# Patient Record
Sex: Female | Born: 1950 | Race: White | Hispanic: No | Marital: Married | Smoking: Never smoker
Health system: Southern US, Community
[De-identification: ages and names within clinical notes are randomized; demographics above are authoritative.]

## PROBLEM LIST (undated history)

## (undated) DIAGNOSIS — Z800 Family history of malignant neoplasm of digestive organs: Secondary | ICD-10-CM

## (undated) DIAGNOSIS — K5900 Constipation, unspecified: Secondary | ICD-10-CM

## (undated) DIAGNOSIS — J45909 Unspecified asthma, uncomplicated: Secondary | ICD-10-CM

## (undated) DIAGNOSIS — Z808 Family history of malignant neoplasm of other organs or systems: Secondary | ICD-10-CM

## (undated) DIAGNOSIS — E349 Endocrine disorder, unspecified: Secondary | ICD-10-CM

## (undated) DIAGNOSIS — Z1211 Encounter for screening for malignant neoplasm of colon: Secondary | ICD-10-CM

## (undated) DIAGNOSIS — E785 Hyperlipidemia, unspecified: Secondary | ICD-10-CM

## (undated) DIAGNOSIS — K219 Gastro-esophageal reflux disease without esophagitis: Secondary | ICD-10-CM

## (undated) DIAGNOSIS — R7989 Other specified abnormal findings of blood chemistry: Secondary | ICD-10-CM

## (undated) DIAGNOSIS — E669 Obesity, unspecified: Secondary | ICD-10-CM

## (undated) DIAGNOSIS — Z8719 Personal history of other diseases of the digestive system: Secondary | ICD-10-CM

## (undated) DIAGNOSIS — E559 Vitamin D deficiency, unspecified: Secondary | ICD-10-CM

## (undated) DIAGNOSIS — Z9221 Personal history of antineoplastic chemotherapy: Secondary | ICD-10-CM

## (undated) DIAGNOSIS — R112 Nausea with vomiting, unspecified: Secondary | ICD-10-CM

## (undated) DIAGNOSIS — J449 Chronic obstructive pulmonary disease, unspecified: Secondary | ICD-10-CM

## (undated) DIAGNOSIS — C801 Malignant (primary) neoplasm, unspecified: Secondary | ICD-10-CM

## (undated) DIAGNOSIS — M1990 Unspecified osteoarthritis, unspecified site: Secondary | ICD-10-CM

## (undated) DIAGNOSIS — F5104 Psychophysiologic insomnia: Secondary | ICD-10-CM

## (undated) DIAGNOSIS — C419 Malignant neoplasm of bone and articular cartilage, unspecified: Secondary | ICD-10-CM

## (undated) DIAGNOSIS — M549 Dorsalgia, unspecified: Secondary | ICD-10-CM

## (undated) DIAGNOSIS — Z8711 Personal history of peptic ulcer disease: Secondary | ICD-10-CM

## (undated) DIAGNOSIS — R945 Abnormal results of liver function studies: Secondary | ICD-10-CM

## (undated) DIAGNOSIS — Z801 Family history of malignant neoplasm of trachea, bronchus and lung: Secondary | ICD-10-CM

## (undated) DIAGNOSIS — R1032 Left lower quadrant pain: Secondary | ICD-10-CM

## (undated) DIAGNOSIS — K811 Chronic cholecystitis: Secondary | ICD-10-CM

## (undated) DIAGNOSIS — Z9889 Other specified postprocedural states: Secondary | ICD-10-CM

## (undated) DIAGNOSIS — Z8701 Personal history of pneumonia (recurrent): Secondary | ICD-10-CM

## (undated) DIAGNOSIS — I10 Essential (primary) hypertension: Secondary | ICD-10-CM

## (undated) DIAGNOSIS — E119 Type 2 diabetes mellitus without complications: Secondary | ICD-10-CM

## (undated) DIAGNOSIS — M5030 Other cervical disc degeneration, unspecified cervical region: Secondary | ICD-10-CM

## (undated) DIAGNOSIS — D649 Anemia, unspecified: Secondary | ICD-10-CM

## (undated) DIAGNOSIS — K5792 Diverticulitis of intestine, part unspecified, without perforation or abscess without bleeding: Secondary | ICD-10-CM

## (undated) DIAGNOSIS — J189 Pneumonia, unspecified organism: Secondary | ICD-10-CM

## (undated) HISTORY — PX: OTHER SURGICAL HISTORY: SHX169

## (undated) HISTORY — DX: Type 2 diabetes mellitus without complications: E11.9

## (undated) HISTORY — DX: Left lower quadrant pain: R10.32

## (undated) HISTORY — DX: Obesity, unspecified: E66.9

## (undated) HISTORY — DX: Nausea with vomiting, unspecified: R11.2

## (undated) HISTORY — DX: Family history of malignant neoplasm of digestive organs: Z80.0

## (undated) HISTORY — PX: HAND SURGERY: SHX662

## (undated) HISTORY — DX: Endocrine disorder, unspecified: E34.9

## (undated) HISTORY — PX: NASAL SINUS SURGERY: SHX719

## (undated) HISTORY — PX: BREAST EXCISIONAL BIOPSY: SUR124

## (undated) HISTORY — DX: Dorsalgia, unspecified: M54.9

## (undated) HISTORY — DX: Family history of malignant neoplasm of other organs or systems: Z80.8

## (undated) HISTORY — DX: Chronic cholecystitis: K81.1

## (undated) HISTORY — DX: Hyperlipidemia, unspecified: E78.5

## (undated) HISTORY — DX: Encounter for screening for malignant neoplasm of colon: Z12.11

## (undated) HISTORY — PX: MASTECTOMY: SHX3

## (undated) HISTORY — DX: Gastro-esophageal reflux disease without esophagitis: K21.9

## (undated) HISTORY — PX: BILATERAL CARPAL TUNNEL RELEASE: SHX6508

## (undated) HISTORY — DX: Essential (primary) hypertension: I10

## (undated) HISTORY — PX: TONSILLECTOMY: SUR1361

## (undated) HISTORY — DX: Family history of malignant neoplasm of trachea, bronchus and lung: Z80.1

## (undated) HISTORY — PX: JOINT REPLACEMENT: SHX530

## (undated) HISTORY — DX: Constipation, unspecified: K59.00

## (undated) HISTORY — PX: COLON SURGERY: SHX602

## (undated) HISTORY — DX: Malignant neoplasm of bone and articular cartilage, unspecified: C41.9

---

## 1976-01-19 HISTORY — PX: TUBAL LIGATION: SHX77

## 1985-01-18 HISTORY — PX: VAGINAL HYSTERECTOMY: SUR661

## 1996-01-19 HISTORY — PX: SALPINGOOPHORECTOMY: SHX82

## 2000-01-19 DIAGNOSIS — I10 Essential (primary) hypertension: Secondary | ICD-10-CM

## 2000-01-19 HISTORY — DX: Essential (primary) hypertension: I10

## 2001-01-18 HISTORY — PX: BUNIONECTOMY: SHX129

## 2002-01-18 HISTORY — PX: COLOSTOMY REVERSAL: SHX5782

## 2002-01-18 HISTORY — PX: COLOSTOMY: SHX63

## 2002-01-18 HISTORY — PX: COLON RESECTION: SHX5231

## 2003-01-19 DIAGNOSIS — E349 Endocrine disorder, unspecified: Secondary | ICD-10-CM

## 2003-01-19 DIAGNOSIS — K5900 Constipation, unspecified: Secondary | ICD-10-CM

## 2003-01-19 DIAGNOSIS — M549 Dorsalgia, unspecified: Secondary | ICD-10-CM

## 2003-01-19 HISTORY — DX: Dorsalgia, unspecified: M54.9

## 2003-01-19 HISTORY — DX: Constipation, unspecified: K59.00

## 2003-01-19 HISTORY — PX: COLONOSCOPY: SHX174

## 2003-01-19 HISTORY — PX: HERNIA REPAIR: SHX51

## 2003-01-19 HISTORY — DX: Endocrine disorder, unspecified: E34.9

## 2003-06-13 ENCOUNTER — Other Ambulatory Visit: Payer: Self-pay

## 2003-12-04 ENCOUNTER — Ambulatory Visit: Payer: Self-pay | Admitting: General Surgery

## 2004-01-16 ENCOUNTER — Ambulatory Visit: Payer: Self-pay | Admitting: General Surgery

## 2005-02-06 ENCOUNTER — Emergency Department: Payer: Self-pay | Admitting: Emergency Medicine

## 2005-02-09 ENCOUNTER — Inpatient Hospital Stay: Payer: Self-pay | Admitting: Endocrinology

## 2005-03-11 ENCOUNTER — Ambulatory Visit: Payer: Self-pay | Admitting: General Surgery

## 2005-03-12 ENCOUNTER — Ambulatory Visit: Payer: Self-pay | Admitting: General Surgery

## 2005-03-31 ENCOUNTER — Ambulatory Visit: Payer: Self-pay | Admitting: Endocrinology

## 2005-04-13 ENCOUNTER — Ambulatory Visit: Payer: Self-pay | Admitting: General Surgery

## 2006-06-01 ENCOUNTER — Ambulatory Visit: Payer: Self-pay | Admitting: Endocrinology

## 2007-06-20 ENCOUNTER — Ambulatory Visit: Payer: Self-pay | Admitting: Endocrinology

## 2008-06-25 ENCOUNTER — Ambulatory Visit: Payer: Self-pay | Admitting: Endocrinology

## 2009-07-09 ENCOUNTER — Ambulatory Visit: Payer: Self-pay | Admitting: Endocrinology

## 2010-01-18 DIAGNOSIS — E785 Hyperlipidemia, unspecified: Secondary | ICD-10-CM

## 2010-01-18 DIAGNOSIS — K811 Chronic cholecystitis: Secondary | ICD-10-CM

## 2010-01-18 DIAGNOSIS — K219 Gastro-esophageal reflux disease without esophagitis: Secondary | ICD-10-CM

## 2010-01-18 DIAGNOSIS — R112 Nausea with vomiting, unspecified: Secondary | ICD-10-CM

## 2010-01-18 DIAGNOSIS — Z1211 Encounter for screening for malignant neoplasm of colon: Secondary | ICD-10-CM

## 2010-01-18 DIAGNOSIS — E669 Obesity, unspecified: Secondary | ICD-10-CM

## 2010-01-18 DIAGNOSIS — R1032 Left lower quadrant pain: Secondary | ICD-10-CM

## 2010-01-18 HISTORY — DX: Left lower quadrant pain: R10.32

## 2010-01-18 HISTORY — DX: Chronic cholecystitis: K81.1

## 2010-01-18 HISTORY — DX: Encounter for screening for malignant neoplasm of colon: Z12.11

## 2010-01-18 HISTORY — DX: Gastro-esophageal reflux disease without esophagitis: K21.9

## 2010-01-18 HISTORY — PX: FOOT SURGERY: SHX648

## 2010-01-18 HISTORY — DX: Hyperlipidemia, unspecified: E78.5

## 2010-01-18 HISTORY — DX: Obesity, unspecified: E66.9

## 2010-01-18 HISTORY — DX: Nausea with vomiting, unspecified: R11.2

## 2010-04-16 ENCOUNTER — Ambulatory Visit: Payer: Self-pay | Admitting: General Surgery

## 2010-04-23 ENCOUNTER — Ambulatory Visit: Payer: Self-pay | Admitting: General Surgery

## 2010-05-08 ENCOUNTER — Ambulatory Visit: Payer: Self-pay | Admitting: General Surgery

## 2010-06-04 ENCOUNTER — Ambulatory Visit: Payer: Self-pay | Admitting: General Surgery

## 2010-06-04 HISTORY — PX: CHOLECYSTECTOMY: SHX55

## 2010-07-23 ENCOUNTER — Ambulatory Visit: Payer: Self-pay | Admitting: Endocrinology

## 2011-08-04 ENCOUNTER — Ambulatory Visit: Payer: Self-pay | Admitting: Endocrinology

## 2012-04-21 ENCOUNTER — Encounter: Payer: Self-pay | Admitting: *Deleted

## 2012-04-26 ENCOUNTER — Encounter: Payer: Self-pay | Admitting: General Surgery

## 2012-04-26 ENCOUNTER — Ambulatory Visit (INDEPENDENT_AMBULATORY_CARE_PROVIDER_SITE_OTHER): Payer: 59 | Admitting: General Surgery

## 2012-04-26 VITALS — BP 118/84 | HR 60 | Resp 14 | Ht 66.0 in | Wt 248.0 lb

## 2012-04-26 DIAGNOSIS — Z1211 Encounter for screening for malignant neoplasm of colon: Secondary | ICD-10-CM

## 2012-04-26 MED ORDER — POLYETHYLENE GLYCOL 3350 17 GM/SCOOP PO POWD: ORAL | Status: DC

## 2012-04-26 NOTE — Progress Notes (Signed)
Patient ID: [name], female   DOB: Dec 08, 1950, 62 y.o.   MRN: [id number]  Chief Complaint  Patient presents with  . Other    due for colonoscopy    HPI Megan Harris is a 62 y.o. female who presents for a colonoscopy. The last colonoscopy was performed 10 years ago. She states no problems at this time.  HPI  Past Medical History  Diagnosis Date  . Constipation 2005  . Diabetes mellitus without complication 2007    type 2  . Hypertension 2002  . Chronic cholecystitis 2012  . Endocrine problem 2005  . GERD (gastroesophageal reflux disease) 2012  . Thyroid disease 2005  . Nausea with vomiting 2012  . Breast screening, unspecified 2012  . Special screening for malignant neoplasms, colon 2012  . Obesity, unspecified 2012  . Abdominal pain, left lower quadrant 2012  . Hyperlipidemia 2012  . Back pain 2005    Past Surgical History  Procedure Laterality Date  . Cholecystectomy  06/04/2010  . Foot surgery Bilateral 2012    plantar faciatis  . Hernia repair  2005    at colostomy site after reversal done  . Colostomy  2004  . Colostomy reversal  2004  . Colon resection  2004  . Tonsillectomy    . Salpingoophorectomy  1998  . Hand surgery    . Nasal sinus surgery      2005  . Abdominal hysterectomy  1977  . Bunionectomy Bilateral 2003  . Colonoscopy  2005    [facility], Dr. Lemar Livings    History reviewed. No pertinent family history.  Social History History  Substance Use Topics  . Smoking status: Never Smoker   . Smokeless tobacco: Not on file  . Alcohol Use: Yes     Comment: socially    Allergies  Allergen Reactions  . Codeine Other (See Comments)    Breathing problems  . Penicillins Hives    Current Outpatient Prescriptions  Medication Sig Dispense Refill  . lansoprazole (PREVACID) 30 MG capsule Take 30 mg by mouth 2 (two) times daily.       Marland Kitchen lisinopril-hydrochlorothiazide (PRINZIDE,ZESTORETIC) 20-12.5 MG per tablet Take 1 tablet by mouth daily.      .  metFORMIN (GLUCOPHAGE) 1000 MG tablet Take 1,000 mg by mouth daily.      . polyethylene glycol powder (GLYCOLAX/MIRALAX) powder 255 grams one bottle for colonoscopy prep  255 g  0  . Vitamin D, Ergocalciferol, (DRISDOL) 50000 UNITS CAPS Take 1 capsule by mouth once a week.      . zolpidem (AMBIEN) 10 MG tablet Take 10 mg by mouth at bedtime as needed for sleep.       No current facility-administered medications for this visit.    Review of Systems Review of Systems  Constitutional: Negative.   Respiratory: Negative.   Cardiovascular: Negative.   Gastrointestinal: Negative.     Blood pressure 118/84, pulse 60, resp. rate 14, height 5\' 6"  (1.676 m), weight 248 lb (112.492 kg).  Physical Exam Physical Exam  Constitutional: She appears well-developed and well-nourished.  Neck: Trachea normal. No mass and no thyromegaly present.  Cardiovascular: Normal rate, regular rhythm, normal heart sounds and normal pulses.   No murmur heard. Pulmonary/Chest: Effort normal and breath sounds normal.  Abdominal: Soft. Normal appearance and bowel sounds are normal. There is tenderness. A hernia is present.  Left mid abdomen tenderness    Data Reviewed The January 2004 colonoscopy showed a few smallmouth diverticula in the sigmoid colon as well  as a full bladder of congested mucosa.  Assessment    Screening colonoscopy is indicated to    Plan    The procedure was reviewed with the patient. This will be scheduled at a convenient date.       Earline Mayotte 04/27/2012, 4:04 PM

## 2012-04-26 NOTE — Patient Instructions (Addendum)
Patient to have screening colonoscopy. This patient has been asked to hold metformin day of colonoscopy prep and procedure. Also, she will discontinue fish oil one week prior to procedure. Miralax prescription has been sent to patient's pharmacy. She will be contacted once June 2014 schedule is available.

## 2012-04-27 ENCOUNTER — Encounter: Payer: Self-pay | Admitting: General Surgery

## 2012-04-27 DIAGNOSIS — Z1211 Encounter for screening for malignant neoplasm of colon: Secondary | ICD-10-CM | POA: Insufficient documentation

## 2012-05-03 ENCOUNTER — Telehealth: Payer: Self-pay | Admitting: *Deleted

## 2012-05-03 NOTE — Telephone Encounter (Signed)
Patient was contacted today to arrange for a colonoscopy. This has been arranged at [facility] for 06-28-12. Patient was reminded to hold metformin day of colonoscopy prep and procedure. Also, patient instructed to discontinue fish oil one week prior. She will be contacted prior to procedure to verify no medication changes.

## 2012-06-07 ENCOUNTER — Other Ambulatory Visit: Payer: Self-pay | Admitting: General Surgery

## 2012-06-07 DIAGNOSIS — Z1211 Encounter for screening for malignant neoplasm of colon: Secondary | ICD-10-CM

## 2012-06-16 ENCOUNTER — Telehealth: Payer: Self-pay | Admitting: *Deleted

## 2012-06-16 NOTE — Telephone Encounter (Signed)
Patient called back to report she is on the same medications since last office visit. She will also pre-register and was planning on doing that today. This patient does report that she is having nausea and pain in her upper mid stomach below breast and above navel. She reports this is similar to the pain she was having when she had her gallbladder. She states food does not trigger the pain. She has no fever and no vomiting. The pain is lingering and area is tender. I mentioned to her Dr. Lemar Livings may want to do an upper endoscopy but would clarify with him. If an upper endoscopy is needed at time of colonoscopy we would have to reschedule her to a different date as time does not allow with [facility] schedule  Patient reminded to hold metformin day of colonoscopy prep and procedure. Also, she will discontinue fish oil one week prior.

## 2012-06-16 NOTE — Telephone Encounter (Signed)
Patient has been left a message to call the office.   We need to verify that she has had no medication changes since last office visit. Also, remind patient to pre-register no later than Friday, 06-23-12, if she has not done so already. Colonoscopy is scheduled for 06-28-12 at [facility].   Patient will need to hold metformin day of colonoscopy prep and procedure. Also, she will need to discontinue fish oil one week prior.

## 2012-06-19 ENCOUNTER — Telehealth: Payer: Self-pay | Admitting: General Surgery

## 2012-06-19 DIAGNOSIS — R110 Nausea: Secondary | ICD-10-CM | POA: Insufficient documentation

## 2012-06-19 NOTE — Telephone Encounter (Signed)
The patient had been contacted regards to her upcoming 06/28/2012 colonoscopy. She reports that she has had intermittent episodes of epigastric pain and nausea unrelated to diet or activity. No nocturnal episodes. No vomiting. She described the symptoms as those similar to what she experienced prior to her cholecystectomy in May 2012.  Her symptoms are not so severe as to cause her to discontinue activities, but have been frustrating with nausea.   We'll plan for an upper endoscopy at the time of her scheduled colonoscopy on June 11.

## 2012-06-28 ENCOUNTER — Ambulatory Visit: Payer: Self-pay | Admitting: General Surgery

## 2012-06-28 DIAGNOSIS — Z1211 Encounter for screening for malignant neoplasm of colon: Secondary | ICD-10-CM

## 2012-06-28 DIAGNOSIS — K2100 Gastro-esophageal reflux disease with esophagitis, without bleeding: Secondary | ICD-10-CM

## 2012-06-28 HISTORY — PX: COLONOSCOPY: SHX174

## 2012-06-28 HISTORY — PX: UPPER GI ENDOSCOPY: SHX6162

## 2012-06-29 ENCOUNTER — Encounter: Payer: Self-pay | Admitting: General Surgery

## 2012-09-19 ENCOUNTER — Ambulatory Visit: Payer: Self-pay | Admitting: Endocrinology

## 2013-01-15 ENCOUNTER — Other Ambulatory Visit: Payer: Self-pay | Admitting: Podiatry

## 2013-10-25 ENCOUNTER — Ambulatory Visit: Payer: Self-pay | Admitting: Internal Medicine

## 2014-08-19 ENCOUNTER — Other Ambulatory Visit: Payer: Self-pay | Admitting: Internal Medicine

## 2014-08-19 DIAGNOSIS — M546 Pain in thoracic spine: Secondary | ICD-10-CM

## 2014-08-22 ENCOUNTER — Ambulatory Visit
Admission: RE | Admit: 2014-08-22 | Discharge: 2014-08-22 | Disposition: A | Discharge status: Home / Self Care | Payer: Medicare PPO | Source: Ambulatory Visit | Attending: Internal Medicine | Admitting: Internal Medicine

## 2014-08-22 DIAGNOSIS — N281 Cyst of kidney, acquired: Secondary | ICD-10-CM | POA: Insufficient documentation

## 2014-08-22 DIAGNOSIS — M546 Pain in thoracic spine: Secondary | ICD-10-CM | POA: Diagnosis present

## 2014-10-23 ENCOUNTER — Other Ambulatory Visit: Payer: Self-pay | Admitting: Physician Assistant

## 2014-10-23 DIAGNOSIS — M2391 Unspecified internal derangement of right knee: Secondary | ICD-10-CM

## 2014-10-29 ENCOUNTER — Ambulatory Visit
Admission: RE | Admit: 2014-10-29 | Discharge: 2014-10-29 | Disposition: A | Discharge status: Home / Self Care | Payer: Medicare PPO | Source: Ambulatory Visit | Attending: Physician Assistant | Admitting: Physician Assistant

## 2014-10-29 DIAGNOSIS — S83241A Other tear of medial meniscus, current injury, right knee, initial encounter: Secondary | ICD-10-CM | POA: Diagnosis not present

## 2014-10-29 DIAGNOSIS — M2391 Unspecified internal derangement of right knee: Secondary | ICD-10-CM | POA: Diagnosis present

## 2014-10-29 DIAGNOSIS — X58XXXA Exposure to other specified factors, initial encounter: Secondary | ICD-10-CM | POA: Insufficient documentation

## 2015-01-15 ENCOUNTER — Other Ambulatory Visit: Payer: Self-pay | Admitting: Internal Medicine

## 2015-01-15 DIAGNOSIS — Z1231 Encounter for screening mammogram for malignant neoplasm of breast: Secondary | ICD-10-CM

## 2015-01-21 ENCOUNTER — Encounter
Admission: RE | Admit: 2015-01-21 | Discharge: 2015-01-21 | Disposition: A | Discharge status: Home / Self Care | Payer: PPO | Source: Ambulatory Visit | Attending: Orthopedic Surgery | Admitting: Orthopedic Surgery

## 2015-01-21 DIAGNOSIS — Z01810 Encounter for preprocedural cardiovascular examination: Secondary | ICD-10-CM | POA: Insufficient documentation

## 2015-01-21 DIAGNOSIS — I10 Essential (primary) hypertension: Secondary | ICD-10-CM | POA: Diagnosis not present

## 2015-01-21 HISTORY — DX: Unspecified asthma, uncomplicated: J45.909

## 2015-01-21 NOTE — Patient Instructions (Signed)
  Your procedure is scheduled [id number] 02/03/2015 Report to Day Surgery. 2ND FLOOR MEDICAL MALL ENTRANCE To find out your arrival time please call [phone] between 1PM - 3PM on Friday 01/31/2015.  Remember: Instructions that are not followed completely may result in serious medical risk, up to and including death, or upon the discretion of your surgeon and anesthesiologist your surgery may need to be rescheduled.    __X__ 1. Do not eat food or drink liquids after midnight. No gum chewing or hard candies.     __X__ 2. No Alcohol for 24 hours before or after surgery.   ____ 3. Bring all medications with you on the day of surgery if instructed.    __X__ 4. Notify your doctor if there is any change in your medical condition     (cold, fever, infections).     Do not wear jewelry, make-up, hairpins, clips or nail polish.  Do not wear lotions, powders, or perfumes.   Do not shave 48 hours prior to surgery. Men may shave face and neck.  Do not bring valuables to the hospital.    [facility] is not responsible for any belongings or valuables.               Contacts, dentures or bridgework may not be worn into surgery.  Leave your suitcase in the car. After surgery it may be brought to your room.  For patients admitted to the hospital, discharge time is determined by your                treatment team.   Patients discharged the day of surgery will not be allowed to drive home.   Please read over the following fact sheets that you were given:   Surgical Site Infection Prevention   __X__ Take these medicines the morning of surgery with A SIP OF WATER:    1. lansoprazole (PREVACID) 30 MG capsule    atorvastatin (LIPITOR) 10 MG tablet   2.   3.   4.  5.  6.  ____ Fleet Enema (as directed)   __X__ Use CHG Soap as directed  ____ Use inhalers on the day of surgery  ____ Stop metformin 2 days prior to surgery    ____ Take 1/2 of usual insulin dose the night before surgery and none  on the morning of surgery.   __X__ Stop Coumadin/Plavix/aspirin on STOP 10 DAYS PRIOR TO SURGERY  ____ Stop Anti-inflammatories on    __X__ Stop supplements until after surgery.    ____ Bring C-Pap to the hospital.

## 2015-01-22 NOTE — Pre-Procedure Instructions (Signed)
AS INSTRUCTED BY DR Rosey Bath, REQUEST FOR CLEARANCE AND EKG CALLED AND FAXED TO DR HOOTEN . SPOKE WITH HOPE

## 2015-01-23 NOTE — Pre-Procedure Instructions (Signed)
[place]

## 2015-01-31 ENCOUNTER — Ambulatory Visit
Admission: RE | Admit: 2015-01-31 | Discharge: 2015-01-31 | Disposition: A | Discharge status: Home / Self Care | Payer: PPO | Source: Ambulatory Visit | Attending: Internal Medicine | Admitting: Internal Medicine

## 2015-01-31 DIAGNOSIS — Z1231 Encounter for screening mammogram for malignant neoplasm of breast: Secondary | ICD-10-CM

## 2015-02-03 ENCOUNTER — Ambulatory Visit: Payer: PPO | Admitting: Certified Registered Nurse Anesthetist

## 2015-02-03 ENCOUNTER — Ambulatory Visit
Admission: RE | Admit: 2015-02-03 | Discharge: 2015-02-03 | Disposition: A | Discharge status: Home / Self Care | Payer: PPO | Source: Ambulatory Visit | Attending: Orthopedic Surgery | Admitting: Orthopedic Surgery

## 2015-02-03 ENCOUNTER — Encounter
Admission: RE | Disposition: A | Discharge status: Home / Self Care | Payer: Self-pay | Source: Ambulatory Visit | Attending: Orthopedic Surgery

## 2015-02-03 DIAGNOSIS — E119 Type 2 diabetes mellitus without complications: Secondary | ICD-10-CM | POA: Insufficient documentation

## 2015-02-03 DIAGNOSIS — I10 Essential (primary) hypertension: Secondary | ICD-10-CM | POA: Insufficient documentation

## 2015-02-03 DIAGNOSIS — J449 Chronic obstructive pulmonary disease, unspecified: Secondary | ICD-10-CM | POA: Insufficient documentation

## 2015-02-03 DIAGNOSIS — Z79899 Other long term (current) drug therapy: Secondary | ICD-10-CM | POA: Diagnosis not present

## 2015-02-03 DIAGNOSIS — Z880 Allergy status to penicillin: Secondary | ICD-10-CM | POA: Diagnosis not present

## 2015-02-03 DIAGNOSIS — E559 Vitamin D deficiency, unspecified: Secondary | ICD-10-CM | POA: Diagnosis not present

## 2015-02-03 DIAGNOSIS — M25561 Pain in right knee: Secondary | ICD-10-CM | POA: Insufficient documentation

## 2015-02-03 DIAGNOSIS — M94261 Chondromalacia, right knee: Secondary | ICD-10-CM | POA: Insufficient documentation

## 2015-02-03 DIAGNOSIS — Z9049 Acquired absence of other specified parts of digestive tract: Secondary | ICD-10-CM | POA: Insufficient documentation

## 2015-02-03 DIAGNOSIS — J45909 Unspecified asthma, uncomplicated: Secondary | ICD-10-CM | POA: Diagnosis not present

## 2015-02-03 DIAGNOSIS — Z90710 Acquired absence of both cervix and uterus: Secondary | ICD-10-CM | POA: Insufficient documentation

## 2015-02-03 DIAGNOSIS — M2391 Unspecified internal derangement of right knee: Secondary | ICD-10-CM | POA: Diagnosis not present

## 2015-02-03 DIAGNOSIS — Z885 Allergy status to narcotic agent status: Secondary | ICD-10-CM | POA: Diagnosis not present

## 2015-02-03 DIAGNOSIS — R7989 Other specified abnormal findings of blood chemistry: Secondary | ICD-10-CM | POA: Insufficient documentation

## 2015-02-03 DIAGNOSIS — M23221 Derangement of posterior horn of medial meniscus due to old tear or injury, right knee: Secondary | ICD-10-CM | POA: Insufficient documentation

## 2015-02-03 DIAGNOSIS — M5030 Other cervical disc degeneration, unspecified cervical region: Secondary | ICD-10-CM | POA: Insufficient documentation

## 2015-02-03 DIAGNOSIS — M2241 Chondromalacia patellae, right knee: Secondary | ICD-10-CM | POA: Diagnosis not present

## 2015-02-03 DIAGNOSIS — F5104 Psychophysiologic insomnia: Secondary | ICD-10-CM | POA: Insufficient documentation

## 2015-02-03 DIAGNOSIS — Z91048 Other nonmedicinal substance allergy status: Secondary | ICD-10-CM | POA: Diagnosis not present

## 2015-02-03 DIAGNOSIS — Z8711 Personal history of peptic ulcer disease: Secondary | ICD-10-CM | POA: Diagnosis not present

## 2015-02-03 DIAGNOSIS — E785 Hyperlipidemia, unspecified: Secondary | ICD-10-CM | POA: Diagnosis not present

## 2015-02-03 HISTORY — DX: Unspecified osteoarthritis, unspecified site: M19.90

## 2015-02-03 HISTORY — PX: KNEE ARTHROSCOPY: SHX127

## 2015-02-03 SURGERY — ARTHROSCOPY, KNEE
Anesthesia: General | Site: Knee | Laterality: Right | Wound class: Clean

## 2015-02-03 MED ORDER — DEXAMETHASONE SODIUM PHOSPHATE 10 MG/ML IJ SOLN
INTRAMUSCULAR | Status: DC | PRN
  Administered 2015-02-03: 10 mg via INTRAVENOUS

## 2015-02-03 MED ORDER — ACETAMINOPHEN 10 MG/ML IV SOLN
INTRAVENOUS | Status: AC
  Filled 2015-02-03: qty 100

## 2015-02-03 MED ORDER — HYDROCODONE-ACETAMINOPHEN 5-325 MG PO TABS: 1.0000 | ORAL_TABLET | ORAL | Status: DC | PRN

## 2015-02-03 MED ORDER — PHENYLEPHRINE HCL 10 MG/ML IJ SOLN
INTRAMUSCULAR | Status: DC | PRN
  Administered 2015-02-03: 100 ug via INTRAVENOUS
  Administered 2015-02-03: 200 ug via INTRAVENOUS

## 2015-02-03 MED ORDER — ONDANSETRON HCL 4 MG/2ML IJ SOLN
INTRAMUSCULAR | Status: DC | PRN
  Administered 2015-02-03: 4 mg via INTRAVENOUS

## 2015-02-03 MED ORDER — EPHEDRINE SULFATE 50 MG/ML IJ SOLN
INTRAMUSCULAR | Status: DC | PRN
  Administered 2015-02-03: 5 mg via INTRAVENOUS
  Administered 2015-02-03: 10 mg via INTRAVENOUS

## 2015-02-03 MED ORDER — FENTANYL CITRATE (PF) 100 MCG/2ML IJ SOLN
25.0000 ug | INTRAMUSCULAR | Status: DC | PRN
  Administered 2015-02-03: 25 ug via INTRAVENOUS
  Administered 2015-02-03: 50 ug via INTRAVENOUS
  Administered 2015-02-03: 25 ug via INTRAVENOUS

## 2015-02-03 MED ORDER — SODIUM CHLORIDE 0.9 % IV SOLN
INTRAVENOUS | Status: DC
  Administered 2015-02-03: 15:00:00 via INTRAVENOUS

## 2015-02-03 MED ORDER — LIDOCAINE HCL (CARDIAC) 20 MG/ML IV SOLN
INTRAVENOUS | Status: DC | PRN
  Administered 2015-02-03: 100 mg via INTRAVENOUS

## 2015-02-03 MED ORDER — ACETAMINOPHEN 10 MG/ML IV SOLN
INTRAVENOUS | Status: DC | PRN
  Administered 2015-02-03: 1000 mg via INTRAVENOUS

## 2015-02-03 MED ORDER — BUPIVACAINE-EPINEPHRINE 0.25% -1:200000 IJ SOLN
INTRAMUSCULAR | Status: DC | PRN
  Administered 2015-02-03: 5 mL
  Administered 2015-02-03: 30 mL

## 2015-02-03 MED ORDER — FENTANYL CITRATE (PF) 100 MCG/2ML IJ SOLN
INTRAMUSCULAR | Status: DC | PRN
  Administered 2015-02-03 (×5): 25 ug via INTRAVENOUS

## 2015-02-03 MED ORDER — MORPHINE SULFATE (PF) 4 MG/ML IV SOLN
INTRAVENOUS | Status: AC
  Filled 2015-02-03: qty 1

## 2015-02-03 MED ORDER — MIDAZOLAM HCL 2 MG/2ML IJ SOLN
INTRAMUSCULAR | Status: DC | PRN
Start: 2015-02-03 — End: 2015-02-03
  Administered 2015-02-03: 2 mg via INTRAVENOUS

## 2015-02-03 MED ORDER — FENTANYL CITRATE (PF) 100 MCG/2ML IJ SOLN
INTRAMUSCULAR | Status: AC
  Filled 2015-02-03: qty 2

## 2015-02-03 MED ORDER — PROMETHAZINE HCL 25 MG/ML IJ SOLN
INTRAMUSCULAR | Status: AC
  Administered 2015-02-03: 25 mg
  Filled 2015-02-03: qty 1

## 2015-02-03 MED ORDER — PROPOFOL 10 MG/ML IV BOLUS
INTRAVENOUS | Status: DC | PRN
  Administered 2015-02-03: 200 mg via INTRAVENOUS

## 2015-02-03 SURGICAL SUPPLY — 23 items
BLADE SHAVER 4.5 DBL SERAT CV (CUTTER) ×2 IMPLANT
BNDG ESMARK 6X12 TAN STRL LF (GAUZE/BANDAGES/DRESSINGS) ×2 IMPLANT
DRSG DERMACEA 8X12 NADH (GAUZE/BANDAGES/DRESSINGS) ×2 IMPLANT
DURAPREP 26ML APPLICATOR (WOUND CARE) ×4 IMPLANT
GAUZE SPONGE 4X4 12PLY STRL (GAUZE/BANDAGES/DRESSINGS) ×4 IMPLANT
GLOVE BIOGEL M STRL SZ7.5 (GLOVE) ×2 IMPLANT
GLOVE INDICATOR 8.0 STRL GRN (GLOVE) ×2 IMPLANT
GOWN STRL REUS W/ TWL LRG LVL3 (GOWN DISPOSABLE) ×1 IMPLANT
GOWN STRL REUS W/ TWL LRG LVL4 (GOWN DISPOSABLE) ×1 IMPLANT
GOWN STRL REUS W/TWL LRG LVL3 (GOWN DISPOSABLE) ×1
GOWN STRL REUS W/TWL LRG LVL4 (GOWN DISPOSABLE) ×1
IV LACTATED RINGER IRRG 3000ML (IV SOLUTION) ×6
IV LR IRRIG 3000ML ARTHROMATIC (IV SOLUTION) ×6 IMPLANT
MANIFOLD NEPTUNE II (INSTRUMENTS) ×2 IMPLANT
PACK ARTHROSCOPY KNEE (MISCELLANEOUS) ×2 IMPLANT
SET TUBE SUCT SHAVER OUTFL 24K (TUBING) ×2 IMPLANT
SET TUBE TIP INTRA-ARTICULAR (MISCELLANEOUS) ×2 IMPLANT
STRAP SAFETY BODY (MISCELLANEOUS) ×2 IMPLANT
SUT ETHILON 3-0 FS-10 30 BLK (SUTURE) ×2
SUTURE EHLN 3-0 FS-10 30 BLK (SUTURE) ×1 IMPLANT
TUBING ARTHRO INFLOW-ONLY STRL (TUBING) ×2 IMPLANT
WAND HAND CNTRL MULTIVAC 50 (MISCELLANEOUS) ×2 IMPLANT
WRAP KNEE W/COLD PACKS 25.5X14 (SOFTGOODS) ×2 IMPLANT

## 2015-02-03 NOTE — OR Nursing (Signed)
Patient in post op c/o nausea; per Dr. Marcello Moores ok for IV phenergan; will continue to monitor.

## 2015-02-03 NOTE — Discharge Instructions (Signed)
°  Instructions after Knee Arthroscopy  ° ° Siani Utke P. Dianey Suchy, Jr., M.D.    ° Dept. of Orthopaedics & Sports Medicine ° [facility] ° [address] ° [place], [place]  [address] ° ° Phone: [phone]   Fax: [phone] ° ° °DIET: °• Drink plenty of non-alcoholic fluids & begin a light diet. °• Resume your normal diet the day after surgery. ° °ACTIVITY:  °• You may use crutches or a walker with weight-bearing as tolerated, unless instructed otherwise. °• You may wean yourself off of the walker or crutches as tolerated.  °• Begin doing gentle exercises. Exercising will reduce the pain and swelling, increase motion, and prevent muscle weakness.   °• Avoid strenuous activities or athletics for a minimum of 4-6 weeks after arthroscopic surgery. °• Do not drive or operate any equipment until instructed. ° °WOUND CARE:  °• Place one to two pillows under the knee the first day or two when sitting or lying.  °• Continue to use the ice packs periodically to reduce pain and swelling. °• The small incisions in your knee are closed with nylon stitches. The stitches will be removed in the office. °• The bulky dressing may be removed on the second day after surgery. DO NOT TOUCH THE STITCHES. Put a Band-Aid over each stitch. Do NOT use any ointments or creams on the incisions.  °• You may bathe or shower after the stitches are removed at the first office visit following surgery. ° °MEDICATIONS: °• You may resume your regular medications. °• Please take the pain medication as prescribed. °• Do not take pain medication on an empty stomach. °• Do not drive or drink alcoholic beverages when taking pain medications. ° °CALL THE OFFICE FOR: °• Temperature above 101 degrees °• Excessive bleeding or drainage on the dressing. °• Excessive swelling, coldness, or paleness of the toes. °• Persistent nausea and vomiting. ° °FOLLOW-UP:  °• You should have an appointment to return to the office in 7-10 days after surgery.  °  °

## 2015-02-03 NOTE — Op Note (Signed)
Patient states she is feeling much better after IV phenergan; IV removed and patient taken out via wheelchair.

## 2015-02-03 NOTE — Transfer of Care (Signed)
Immediate Anesthesia Transfer of Care Note  Patient: Megan Harris  Procedure(s) Performed: Procedure(s): ARTHROSCOPY right knee, partial medial menisectomy, (Right)  Patient Location: PACU  Anesthesia Type:General  Level of Consciousness: awake, alert , oriented and patient cooperative  Airway & Oxygen Therapy: Patient Spontanous Breathing and Patient connected to face mask oxygen  Post-op Assessment: Report given to RN, Post -op Vital signs reviewed and stable and Patient moving all extremities X 4  Post vital signs: Reviewed and stable  Last Vitals:  Filed Vitals:   02/03/15 1418  BP: 156/93  Pulse: 68  Temp: 36.7 C  Resp: 18    Complications: No apparent anesthesia complications

## 2015-02-03 NOTE — Anesthesia Preprocedure Evaluation (Signed)
Anesthesia Evaluation  Patient identified by MRN, date of birth, ID band Patient awake    Reviewed: Allergy & Precautions, H&P , NPO status , Patient's Chart, lab work & pertinent test results  History of Anesthesia Complications Negative for: history of anesthetic complications  Airway Mallampati: II  TM Distance: >3 FB Neck ROM: full    Dental no notable dental hx. (+) Teeth Intact   Pulmonary neg shortness of breath, asthma ,    Pulmonary exam normal breath sounds clear to auscultation       Cardiovascular Exercise Tolerance: Good hypertension, (-) angina(-) Past MI and (-) DOE Normal cardiovascular exam Rhythm:regular Rate:Normal     Neuro/Psych negative neurological ROS  negative psych ROS   GI/Hepatic Neg liver ROS, GERD  Controlled and Medicated,  Endo/Other  Morbid obesity  Renal/GU negative Renal ROS  negative genitourinary   Musculoskeletal  (+) Arthritis ,   Abdominal   Peds  Hematology negative hematology ROS (+)   Anesthesia Other Findings Past Medical History:   Constipation                                    2005         Hypertension                                    2002         Chronic cholecystitis                           2012         Endocrine problem                               2005         GERD (gastroesophageal reflux disease)          2012         Nausea with vomiting                            2012         Breast screening, unspecified                   2012         Special screening for malignant neoplasms, col* 2012         Obesity, unspecified                            2012         Abdominal pain, left lower quadrant             2012         Hyperlipidemia                                  2012         Back pain                                       2005  Asthma                                                       Arthritis                                                    Past Surgical History:   CHOLECYSTECTOMY                                  06/04/2010    FOOT SURGERY                                    Bilateral 2012           Comment:plantar faciatis   HERNIA REPAIR                                    2005           Comment:at colostomy site after reversal done   COLOSTOMY                                        2004         COLOSTOMY REVERSAL                               2004         COLON RESECTION                                  2004         TONSILLECTOMY                                                 SALPINGOOPHORECTOMY                              1998         HAND SURGERY                                                  NASAL SINUS SURGERY                                             Comment:2005   ABDOMINAL HYSTERECTOMY  1977         BUNIONECTOMY                                    Bilateral 2003         COLONOSCOPY                                      2005           Comment:[facility], Dr. Bary Castilla   BREAST BIOPSY                                   Left 15+ yrs a*     Comment:neg  BMI    Body Mass Index   41.10 kg/m 2      Reproductive/Obstetrics negative OB ROS                             Anesthesia Physical Anesthesia Plan  ASA: III  Anesthesia Plan: General LMA   Post-op Pain Management:    Induction:   Airway Management Planned:   Additional Equipment:   Intra-op Plan:   Post-operative Plan:   Informed Consent: I have reviewed the patients History and Physical, chart, labs and discussed the procedure including the risks, benefits and alternatives for the proposed anesthesia with the patient or authorized representative who has indicated his/her understanding and acceptance.   Dental Advisory Given  Plan Discussed with: Anesthesiologist, CRNA and Surgeon  Anesthesia Plan Comments:         Anesthesia Quick Evaluation

## 2015-02-03 NOTE — H&P (Signed)
The patient has been re-examined, and the chart reviewed, and there have been no interval changes to the documented history and physical.    The risks, benefits, and alternatives have been discussed at length. The patient expressed understanding of the risks benefits and agreed with plans for surgical intervention.  Ankit Degregorio P. Branton Einstein, Jr. M.D.    

## 2015-02-03 NOTE — Anesthesia Procedure Notes (Signed)
Procedure Name: LMA Insertion Date/Time: 02/03/2015 3:21 PM Performed by: Silvana Newness Pre-anesthesia Checklist: Patient identified Patient Re-evaluated:Patient Re-evaluated prior to inductionOxygen Delivery Method: Circle system utilized Preoxygenation: Pre-oxygenation with 100% oxygen Intubation Type: IV induction Ventilation: Mask ventilation without difficulty LMA: LMA inserted LMA Size: 3.5 Number of attempts: 1 Placement Confirmation: positive ETCO2 and breath sounds checked- equal and bilateral Tube secured with: Tape Dental Injury: Teeth and Oropharynx as per pre-operative assessment

## 2015-02-03 NOTE — Anesthesia Postprocedure Evaluation (Signed)
Anesthesia Post Note  Patient: Megan Harris  Procedure(s) Performed: Procedure(s) (LRB): ARTHROSCOPY right knee, partial medial menisectomy, condyle malleolus, patella and femoral (Right)  Patient location during evaluation: PACU Anesthesia Type: General Level of consciousness: awake Pain management: pain level controlled Vital Signs Assessment: post-procedure vital signs reviewed and stable Respiratory status: spontaneous breathing Cardiovascular status: blood pressure returned to baseline Anesthetic complications: no    Last Vitals:  Filed Vitals:   02/03/15 1811 02/03/15 1836  BP: 135/86 127/86  Pulse: 89 79  Temp: 36.7 C   Resp: 14 14    Last Pain:  Filed Vitals:   02/03/15 1837  PainSc: [place]

## 2015-02-03 NOTE — Brief Op Note (Signed)
02/03/2015  5:08 PM  PATIENT:  Megan Harris  65 y.o. 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   Delay start of Pharmacological VTE agent (>24hrs) due to surgical blood loss or risk of bleeding: not applicable

## 2015-02-03 NOTE — Op Note (Signed)
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   ASSISTANT: none  ANESTHESIA: general  ESTIMATED BLOOD LOSS: Minimal  FLUIDS REPLACED: 700 mL of crystalloid  TOURNIQUET TIME: Not used   DRAINS: none  IMPLANTS UTILIZED: None  INDICATIONS FOR SURGERY: Megan Harris is a 65 y.o. year old female who has been seen for complaints of right knee pain. MRI demonstrated findings consistent with meniscal pathology. After discussion of the risks and benefits of surgical intervention, the patient expressed understanding of the risks benefits and agree with plans for right knee arthroscopy.   PROCEDURE IN DETAIL: The patient was brought into the operating room and, after adequate general anesthesia was achieved, a tourniquet was applied to the right thigh and the leg was placed in the leg holder. All bony prominences were well padded. The patient's right knee was cleaned and prepped with alcohol and Duraprep and draped in the usual sterile fashion. A "timeout" was performed as per usual protocol. The anticipated portal sites were injected with 0.25% Marcaine with epinephrine. An anterolateral incision was made and a cannula was inserted. A moderate effusion was evacuated and the knee was distended with fluid using the pump. The scope was advanced down the medial gutter into the medial compartment. Under visualization with the scope, an anteromedial portal was created and a hooked probe was inserted. The medial meniscus was visualized and probed. There was a degenerative complex  tear of the posterior horn of the medial meniscus. The tear was debrided using meniscal punches and a 4.5 mm incisor shaver. Final contouring was performed using the 50 ArthroCare wand. The remaining rim of meniscus was visualized and probed and felt to be stable. The articular cartilage was visualized. There was grade 3 chondromalacia with fibrillation of the articular cartilage involving both the medial femoral condyle and less so the medial tibial plateau. These areas were debrided and contoured using the ArthroCare wand.  The scope was then advanced into the intercondylar notch. The anterior cruciate ligament was visualized and probed and felt to be intact. The scope was removed from the lateral portal and reinserted via the anteromedial portal to better visualize the lateral compartment. The lateral meniscus was visualized and probed. The lateral meniscus was stable without evidence of tear. The articular cartilage of the lateral compartment was visualized and felt to be in good condition. Finally, the scope was advanced so as to visualize the patellofemoral articulation. Good patellar tracking was appreciated. There were grade 3 changes of chondromalacia involving the patellofemoral articulation, especially along the intercondylar sulcus.  The knee was irrigated with copius amounts of fluid and suctioned dry. The anterolateral portal was re-approximated with #3-0 nylon. A combination of 0.25% Marcaine with epinephrine and 4 mg of Morphine were injected via the scope. The scope was removed and the anteromedial portal was re-approximated with #3-0 nylon. A sterile dressing was applied followed by application of an ice wrap.  The patient tolerated the procedure well and was transported to the PACU in stable condition.  James P. Holley Bouche., M.D.

## 2015-02-04 ENCOUNTER — Encounter: Payer: Self-pay | Admitting: Orthopedic Surgery

## 2015-04-27 DIAGNOSIS — H109 Unspecified conjunctivitis: Secondary | ICD-10-CM | POA: Diagnosis not present

## 2015-06-03 DIAGNOSIS — I10 Essential (primary) hypertension: Secondary | ICD-10-CM | POA: Diagnosis not present

## 2015-06-03 DIAGNOSIS — N390 Urinary tract infection, site not specified: Secondary | ICD-10-CM | POA: Diagnosis not present

## 2015-06-03 DIAGNOSIS — E119 Type 2 diabetes mellitus without complications: Secondary | ICD-10-CM | POA: Diagnosis not present

## 2015-06-03 DIAGNOSIS — Z1329 Encounter for screening for other suspected endocrine disorder: Secondary | ICD-10-CM | POA: Diagnosis not present

## 2015-06-03 DIAGNOSIS — E7800 Pure hypercholesterolemia, unspecified: Secondary | ICD-10-CM | POA: Diagnosis not present

## 2015-06-03 DIAGNOSIS — E559 Vitamin D deficiency, unspecified: Secondary | ICD-10-CM | POA: Diagnosis not present

## 2015-06-03 DIAGNOSIS — Z79899 Other long term (current) drug therapy: Secondary | ICD-10-CM | POA: Diagnosis not present

## 2015-06-10 DIAGNOSIS — Z0000 Encounter for general adult medical examination without abnormal findings: Secondary | ICD-10-CM | POA: Diagnosis not present

## 2015-06-10 DIAGNOSIS — Z6841 Body Mass Index (BMI) 40.0 and over, adult: Secondary | ICD-10-CM

## 2015-06-10 DIAGNOSIS — M25531 Pain in right wrist: Secondary | ICD-10-CM | POA: Diagnosis not present

## 2015-06-24 DIAGNOSIS — M8580 Other specified disorders of bone density and structure, unspecified site: Secondary | ICD-10-CM | POA: Diagnosis not present

## 2015-08-12 DIAGNOSIS — M25561 Pain in right knee: Secondary | ICD-10-CM | POA: Diagnosis not present

## 2015-08-12 DIAGNOSIS — M1731 Unilateral post-traumatic osteoarthritis, right knee: Secondary | ICD-10-CM | POA: Diagnosis not present

## 2015-10-09 DIAGNOSIS — M1731 Unilateral post-traumatic osteoarthritis, right knee: Secondary | ICD-10-CM | POA: Diagnosis not present

## 2015-10-09 DIAGNOSIS — M25561 Pain in right knee: Secondary | ICD-10-CM | POA: Diagnosis not present

## 2015-10-16 DIAGNOSIS — M1731 Unilateral post-traumatic osteoarthritis, right knee: Secondary | ICD-10-CM | POA: Diagnosis not present

## 2015-10-23 DIAGNOSIS — M1731 Unilateral post-traumatic osteoarthritis, right knee: Secondary | ICD-10-CM | POA: Diagnosis not present

## 2015-12-01 DIAGNOSIS — E559 Vitamin D deficiency, unspecified: Secondary | ICD-10-CM | POA: Diagnosis not present

## 2015-12-01 DIAGNOSIS — E7800 Pure hypercholesterolemia, unspecified: Secondary | ICD-10-CM | POA: Diagnosis not present

## 2015-12-01 DIAGNOSIS — I10 Essential (primary) hypertension: Secondary | ICD-10-CM | POA: Diagnosis not present

## 2015-12-01 DIAGNOSIS — Z79899 Other long term (current) drug therapy: Secondary | ICD-10-CM | POA: Diagnosis not present

## 2015-12-01 DIAGNOSIS — E119 Type 2 diabetes mellitus without complications: Secondary | ICD-10-CM | POA: Diagnosis not present

## 2015-12-02 DIAGNOSIS — R8290 Unspecified abnormal findings in urine: Secondary | ICD-10-CM | POA: Diagnosis not present

## 2015-12-08 DIAGNOSIS — J4520 Mild intermittent asthma, uncomplicated: Secondary | ICD-10-CM | POA: Diagnosis not present

## 2015-12-08 DIAGNOSIS — L659 Nonscarring hair loss, unspecified: Secondary | ICD-10-CM | POA: Diagnosis not present

## 2015-12-08 DIAGNOSIS — N390 Urinary tract infection, site not specified: Secondary | ICD-10-CM | POA: Diagnosis not present

## 2015-12-08 DIAGNOSIS — E7800 Pure hypercholesterolemia, unspecified: Secondary | ICD-10-CM | POA: Diagnosis not present

## 2015-12-08 DIAGNOSIS — I10 Essential (primary) hypertension: Secondary | ICD-10-CM | POA: Diagnosis not present

## 2015-12-08 DIAGNOSIS — Z1231 Encounter for screening mammogram for malignant neoplasm of breast: Secondary | ICD-10-CM | POA: Diagnosis not present

## 2015-12-08 DIAGNOSIS — K219 Gastro-esophageal reflux disease without esophagitis: Secondary | ICD-10-CM | POA: Diagnosis not present

## 2015-12-08 DIAGNOSIS — E559 Vitamin D deficiency, unspecified: Secondary | ICD-10-CM | POA: Diagnosis not present

## 2015-12-08 DIAGNOSIS — R61 Generalized hyperhidrosis: Secondary | ICD-10-CM | POA: Diagnosis not present

## 2015-12-08 DIAGNOSIS — E119 Type 2 diabetes mellitus without complications: Secondary | ICD-10-CM | POA: Diagnosis not present

## 2015-12-08 DIAGNOSIS — Z1329 Encounter for screening for other suspected endocrine disorder: Secondary | ICD-10-CM | POA: Diagnosis not present

## 2016-02-03 ENCOUNTER — Encounter: Payer: Self-pay | Admitting: Obstetrics and Gynecology

## 2016-02-03 DIAGNOSIS — D225 Melanocytic nevi of trunk: Secondary | ICD-10-CM | POA: Diagnosis not present

## 2016-02-03 DIAGNOSIS — L918 Other hypertrophic disorders of the skin: Secondary | ICD-10-CM | POA: Diagnosis not present

## 2016-02-03 DIAGNOSIS — D2261 Melanocytic nevi of right upper limb, including shoulder: Secondary | ICD-10-CM | POA: Diagnosis not present

## 2016-02-03 DIAGNOSIS — L648 Other androgenic alopecia: Secondary | ICD-10-CM | POA: Diagnosis not present

## 2016-02-13 ENCOUNTER — Other Ambulatory Visit: Payer: Self-pay | Admitting: Internal Medicine

## 2016-02-13 DIAGNOSIS — Z1231 Encounter for screening mammogram for malignant neoplasm of breast: Secondary | ICD-10-CM

## 2016-02-17 ENCOUNTER — Ambulatory Visit
Admission: RE | Admit: 2016-02-17 | Discharge: 2016-02-17 | Disposition: A | Discharge status: Home / Self Care | Payer: PPO | Source: Ambulatory Visit | Attending: Internal Medicine | Admitting: Internal Medicine

## 2016-02-17 DIAGNOSIS — Z1231 Encounter for screening mammogram for malignant neoplasm of breast: Secondary | ICD-10-CM | POA: Diagnosis not present

## 2016-02-20 ENCOUNTER — Other Ambulatory Visit: Payer: Self-pay | Admitting: Internal Medicine

## 2016-02-20 DIAGNOSIS — R928 Other abnormal and inconclusive findings on diagnostic imaging of breast: Secondary | ICD-10-CM

## 2016-03-02 ENCOUNTER — Ambulatory Visit (INDEPENDENT_AMBULATORY_CARE_PROVIDER_SITE_OTHER): Payer: PPO | Admitting: Obstetrics and Gynecology

## 2016-03-02 ENCOUNTER — Encounter: Payer: Self-pay | Admitting: Obstetrics and Gynecology

## 2016-03-02 VITALS — BP 145/83 | HR 73 | Ht 65.0 in | Wt 244.0 lb

## 2016-03-02 DIAGNOSIS — R3915 Urgency of urination: Secondary | ICD-10-CM

## 2016-03-02 DIAGNOSIS — E8940 Asymptomatic postprocedural ovarian failure: Secondary | ICD-10-CM | POA: Diagnosis not present

## 2016-03-02 DIAGNOSIS — N3946 Mixed incontinence: Secondary | ICD-10-CM

## 2016-03-02 DIAGNOSIS — Z90722 Acquired absence of ovaries, bilateral: Secondary | ICD-10-CM

## 2016-03-02 DIAGNOSIS — N952 Postmenopausal atrophic vaginitis: Secondary | ICD-10-CM | POA: Diagnosis not present

## 2016-03-02 DIAGNOSIS — N941 Unspecified dyspareunia: Secondary | ICD-10-CM

## 2016-03-02 DIAGNOSIS — Z1211 Encounter for screening for malignant neoplasm of colon: Secondary | ICD-10-CM

## 2016-03-02 DIAGNOSIS — N9089 Other specified noninflammatory disorders of vulva and perineum: Secondary | ICD-10-CM

## 2016-03-02 DIAGNOSIS — B373 Candidiasis of vulva and vagina: Secondary | ICD-10-CM

## 2016-03-02 DIAGNOSIS — Z01419 Encounter for gynecological examination (general) (routine) without abnormal findings: Secondary | ICD-10-CM | POA: Diagnosis not present

## 2016-03-02 DIAGNOSIS — Z90710 Acquired absence of both cervix and uterus: Secondary | ICD-10-CM

## 2016-03-02 DIAGNOSIS — B3731 Acute candidiasis of vulva and vagina: Secondary | ICD-10-CM

## 2016-03-02 DIAGNOSIS — R350 Frequency of micturition: Secondary | ICD-10-CM | POA: Diagnosis not present

## 2016-03-02 LAB — POCT URINALYSIS DIPSTICK
BILIRUBIN UA: NEGATIVE
GLUCOSE UA: NEGATIVE
KETONES UA: NEGATIVE
Nitrite, UA: NEGATIVE
Protein, UA: NEGATIVE
RBC UA: NEGATIVE
SPEC GRAV UA: 1.01
Urobilinogen, UA: NEGATIVE
pH, UA: 5

## 2016-03-02 MED ORDER — FLUCONAZOLE 150 MG PO TABS: 150.0000 mg | ORAL_TABLET | Freq: Every day | ORAL | 0 refills | Status: DC

## 2016-03-02 NOTE — Patient Instructions (Addendum)
1. No Pap smear needed 2. Follow-up mammograms are ordered 3. Stool guaiac cards are given for colon cancer screening 4. Screening labs are to be obtained through primary care 5. Continue with healthy eating and exercise with control weight loss 6. Recommend calcium with vitamin D supplementation to be continue 7. Diflucan 150 mg orally 1 8. Physical therapy referral for mixed incontinence 9. Return in 1 year for annual exam   Health Maintenance for Postmenopausal Women Introduction Menopause is a normal process in which your reproductive ability comes to an end. This process happens gradually over a span of months to years, usually between the ages of 15 and 80. Menopause is complete when you have missed 12 consecutive menstrual periods. It is important to talk with your health care provider about some of the most common conditions that affect postmenopausal women, such as heart disease, cancer, and bone loss (osteoporosis). Adopting a healthy lifestyle and getting preventive care can help to promote your health and wellness. Those actions can also lower your chances of developing some of these common conditions. What should I know about menopause? During menopause, you may experience a number of symptoms, such as:  Moderate-to-severe hot flashes.  Night sweats.  Decrease in sex drive.  Mood swings.  Headaches.  Tiredness.  Irritability.  Memory problems.  Insomnia. Choosing to treat or not to treat menopausal changes is an individual decision that you make with your health care provider. What should I know about hormone replacement therapy and supplements? Hormone therapy products are effective for treating symptoms that are associated with menopause, such as hot flashes and night sweats. Hormone replacement carries certain risks, especially as you become older. If you are thinking about using estrogen or estrogen with progestin treatments, discuss the benefits and risks with  your health care provider. What should I know about heart disease and stroke? Heart disease, heart attack, and stroke become more likely as you age. This may be due, in part, to the hormonal changes that your body experiences during menopause. These can affect how your body processes dietary fats, triglycerides, and cholesterol. Heart attack and stroke are both medical emergencies. There are many things that you can do to help prevent heart disease and stroke:  Have your blood pressure checked at least every 1-2 years. High blood pressure causes heart disease and increases the risk of stroke.  If you are 32-30 years old, ask your health care provider if you should take aspirin to prevent a heart attack or a stroke.  Do not use any tobacco products, including cigarettes, chewing tobacco, or electronic cigarettes. If you need help quitting, ask your health care provider.  It is important to eat a healthy diet and maintain a healthy weight.  Be sure to include plenty of vegetables, fruits, low-fat dairy products, and lean protein.  Avoid eating foods that are high in solid fats, added sugars, or salt (sodium).  Get regular exercise. This is one of the most important things that you can do for your health.  Try to exercise for at least 150 minutes each week. The type of exercise that you do should increase your heart rate and make you sweat. This is known as moderate-intensity exercise.  Try to do strengthening exercises at least twice each week. Do these in addition to the moderate-intensity exercise.  Know your numbers.Ask your health care provider to check your cholesterol and your blood glucose. Continue to have your blood tested as directed by your health care provider. What  should I know about cancer screening? There are several types of cancer. Take the following steps to reduce your risk and to catch any cancer development as early as possible. Breast Cancer  Practice breast  self-awareness.  This means understanding how your breasts normally appear and feel.  It also means doing regular breast self-exams. Let your health care provider know about any changes, no matter how small.  If you are 16 or older, have a clinician do a breast exam (clinical breast exam or CBE) every year. Depending on your age, family history, and medical history, it may be recommended that you also have a yearly breast X-ray (mammogram).  If you have a family history of breast cancer, talk with your health care provider about genetic screening.  If you are at high risk for breast cancer, talk with your health care provider about having an MRI and a mammogram every year.  Breast cancer (BRCA) gene test is recommended for women who have family members with BRCA-related cancers. Results of the assessment will determine the need for genetic counseling and BRCA1 and for BRCA2 testing. BRCA-related cancers include these types:  Breast. This occurs in males or females.  Ovarian.  Tubal. This may also be called fallopian tube cancer.  Cancer of the abdominal or pelvic lining (peritoneal cancer).  Prostate.  Pancreatic. Cervical, Uterine, and Ovarian Cancer  Your health care provider may recommend that you be screened regularly for cancer of the pelvic organs. These include your ovaries, uterus, and vagina. This screening involves a pelvic exam, which includes checking for microscopic changes to the surface of your cervix (Pap test).  For women ages 21-65, health care providers may recommend a pelvic exam and a Pap test every three years. For women ages 42-65, they may recommend the Pap test and pelvic exam, combined with testing for human papilloma virus (HPV), every five years. Some types of HPV increase your risk of cervical cancer. Testing for HPV may also be done on women of any age who have unclear Pap test results.  Other health care providers may not recommend any screening for  nonpregnant women who are considered low risk for pelvic cancer and have no symptoms. Ask your health care provider if a screening pelvic exam is right for you.  If you have had past treatment for cervical cancer or a condition that could lead to cancer, you need Pap tests and screening for cancer for at least 20 years after your treatment. If Pap tests have been discontinued for you, your risk factors (such as having a new sexual partner) need to be reassessed to determine if you should start having screenings again. Some women have medical problems that increase the chance of getting cervical cancer. In these cases, your health care provider may recommend that you have screening and Pap tests more often.  If you have a family history of uterine cancer or ovarian cancer, talk with your health care provider about genetic screening.  If you have vaginal bleeding after reaching menopause, tell your health care provider.  There are currently no reliable tests available to screen for ovarian cancer. Lung Cancer  Lung cancer screening is recommended for adults 70-64 years old who are at high risk for lung cancer because of a history of smoking. A yearly low-dose CT scan of the lungs is recommended if you:  Currently smoke.  Have a history of at least 30 pack-years of smoking and you currently smoke or have quit within the past 15  years. A pack-year is smoking an average of one pack of cigarettes per day for one year. Yearly screening should:  Continue until it has been 15 years since you quit.  Stop if you develop a health problem that would prevent you from having lung cancer treatment. Colorectal Cancer  This type of cancer can be detected and can often be prevented.  Routine colorectal cancer screening usually begins at age 66 and continues through age 67.  If you have risk factors for colon cancer, your health care provider may recommend that you be screened at an earlier age.  If you have  a family history of colorectal cancer, talk with your health care provider about genetic screening.  Your health care provider may also recommend using home test kits to check for hidden blood in your stool.  A small camera at the end of a tube can be used to examine your colon directly (sigmoidoscopy or colonoscopy). This is done to check for the earliest forms of colorectal cancer.  Direct examination of the colon should be repeated every 5-10 years until age 41. However, if early forms of precancerous polyps or small growths are found or if you have a family history or genetic risk for colorectal cancer, you may need to be screened more often. Skin Cancer  Check your skin from head to toe regularly.  Monitor any moles. Be sure to tell your health care provider:  About any new moles or changes in moles, especially if there is a change in a mole's shape or color.  If you have a mole that is larger than the size of a pencil eraser.  If any of your family members has a history of skin cancer, especially at a young age, talk with your health care provider about genetic screening.  Always use sunscreen. Apply sunscreen liberally and repeatedly throughout the day.  Whenever you are outside, protect yourself by wearing long sleeves, pants, a wide-brimmed hat, and sunglasses. What should I know about osteoporosis? Osteoporosis is a condition in which bone destruction happens more quickly than new bone creation. After menopause, you may be at an increased risk for osteoporosis. To help prevent osteoporosis or the bone fractures that can happen because of osteoporosis, the following is recommended:  If you are 69-4 years old, get at least 1,000 mg of calcium and at least 600 mg of vitamin D per day.  If you are older than age 48 but younger than age 32, get at least 1,200 mg of calcium and at least 600 mg of vitamin D per day.  If you are older than age 31, get at least 1,200 mg of calcium and  at least 800 mg of vitamin D per day. Smoking and excessive alcohol intake increase the risk of osteoporosis. Eat foods that are rich in calcium and vitamin D, and do weight-bearing exercises several times each week as directed by your health care provider. What should I know about how menopause affects my mental health? Depression may occur at any age, but it is more common as you become older. Common symptoms of depression include:  Low or sad mood.  Changes in sleep patterns.  Changes in appetite or eating patterns.  Feeling an overall lack of motivation or enjoyment of activities that you previously enjoyed.  Frequent crying spells. Talk with your health care provider if you think that you are experiencing depression. What should I know about immunizations? It is important that you get and maintain your immunizations.  These include:  Tetanus, diphtheria, and pertussis (Tdap) booster vaccine.  Influenza every year before the flu season begins.  Pneumonia vaccine.  Shingles vaccine. Your health care provider may also recommend other immunizations. This information is not intended to replace advice given to you by your health care provider. Make sure you discuss any questions you have with your health care provider. Document Released: 02/26/2005 Document Revised: 07/25/2015 Document Reviewed: 10/08/2014  2017 [employer]

## 2016-03-02 NOTE — Progress Notes (Signed)
ANNUAL PREVENTATIVE CARE GYN  ENCOUNTER NOTE  Subjective:       Megan Harris is a 66 y.o. [phone] female here for a routine annual gynecologic exam.  Current complaints: 1.  Nodule on left labia; several months duration; waxes and wanes; no drainage  2. painful IC; uses lubricants with satisfaction; no prior use of estrogen therapy  3. Urinary frequency and urgency, chronic  Status post TVH 1977 Status post BSO multiple years later. No long-term use of ERT therapy; no significant vasomotor symptoms at this time; does report vaginal dryness with dyspareunia that does respond to over-the-counter lubricants. No history of abnormal Pap smears Monogamous, married long-term  Gynecologic History No LMP recorded. Patient has had a hysterectomy. Contraception: status post hysterectomy TVH Last Pap: unknown- no abn. Results were: normal Last mammogram: 02/17/2016- repeat dx mammo on left. Results pending  Obstetric History OB History  Gravida Para Term Preterm AB Living  4 2 2   2 2   SAB TAB Ectopic Multiple Live Births  2       2    # Outcome Date GA Lbr Len/2nd Weight Sex Delivery Anes PTL Lv  4 Term 1977   9 lb 1.6 oz (4.128 kg) M Vag-Spont   LIV  3 SAB 1974          2 Term 1971   8 lb 2.2 oz (3.692 kg) F Vag-Spont   LIV  1 SAB 1969              Past Medical History:  Diagnosis Date  . Abdominal pain, left lower quadrant 2012  . Arthritis   . Asthma   . Back pain 2005  . Breast screening, unspecified 2012  . Chronic cholecystitis 2012  . Constipation 2005  . Endocrine problem 2005  . GERD (gastroesophageal reflux disease) 2012  . Hyperlipidemia 2012  . Hypertension 2002  . Nausea with vomiting 2012  . Obesity, unspecified 2012  . Special screening for malignant neoplasms, colon 2012    Past Surgical History:  Procedure Laterality Date  . ABDOMINAL HYSTERECTOMY  1977  . BREAST BIOPSY Left 15+ yrs ago   neg  . BUNIONECTOMY Bilateral 2003  . CHOLECYSTECTOMY   06/04/2010  . COLON RESECTION  2004  . COLONOSCOPY  2005   [place], Dr. Bary Castilla  . COLOSTOMY  2004  . COLOSTOMY REVERSAL  2004  . FOOT SURGERY Bilateral 2012   plantar faciatis  . HAND SURGERY    . HERNIA REPAIR  2005   at colostomy site after reversal done  . KNEE ARTHROSCOPY Right 02/03/2015   Procedure: ARTHROSCOPY right knee, partial medial menisectomy, condyle malleolus, patella and femoral;  Surgeon: Dereck Leep, MD;  Location: [facility] ORS;  Service: Orthopedics;  Laterality: Right;  . NASAL SINUS SURGERY     2005  . SALPINGOOPHORECTOMY  1998  . TONSILLECTOMY      Current Outpatient Prescriptions on File Prior to Visit  Medication Sig Dispense Refill  . atorvastatin (LIPITOR) 10 MG tablet Take 10 mg by mouth daily.    . B Complex-C (SUPER B COMPLEX/VITAMIN C PO) Take 1 tablet by mouth daily.    Marland Kitchen BIOTIN PO Take 1 tablet by mouth 3 (three) times daily.    . Fish Oil OIL Take 1,200 mg by mouth 2 (two) times daily.    . lansoprazole (PREVACID) 30 MG capsule Take 30 mg by mouth 2 (two) times daily.     Marland Kitchen lisinopril-hydrochlorothiazide (PRINZIDE,ZESTORETIC) 20-12.5 MG per  tablet Take 1 tablet by mouth daily.    Marland Kitchen LYSINE PO Take 1,000 mg by mouth daily as needed.    . montelukast (SINGULAIR) 10 MG tablet Take 10 mg by mouth at bedtime.    . Multiple Vitamins-Minerals (MULTIVITAMIN WITH MINERALS) tablet Take 1 tablet by mouth daily.    Marland Kitchen zolpidem (AMBIEN) 10 MG tablet Take 10 mg by mouth at bedtime as needed for sleep.     No current facility-administered medications on file prior to visit.     Allergies  Allergen Reactions  . Codeine Other (See Comments)    Breathing problems  . Penicillins Hives  . Tape Dermatitis    Skin irritation    Social History   Social History  . Marital status: Married    Spouse name: N/A  . Number of children: N/A  . Years of education: N/A   Occupational History  . Not on file.   Social History Main Topics  . Smoking status: Never Smoker   . Smokeless tobacco: Not on file  . Alcohol use Yes     Comment: socially  . Drug use: No  . Sexual activity: Not on file   Other Topics Concern  . Not on file   Social History Narrative  . No narrative on file    Family History  Problem Relation Age of Onset  . Breast cancer Neg Hx     The following portions of the patient's history were reviewed and updated as appropriate: allergies, current medications, past family history, past medical history, past social history, past surgical history and problem list.  Review of Systems ROS Review of Systems - General ROS: negative for - chills, fatigue, fever, hot flashes, night sweats, weight gain or weight loss Psychological ROS: negative for - anxiety, decreased libido, depression, mood swings, physical abuse or sexual abuse Ophthalmic ROS: negative for - blurry vision, eye pain or loss of vision ENT ROS: negative for - headaches, hearing change, visual changes or vocal changes Allergy and Immunology ROS: negative for - hives, itchy/watery eyes or seasonal allergies Hematological and Lymphatic ROS: negative for - bleeding problems, bruising, swollen lymph nodes or weight loss Endocrine ROS: negative for - galactorrhea, hair pattern changes, hot flashes, malaise/lethargy, mood swings, palpitations, polydipsia/polyuria, skin changes, temperature intolerance or unexpected weight changes Breast ROS: negative for - new or changing breast lumps or nipple discharge Respiratory ROS: negative for - cough or shortness of breath Cardiovascular ROS: negative for - chest pain, irregular heartbeat, palpitations or shortness of breath Gastrointestinal ROS: no abdominal pain, change in bowel habits, or black or bloody stools Genito-Urinary ROS: no dysuria, trouble voiding, or hematuria. POSITIVE urinary frequency, urgency, stress incontinence and urge incontinence, does not wear pads Musculoskeletal ROS: negative for - joint pain or joint  stiffness Neurological ROS: negative for - bowel and bladder control changes Dermatological ROS: negative for rash and skin lesion changes   Objective:   BP (!) 145/83   Pulse 73   Ht 5\' 5"  (1.651 m)   Wt 244 lb (110.7 kg)   BMI 40.60 kg/m  CONSTITUTIONAL: Well-developed, well-nourished female in no acute distress.  PSYCHIATRIC: Normal mood and affect. Normal behavior. Normal judgment and thought content. Radium: Alert and oriented to person, place, and time. Normal muscle tone coordination. No cranial nerve deficit noted. HENT:  Normocephalic, atraumatic, External right and left ear normal. Oropharynx is clear and moist EYES: Conjunctivae and EOM are normal.  No scleral icterus.  NECK: Normal range of motion,  supple, no masses.  Normal thyroid.  SKIN: Skin is warm and dry. No rash noted. Not diaphoretic. No erythema. No pallor. CARDIOVASCULAR: Normal heart rate noted, regular rhythm, no murmur. RESPIRATORY: Clear to auscultation bilaterally. Effort and breath sounds normal, no problems with respiration noted. BREASTS: Symmetric in size. No masses, skin changes, nipple drainage, or lymphadenopathy. ABDOMEN: Soft, normal bowel sounds, no distention noted.  No tenderness, rebound or guarding. Multiple abdominal scars; fullness in the left mid abdomen, superficial, at site of prior colon surgery and hernias BLADDER: Normal PELVIC:  External Genitalia: Normal; hyperplastic labia minora; left labia majora contains inclusion cyst with head, non-draining  BUS: Normal  Vagina: Normal estrogen effect; good vault support surgically absent; white thick discharge  Cervix: Normal  Uterus: Surgically absent  Adnexa: Normal; nonpalpable and nontender  RV: External Exam NormaI, No Rectal Masses and Normal Sphincter tone  MUSCULOSKELETAL: Normal range of motion. No tenderness.  No cyanosis, clubbing, or edema.  2+ distal pulses. LYMPHATIC: No Axillary, Supraclavicular, or Inguinal  Adenopathy.  PROCEDURE: Wet prep Normal saline-few white blood cells; no clue cells KOH-branching hyphae  Assessment:   Annual gynecologic examination 66 y.o. Contraception: status post hysterectomy TVH; status post BSO bmi-40 Mixed incontinence; does not desire medical therapy; is open to physical therapy Vaginal dryness responsive to lubricants Monilia vaginitis Plan:  Pap: Not needed Mammogram: thru pcp Stool Guaiac Testing:  ordered Labs: thru pcp Routine preventative health maintenance measures emphasized: Exercise/Diet/Weight control, Tobacco Warnings and Alcohol/Substance use risks Continue OTC lubricants for vaginal dryness Physical therapy referral for management of Incontinence Wet prep-completed Diflucan 150 mg orally 1 Return to [place], CMA  Brayton Mars, MD  Note: This dictation was prepared with Dragon dictation along with smaller phrase technology. Any transcriptional errors that result from this process are unintentional.

## 2016-03-04 ENCOUNTER — Ambulatory Visit
Admission: RE | Admit: 2016-03-04 | Discharge: 2016-03-04 | Disposition: A | Discharge status: Home / Self Care | Payer: PPO | Source: Ambulatory Visit | Attending: Internal Medicine | Admitting: Internal Medicine

## 2016-03-04 DIAGNOSIS — R928 Other abnormal and inconclusive findings on diagnostic imaging of breast: Secondary | ICD-10-CM

## 2016-03-04 LAB — URINE CULTURE

## 2016-03-06 LAB — FECAL OCCULT BLOOD, IMMUNOCHEMICAL: FECAL OCCULT BLD: NEGATIVE

## 2016-05-19 DIAGNOSIS — M1731 Unilateral post-traumatic osteoarthritis, right knee: Secondary | ICD-10-CM | POA: Diagnosis not present

## 2016-05-24 ENCOUNTER — Ambulatory Visit: Payer: PPO | Admitting: Physical Therapy

## 2016-05-26 DIAGNOSIS — M1731 Unilateral post-traumatic osteoarthritis, right knee: Secondary | ICD-10-CM | POA: Diagnosis not present

## 2016-06-02 DIAGNOSIS — M1731 Unilateral post-traumatic osteoarthritis, right knee: Secondary | ICD-10-CM | POA: Diagnosis not present

## 2016-06-07 ENCOUNTER — Encounter: Payer: Self-pay | Admitting: Physical Therapy

## 2016-06-07 ENCOUNTER — Encounter: Payer: PPO | Admitting: Physical Therapy

## 2016-06-07 ENCOUNTER — Ambulatory Visit: Payer: PPO | Attending: Obstetrics and Gynecology | Admitting: Physical Therapy

## 2016-06-07 DIAGNOSIS — R2689 Other abnormalities of gait and mobility: Secondary | ICD-10-CM

## 2016-06-07 DIAGNOSIS — G8929 Other chronic pain: Secondary | ICD-10-CM

## 2016-06-07 DIAGNOSIS — M545 Low back pain: Secondary | ICD-10-CM | POA: Insufficient documentation

## 2016-06-07 DIAGNOSIS — M6281 Muscle weakness (generalized): Secondary | ICD-10-CM | POA: Diagnosis not present

## 2016-06-07 DIAGNOSIS — M533 Sacrococcygeal disorders, not elsewhere classified: Secondary | ICD-10-CM | POA: Insufficient documentation

## 2016-06-07 NOTE — Patient Instructions (Addendum)
  Frog stretch: laying on belly , knees bent, inhale do nothing, exhale let ankles fall apart ~4 10 reps x 3     Standing: Mini squats, knees behind toes   10 x 3 reps

## 2016-06-08 NOTE — Therapy (Addendum)
[place] MAIN [facility] SERVICES [address] [place], [place], [address] Phone: [phone]   Fax:  [phone]  Physical Therapy Evaluation  Patient Details  Name: Megan Harris MRN: [id number] Date of Birth: Oct 24, 1950 Referring Provider: DeFranscensco  Encounter Date: 06/07/2016    Past Medical History:  Diagnosis Date  . Abdominal pain, left lower quadrant 2012  . Arthritis   . Asthma   . Back pain 2005  . Breast screening, unspecified 2012  . Chronic cholecystitis 2012  . Constipation 2005  . Endocrine problem 2005  . GERD (gastroesophageal reflux disease) 2012  . Hyperlipidemia 2012  . Hypertension 2002  . Nausea with vomiting 2012  . Obesity, unspecified 2012  . Special screening for malignant neoplasms, colon 2012    Past Surgical History:  Procedure Laterality Date  . BREAST BIOPSY Left 15+ yrs ago   neg  . BUNIONECTOMY Bilateral 2003  . CHOLECYSTECTOMY  06/04/2010  . COLON RESECTION  2004   due diverticulitis   . COLONOSCOPY  2005   [place], Dr. Bary Castilla  . COLOSTOMY  2004  . COLOSTOMY REVERSAL  2004  . FOOT SURGERY Bilateral 2012   plantar faciatis  . HAND SURGERY    . HERNIA REPAIR  2005   at colostomy site after reversal done  . KNEE ARTHROSCOPY Right 02/03/2015   Procedure: ARTHROSCOPY right knee, partial medial menisectomy, condyle malleolus, patella and femoral;  Surgeon: Dereck Leep, MD;  Location: [facility] ORS;  Service: Orthopedics;  Laterality: Right;  . NASAL SINUS SURGERY     2005  . SALPINGOOPHORECTOMY  1998  . TONSILLECTOMY    . TUBAL LIGATION  1978  . VAGINAL HYSTERECTOMY  1987    There were no vitals filed for this visit.       Subjective Assessment - 06/14/16 2319    Subjective 1) Pt feels vaginal pressure as if she has to go potty. This pressure has been present for the past 3 months. Pt urinates twice and feels she has not completely emptied. Pt urinates once every hours during the day and  twice at night. Denies wearing of urinary pads. Pt has changed her PJs due to not making it to the toilet before leaking.      2) tailbone pain that occured after falling onto a tree root 1.5 yrs ago. Pt has difficulty sitting and uses a doughnut pillow. Pain 6-7/10 after sitting for 30 min. Ice and moist heat and ibuprofen relieves the pain. Pt has not received any treatment. 3) Pt reports CLBP for the 5 years that does not radiate down the leg, located across the low back. Pt has not had surgery. Hx of arthritis. CLBP has worsened since her fall onto her tailbone. Pt uses TENS unit to relief LBP   4) Pt has B knee pain R is worst than L due to arthritis and she has undergone meniscus repair on R and received gel shots on on the R knee.      Pertinent History Hx of tubal ligation, hysterectomy, colon resection 2/2 diverticulitis, Hx of a fall over her tailbone landed onto a tree root 2016. Occupation: cleans houses    Patient Stated Goals pressure relief and eliminate tailbone discomfort             [facility] PT Assessment - 06/14/16 2305      Assessment   Medical Diagnosis mixed urinary incontinence   Referring Provider DeFranscensco     Precautions   Precautions  None     Restrictions   Weight Bearing Restrictions No     Balance Screen   Has the patient fallen in the past 6 months No     Single Leg Squat   Comments L SLS with [place]      Posture/Postural Control   Posture Comments lumbopelvic perturbations with leg movements     AROM   Overall AROM Comments R rotation ~45 deg, L 60deg      Strength   Overall Strength Comments hip ext in prone R 4-/5, L 5/5      Palpation   SI assessment  standing: R PSIS lower than L    Palpation comment coccyx deviation to R, mm tensions at R coccgyeus.  Abdominal scar restrictions                      [facility] Adult PT Treatment/Exercise - 06/14/16 2305      Therapeutic Activites    Therapeutic Activities --  see pt  instructions     Neuro Re-ed    Neuro Re-ed Details  see pt instructions     Manual Therapy   Manual therapy comments long axis distraction, rotational mob, STM along R coccygeus mm                      PT Long Term Goals - 06/07/16 1639      PT LONG TERM GOAL #1   Title Pt will report she feels she has completely emptied after voiding and no longer needs to return to the toilet a second time in order to continue with work duties   Time 12   Period Weeks   Status New     PT LONG TERM GOAL #2   Title Pt will demo decreased R coccygeus mm tensions/tenderness and no deviations of the coccyx in order to sit for more than 30 min without pain   Baseline 12   Period Weeks   Status New     PT LONG TERM GOAL #3   Title Pt will demo no lumbopelvic perturbations with deep core level 2-3 for 5 reps in order to improve postural stability to minimize CLBP and not have to rely on the TENS unit across 2 weeks   Time 12   Period Weeks   Status New     PT LONG TERM GOAL #4   Title Pt will demo decreased B knee pain by 50% with sit to stand and stair clmibing/ descending with single UE on rail 1 flight in order to ambulate safely through the community   Time 12   Period Weeks   Status New     PT LONG TERM GOAL #5   Title Pt will decrease her PFDI score from  % to   < % in order to report less pressure sensation to perform her chores and hobbies   Time 12   Period Weeks   Status New     Additional Long Term Goals   Additional Long Term Goals Yes     PT LONG TERM GOAL #6   Title Pt will demo decreased score on ODI from % to  < % inorder to improve her LBP and perform more ADLs at home   Time 12   Period Weeks   Status New               Plan - 06/14/16 2301    Clinical Impression Statement Pt is  a 66 yo female pt who reports vaginal pressure sensation, tailbone pain, CLBP, B knee pain. These deficits impact her QOL and ADLs.  Pt's clinical presentations include  pelvic obliquities, coccgyx deviation, coccygeus mm tensions/ tensions, deep core weakness/ coordination, scar restrictions over abdomen, and spinal limitation. Plan to perform an intravaginal assessment at next session. Pt demo'd a more symmetrically aligned pelvic girdle/coccgyx and less coccygeus mm tensions following today's Tx. Anticipate restoring pelvic/coccgyx alignment will help pt's Sx related to tailbone pain. Additional assessment and Tx will address her other Sx.    Rehab Potential Good   PT Frequency Biweekly  due to pt's work schedule   PT Duration 12 weeks   PT Treatment/Interventions Aquatic Therapy;ADLs/Self Care Home Management;Neuromuscular re-education;Balance training;Manual techniques;Functional mobility training;Moist Heat;Patient/family education;Therapeutic exercise;Therapeutic activities;Taping;Manual lymph drainage;Energy conservation;Passive range of motion   Consulted and Agree with Plan of Care Patient      Patient will benefit from skilled therapeutic intervention in order to improve the following deficits and impairments:  Abnormal gait, Decreased balance, Decreased endurance, Decreased mobility, Decreased range of motion, Decreased knowledge of precautions, Decreased coordination, Decreased activity tolerance, Decreased safety awareness, Decreased strength, Improper body mechanics, Decreased scar mobility, Increased muscle spasms, Difficulty walking  Visit Diagnosis: Sacrococcygeal disorders, not elsewhere classified  Muscle weakness (generalized)  Other abnormalities of gait and mobility  Chronic midline low back pain without sciatica     Problem List Patient Active Problem List   Diagnosis Date Noted  . Status post bilateral salpingo-oophorectomy (BSO) 03/02/2016  . Obesity, Class III, BMI 40-49.9 (morbid obesity) ([place]) 03/02/2016  . Urinary urgency 03/02/2016  . Urinary frequency 03/02/2016  . Vulvar lesion 03/02/2016  . Status post vaginal  hysterectomy 03/02/2016  . Surgical menopause 03/02/2016  . Dyspareunia, female 03/02/2016  . Vaginal atrophy 03/02/2016  . Mixed stress and urge urinary incontinence 03/02/2016  . Nausea alone 06/19/2012  . Encounter for screening colonoscopy for non-high-risk patient 04/27/2012    Jerl Mina ,PT, DPT, E-RYT  06/14/2016, 11:19 PM  [place] MAIN [facility] SERVICES [address] [place], [place], [address] Phone: [phone]   Fax:  [phone]  Name: Megan Harris MRN: [id number] Date of Birth: 1950-03-23

## 2016-06-09 DIAGNOSIS — R7309 Other abnormal glucose: Secondary | ICD-10-CM | POA: Diagnosis not present

## 2016-06-09 DIAGNOSIS — E7800 Pure hypercholesterolemia, unspecified: Secondary | ICD-10-CM | POA: Diagnosis not present

## 2016-06-09 DIAGNOSIS — Z79899 Other long term (current) drug therapy: Secondary | ICD-10-CM | POA: Diagnosis not present

## 2016-06-10 DIAGNOSIS — Z0000 Encounter for general adult medical examination without abnormal findings: Secondary | ICD-10-CM | POA: Diagnosis not present

## 2016-06-10 DIAGNOSIS — I10 Essential (primary) hypertension: Secondary | ICD-10-CM | POA: Diagnosis not present

## 2016-06-10 DIAGNOSIS — J4520 Mild intermittent asthma, uncomplicated: Secondary | ICD-10-CM | POA: Diagnosis not present

## 2016-06-10 DIAGNOSIS — E119 Type 2 diabetes mellitus without complications: Secondary | ICD-10-CM | POA: Diagnosis not present

## 2016-06-10 DIAGNOSIS — Z6841 Body Mass Index (BMI) 40.0 and over, adult: Secondary | ICD-10-CM | POA: Diagnosis not present

## 2016-06-10 DIAGNOSIS — E7800 Pure hypercholesterolemia, unspecified: Secondary | ICD-10-CM | POA: Diagnosis not present

## 2016-06-14 NOTE — Addendum Note (Signed)
Addended by: Jerl Mina on: 06/14/2016 11:23 PM   Modules accepted: Orders

## 2016-06-21 ENCOUNTER — Ambulatory Visit: Payer: PPO | Admitting: Physical Therapy

## 2016-07-05 ENCOUNTER — Encounter: Payer: PPO | Admitting: Physical Therapy

## 2016-07-19 ENCOUNTER — Ambulatory Visit: Payer: PPO | Admitting: Physical Therapy

## 2016-08-02 ENCOUNTER — Encounter: Payer: PPO | Admitting: Physical Therapy

## 2016-08-03 DIAGNOSIS — E7800 Pure hypercholesterolemia, unspecified: Secondary | ICD-10-CM | POA: Diagnosis not present

## 2016-08-03 DIAGNOSIS — Z6841 Body Mass Index (BMI) 40.0 and over, adult: Secondary | ICD-10-CM | POA: Diagnosis not present

## 2016-08-03 DIAGNOSIS — I10 Essential (primary) hypertension: Secondary | ICD-10-CM | POA: Diagnosis not present

## 2016-08-03 DIAGNOSIS — E119 Type 2 diabetes mellitus without complications: Secondary | ICD-10-CM | POA: Diagnosis not present

## 2016-08-16 ENCOUNTER — Encounter: Payer: PPO | Admitting: Physical Therapy

## 2016-09-21 DIAGNOSIS — Q6621 Congenital metatarsus primus varus: Secondary | ICD-10-CM | POA: Diagnosis not present

## 2016-09-21 DIAGNOSIS — M79671 Pain in right foot: Secondary | ICD-10-CM | POA: Diagnosis not present

## 2016-09-21 DIAGNOSIS — M7752 Other enthesopathy of left foot: Secondary | ICD-10-CM | POA: Diagnosis not present

## 2016-09-21 DIAGNOSIS — M79672 Pain in left foot: Secondary | ICD-10-CM | POA: Diagnosis not present

## 2016-09-21 DIAGNOSIS — M7751 Other enthesopathy of right foot: Secondary | ICD-10-CM | POA: Diagnosis not present

## 2016-09-21 DIAGNOSIS — I10 Essential (primary) hypertension: Secondary | ICD-10-CM | POA: Diagnosis not present

## 2016-11-17 DIAGNOSIS — E119 Type 2 diabetes mellitus without complications: Secondary | ICD-10-CM | POA: Diagnosis not present

## 2016-11-17 DIAGNOSIS — E7800 Pure hypercholesterolemia, unspecified: Secondary | ICD-10-CM | POA: Diagnosis not present

## 2016-11-17 DIAGNOSIS — K219 Gastro-esophageal reflux disease without esophagitis: Secondary | ICD-10-CM | POA: Diagnosis not present

## 2016-11-17 DIAGNOSIS — F5104 Psychophysiologic insomnia: Secondary | ICD-10-CM | POA: Diagnosis not present

## 2016-11-17 DIAGNOSIS — I10 Essential (primary) hypertension: Secondary | ICD-10-CM | POA: Diagnosis not present

## 2016-11-17 DIAGNOSIS — Z79899 Other long term (current) drug therapy: Secondary | ICD-10-CM | POA: Diagnosis not present

## 2016-11-17 DIAGNOSIS — Z23 Encounter for immunization: Secondary | ICD-10-CM | POA: Diagnosis not present

## 2016-11-17 DIAGNOSIS — E559 Vitamin D deficiency, unspecified: Secondary | ICD-10-CM | POA: Diagnosis not present

## 2016-11-17 DIAGNOSIS — J4520 Mild intermittent asthma, uncomplicated: Secondary | ICD-10-CM | POA: Diagnosis not present

## 2016-12-05 ENCOUNTER — Other Ambulatory Visit: Payer: Self-pay

## 2016-12-05 ENCOUNTER — Emergency Department: Payer: PPO

## 2016-12-05 ENCOUNTER — Emergency Department
Admission: EM | Admit: 2016-12-05 | Discharge: 2016-12-05 | Disposition: A | Discharge status: Home / Self Care | Payer: PPO | Attending: Emergency Medicine | Admitting: Emergency Medicine

## 2016-12-05 DIAGNOSIS — I10 Essential (primary) hypertension: Secondary | ICD-10-CM | POA: Insufficient documentation

## 2016-12-05 DIAGNOSIS — R079 Chest pain, unspecified: Secondary | ICD-10-CM

## 2016-12-05 DIAGNOSIS — R0789 Other chest pain: Secondary | ICD-10-CM | POA: Diagnosis not present

## 2016-12-05 DIAGNOSIS — J45909 Unspecified asthma, uncomplicated: Secondary | ICD-10-CM | POA: Insufficient documentation

## 2016-12-05 DIAGNOSIS — Z79899 Other long term (current) drug therapy: Secondary | ICD-10-CM | POA: Diagnosis not present

## 2016-12-05 LAB — BASIC METABOLIC PANEL
Anion gap: 10 (ref 5–15)
BUN: 16 mg/dL (ref 6–20)
CALCIUM: 9.1 mg/dL (ref 8.9–10.3)
CO2: 28 mmol/L (ref 22–32)
CREATININE: 0.83 mg/dL (ref 0.44–1.00)
Chloride: 100 mmol/L — ABNORMAL LOW (ref 101–111)
Glucose, Bld: 100 mg/dL — ABNORMAL HIGH (ref 65–99)
Potassium: 3.5 mmol/L (ref 3.5–5.1)
Sodium: 138 mmol/L (ref 135–145)

## 2016-12-05 LAB — CBC
HCT: 42.2 % (ref 35.0–47.0)
Hemoglobin: 14.2 g/dL (ref 12.0–16.0)
MCH: 29.5 pg (ref 26.0–34.0)
MCHC: 33.7 g/dL (ref 32.0–36.0)
MCV: 87.7 fL (ref 80.0–100.0)
PLATELETS: 208 10*3/uL (ref 150–440)
RBC: 4.82 MIL/uL (ref 3.80–5.20)
RDW: 13.2 % (ref 11.5–14.5)
WBC: 6.1 10*3/uL (ref 3.6–11.0)

## 2016-12-05 LAB — TROPONIN I: Troponin I: <0.03 ng/mL (ref <0.03)

## 2016-12-05 MED ORDER — NITROGLYCERIN 0.4 MG SL SUBL
0.4000 mg | SUBLINGUAL_TABLET | SUBLINGUAL | Status: DC | PRN
  Administered 2016-12-05 (×3): 0.4 mg via SUBLINGUAL
  Filled 2016-12-05: qty 1

## 2016-12-05 MED ORDER — NITROGLYCERIN 2 % TD OINT
0.5000 [in_us] | TOPICAL_OINTMENT | TRANSDERMAL | Status: AC
  Administered 2016-12-05: 0.5 [in_us] via TOPICAL
  Filled 2016-12-05: qty 1

## 2016-12-05 MED ORDER — ASPIRIN 81 MG PO CHEW
324.0000 mg | CHEWABLE_TABLET | Freq: Once | ORAL | Status: AC
  Administered 2016-12-05: 324 mg via ORAL
  Filled 2016-12-05: qty 4

## 2016-12-05 NOTE — ED Provider Notes (Addendum)
[facility] Emergency Department Provider Note   ____________________________________________   First MD Initiated Contact with Patient 12/05/16 1340     (approximate)  I have reviewed the triage vital signs and the nursing notes.   HISTORY  Chief Complaint Chest Pain    HPI Megan Harris is a 66 y.o. female here for evaluation of chest discomfort.  Patient reports she has been experiencing a pressure-like feeling in her left upper chest that gives a slight tingling in her left upper arm for the last 2 days.  No nausea or vomiting.  No shortness of breath.  Nothing seems to make it better or worse.  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  She has been taking baby aspirin daily.  Currently reports a mild to moderate sensation of pressure in the chest.  No abdominal pain.  No fevers or chills.  No history of coronary disease but does have a health elevated history of blood pressure as well as cholesterol  She is not a smoker and reports she had a stress test a few years ago, cannot remember exactly when but remembers it was told to her as normal   Past Medical History:  Diagnosis Date  . Abdominal pain, left lower quadrant 2012  . Arthritis   . Asthma   . Back pain 2005  . Breast screening, unspecified 2012  . Chronic cholecystitis 2012  . Constipation 2005  . Endocrine problem 2005  . GERD (gastroesophageal reflux disease) 2012  . Hyperlipidemia 2012  . Hypertension 2002  . Nausea with vomiting 2012  . Obesity, unspecified 2012  . Special screening for malignant neoplasms, colon 2012    Patient Active Problem List   Diagnosis Date Noted  . Status post bilateral salpingo-oophorectomy (BSO) 03/02/2016  . Obesity, Class III, BMI 40-49.9 (morbid obesity) ([place]) 03/02/2016  . Urinary urgency 03/02/2016  . Urinary frequency 03/02/2016  .  Vulvar lesion 03/02/2016  . Status post vaginal hysterectomy 03/02/2016  . Surgical menopause 03/02/2016  . Dyspareunia, female 03/02/2016  . Vaginal atrophy 03/02/2016  . Mixed stress and urge urinary incontinence 03/02/2016  . Nausea alone 06/19/2012  . Encounter for screening colonoscopy for non-high-risk patient 04/27/2012    Past Surgical History:  Procedure Laterality Date  . ARTHROSCOPY right knee, partial medial menisectomy, condyle malleolus, patella and femoral Right 02/03/2015   Performed by Dereck Leep, MD at [facility] ORS  . BREAST BIOPSY Left 15+ yrs ago   neg  . BUNIONECTOMY Bilateral 2003  . CHOLECYSTECTOMY  06/04/2010  . COLON RESECTION  2004   due diverticulitis   . COLONOSCOPY  2005   [place], Dr. Bary Castilla  . COLOSTOMY  2004  . COLOSTOMY REVERSAL  2004  . FOOT SURGERY Bilateral 2012   plantar faciatis  . HAND SURGERY    . HERNIA REPAIR  2005   at colostomy site after reversal done  . NASAL SINUS SURGERY     2005  . SALPINGOOPHORECTOMY  1998  . TONSILLECTOMY    . TUBAL LIGATION  1978  . VAGINAL HYSTERECTOMY  1987    Prior to Admission medications   Medication Sig Start Date End Date Taking? Authorizing Provider  atorvastatin (LIPITOR) 10 MG tablet Take 10 mg by mouth daily.    [provider]  B Complex-C (SUPER B COMPLEX/VITAMIN C PO) Take 1 tablet  by mouth daily.    [provider]  BIOTIN PO Take 1 tablet by mouth 3 (three) times daily.    [provider]  Fish Oil OIL Take 1,200 mg by mouth 2 (two) times daily.    [provider]  fluconazole (DIFLUCAN) 150 MG tablet Take 1 tablet (150 mg total) by mouth daily. 03/02/16   Defrancesco, Alanda Slim, MD  lansoprazole (PREVACID) 30 MG capsule Take 30 mg by mouth 2 (two) times daily.     [provider]  lisinopril-hydrochlorothiazide (PRINZIDE,ZESTORETIC) 20-12.5 MG per tablet Take 1 tablet by mouth daily.    [provider]  LYSINE PO Take 1,000 mg by mouth  daily as needed.    [provider]  montelukast (SINGULAIR) 10 MG tablet Take 10 mg by mouth at bedtime.    [provider]  Multiple Vitamins-Minerals (MULTIVITAMIN WITH MINERALS) tablet Take 1 tablet by mouth daily.    [provider]  zolpidem (AMBIEN) 10 MG tablet Take 10 mg by mouth at bedtime as needed for sleep.    [provider]    Allergies Codeine; Penicillins; and Tape  Family History  Problem Relation Age of Onset  . Heart disease Father   . Diabetes Paternal Grandmother   . Diabetes Paternal Grandfather   . Breast cancer Neg Hx   . Ovarian cancer Neg Hx   . Colon cancer Neg Hx     Social History Social History   Tobacco Use  . Smoking status: Never Smoker  . Smokeless tobacco: Never Used  Substance Use Topics  . Alcohol use: Yes    Comment: socially  . Drug use: No    Review of Systems Constitutional: No fever/chills Eyes: No visual changes. ENT: No sore throat. Cardiovascular: See HPI Respiratory: Denies shortness of breath. Gastrointestinal: No abdominal pain.  No nausea, no vomiting.  No diarrhea.  No constipation. Genitourinary: Negative for dysuria. Musculoskeletal: Negative for back pain. Skin: Negative for rash. Neurological: Negative for headaches, focal weakness or numbness.    ____________________________________________   PHYSICAL EXAM:  VITAL SIGNS: ED Triage Vitals  Enc Vitals Group     BP 12/05/16 1325 (!) 187/106     Pulse Rate 12/05/16 1325 71     Resp 12/05/16 1325 17     Temp 12/05/16 1325 98.2 F (36.8 C)     Temp Source 12/05/16 1325 Oral     SpO2 12/05/16 1325 97 %     Weight 12/05/16 1326 242 lb (109.8 kg)     Height 12/05/16 1326 5\' 5"  (1.651 m)     Head Circumference --      Peak Flow --      Pain Score 12/05/16 1325 5     Pain Loc --      Pain Edu? --      Excl. in [place]? --     Constitutional: Alert and oriented. Well appearing and in no acute distress.  Does not appear to  be in pain. Eyes: Conjunctivae are normal. Head: Atraumatic. Nose: No congestion/rhinnorhea. Mouth/Throat: Mucous membranes are moist. Neck: No stridor.   Cardiovascular: Normal rate, regular rhythm. Grossly normal heart sounds.  Good peripheral circulation.  Does report some chest discomfort when pressing on the anterior chest wall which she reports feels similar. Respiratory: Normal respiratory effort.  No retractions. Lungs CTAB. Gastrointestinal: Soft and nontender. No distention. Musculoskeletal: No lower extremity tenderness nor edema. Neurologic:  Normal speech and language. No gross focal neurologic deficits are appreciated.  Skin:  Skin is warm, dry and intact. No rash noted. Psychiatric: Mood and affect are normal. Speech and behavior are normal. Overall very much normal exam.  ____________________________________________   LABS (all labs ordered are listed, but only abnormal results are displayed)  Labs Reviewed  BASIC METABOLIC PANEL - Abnormal; Notable for the following components:      Result Value   Chloride 100 (*)    Glucose, Bld 100 (*)    All other components within normal limits  CBC  TROPONIN I  TROPONIN I   ____________________________________________  EKG  Reviewed and interpreted by me at 1320 Ventricular rate 79 QRS 80 QTC 430 Normal sinus rhythm, no evidence of ischemia or ectopy ____________________________________________  RADIOLOGY  Dg Chest 2 View  Result Date: 12/05/2016 CLINICAL DATA:  Chest pain EXAM: CHEST  2 VIEW COMPARISON:  02/09/2005 FINDINGS: The heart size and mediastinal contours are within normal limits. Both lungs are clear. The visualized skeletal structures are unremarkable. IMPRESSION: No active cardiopulmonary disease. Electronically Signed   By: Misty Stanley M.D.   On: 12/05/2016 14:05    Chest x-ray reviewed, no acute ____________________________________________   PROCEDURES  Procedure(s) performed:  None  Procedures  Critical Care performed: No  ____________________________________________   INITIAL IMPRESSION / ASSESSMENT AND PLAN / ED COURSE  Pertinent labs & imaging results that were available during my care of the patient were reviewed by me and considered in my medical decision making (see chart for details).  Differential diagnosis includes, but is not limited to, ACS, aortic dissection, pulmonary embolism, cardiac tamponade, pneumothorax, pneumonia, pericarditis, myocarditis, GI-related causes including esophagitis/gastritis, and musculoskeletal chest wall pain.    Patient has no associated GI symptoms or abdominal pain.  No ripping tearing or moving pain to suggest dissection.  No dyspnea or tachycardia or hypoxia to suggest pulmonary embolism.  I suspect at this point, chest pain may be musculoskeletal in nature, it is somewhat reproducible to palpation of the left chest wall.  Clinical Course as of Dec 06 1534  Sun Dec 05, 2016  1448 Discomfort now improved to mild. Denies pain, but reports ongoing sense of 'pressure' in mid chest.  [MQ]  1530 Case and care discussed with Dr. Doy Hutching.  He is reviewed her EKG and first troponin at this point, he recommends that if a second troponin is checked and it is normal that he would like for her to be discharged and follow-up in his office would be able to be arranged for this week.  Discussed with the patient, and she reports her symptoms are improved.  She is resting comfortably, blood pressure is improved and she denies any pain at this time.  At this point, I also spoke with Dr. Towanda Malkin and reviewed her case he advised that it would be reasonable to check a second troponin if this is normal that he would be able to follow-up with her as well.  Offered the patient observation to "rule out a heart attack" and for further evaluation of her chest pain, and after discussing risks and benefits and her desires both she and her family  indicate that they would prefer to be discharged and can follow-up tomorrow with Dr. Doy Hutching.  I think this is a reasonable plan I reviewed careful return precautions.  Dr. Joni Fears will follow up on second troponin, likely discharged home thereafter.  [MQ]    Clinical Course User Index [MQ] Delman Kitten, MD   EKG and first troponin are reassuring.  No evidence to  support acute coronary syndrome at this time, and given her symptoms of been ongoing steadily for 24 hours without any waxing or waning, I doubt this represents an acute coronary process.  I would definitely expect her troponin to be elevated at this time she been experiencing 1-2 days of continuous ischemic chest pain.  Suspect likely musculoskeletal  ----------------------------------------- 3:35 PM on 12/05/2016 -----------------------------------------  Ongoing care assigned to Dr. Joni Fears, follow-up on second troponin.  Dr. Joni Fears will page Dr. Doy Hutching to notify him once the troponin results as well, Dr. Doy Hutching would like the opportunity to be able to speak with the patient in the ER prior to her discharge if he is still available at that time.  ----------------------------------------- 3:37 PM on 12/05/2016 -----------------------------------------   OBSERVATION CARE: This patient is being placed under observation care for the following reasons: Chest pain with repeat testing to rule out ischemia  Patient currently reporting 0 pain at the time of start of observation at 3:37 PM.  Reevaluation for end of observation will be completed by Dr. Joni Fears.   ____________________________________________   FINAL CLINICAL IMPRESSION(S) / ED DIAGNOSES  Final diagnoses:  Chest pain in adult      NEW MEDICATIONS STARTED DURING THIS VISIT:  This SmartLink is deprecated. Use AVSMEDLIST instead to display the medication list for a patient.   Note:  This document was prepared using Dragon voice recognition software and may  include unintentional dictation errors.     Delman Kitten, MD 12/05/16 1536    Delman Kitten, MD 12/05/16 1538

## 2016-12-05 NOTE — ED Notes (Signed)
Given something to drink

## 2016-12-05 NOTE — ED Notes (Signed)
Patient transported to X-ray 

## 2016-12-05 NOTE — ED Notes (Signed)
Eating dinner. NAD no needs

## 2016-12-05 NOTE — ED Triage Notes (Signed)
Pt c/o chest pressure that radiates into the back and left arm since Thursday states in the past 2 days the pain has increased. States "I just dont feel well".Marland Kitchen

## 2016-12-05 NOTE — ED Provider Notes (Signed)
Clinical Course as of Dec 06 1910  Megan Harris Dec 05, 2016  1448 Discomfort now improved to mild. Denies pain, but reports ongoing sense of 'pressure' in mid chest.  [MQ]  1530 Case and care discussed with Dr. Doy Hutching.  He is reviewed her EKG and first troponin at this point, he recommends that if a second troponin is checked and it is normal that he would like for her to be discharged and follow-up in his office would be able to be arranged for this week.  Discussed with the patient, and she reports her symptoms are improved.  She is resting comfortably, blood pressure is improved and she denies any pain at this time.  At this point, I also spoke with Dr. Towanda Malkin and reviewed her case he advised that it would be reasonable to check a second troponin if this is normal that he would be able to follow-up with her as well.  Offered the patient observation to "rule out a heart attack" and for further evaluation of her chest pain, and after discussing risks and benefits and her desires both she and her family indicate that they would prefer to be discharged and can follow-up tomorrow with Dr. Doy Hutching.  I think this is a reasonable plan I reviewed careful return precautions.  Dr. Joni Fears will follow up on second troponin, likely discharged home thereafter.  [MQ]  1829 Second trop neg. Sx remain resolved. VSS. Plan for DC home. Dr. Doy Hutching can see pt in 2-3 days in clinic.   [PS]    Clinical Course User Index [MQ] Delman Kitten, MD [PS] Carrie Mew, MD    Final diagnoses:  Chest pain in adult      Carrie Mew, MD 12/05/16 [phone]

## 2016-12-05 NOTE — Discharge Instructions (Signed)

## 2016-12-07 DIAGNOSIS — Z6841 Body Mass Index (BMI) 40.0 and over, adult: Secondary | ICD-10-CM | POA: Diagnosis not present

## 2016-12-07 DIAGNOSIS — I208 Other forms of angina pectoris: Secondary | ICD-10-CM | POA: Diagnosis not present

## 2016-12-07 DIAGNOSIS — I10 Essential (primary) hypertension: Secondary | ICD-10-CM | POA: Diagnosis not present

## 2016-12-07 DIAGNOSIS — E7800 Pure hypercholesterolemia, unspecified: Secondary | ICD-10-CM | POA: Diagnosis not present

## 2016-12-21 DIAGNOSIS — I208 Other forms of angina pectoris: Secondary | ICD-10-CM | POA: Diagnosis not present

## 2017-01-03 DIAGNOSIS — E7800 Pure hypercholesterolemia, unspecified: Secondary | ICD-10-CM | POA: Diagnosis not present

## 2017-01-03 DIAGNOSIS — I10 Essential (primary) hypertension: Secondary | ICD-10-CM | POA: Diagnosis not present

## 2017-01-03 DIAGNOSIS — R0789 Other chest pain: Secondary | ICD-10-CM | POA: Diagnosis not present

## 2017-01-03 DIAGNOSIS — Z6841 Body Mass Index (BMI) 40.0 and over, adult: Secondary | ICD-10-CM | POA: Diagnosis not present

## 2017-01-17 DIAGNOSIS — J019 Acute sinusitis, unspecified: Secondary | ICD-10-CM | POA: Diagnosis not present

## 2017-01-17 DIAGNOSIS — R05 Cough: Secondary | ICD-10-CM | POA: Diagnosis not present

## 2017-02-16 ENCOUNTER — Other Ambulatory Visit: Payer: Self-pay | Admitting: Internal Medicine

## 2017-02-16 DIAGNOSIS — Z1231 Encounter for screening mammogram for malignant neoplasm of breast: Secondary | ICD-10-CM

## 2017-03-14 ENCOUNTER — Ambulatory Visit
Admission: RE | Admit: 2017-03-14 | Discharge: 2017-03-14 | Disposition: A | Discharge status: Home / Self Care | Payer: Medicare HMO | Source: Ambulatory Visit | Attending: Internal Medicine | Admitting: Internal Medicine

## 2017-03-14 DIAGNOSIS — Z1231 Encounter for screening mammogram for malignant neoplasm of breast: Secondary | ICD-10-CM

## 2017-03-17 DIAGNOSIS — M7062 Trochanteric bursitis, left hip: Secondary | ICD-10-CM | POA: Insufficient documentation

## 2017-03-17 DIAGNOSIS — M5432 Sciatica, left side: Secondary | ICD-10-CM | POA: Insufficient documentation

## 2017-04-10 DIAGNOSIS — M1711 Unilateral primary osteoarthritis, right knee: Secondary | ICD-10-CM | POA: Insufficient documentation

## 2017-04-13 ENCOUNTER — Other Ambulatory Visit: Payer: Self-pay

## 2017-04-13 ENCOUNTER — Encounter
Admission: RE | Admit: 2017-04-13 | Discharge: 2017-04-13 | Disposition: A | Discharge status: Home / Self Care | Payer: Medicare HMO | Source: Ambulatory Visit | Attending: Orthopedic Surgery | Admitting: Orthopedic Surgery

## 2017-04-13 DIAGNOSIS — Z01812 Encounter for preprocedural laboratory examination: Secondary | ICD-10-CM | POA: Insufficient documentation

## 2017-04-13 HISTORY — DX: Pneumonia, unspecified organism: J18.9

## 2017-04-13 HISTORY — DX: Other specified postprocedural states: Z98.890

## 2017-04-13 HISTORY — DX: Nausea with vomiting, unspecified: R11.2

## 2017-04-13 LAB — COMPREHENSIVE METABOLIC PANEL
ALK PHOS: 64 U/L (ref 38–126)
ALT: 48 U/L (ref 14–54)
AST: 41 U/L (ref 15–41)
Albumin: 4 g/dL (ref 3.5–5.0)
Anion gap: 12 (ref 5–15)
BUN: 16 mg/dL (ref 6–20)
CALCIUM: 9 mg/dL (ref 8.9–10.3)
CO2: 25 mmol/L (ref 22–32)
CREATININE: 0.8 mg/dL (ref 0.44–1.00)
Chloride: 101 mmol/L (ref 101–111)
GFR calc Af Amer: >60 mL/min (ref >60)
GFR calc non Af Amer: >60 mL/min (ref >60)
Glucose, Bld: 123 mg/dL — ABNORMAL HIGH (ref 65–99)
Potassium: 3.3 mmol/L — ABNORMAL LOW (ref 3.5–5.1)
Sodium: 138 mmol/L (ref 135–145)
Total Bilirubin: 0.8 mg/dL (ref 0.3–1.2)
Total Protein: 7.1 g/dL (ref 6.5–8.1)

## 2017-04-13 LAB — SURGICAL PCR SCREEN
MRSA, PCR: NEGATIVE
Staphylococcus aureus: NEGATIVE

## 2017-04-13 LAB — URINALYSIS, ROUTINE W REFLEX MICROSCOPIC
Bilirubin Urine: NEGATIVE
Glucose, UA: NEGATIVE mg/dL
Hgb urine dipstick: NEGATIVE
Ketones, ur: NEGATIVE mg/dL
LEUKOCYTES UA: NEGATIVE
NITRITE: NEGATIVE
PROTEIN: NEGATIVE mg/dL
Specific Gravity, Urine: 1.015 (ref 1.005–1.030)
pH: 5 (ref 5.0–8.0)

## 2017-04-13 LAB — CBC
HCT: 42.5 % (ref 35.0–47.0)
HEMOGLOBIN: 14.4 g/dL (ref 12.0–16.0)
MCH: 29.8 pg (ref 26.0–34.0)
MCHC: 33.8 g/dL (ref 32.0–36.0)
MCV: 88.1 fL (ref 80.0–100.0)
Platelets: 199 10*3/uL (ref 150–440)
RBC: 4.83 MIL/uL (ref 3.80–5.20)
RDW: 14.2 % (ref 11.5–14.5)
WBC: 5.4 10*3/uL (ref 3.6–11.0)

## 2017-04-13 LAB — HEMOGLOBIN A1C
HEMOGLOBIN A1C: 6.1 % — AB (ref 4.8–5.6)
MEAN PLASMA GLUCOSE: 128.37 mg/dL

## 2017-04-13 LAB — TYPE AND SCREEN
ABO/RH(D): A NEG
Antibody Screen: NEGATIVE

## 2017-04-13 LAB — C-REACTIVE PROTEIN: CRP: 1.1 mg/dL — AB (ref <1.0)

## 2017-04-13 LAB — APTT: aPTT: 28 seconds (ref 24–36)

## 2017-04-13 LAB — PROTIME-INR
INR: 1
Prothrombin Time: 13.1 seconds (ref 11.4–15.2)

## 2017-04-13 LAB — SEDIMENTATION RATE: SED RATE: 12 mm/h (ref 0–30)

## 2017-04-13 NOTE — Patient Instructions (Signed)
Your procedure is scheduled on: Wednesday, April 20, 2017 Report to Day Surgery on the 2nd floor of the [facility]. To find out your arrival time, please call [phone] between 1PM - 3PM on: Tuesday, April 19, 2017  REMEMBER: Instructions that are not followed completely may result in serious medical risk, up to and including death; or upon the discretion of your surgeon and anesthesiologist your surgery may need to be rescheduled.  Do not eat food after midnight the night before your procedure.  No gum chewing, lozengers or hard candies.  You may however, drink CLEAR liquids up to 2 hours before you are scheduled to arrive for your surgery. Do not drink anything within 2 hours of the start of your surgery.  Clear liquids include: - water  - apple juice without pulp - clear gatorade - black coffee or tea (Do NOT add anything to the coffee or tea) Do NOT drink anything that is not on this list.  No Alcohol for 24 hours before or after surgery.  No Smoking including e-cigarettes for 24 hours prior to surgery.  No chewable tobacco products for at least 6 hours prior to surgery.  No nicotine patches on the day of surgery.  On the morning of surgery brush your teeth with toothpaste and water, you may rinse your mouth with mouthwash if you wish. Do not swallow any toothpaste or mouthwash.  Notify your doctor if there is any change in your medical condition (cold, fever, infection).  Do not wear jewelry, make-up, hairpins, clips or nail polish.  Do not wear lotions, powders, or perfumes. You may wear deodorant.  Do not shave 48 hours prior to surgery.   Contacts and dentures may not be worn into surgery.  Do not bring valuables to the hospital, including drivers license, insurance or credit cards.   is not responsible for any belongings or valuables.   TAKE THESE MEDICATIONS THE MORNING OF SURGERY:  1.  AMLODIPINE 2.  ISOSORBIDE 3.  PREVACID   Use CHG Soap as  directed on instruction sheet.  NOW!  Stop Anti-inflammatories (NSAIDS) such as Advil, Aleve, Ibuprofen, Motrin, Naproxen, Naprosyn and Aspirin based products such as Excedrin, Goodys Powder, BC Powder. (May take Tylenol or Acetaminophen if needed.)  NOW!  Stop ANY OVER THE COUNTER supplements until after surgery. (May continue Vitamin D.)  Wear comfortable clothing (specific to your surgery type) to the hospital.  Plan for stool softeners for home use.  If you are being admitted to the hospital overnight, leave your suitcase in the car. After surgery it may be brought to your room.  Please call [phone] if you have any questions about these instructions.

## 2017-04-13 NOTE — Pre-Procedure Instructions (Signed)
Copy and pasted echo results:  Echo complete12/07/2016 [place] Component Name Value Ref Range  LV Ejection Fraction (%) 55   Aortic Valve Stenosis Grade none   Aortic Valve Regurgitation Grade none   Aortic Valve Max Velocity (m/s) 1.4 m/sec  Mitral Valve Stenosis Grade none   Mitral Valve Regurgitation Grade mild   Tricuspid Valve Regurgitation Grade mild   Tricuspid Valve Regurgitation Max Velocity (m/s) 2.6 m/sec  Right Ventricle Systolic Pressure (mmHg) 31.5 mmHg  LV End Diastolic Diameter (cm) 5.1 cm  LV End Systolic Diameter (cm) 3.4 cm  LV Septum Wall Thickness (cm) 1.3 cm  LV Posterior Wall Thickness (cm) 1.2 cm  Left Atrium Diameter (cm) 4 cm  Result Narrative   CARDIOLOGY DEPARTMENT JAYANNA, KROEGER [id number] [facility] Acct #: [id number] [address] [place], [place] [address] Date: 12/21/2016 08:07 AM  Adult Female Age: 85 yrs  ECHOCARDIOGRAM REPORT Outpatient  [facility] STUDY:CHEST WALL TAPE: MD1:KOWALSKI, BRUCE JAY  ECHO:Yes DOPPLER:YesFILE: BP:140/90 mmHg COLOR:YesCONTRAST:NoMACHINE:PhilipsHR: RV BIOPSY:No 3D:No SOUND QLTY:Moderate Height: 65 in  MEDIUM:None Weight:242 lb  BSA: 2.1 m2  ___________________________________________________________________________________________  HISTORY:DOE REASON:Assess, LV function INDICATION:Stable angina pectoris (CMS-HCC) [I20.8  (ICD-10-CM)] ___________________________________________________________________________________________  ECHOCARDIOGRAPHIC MEASUREMENTS 2D DIMENSIONS AORTA ValuesNormal RangeMAIN PAValuesNormal Range Annulus:1.8 cm[2.1 - 2.5]PA Main:nm* [1.5 - 2.1] Aorta Sin:3.2 cm[2.7 - 3.3] RIGHT VENTRICLE ST Junction:nm* [2.3 - 2.9]RV Base:nm* [ <4.2] Asc.Aorta:nm* [2.3 - 3.1] RV Mid:nm* [ <3.5]  LEFT VENTRICLERV Length:2.9 cm[ <8.6] LVIDd:5.1 cm[3.9 - 5.3] INFERIOR VENA CAVA LVIDs:3.4 cmMax. IVC:nm* [ <=2.1]  FS:33.2 %[>25] Min. IVC:nm* SWT:1.3 cm[0.5 - 0.9] ------------------ PWT:1.2 cm[0.5 - 0.9] nm* - not measured  LEFT ATRIUM LA Diam:4.0 cm[2.7 - 3.8] LA A4C Area:nm* [ <20] LA Volume:nm* [22 - 62] ___________________________________________________________________________________________  ECHOCARDIOGRAPHIC DESCRIPTIONS  AORTIC ROOT  Size:Normal  Dissection:INDETERM FOR DISSECTION  AORTIC VALVE  Leaflets:Tricuspid Morphology:Normal  Mobility:Fully mobile  LEFT VENTRICLE  Size:NormalAnterior:Normal Contraction:Normal Lateral:Normal  Closest EF:>55% (Estimated)Septal:Normal LV Masses:No Masses Apical:Normal VOJ:JKKX LVHInferior:Normal  Posterior:Normal   Dias.FxClass:N/A  MITRAL VALVE  Leaflets:NormalMobility:Fully mobile  Morphology:Normal  LEFT ATRIUM  Size:Normal LA Masses:No masses IA Septum:Normal IAS  MAIN PA  Size:Normal  PULMONIC VALVE  Morphology:NormalMobility:Fully mobile  RIGHT VENTRICLE RV Masses:No Masses Size:Normal Free Wall:Normal Contraction:Normal  TRICUSPID VALVE  Leaflets:NormalMobility:Fully mobile  Morphology:Normal  RIGHT ATRIUM  Size:NormalRA Other:None RA Mass:No masses  PERICARDIUM Fluid:No effusion  INFERIOR VENACAVA  Size:Normal Normal respiratory collapse  ___________________________________________________________________________________________  DOPPLER ECHO and OTHER SPECIAL PROCEDURES  Aortic:No ARNo AS 138.8 cm/sec peak vel7.7 mmHg peak grad   Mitral:MILD MRNo MS  3.8 cm^2 by DOPPLER MV Inflow E Vel = 66.0 cm/secMV Annulus E'Vel = 5.6 cm/sec E/E'Ratio = 11.8  Tricuspid:MILD TRNo TS 256.5 cm/sec peak TR vel 31.3 mmHg peak RV pressure  Pulmonary:No PRNo PS 85.5 cm/sec peak vel 2.9 mmHg peak grad    ___________________________________________________________________________________________ INTERPRETATION NORMAL LEFT VENTRICULAR SYSTOLIC FUNCTION WITH MILD LVH NORMAL RIGHT VENTRICULAR SYSTOLIC FUNCTION MILD VALVULAR REGURGITATION (See above) NO  VALVULAR STENOSIS MILD MR, TR EF 55-60%   ___________________________________________________________________________________________  Electronically signed by: MD Serafina Royals on 12/24/2016 10:11 AM Performed By: Francee Piccolo Ordering Physician: Serafina Royals ___________________________________________________________________________________________  Other Result Information  Interface, Text Results In - 12/24/2016 10:12 AM EST                    CARDIOLOGY DEPARTMENT                   [place], [place] [address]  [facility]                      [id number]                   [place] #: [id number]         [address] [place], [place] [address]       Date: 12/21/2016 08:07 AM                                                            Adult Female Age: 67 yrs                    ECHOCARDIOGRAM REPORT                   Outpatient                                                            [facility]           STUDY:CHEST WALL                       TAPE:     MD1:  Serafina Royals JAY            ECHO:Yes   DOPPLER:Yes                FILE:     BP:140/90 mmHg           COLOR:Yes  CONTRAST:No      MACHINE:Philips      HR:       RV BIOPSY:No         3D:No   SOUND QLTY:Moderate     Height: 65 in          MEDIUM:None                                       Weight:242 lb                                                            BSA: 2.1 m2  ___________________________________________________________________________________________        HISTORY:DOE         REASON:Assess, LV function     INDICATION:Stable angina pectoris (CMS-HCC) [I20.8 (ICD-10-CM)] ___________________________________________________________________________________________  ECHOCARDIOGRAPHIC MEASUREMENTS 2D DIMENSIONS AORTA             Values      Normal Range      MAIN PA          Values      Normal Range           Annulus:  1.8 cm    [2.1 -  2.5]                 PA Main:  nm*       [1.5 - 2.1]         Aorta Sin:  3.2 cm    [2.7 - 3.3]       RIGHT VENTRICLE       ST Junction:  nm*       [2.3 - 2.9]                RV Base:  nm*       [ <4.2]         Asc.Aorta:  nm*       [2.3 - 3.1]                 RV Mid:  nm*       [ <3.5]  LEFT VENTRICLE                                        RV Length:  2.9 cm    [ <8.6]             LVIDd:  5.1 cm    [3.9 - 5.3]       INFERIOR VENA CAVA             LVIDs:  3.4 cm                              Max. IVC:  nm*       [ <=2.1]                FS:  33.2 %    [>25]                     Min. IVC:  nm*               SWT:  1.3 cm    [0.5 - 0.9]                   ------------------               PWT:  1.2 cm    [0.5 - 0.9]                   nm* - not measured  LEFT ATRIUM           LA Diam:  4.0 cm    [2.7 - 3.8]       LA A4C Area:  nm*       [ <20]         LA Volume:  nm*       [22 - 52] ___________________________________________________________________________________________  ECHOCARDIOGRAPHIC DESCRIPTIONS  AORTIC ROOT          Size:Normal    Dissection:INDETERM FOR DISSECTION  AORTIC VALVE      Leaflets:Tricuspid                           Morphology:Normal      Mobility:Fully mobile  LEFT VENTRICLE          Size:Normal  Anterior:Normal   Contraction:Normal                                 Lateral:Normal    Closest EF:>55% (Estimated)                        Septal:Normal     LV Masses:No Masses                               Apical:Normal           SHF:WYOV LVH                              Inferior:Normal                                                    Posterior:Normal  Dias.FxClass:N/A  MITRAL VALVE      Leaflets:Normal                                Mobility:Fully mobile    Morphology:Normal  LEFT ATRIUM          Size:Normal                               LA Masses:No masses     IA Septum:Normal IAS  MAIN PA          Size:Normal  PULMONIC VALVE     Morphology:Normal                                Mobility:Fully mobile  RIGHT VENTRICLE     RV Masses:No Masses                                 Size:Normal     Free Wall:Normal                             Contraction:Normal  TRICUSPID VALVE      Leaflets:Normal                                Mobility:Fully mobile    Morphology:Normal  RIGHT ATRIUM          Size:Normal                                RA Other:None       RA Mass:No masses  PERICARDIUM         Fluid:No effusion  INFERIOR VENACAVA          Size:Normal Normal respiratory collapse  ___________________________________________________________________________________________  DOPPLER ECHO and OTHER SPECIAL PROCEDURES          Aortic:No AR  No AS                 138.8 cm/sec peak vel                  7.7 mmHg peak grad           Mitral:MILD MR                                No MS                                                        3.8 cm^2 by DOPPLER                 MV Inflow E Vel = 66.0 cm/sec          MV Annulus E'Vel = 5.6 cm/sec                 E/E'Ratio = 11.8        Tricuspid:MILD TR                                No TS                 256.5 cm/sec peak TR vel               31.3 mmHg peak RV pressure        Pulmonary:No PR                                  No PS                 85.5 cm/sec peak vel                   2.9 mmHg peak grad    ___________________________________________________________________________________________ INTERPRETATION NORMAL LEFT VENTRICULAR SYSTOLIC FUNCTION   WITH MILD LVH NORMAL RIGHT VENTRICULAR SYSTOLIC FUNCTION MILD VALVULAR REGURGITATION (See above) NO VALVULAR STENOSIS MILD MR, TR EF 55-60%   ___________________________________________________________________________________________  Electronically signed by: MD Serafina Royals on 12/24/2016 10:11 AM             Performed By: Francee Piccolo       Ordering Physician: Serafina Royals

## 2017-04-14 LAB — URINE CULTURE: SPECIAL REQUESTS: NORMAL

## 2017-04-18 LAB — IGE: IGE (IMMUNOGLOBULIN E), SERUM: 10 [IU]/mL (ref 6–495)

## 2017-04-19 MED ORDER — TRANEXAMIC ACID 1000 MG/10ML IV SOLN
1000.0000 mg | INTRAVENOUS | Status: DC
  Filled 2017-04-19: qty 10

## 2017-04-19 MED ORDER — CLINDAMYCIN PHOSPHATE 900 MG/50ML IV SOLN: 900.0000 mg | INTRAVENOUS | Status: DC

## 2017-04-20 ENCOUNTER — Encounter
Admission: RE | Disposition: A | Discharge status: Home Health | Payer: Self-pay | Source: Ambulatory Visit | Attending: Orthopedic Surgery

## 2017-04-20 ENCOUNTER — Encounter: Payer: Self-pay | Admitting: Orthopedic Surgery

## 2017-04-20 ENCOUNTER — Inpatient Hospital Stay: Payer: Medicare HMO

## 2017-04-20 ENCOUNTER — Inpatient Hospital Stay
Admission: RE | Admit: 2017-04-20 | Discharge: 2017-04-22 | DRG: 470 | Disposition: A | Discharge status: Home Health | Payer: Medicare HMO | Source: Ambulatory Visit | Attending: Orthopedic Surgery | Admitting: Orthopedic Surgery

## 2017-04-20 ENCOUNTER — Inpatient Hospital Stay: Payer: Medicare HMO | Admitting: Anesthesiology

## 2017-04-20 ENCOUNTER — Other Ambulatory Visit: Payer: Self-pay

## 2017-04-20 DIAGNOSIS — Z880 Allergy status to penicillin: Secondary | ICD-10-CM

## 2017-04-20 DIAGNOSIS — J449 Chronic obstructive pulmonary disease, unspecified: Secondary | ICD-10-CM | POA: Diagnosis present

## 2017-04-20 DIAGNOSIS — Z7982 Long term (current) use of aspirin: Secondary | ICD-10-CM

## 2017-04-20 DIAGNOSIS — Z885 Allergy status to narcotic agent status: Secondary | ICD-10-CM

## 2017-04-20 DIAGNOSIS — K219 Gastro-esophageal reflux disease without esophagitis: Secondary | ICD-10-CM | POA: Diagnosis present

## 2017-04-20 DIAGNOSIS — Z8719 Personal history of other diseases of the digestive system: Secondary | ICD-10-CM | POA: Insufficient documentation

## 2017-04-20 DIAGNOSIS — J45909 Unspecified asthma, uncomplicated: Secondary | ICD-10-CM | POA: Insufficient documentation

## 2017-04-20 DIAGNOSIS — M5030 Other cervical disc degeneration, unspecified cervical region: Secondary | ICD-10-CM | POA: Insufficient documentation

## 2017-04-20 DIAGNOSIS — M1711 Unilateral primary osteoarthritis, right knee: Principal | ICD-10-CM | POA: Diagnosis present

## 2017-04-20 DIAGNOSIS — Z79899 Other long term (current) drug therapy: Secondary | ICD-10-CM | POA: Diagnosis not present

## 2017-04-20 DIAGNOSIS — I10 Essential (primary) hypertension: Secondary | ICD-10-CM | POA: Insufficient documentation

## 2017-04-20 DIAGNOSIS — F5104 Psychophysiologic insomnia: Secondary | ICD-10-CM | POA: Insufficient documentation

## 2017-04-20 DIAGNOSIS — Z6841 Body Mass Index (BMI) 40.0 and over, adult: Secondary | ICD-10-CM

## 2017-04-20 DIAGNOSIS — Z96659 Presence of unspecified artificial knee joint: Secondary | ICD-10-CM

## 2017-04-20 DIAGNOSIS — E559 Vitamin D deficiency, unspecified: Secondary | ICD-10-CM | POA: Insufficient documentation

## 2017-04-20 DIAGNOSIS — Z91048 Other nonmedicinal substance allergy status: Secondary | ICD-10-CM

## 2017-04-20 DIAGNOSIS — Z8711 Personal history of peptic ulcer disease: Secondary | ICD-10-CM | POA: Insufficient documentation

## 2017-04-20 DIAGNOSIS — E785 Hyperlipidemia, unspecified: Secondary | ICD-10-CM | POA: Diagnosis present

## 2017-04-20 HISTORY — PX: KNEE ARTHROPLASTY: SHX992

## 2017-04-20 LAB — ABO/RH: ABO/RH(D): A NEG

## 2017-04-20 SURGERY — ARTHROPLASTY, KNEE, TOTAL, USING IMAGELESS COMPUTER-ASSISTED NAVIGATION
Anesthesia: Spinal | Site: Knee | Laterality: Right | Wound class: Clean

## 2017-04-20 MED ORDER — LACTATED RINGERS IV SOLN
INTRAVENOUS | Status: DC
  Administered 2017-04-20 (×2): via INTRAVENOUS

## 2017-04-20 MED ORDER — NEOMYCIN-POLYMYXIN B GU 40-200000 IR SOLN
Status: DC | PRN
  Administered 2017-04-20: 14 mL

## 2017-04-20 MED ORDER — DIPHENHYDRAMINE HCL 12.5 MG/5ML PO ELIX: 12.5000 mg | ORAL_SOLUTION | ORAL | Status: DC | PRN

## 2017-04-20 MED ORDER — SCOPOLAMINE 1 MG/3DAYS TD PT72
1.0000 | MEDICATED_PATCH | Freq: Once | TRANSDERMAL | Status: DC
  Administered 2017-04-20: 1.5 mg via TRANSDERMAL

## 2017-04-20 MED ORDER — GABAPENTIN 300 MG PO CAPS
300.0000 mg | ORAL_CAPSULE | Freq: Once | ORAL | Status: AC
  Administered 2017-04-20: 300 mg via ORAL

## 2017-04-20 MED ORDER — DEXAMETHASONE SODIUM PHOSPHATE 10 MG/ML IJ SOLN
8.0000 mg | Freq: Once | INTRAMUSCULAR | Status: AC
  Administered 2017-04-20: 8 mg via INTRAVENOUS

## 2017-04-20 MED ORDER — TRANEXAMIC ACID 1000 MG/10ML IV SOLN
1000.0000 mg | Freq: Once | INTRAVENOUS | Status: AC
  Administered 2017-04-20: 1000 mg via INTRAVENOUS
  Filled 2017-04-20: qty 10

## 2017-04-20 MED ORDER — CEFAZOLIN SODIUM-DEXTROSE 2-4 GM/100ML-% IV SOLN
2.0000 g | Freq: Four times a day (QID) | INTRAVENOUS | Status: AC
  Administered 2017-04-20 – 2017-04-21 (×4): 2 g via INTRAVENOUS
  Filled 2017-04-20 (×4): qty 100

## 2017-04-20 MED ORDER — ONDANSETRON HCL 4 MG/2ML IJ SOLN: 4.0000 mg | Freq: Four times a day (QID) | INTRAMUSCULAR | Status: DC | PRN

## 2017-04-20 MED ORDER — GABAPENTIN 300 MG PO CAPS
300.0000 mg | ORAL_CAPSULE | Freq: Every day | ORAL | Status: DC
  Administered 2017-04-20 – 2017-04-21 (×2): 300 mg via ORAL
  Filled 2017-04-20 (×2): qty 1

## 2017-04-20 MED ORDER — LOSARTAN POTASSIUM 50 MG PO TABS
100.0000 mg | ORAL_TABLET | Freq: Every day | ORAL | Status: DC
  Administered 2017-04-21 – 2017-04-22 (×2): 100 mg via ORAL
  Filled 2017-04-20 (×2): qty 2

## 2017-04-20 MED ORDER — HYDROMORPHONE HCL 1 MG/ML IJ SOLN: 0.5000 mg | INTRAMUSCULAR | Status: DC | PRN

## 2017-04-20 MED ORDER — ACETAMINOPHEN 10 MG/ML IV SOLN
1000.0000 mg | Freq: Four times a day (QID) | INTRAVENOUS | Status: AC
  Administered 2017-04-20 – 2017-04-21 (×4): 1000 mg via INTRAVENOUS
  Filled 2017-04-20 (×5): qty 100

## 2017-04-20 MED ORDER — BISACODYL 10 MG RE SUPP
10.0000 mg | Freq: Every day | RECTAL | Status: DC | PRN
  Administered 2017-04-22: 10 mg via RECTAL
  Filled 2017-04-20: qty 1

## 2017-04-20 MED ORDER — ZOLPIDEM TARTRATE 5 MG PO TABS
5.0000 mg | ORAL_TABLET | Freq: Every evening | ORAL | Status: DC | PRN
  Administered 2017-04-20 – 2017-04-21 (×2): 5 mg via ORAL
  Filled 2017-04-20 (×2): qty 1

## 2017-04-20 MED ORDER — ACETAMINOPHEN 10 MG/ML IV SOLN
INTRAVENOUS | Status: DC | PRN
  Administered 2017-04-20: 1000 mg via INTRAVENOUS

## 2017-04-20 MED ORDER — CELECOXIB 200 MG PO CAPS
400.0000 mg | ORAL_CAPSULE | Freq: Once | ORAL | Status: AC
  Administered 2017-04-20: 400 mg via ORAL

## 2017-04-20 MED ORDER — PROPOFOL 500 MG/50ML IV EMUL
INTRAVENOUS | Status: DC | PRN
  Administered 2017-04-20: 50 ug/kg/min via INTRAVENOUS

## 2017-04-20 MED ORDER — SODIUM CHLORIDE 0.9 % IV SOLN
INTRAVENOUS | Status: DC
  Administered 2017-04-20: 21:00:00 via INTRAVENOUS

## 2017-04-20 MED ORDER — METOCLOPRAMIDE HCL 10 MG PO TABS: 5.0000 mg | ORAL_TABLET | Freq: Three times a day (TID) | ORAL | Status: DC | PRN

## 2017-04-20 MED ORDER — ALUM & MAG HYDROXIDE-SIMETH 200-200-20 MG/5ML PO SUSP: 30.0000 mL | ORAL | Status: DC | PRN

## 2017-04-20 MED ORDER — TRANEXAMIC ACID 1000 MG/10ML IV SOLN
INTRAVENOUS | Status: DC | PRN
  Administered 2017-04-20: 1000 mg via INTRAVENOUS

## 2017-04-20 MED ORDER — HYDROCHLOROTHIAZIDE 25 MG PO TABS
25.0000 mg | ORAL_TABLET | Freq: Every day | ORAL | Status: DC
  Administered 2017-04-21 – 2017-04-22 (×2): 25 mg via ORAL
  Filled 2017-04-20 (×2): qty 1

## 2017-04-20 MED ORDER — CELECOXIB 200 MG PO CAPS
200.0000 mg | ORAL_CAPSULE | Freq: Two times a day (BID) | ORAL | Status: DC
  Administered 2017-04-20 – 2017-04-22 (×4): 200 mg via ORAL
  Filled 2017-04-20 (×4): qty 1

## 2017-04-20 MED ORDER — VITAMIN D 1000 UNITS PO TABS
5000.0000 [IU] | ORAL_TABLET | Freq: Every day | ORAL | Status: DC
  Administered 2017-04-21 – 2017-04-22 (×2): 5000 [IU] via ORAL
  Filled 2017-04-20 (×2): qty 5

## 2017-04-20 MED ORDER — MENTHOL 3 MG MT LOZG
1.0000 | LOZENGE | OROMUCOSAL | Status: DC | PRN
  Filled 2017-04-20: qty 9

## 2017-04-20 MED ORDER — SODIUM CHLORIDE 0.9 % IV SOLN
INTRAVENOUS | Status: DC | PRN
  Administered 2017-04-20: 25 ug/min via INTRAVENOUS

## 2017-04-20 MED ORDER — ONDANSETRON HCL 4 MG/2ML IJ SOLN: 4.0000 mg | Freq: Once | INTRAMUSCULAR | Status: DC | PRN

## 2017-04-20 MED ORDER — OXYCODONE HCL 5 MG PO TABS
5.0000 mg | ORAL_TABLET | ORAL | Status: DC | PRN
  Administered 2017-04-20 – 2017-04-22 (×2): 5 mg via ORAL
  Filled 2017-04-20 (×2): qty 1

## 2017-04-20 MED ORDER — OXYCODONE HCL 5 MG PO TABS
10.0000 mg | ORAL_TABLET | ORAL | Status: DC | PRN
  Administered 2017-04-21 – 2017-04-22 (×6): 10 mg via ORAL
  Filled 2017-04-20 (×6): qty 2

## 2017-04-20 MED ORDER — DEXAMETHASONE SODIUM PHOSPHATE 10 MG/ML IJ SOLN
INTRAMUSCULAR | Status: AC
  Administered 2017-04-20: 8 mg via INTRAVENOUS
  Filled 2017-04-20: qty 1

## 2017-04-20 MED ORDER — MONTELUKAST SODIUM 10 MG PO TABS
10.0000 mg | ORAL_TABLET | Freq: Every day | ORAL | Status: DC
  Administered 2017-04-20 – 2017-04-21 (×2): 10 mg via ORAL
  Filled 2017-04-20 (×2): qty 1

## 2017-04-20 MED ORDER — CELECOXIB 200 MG PO CAPS
ORAL_CAPSULE | ORAL | Status: AC
  Administered 2017-04-20: 400 mg via ORAL
  Filled 2017-04-20: qty 2

## 2017-04-20 MED ORDER — MAGNESIUM HYDROXIDE 400 MG/5ML PO SUSP
30.0000 mL | Freq: Every day | ORAL | Status: DC | PRN
  Administered 2017-04-21: 30 mL via ORAL
  Filled 2017-04-20: qty 30

## 2017-04-20 MED ORDER — PROPOFOL 10 MG/ML IV BOLUS
INTRAVENOUS | Status: DC | PRN
  Administered 2017-04-20: 40 mg via INTRAVENOUS
  Administered 2017-04-20: 30 mg via INTRAVENOUS
  Administered 2017-04-20: 20 mg via INTRAVENOUS

## 2017-04-20 MED ORDER — GABAPENTIN 300 MG PO CAPS
ORAL_CAPSULE | ORAL | Status: AC
  Administered 2017-04-20: 300 mg via ORAL
  Filled 2017-04-20: qty 1

## 2017-04-20 MED ORDER — SODIUM CHLORIDE 0.9 % IV SOLN
INTRAVENOUS | Status: DC | PRN
  Administered 2017-04-20: 60 mL

## 2017-04-20 MED ORDER — FLEET ENEMA 7-19 GM/118ML RE ENEM: 1.0000 | ENEMA | Freq: Once | RECTAL | Status: DC | PRN

## 2017-04-20 MED ORDER — FENTANYL CITRATE (PF) 100 MCG/2ML IJ SOLN
INTRAMUSCULAR | Status: DC | PRN
  Administered 2017-04-20 (×2): 50 ug via INTRAVENOUS

## 2017-04-20 MED ORDER — BUPIVACAINE HCL (PF) 0.25 % IJ SOLN
INTRAMUSCULAR | Status: DC | PRN
  Administered 2017-04-20: 60 mL

## 2017-04-20 MED ORDER — FENTANYL CITRATE (PF) 100 MCG/2ML IJ SOLN
25.0000 ug | INTRAMUSCULAR | Status: DC | PRN
  Administered 2017-04-20 (×4): 25 ug via INTRAVENOUS

## 2017-04-20 MED ORDER — LOSARTAN POTASSIUM-HCTZ 100-25 MG PO TABS: 1.0000 | ORAL_TABLET | Freq: Every day | ORAL | Status: DC

## 2017-04-20 MED ORDER — ACETAMINOPHEN 325 MG PO TABS: 325.0000 mg | ORAL_TABLET | Freq: Four times a day (QID) | ORAL | Status: DC | PRN

## 2017-04-20 MED ORDER — FERROUS SULFATE 325 (65 FE) MG PO TABS
325.0000 mg | ORAL_TABLET | Freq: Two times a day (BID) | ORAL | Status: DC
  Administered 2017-04-21 – 2017-04-22 (×3): 325 mg via ORAL
  Filled 2017-04-20 (×3): qty 1

## 2017-04-20 MED ORDER — METOCLOPRAMIDE HCL 5 MG/ML IJ SOLN: 5.0000 mg | Freq: Three times a day (TID) | INTRAMUSCULAR | Status: DC | PRN

## 2017-04-20 MED ORDER — AMLODIPINE BESYLATE 5 MG PO TABS
5.0000 mg | ORAL_TABLET | Freq: Every day | ORAL | Status: DC
  Administered 2017-04-21 – 2017-04-22 (×2): 5 mg via ORAL
  Filled 2017-04-20 (×2): qty 1

## 2017-04-20 MED ORDER — LORATADINE 10 MG PO TABS
10.0000 mg | ORAL_TABLET | Freq: Every day | ORAL | Status: DC
  Administered 2017-04-21 – 2017-04-22 (×2): 10 mg via ORAL
  Filled 2017-04-20 (×2): qty 1

## 2017-04-20 MED ORDER — CLINDAMYCIN PHOSPHATE 900 MG/50ML IV SOLN
INTRAVENOUS | Status: DC | PRN
  Administered 2017-04-20: 900 mg via INTRAVENOUS

## 2017-04-20 MED ORDER — CHLORHEXIDINE GLUCONATE 4 % EX LIQD: 60.0000 mL | Freq: Once | CUTANEOUS | Status: DC

## 2017-04-20 MED ORDER — ONDANSETRON HCL 4 MG PO TABS
4.0000 mg | ORAL_TABLET | Freq: Four times a day (QID) | ORAL | Status: DC | PRN
  Administered 2017-04-22: 4 mg via ORAL
  Filled 2017-04-20: qty 1

## 2017-04-20 MED ORDER — SCOPOLAMINE 1 MG/3DAYS TD PT72
MEDICATED_PATCH | TRANSDERMAL | Status: AC
  Administered 2017-04-20: 1.5 mg via TRANSDERMAL
  Filled 2017-04-20: qty 1

## 2017-04-20 MED ORDER — LIDOCAINE HCL (CARDIAC) 20 MG/ML IV SOLN
INTRAVENOUS | Status: DC | PRN
  Administered 2017-04-20: 50 mg via INTRAVENOUS

## 2017-04-20 MED ORDER — TRANEXAMIC ACID 1000 MG/10ML IV SOLN: INTRAVENOUS | Status: DC | PRN

## 2017-04-20 MED ORDER — CLINDAMYCIN PHOSPHATE 900 MG/50ML IV SOLN
INTRAVENOUS | Status: AC
  Filled 2017-04-20: qty 50

## 2017-04-20 MED ORDER — MIDAZOLAM HCL 5 MG/5ML IJ SOLN
INTRAMUSCULAR | Status: DC | PRN
  Administered 2017-04-20: 2 mg via INTRAVENOUS

## 2017-04-20 MED ORDER — ISOSORBIDE MONONITRATE ER 30 MG PO TB24
30.0000 mg | ORAL_TABLET | Freq: Every day | ORAL | Status: DC
  Administered 2017-04-21 – 2017-04-22 (×2): 30 mg via ORAL
  Filled 2017-04-20 (×2): qty 1

## 2017-04-20 MED ORDER — PHENOL 1.4 % MT LIQD
1.0000 | OROMUCOSAL | Status: DC | PRN
  Filled 2017-04-20: qty 177

## 2017-04-20 MED ORDER — TRAMADOL HCL 50 MG PO TABS
50.0000 mg | ORAL_TABLET | ORAL | Status: DC | PRN
  Administered 2017-04-20 – 2017-04-22 (×3): 100 mg via ORAL
  Filled 2017-04-20 (×3): qty 2

## 2017-04-20 MED ORDER — SENNOSIDES-DOCUSATE SODIUM 8.6-50 MG PO TABS
1.0000 | ORAL_TABLET | Freq: Two times a day (BID) | ORAL | Status: DC
  Administered 2017-04-20 – 2017-04-22 (×4): 1 via ORAL
  Filled 2017-04-20 (×4): qty 1

## 2017-04-20 MED ORDER — ATORVASTATIN CALCIUM 10 MG PO TABS
10.0000 mg | ORAL_TABLET | Freq: Every evening | ORAL | Status: DC
  Administered 2017-04-21: 10 mg via ORAL
  Filled 2017-04-20: qty 1

## 2017-04-20 MED ORDER — PANTOPRAZOLE SODIUM 40 MG PO TBEC
40.0000 mg | DELAYED_RELEASE_TABLET | Freq: Two times a day (BID) | ORAL | Status: DC
  Administered 2017-04-20 – 2017-04-22 (×4): 40 mg via ORAL
  Filled 2017-04-20 (×4): qty 1

## 2017-04-20 MED ORDER — CALCIUM CITRATE-VITAMIN D 500-500 MG-UNIT PO CHEW
CHEWABLE_TABLET | Freq: Every day | ORAL | Status: DC
  Administered 2017-04-21 – 2017-04-22 (×2): 1 via ORAL
  Filled 2017-04-20 (×2): qty 1

## 2017-04-20 MED ORDER — ENOXAPARIN SODIUM 30 MG/0.3ML ~~LOC~~ SOLN
30.0000 mg | Freq: Two times a day (BID) | SUBCUTANEOUS | Status: DC
  Administered 2017-04-21 – 2017-04-22 (×3): 30 mg via SUBCUTANEOUS
  Filled 2017-04-20 (×3): qty 0.3

## 2017-04-20 MED ORDER — FENTANYL CITRATE (PF) 100 MCG/2ML IJ SOLN
INTRAMUSCULAR | Status: AC
  Administered 2017-04-20: 25 ug via INTRAVENOUS
  Filled 2017-04-20: qty 2

## 2017-04-20 MED ORDER — METOCLOPRAMIDE HCL 10 MG PO TABS
10.0000 mg | ORAL_TABLET | Freq: Three times a day (TID) | ORAL | Status: DC
  Administered 2017-04-20 – 2017-04-22 (×7): 10 mg via ORAL
  Filled 2017-04-20 (×7): qty 1

## 2017-04-20 SURGICAL SUPPLY — 70 items
BATTERY INSTRU NAVIGATION (MISCELLANEOUS) ×8 IMPLANT
BLADE SAW 1 (BLADE) ×2 IMPLANT
BLADE SAW 1/2 (BLADE) ×2 IMPLANT
BLADE SAW 70X12.5 (BLADE) IMPLANT
BONE CEMENT GENTAMICIN (Cement) ×4 IMPLANT
CANISTER SUCT 1200ML W/VALVE (MISCELLANEOUS) ×2 IMPLANT
CANISTER SUCT 3000ML PPV (MISCELLANEOUS) ×4 IMPLANT
CAPT KNEE TOTAL 3 ATTUNE ×2 IMPLANT
CEMENT BONE GENTAMICIN 40 (Cement) ×2 IMPLANT
COOLER POLAR GLACIER W/PUMP (MISCELLANEOUS) ×2 IMPLANT
CUFF TOURN 24 STER (MISCELLANEOUS) IMPLANT
CUFF TOURN 30 STER DUAL PORT (MISCELLANEOUS) ×2 IMPLANT
DRAPE SHEET LG 3/4 BI-LAMINATE (DRAPES) ×2 IMPLANT
DRSG DERMACEA 8X12 NADH (GAUZE/BANDAGES/DRESSINGS) ×2 IMPLANT
DRSG OPSITE POSTOP 4X14 (GAUZE/BANDAGES/DRESSINGS) ×2 IMPLANT
DRSG TEGADERM 4X4.75 (GAUZE/BANDAGES/DRESSINGS) ×2 IMPLANT
DURAPREP 26ML APPLICATOR (WOUND CARE) ×4 IMPLANT
ELECT CAUTERY BLADE 6.4 (BLADE) ×2 IMPLANT
ELECT REM PT RETURN 9FT ADLT (ELECTROSURGICAL) ×2
ELECTRODE REM PT RTRN 9FT ADLT (ELECTROSURGICAL) ×1 IMPLANT
EVACUATOR 1/8 PVC DRAIN (DRAIN) ×2 IMPLANT
EX-PIN ORTHOLOCK NAV 4X150 (PIN) ×4 IMPLANT
GLOVE BIOGEL M STRL SZ7.5 (GLOVE) ×4 IMPLANT
GLOVE BIOGEL PI IND STRL 7.0 (GLOVE) ×5 IMPLANT
GLOVE BIOGEL PI IND STRL 9 (GLOVE) ×1 IMPLANT
GLOVE BIOGEL PI INDICATOR 7.0 (GLOVE) ×5
GLOVE BIOGEL PI INDICATOR 9 (GLOVE) ×1
GLOVE INDICATOR 8.0 STRL GRN (GLOVE) ×2 IMPLANT
GLOVE SURG SYN 9.0  PF PI (GLOVE) ×1
GLOVE SURG SYN 9.0 PF PI (GLOVE) ×1 IMPLANT
GOWN STRL REUS W/ TWL LRG LVL3 (GOWN DISPOSABLE) ×2 IMPLANT
GOWN STRL REUS W/TWL 2XL LVL3 (GOWN DISPOSABLE) ×4 IMPLANT
GOWN STRL REUS W/TWL LRG LVL3 (GOWN DISPOSABLE) ×2
HOLDER FOLEY CATH W/STRAP (MISCELLANEOUS) ×2 IMPLANT
HOOD PEEL AWAY FLYTE STAYCOOL (MISCELLANEOUS) ×4 IMPLANT
KIT TURNOVER KIT A (KITS) ×2 IMPLANT
KNIFE SCULPS 14X20 (INSTRUMENTS) ×2 IMPLANT
LABEL OR SOLS (LABEL) ×2 IMPLANT
NDL SAFETY ECLIPSE 18X1.5 (NEEDLE) ×1 IMPLANT
NEEDLE HYPO 18GX1.5 SHARP (NEEDLE) ×1
NEEDLE SPNL 20GX3.5 QUINCKE YW (NEEDLE) ×4 IMPLANT
NS IRRIG 500ML POUR BTL (IV SOLUTION) ×2 IMPLANT
PACK TOTAL KNEE (MISCELLANEOUS) ×2 IMPLANT
PAD WRAPON POLAR KNEE (MISCELLANEOUS) IMPLANT
PAD WRAPON POLOR MULTI XL (MISCELLANEOUS) ×1 IMPLANT
PIN DRILL QUICK PACK ×2 IMPLANT
PIN FIXATION 1/8DIA X 3INL (PIN) ×2 IMPLANT
PULSAVAC PLUS IRRIG FAN TIP (DISPOSABLE) ×2
SOL .9 NS 3000ML IRR  AL (IV SOLUTION) ×1
SOL .9 NS 3000ML IRR UROMATIC (IV SOLUTION) ×1 IMPLANT
SOL PREP PVP 2OZ (MISCELLANEOUS) ×2
SOLUTION PREP PVP 2OZ (MISCELLANEOUS) ×1 IMPLANT
SPONGE DRAIN TRACH 4X4 STRL 2S (GAUZE/BANDAGES/DRESSINGS) ×2 IMPLANT
STAPLER SKIN PROX 35W (STAPLE) ×2 IMPLANT
STOCKINETTE IMPERV 14X48 (MISCELLANEOUS) ×2 IMPLANT
STRAP TIBIA SHORT (MISCELLANEOUS) ×2 IMPLANT
SUCTION FRAZIER HANDLE 10FR (MISCELLANEOUS) ×1
SUCTION TUBE FRAZIER 10FR DISP (MISCELLANEOUS) ×1 IMPLANT
SUT VIC AB 0 CT1 36 (SUTURE) ×2 IMPLANT
SUT VIC AB 1 CT1 36 (SUTURE) ×4 IMPLANT
SUT VIC AB 2-0 CT2 27 (SUTURE) ×2 IMPLANT
SYR 20CC LL (SYRINGE) ×2 IMPLANT
SYR 30ML LL (SYRINGE) ×4 IMPLANT
TIP FAN IRRIG PULSAVAC PLUS (DISPOSABLE) ×1 IMPLANT
TOWEL OR 17X26 4PK STRL BLUE (TOWEL DISPOSABLE) ×2 IMPLANT
TOWER CARTRIDGE SMART MIX (DISPOSABLE) ×2 IMPLANT
TRAY FOLEY W/METER SILVER 16FR (SET/KITS/TRAYS/PACK) ×2 IMPLANT
WRAP-ON POLOR PAD MULTI XL (MISCELLANEOUS) ×1
WRAPON POLAR PAD KNEE (MISCELLANEOUS)
WRAPON POLOR PAD MULTI XL (MISCELLANEOUS) ×1

## 2017-04-20 NOTE — Anesthesia Procedure Notes (Signed)
Date/Time: 04/20/2017 3:20 PM Performed by: Johnna Acosta, CRNA Pre-anesthesia Checklist: Patient identified, Emergency Drugs available, Suction available, Patient being monitored and Timeout performed Patient Re-evaluated:Patient Re-evaluated prior to induction Oxygen Delivery Method: Simple face mask Preoxygenation: Pre-oxygenation with 100% oxygen

## 2017-04-20 NOTE — Anesthesia Procedure Notes (Signed)
Performed by: Rheagan Nayak, CRNA       

## 2017-04-20 NOTE — Discharge Instructions (Signed)
°  Instructions after Total Knee Replacement ° ° Chritopher Coster P. Skylor Schnapp, Jr., M.D.    ° Dept. of Orthopaedics & Sports Medicine ° [facility] ° [address] ° [place], [place]  [address] ° Phone: [phone]   Fax: [phone] ° °  °DIET: °• Drink plenty of non-alcoholic fluids. °• Resume your normal diet. Include foods high in fiber. ° °ACTIVITY:  °• You may use crutches or a walker with weight-bearing as tolerated, unless instructed otherwise. °• You may be weaned off of the walker or crutches by your Physical Therapist.  °• Do NOT place pillows under the knee. Anything placed under the knee could limit your ability to straighten the knee.   °• Continue doing gentle exercises. Exercising will reduce the pain and swelling, increase motion, and prevent muscle weakness.   °• Please continue to use the TED compression stockings for 6 weeks. You may remove the stockings at night, but should reapply them in the morning. °• Do not drive or operate any equipment until instructed. ° °WOUND CARE:  °• Continue to use the PolarCare or ice packs periodically to reduce pain and swelling. °• You may bathe or shower after the staples are removed at the first office visit following surgery. ° °MEDICATIONS: °• You may resume your regular medications. °• Please take the pain medication as prescribed on the medication. °• Do not take pain medication on an empty stomach. °• You have been given a prescription for a blood thinner (Lovenox or Coumadin). Please take the medication as instructed. (NOTE: After completing a 2 week course of Lovenox, take one Enteric-coated aspirin once a day. This along with elevation will help reduce the possibility of phlebitis in your operated leg.) °• Do not drive or drink alcoholic beverages when taking pain medications. ° °CALL THE OFFICE FOR: °• Temperature above 101 degrees °• Excessive bleeding or drainage on the dressing. °• Excessive swelling, coldness, or paleness of the toes. °• Persistent  nausea and vomiting. ° °FOLLOW-UP:  °• You should have an appointment to return to the office in 10-14 days after surgery. °• Arrangements have been made for continuation of Physical Therapy (either home therapy or outpatient therapy). °  °

## 2017-04-20 NOTE — OR Nursing (Signed)
Patient has potassium drawn at [facility] yesterday result 4.3. ISTAT cancelled per Dr Ronelle Nigh.

## 2017-04-20 NOTE — Op Note (Signed)
OPERATIVE NOTE  DATE OF SURGERY:  04/20/2017  PATIENT NAME:  Megan Harris   DOB: 12/12/1950  MRN: [id number]  PRE-OPERATIVE DIAGNOSIS: Degenerative arthrosis of the right knee, primary  POST-OPERATIVE DIAGNOSIS:  Same  PROCEDURE:  Right total knee arthroplasty using computer-assisted navigation  SURGEON:  Marciano Sequin. M.D.  ASSISTANT:  Vance Peper, PA (present and scrubbed throughout the case, critical for assistance with exposure, retraction, instrumentation, and closure)  ANESTHESIA: spinal  ESTIMATED BLOOD LOSS: 50 mL  FLUIDS REPLACED: 1200 mL of crystalloid  TOURNIQUET TIME: 80 minutes  DRAINS: 2 medium Hemovac drains  SOFT TISSUE RELEASES: Anterior cruciate ligament, posterior cruciate ligament, deep medial collateral ligament, patellofemoral ligament  IMPLANTS UTILIZED: DePuy Attune size 7 posterior stabilized femoral component (cemented), size 6 rotating platform tibial component (cemented), 38 mm medialized dome patella (cemented), and a 5 mm stabilized rotating platform polyethylene insert.  INDICATIONS FOR SURGERY: Megan Harris is a 67 y.o. year old female with a long history of progressive knee pain. X-rays demonstrated severe degenerative changes in tricompartmental fashion. The patient had not seen any significant improvement despite conservative nonsurgical intervention. After discussion of the risks and benefits of surgical intervention, the patient expressed understanding of the risks benefits and agree with plans for total knee arthroplasty.   The risks, benefits, and alternatives were discussed at length including but not limited to the risks of infection, bleeding, nerve injury, stiffness, blood clots, the need for revision surgery, cardiopulmonary complications, among others, and they were willing to proceed.  PROCEDURE IN DETAIL: The patient was brought into the operating room and, after adequate spinal anesthesia was achieved, a tourniquet was placed on  the patient's upper thigh. The patient's knee and leg were cleaned and prepped with alcohol and DuraPrep and draped in the usual sterile fashion. A "timeout" was performed as per usual protocol. The lower extremity was exsanguinated using an Esmarch, and the tourniquet was inflated to 300 mmHg. An anterior longitudinal incision was made followed by a standard mid vastus approach. The deep fibers of the medial collateral ligament were elevated in a subperiosteal fashion off of the medial flare of the tibia so as to maintain a continuous soft tissue sleeve. The patella was subluxed laterally and the patellofemoral ligament was incised. Inspection of the knee demonstrated severe degenerative changes with full-thickness loss of articular cartilage. Osteophytes were debrided using a rongeur. Anterior and posterior cruciate ligaments were excised. Two 4.0 mm Schanz pins were inserted in the femur and into the tibia for attachment of the array of trackers used for computer-assisted navigation. Hip center was identified using a circumduction technique. Distal landmarks were mapped using the computer. The distal femur and proximal tibia were mapped using the computer. The distal femoral cutting guide was positioned using computer-assisted navigation so as to achieve a 5 distal valgus cut. The femur was sized and it was felt that a size 7 femoral component was appropriate. A size 7 femoral cutting guide was positioned and the anterior cut was performed and verified using the computer. This was followed by completion of the posterior and chamfer cuts. Femoral cutting guide for the central box was then positioned in the center box cut was performed.  Attention was then directed to the proximal tibia. Medial and lateral menisci were excised. The extramedullary tibial cutting guide was positioned using computer-assisted navigation so as to achieve a 0 varus-valgus alignment and 3 posterior slope. The cut was performed and  verified using the computer. The proximal tibia  was sized and it was felt that a size 6 tibial tray was appropriate. Tibial and femoral trials were inserted followed by insertion of a 5 mm polyethylene insert. This allowed for excellent mediolateral soft tissue balancing both in flexion and in full extension. Finally, the patella was cut and prepared so as to accommodate a 38 mm medialized dome patella. A patella trial was placed and the knee was placed through a range of motion with excellent patellar tracking appreciated. The femoral trial was removed after debridement of posterior osteophytes. The central post-hole for the tibial component was reamed followed by insertion of a keel punch. Tibial trials were then removed. Cut surfaces of bone were irrigated with copious amounts of normal saline with antibiotic solution using pulsatile lavage and then suctioned dry. Polymethylmethacrylate cement with gentamicin was prepared in the usual fashion using a vacuum mixer. Cement was applied to the cut surface of the proximal tibia as well as along the undersurface of a size 6 rotating platform tibial component. Tibial component was positioned and impacted into place. Excess cement was removed using [employer] fast streamer. Cement was then applied to the cut surfaces of the femur as well as along the posterior flanges of the size 7 femoral component. The femoral component was positioned and impacted into place. Excess cement was removed using [employer] fast streamer. A 5 mm polyethylene trial was inserted and the knee was brought into full extension with steady axial compression applied. Finally, cement was applied to the backside of a 38 mm medialized dome patella and the patellar component was positioned and patellar clamp applied. Excess cement was removed using [employer] fast streamer. After adequate curing of the cement, the tourniquet was deflated after a total tourniquet time of 80 minutes. Hemostasis was achieved using electrocautery.  The knee was irrigated with copious amounts of normal saline with antibiotic solution using pulsatile lavage and then suctioned dry. 20 mL of 1.3% Exparel and 60 mL of 0.25% Marcaine in 40 mL of normal saline was injected along the posterior capsule, medial and lateral gutters, and along the arthrotomy site. A 5 mm stabilized rotating platform polyethylene insert was inserted and the knee was placed through a range of motion with excellent mediolateral soft tissue balancing appreciated and excellent patellar tracking noted. 2 medium drains were placed in the wound bed and brought out through separate stab incisions. The medial parapatellar portion of the incision was reapproximated using interrupted sutures of #1 Vicryl. Subcutaneous tissue was approximated in layers using first #0 Vicryl followed #2-0 Vicryl. The skin was approximated with skin staples. A sterile dressing was applied.  The patient tolerated the procedure well and was transported to the recovery room in stable condition.    James P. Holley Bouche., M.D.

## 2017-04-20 NOTE — H&P (Signed)
The patient has been re-examined, and the chart reviewed, and there have been no interval changes to the documented history and physical.    The risks, benefits, and alternatives have been discussed at length. The patient expressed understanding of the risks benefits and agreed with plans for surgical intervention.  James P. Hooten, Jr. M.D.    

## 2017-04-20 NOTE — Anesthesia Postprocedure Evaluation (Signed)
Anesthesia Post Note  Patient: Megan Harris  Procedure(s) Performed: COMPUTER ASSISTED TOTAL KNEE ARTHROPLASTY (Right Knee)  Patient location during evaluation: PACU Anesthesia Type: Spinal Level of consciousness: awake and alert Pain management: pain level controlled Vital Signs Assessment: post-procedure vital signs reviewed and stable Respiratory status: spontaneous breathing, nonlabored ventilation, respiratory function stable and patient connected to nasal cannula oxygen Cardiovascular status: blood pressure returned to baseline and stable Postop Assessment: no apparent nausea or vomiting Anesthetic complications: no     Last Vitals:  Vitals:   04/20/17 1821 04/20/17 1822  BP: 123/82 123/82  Pulse: 68 67  Resp: 18 20  Temp: 36.9 C   SpO2: 98% 97%    Last Pain:  Vitals:   04/20/17 1821  TempSrc: Tympanic  PainSc: 0-No pain                 Molli Barrows

## 2017-04-20 NOTE — Anesthesia Post-op Follow-up Note (Signed)
Anesthesia QCDR form completed.        

## 2017-04-20 NOTE — Anesthesia Procedure Notes (Signed)
Spinal  Patient location during procedure: OR Start time: 04/20/2017 3:10 PM End time: 04/20/2017 3:16 PM Staffing Anesthesiologist: Gunnar Fusi, MD Resident/CRNA: Johnna Acosta, CRNA Performed: resident/CRNA  Preanesthetic Checklist Completed: patient identified, site marked, surgical consent, pre-op evaluation, timeout performed, IV checked, risks and benefits discussed and monitors and equipment checked Spinal Block Patient position: sitting Prep: ChloraPrep Patient monitoring: heart rate, continuous pulse ox, blood pressure and cardiac monitor Approach: midline Location: L3-4 Injection technique: single-shot Needle Needle type: Whitacre and Introducer  Needle gauge: 24 G Needle length: 9 cm Assessment Sensory level: T10 Additional Notes Negative paresthesia. Negative blood return. Positive free-flowing CSF. Expiration date of kit checked and confirmed. Patient tolerated procedure well, without complications.

## 2017-04-20 NOTE — Anesthesia Preprocedure Evaluation (Signed)
Anesthesia Evaluation  Patient identified by MRN, date of birth, ID band Patient awake    Reviewed: Allergy & Precautions, NPO status   History of Anesthesia Complications (+) PONV and history of anesthetic complications  Airway Mallampati: II       Dental   Pulmonary asthma , neg sleep apnea, neg COPD,           Cardiovascular hypertension, Pt. on medications (-) Past MI and (-) CHF (-) dysrhythmias (-) Valvular Problems/Murmurs     Neuro/Psych neg Seizures    GI/Hepatic Neg liver ROS, GERD  Medicated and Controlled,  Endo/Other  neg diabetes  Renal/GU negative Renal ROS     Musculoskeletal   Abdominal   Peds  Hematology   Anesthesia Other Findings   Reproductive/Obstetrics                             Anesthesia Physical Anesthesia Plan  ASA: II  Anesthesia Plan: Spinal   Post-op Pain Management:    Induction: Intravenous  PONV Risk Score and Plan:   Airway Management Planned: Nasal Cannula  Additional Equipment:   Intra-op Plan:   Post-operative Plan:   Informed Consent: I have reviewed the patients History and Physical, chart, labs and discussed the procedure including the risks, benefits and alternatives for the proposed anesthesia with the patient or authorized representative who has indicated his/her understanding and acceptance.     Plan Discussed with:   Anesthesia Plan Comments:         Anesthesia Quick Evaluation

## 2017-04-20 NOTE — Transfer of Care (Signed)
Immediate Anesthesia Transfer of Care Note  Patient: Megan Harris  Procedure(s) Performed: COMPUTER ASSISTED TOTAL KNEE ARTHROPLASTY (Right Knee)  Patient Location: PACU  Anesthesia Type:Spinal  Level of Consciousness: awake, alert  and oriented  Airway & Oxygen Therapy: Patient Spontanous Breathing and Patient connected to face mask oxygen  Post-op Assessment: Report given to RN and Post -op Vital signs reviewed and stable  Post vital signs: Reviewed and stable  Last Vitals:  Vitals Value Taken Time  BP 123/82 04/20/2017  6:21 PM  Temp 36.9 C 04/20/2017  6:21 PM  Pulse 67 04/20/2017  6:21 PM  Resp 20 04/20/2017  6:21 PM  SpO2 97 % 04/20/2017  6:21 PM  Vitals shown include unvalidated device data.  Last Pain:  Vitals:   04/20/17 1821  TempSrc: Tympanic         Complications: No apparent anesthesia complications

## 2017-04-21 ENCOUNTER — Encounter: Payer: Self-pay | Admitting: Orthopedic Surgery

## 2017-04-21 MED ORDER — ENOXAPARIN SODIUM 40 MG/0.4ML ~~LOC~~ SOLN: 40.0000 mg | SUBCUTANEOUS | 0 refills | Status: DC

## 2017-04-21 MED ORDER — TRAMADOL HCL 50 MG PO TABS: 50.0000 mg | ORAL_TABLET | ORAL | 0 refills | Status: DC | PRN

## 2017-04-21 MED ORDER — OXYCODONE HCL 5 MG PO TABS: 5.0000 mg | ORAL_TABLET | ORAL | 0 refills | Status: DC | PRN

## 2017-04-21 NOTE — Discharge Summary (Signed)
Physician Discharge Summary  Patient ID: Megan Harris MRN: [id number] DOB/AGE: [birth year] 69 y.o.  Admit date: 04/20/2017 Discharge date: 04/22/2017  Admission Diagnoses:  PRIMARY OSTEOARTHRITIS OF RIGHT KNEE   Discharge Diagnoses: Patient Active Problem List   Diagnosis Date Noted  . Asthma without status asthmaticus 04/20/2017  . Benign essential HTN 04/20/2017  . Chronic insomnia 04/20/2017  . Degenerative disc disease, cervical 04/20/2017  . History of GI diverticular bleed 04/20/2017  . History of peptic ulcer disease 04/20/2017  . Hyperlipidemia, unspecified 04/20/2017  . Vitamin D deficiency, unspecified 04/20/2017  . S/P total knee arthroplasty 04/20/2017  . Primary osteoarthritis of right knee 04/10/2017  . Sciatica of left side 03/17/2017  . Trochanteric bursitis of left hip 03/17/2017  . Status post bilateral salpingo-oophorectomy (BSO) 03/02/2016  . Obesity, Class III, BMI 40-49.9 (morbid obesity) ([place]) 03/02/2016  . Urinary urgency 03/02/2016  . Urinary frequency 03/02/2016  . Vulvar lesion 03/02/2016  . Status post vaginal hysterectomy 03/02/2016  . Surgical menopause 03/02/2016  . Dyspareunia, female 03/02/2016  . Vaginal atrophy 03/02/2016  . Mixed stress and urge urinary incontinence 03/02/2016  . Morbid obesity with BMI of 40.0-44.9, adult ([place]) 06/10/2015  . Nausea alone 06/19/2012  . Encounter for screening colonoscopy for non-high-risk patient 04/27/2012    Past Medical History:  Diagnosis Date  . Abdominal pain, left lower quadrant 2012  . Arthritis   . Asthma   . Back pain 2005  . Breast screening, unspecified 2012  . Chronic cholecystitis 2012  . Constipation 2005  . Endocrine problem 2005  . GERD (gastroesophageal reflux disease) 2012  . Hyperlipidemia 2012  . Hypertension 2002  . Nausea with vomiting 2012  . Obesity, unspecified 2012  . Pneumonia   . PONV (postoperative nausea and vomiting)   . Special screening for malignant  neoplasms, colon 2012     Transfusion: No transfusions during this admission   Consultants (if any):   Discharged Condition: Improved  Hospital Course: Megan Harris is an 67 y.o. female who was admitted 04/20/2017 with a diagnosis of degenerative arthrosis right knee and went to the operating room on 04/20/2017 and underwent the above named procedures.    Surgeries:Procedure(s): COMPUTER ASSISTED TOTAL KNEE ARTHROPLASTY on 04/20/2017  PRE-OPERATIVE DIAGNOSIS: Degenerative arthrosis of the right knee, primary  POST-OPERATIVE DIAGNOSIS:  Same  PROCEDURE:  Right total knee arthroplasty using computer-assisted navigation  SURGEON:  Megan Harris. M.D.  ASSISTANT:  Megan Peper, PA (present and scrubbed throughout the case, critical for assistance with exposure, retraction, instrumentation, and closure)  ANESTHESIA: spinal  ESTIMATED BLOOD LOSS: 50 mL  FLUIDS REPLACED: 1200 mL of crystalloid  TOURNIQUET TIME: 80 minutes  DRAINS: 2 medium Hemovac drains  SOFT TISSUE RELEASES: Anterior cruciate ligament, posterior cruciate ligament, deep medial collateral ligament, patellofemoral ligament  IMPLANTS UTILIZED: DePuy Attune size 7 posterior stabilized femoral component (cemented), size 6 rotating platform tibial component (cemented), 38 mm medialized dome patella (cemented), and a 5 mm stabilized rotating platform polyethylene insert.  INDICATIONS FOR SURGERY: Megan Harris is a 66 y.o. year old female with a long history of progressive knee pain. X-rays demonstrated severe degenerative changes in tricompartmental fashion. The patient had not seen any significant improvement despite conservative nonsurgical intervention. After discussion of the risks and benefits of surgical intervention, the patient expressed understanding of the risks benefits and agree with plans for total knee arthroplasty.   The risks, benefits, and alternatives were discussed at length including but  not limited to  the risks of infection, bleeding, nerve injury, stiffness, blood clots, the need for revision surgery, cardiopulmonary complications, among others, and they were willing to proceed.    Patient tolerated the surgery well. No complications .Patient was taken to PACU where she was stabilized and then transferred to the orthopedic floor.  Patient started on Lovenox 30 mg q 12 hrs. Foot pumps applied bilaterally at 80 mm hgb. Heels elevated off bed with rolled towels. No evidence of DVT. Calves non tender. Negative Homan. Physical therapy started on day #1 for gait training and transfer with OT starting on  day #1 for ADL and assisted devices. Patient has done well with therapy. Ambulated greater than 200 feet upon being discharged.  Was able to ascend and descend 4 steps safely and independently  Patient's IV And Foley were discontinued on day #1 with Hemovac being discontinued on day #2. Dressing was changed on day 2 prior to patient being discharged   She was given perioperative antibiotics:  Anti-infectives (From admission, onward)   Start     Dose/Rate Route Frequency Ordered Stop   04/20/17 2000  ceFAZolin (ANCEF) IVPB 2g/100 mL premix     2 g 200 mL/hr over 30 Minutes Intravenous Every 6 hours 04/20/17 1932 04/21/17 1959   04/20/17 1352  clindamycin (CLEOCIN) 900 MG/50ML IVPB    Note to Pharmacy:  Megan Harris   : cabinet override      04/20/17 1352 04/20/17 1528   04/20/17 0600  clindamycin (CLEOCIN) IVPB 900 mg  Status:  Discontinued     900 mg 100 mL/hr over 30 Minutes Intravenous On call to O.R. 04/19/17 2147 04/20/17 1400    .  She was fitted with AV 1 compression foot pump devices, instructed on heel pumps, early ambulation, and fitted with TED stockings bilaterally for DVT prophylaxis.  She benefited maximally from the hospital stay and there were no complications.    Recent vital signs:  Vitals:   04/20/17 2237 04/21/17 0027  BP: 124/71 109/67  Pulse:  73 64  Resp: 16 17  Temp: 98 F (36.7 C) 98.1 F (36.7 C)  SpO2: 94% 92%    Recent laboratory studies:  Lab Results  Component Value Date   HGB 14.4 04/13/2017   HGB 14.2 12/05/2016   Lab Results  Component Value Date   WBC 5.4 04/13/2017   PLT 199 04/13/2017   Lab Results  Component Value Date   INR 1.00 04/13/2017   Lab Results  Component Value Date   NA 138 04/13/2017   K 3.3 (L) 04/13/2017   CL 101 04/13/2017   CO2 25 04/13/2017   BUN 16 04/13/2017   CREATININE 0.80 04/13/2017   GLUCOSE 123 (H) 04/13/2017    Discharge Medications:   Allergies as of 04/21/2017      Reactions   Codeine Other (See Comments)   Breathing problems   Penicillins Hives   Tape Dermatitis   Skin irritation      Medication List    TAKE these medications   amLODipine 5 MG tablet Commonly known as:  NORVASC Take 5 mg by mouth daily.   atorvastatin 10 MG tablet Commonly known as:  LIPITOR Take 10 mg by mouth daily.   CALCIUM CITRATE + D PO Take 1 tablet by mouth daily.   cetirizine 10 MG tablet Commonly known as:  ZYRTEC Take 10 mg by mouth daily.   dextromethorphan-guaiFENesin 30-600 MG 12hr tablet Commonly known as:  MUCINEX DM Take 1 tablet by mouth  daily as needed for cough.   enoxaparin 40 MG/0.4ML injection Commonly known as:  LOVENOX Inject 0.4 mLs (40 mg total) into the skin daily for 14 days. Start taking on:  04/23/2017   isosorbide mononitrate 30 MG 24 hr tablet Commonly known as:  IMDUR Take 30 mg by mouth daily.   lansoprazole 30 MG capsule Commonly known as:  PREVACID Take 30 mg by mouth 2 (two) times daily.   losartan-hydrochlorothiazide 100-25 MG tablet Commonly known as:  HYZAAR Take 1 tablet daily by mouth.   montelukast 10 MG tablet Commonly known as:  SINGULAIR Take 10 mg by mouth at bedtime.   oxyCODONE 5 MG immediate release tablet Commonly known as:  Oxy IR/ROXICODONE Take 1 tablet (5 mg total) by mouth every 4 (four) hours as needed  for moderate pain (pain score 4-6).   traMADol 50 MG tablet Commonly known as:  ULTRAM Take 1-2 tablets (50-100 mg total) by mouth every 4 (four) hours as needed for moderate pain.   VITAMIN D3 MAXIMUM STRENGTH 5000 units capsule Generic drug:  Cholecalciferol Take 5,000 Units by mouth daily.   zolpidem 10 MG tablet Commonly known as:  AMBIEN Take 10 mg by mouth at bedtime as needed for sleep.            Durable Medical Equipment  (From admission, onward)        Start     Ordered   04/20/17 1933  DME Walker rolling  Once    Question:  Patient needs a walker to treat with the following condition  Answer:  Total knee replacement status   04/20/17 1932   04/20/17 1933  DME Bedside commode  Once    Question:  Patient needs a bedside commode to treat with the following condition  Answer:  Total knee replacement status   04/20/17 1932      Diagnostic Studies: Dg Knee Right Port  Result Date: 04/20/2017 CLINICAL DATA:  Postop total knee replacement. EXAM: PORTABLE RIGHT KNEE - 1-2 VIEW COMPARISON:  Right knee MRI 10/29/2014 FINDINGS: Sequelae of total knee arthroplasty are identified. The prosthetic components appear normally located. No fracture or dislocation is identified. Surgical drains and skin staples are in place. Postoperative gas is noted in the surrounding soft tissues. IMPRESSION: Right total knee arthroplasty without evidence of acute complication. Electronically Signed   By: Logan Bores M.D.   On: 04/20/2017 18:43    Disposition:   Discharge Instructions    Diet - low sodium heart healthy   Complete by:  As directed    Increase activity slowly   Complete by:  As directed       Follow-up Information    Watt Climes, PA On 05/03/2017.   Specialty:  Physician Assistant Why:  at 2:15pm Contact information: [place] [address] [phone]        Dereck Leep, MD On 06/02/2017.   Specialty:  Orthopedic  Surgery Why:  at 9:45am Contact information: [place] [address] [phone]            Signed: Watt Climes. 04/21/2017, 8:01 AM

## 2017-04-21 NOTE — Progress Notes (Signed)
Clinical Social Worker (CSW) received SNF consult. PT is recommending home health. RN case manager aware of above. Please reconsult if future social work needs arise. CSW signing off.   Awais Cobarrubias, LCSW [phone] 

## 2017-04-21 NOTE — Progress Notes (Signed)
Pt alert and oriented. Ambulated in room. No complaint of dizziness or nausea while up. Dressing to right knee dry and intact. Pt tolerated bone foam with polar care and hemovac in place. Encouraged to use incentive spirometer. No acute distress noted. Will continue to monitor pt

## 2017-04-21 NOTE — NC FL2 (Signed)
[place] LEVEL OF CARE SCREENING TOOL     IDENTIFICATION  Patient Name: Megan Harris Birthdate: Apr 13, 1950 Sex: female Admission Date (Current Location): 04/20/2017  [place] and [place] Number:  Engineering geologist and Address:  [facility], [address], [place], [place] [address]      Provider Number: [id number]  Attending Physician Name and Address:  Dereck Leep, MD  Relative Name and Phone Number:       Current Level of Care: Hospital Recommended Level of Care: [place] Prior Approval Number:    Date Approved/Denied:   PASRR Number: [id number])  Discharge Plan: SNF    Current Diagnoses: Patient Active Problem List   Diagnosis Date Noted  . Asthma without status asthmaticus 04/20/2017  . Benign essential HTN 04/20/2017  . Chronic insomnia 04/20/2017  . Degenerative disc disease, cervical 04/20/2017  . History of GI diverticular bleed 04/20/2017  . History of peptic ulcer disease 04/20/2017  . Hyperlipidemia, unspecified 04/20/2017  . Vitamin D deficiency, unspecified 04/20/2017  . S/P total knee arthroplasty 04/20/2017  . Primary osteoarthritis of right knee 04/10/2017  . Sciatica of left side 03/17/2017  . Trochanteric bursitis of left hip 03/17/2017  . Status post bilateral salpingo-oophorectomy (BSO) 03/02/2016  . Obesity, Class III, BMI 40-49.9 (morbid obesity) ([place]) 03/02/2016  . Urinary urgency 03/02/2016  . Urinary frequency 03/02/2016  . Vulvar lesion 03/02/2016  . Status post vaginal hysterectomy 03/02/2016  . Surgical menopause 03/02/2016  . Dyspareunia, female 03/02/2016  . Vaginal atrophy 03/02/2016  . Mixed stress and urge urinary incontinence 03/02/2016  . Morbid obesity with BMI of 40.0-44.9, adult ([place]) 06/10/2015  . Nausea alone 06/19/2012  . Encounter for screening colonoscopy for non-high-risk patient 04/27/2012    Orientation RESPIRATION BLADDER Height & Weight      Self, Time, Situation, Place  Normal Continent Weight: 242 lb (109.8 kg) Height:  5\' 5"  (165.1 cm)  BEHAVIORAL SYMPTOMS/MOOD NEUROLOGICAL BOWEL NUTRITION STATUS      Continent Diet(Regular)  AMBULATORY STATUS COMMUNICATION OF NEEDS Skin   Extensive Assist Verbally Surgical wounds(Incision Right Knee)                       Personal Care Assistance Level of Assistance  Bathing, Feeding, Dressing Bathing Assistance: Limited assistance Feeding assistance: Independent Dressing Assistance: Limited assistance     Functional Limitations Info  Sight, Hearing, Speech Sight Info: Adequate Hearing Info: Adequate Speech Info: Adequate    SPECIAL CARE FACTORS FREQUENCY  PT (By licensed PT), OT (By licensed OT)     PT Frequency: (5) OT Frequency: (5)            Contractures      Additional Factors Info  Code Status, Allergies Code Status Info: (Full Code) Allergies Info: (CODEINE, PENICILLINS, TAPE )           Current Medications (04/21/2017):  This is the current hospital active medication list Current Facility-Administered Medications  Medication Dose Route Frequency Provider Last Rate Last Dose  . 0.9 %  sodium chloride infusion   Intravenous Continuous Dereck Leep, MD 100 mL/hr at 04/20/17 2046    . acetaminophen (OFIRMEV) IV 1,000 mg  1,000 mg Intravenous Q6H Hooten, Laurice Record, MD 400 mL/hr at 04/21/17 0540 1,000 mg at 04/21/17 0540  . acetaminophen (TYLENOL) tablet 325-650 mg  325-650 mg Oral Q6H PRN Hooten, Laurice Record, MD      . alum & mag hydroxide-simeth (MAALOX/MYLANTA) 200-200-20  MG/5ML suspension 30 mL  30 mL Oral Q4H PRN Hooten, Laurice Record, MD      . amLODipine (NORVASC) tablet 5 mg  5 mg Oral Daily Hooten, Laurice Record, MD      . atorvastatin (LIPITOR) tablet 10 mg  10 mg Oral QPM Hooten, Laurice Record, MD      . bisacodyl (DULCOLAX) suppository 10 mg  10 mg Rectal Daily PRN Hooten, Laurice Record, MD      . calcium citrate-vitamin D 500-500 MG-UNIT per chewable tablet   Oral  Daily Hooten, Laurice Record, MD      . ceFAZolin (ANCEF) IVPB 2g/100 mL premix  2 g Intravenous Q6H Hooten, Laurice Record, MD   Stopped at 04/21/17 [phone]  . celecoxib (CELEBREX) capsule 200 mg  200 mg Oral BID Dereck Leep, MD   200 mg at 04/20/17 2204  . cholecalciferol (VITAMIN D) tablet 5,000 Units  5,000 Units Oral Daily Hooten, Laurice Record, MD      . diphenhydrAMINE (BENADRYL) 12.5 MG/5ML elixir 12.5-25 mg  12.5-25 mg Oral Q4H PRN Hooten, Laurice Record, MD      . enoxaparin (LOVENOX) injection 30 mg  30 mg Subcutaneous Q12H Hooten, Laurice Record, MD   30 mg at 04/21/17 0753  . ferrous sulfate tablet 325 mg  325 mg Oral BID WC Hooten, Laurice Record, MD   325 mg at 04/21/17 0749  . gabapentin (NEURONTIN) capsule 300 mg  300 mg Oral QHS Hooten, Laurice Record, MD   300 mg at 04/20/17 2204  . losartan (COZAAR) tablet 100 mg  100 mg Oral Daily Hallaji, Sheema M, RPH       And  . hydrochlorothiazide (HYDRODIURIL) tablet 25 mg  25 mg Oral Daily Hallaji, Sheema M, RPH      . HYDROmorphone (DILAUDID) injection 0.5-1 mg  0.5-1 mg Intravenous Q4H PRN Hooten, Laurice Record, MD      . isosorbide mononitrate (IMDUR) 24 hr tablet 30 mg  30 mg Oral Daily Hooten, Laurice Record, MD      . loratadine (CLARITIN) tablet 10 mg  10 mg Oral Daily Hooten, Laurice Record, MD      . magnesium hydroxide (MILK OF MAGNESIA) suspension 30 mL  30 mL Oral Daily PRN Hooten, Laurice Record, MD      . menthol-cetylpyridinium (CEPACOL) lozenge 3 mg  1 lozenge Oral PRN Hooten, Laurice Record, MD       Or  . phenol (CHLORASEPTIC) mouth spray 1 spray  1 spray Mouth/Throat PRN Hooten, Laurice Record, MD      . metoCLOPramide (REGLAN) tablet 5-10 mg  5-10 mg Oral Q8H PRN Hooten, Laurice Record, MD       Or  . metoCLOPramide (REGLAN) injection 5-10 mg  5-10 mg Intravenous Q8H PRN Hooten, Laurice Record, MD      . metoCLOPramide (REGLAN) tablet 10 mg  10 mg Oral TID AC & HS Hooten, Laurice Record, MD   10 mg at 04/21/17 0749  . montelukast (SINGULAIR) tablet 10 mg  10 mg Oral QHS Hooten, Laurice Record, MD   10 mg at 04/20/17 2204  .  ondansetron (ZOFRAN) tablet 4 mg  4 mg Oral Q6H PRN Hooten, Laurice Record, MD       Or  . ondansetron (ZOFRAN) injection 4 mg  4 mg Intravenous Q6H PRN Hooten, Laurice Record, MD      . oxyCODONE (Oxy IR/ROXICODONE) immediate release tablet 10 mg  10 mg Oral Q4H PRN Hooten, Laurice Record, MD   10  mg at 04/21/17 0749  . oxyCODONE (Oxy IR/ROXICODONE) immediate release tablet 5 mg  5 mg Oral Q4H PRN Hooten, Laurice Record, MD   5 mg at 04/20/17 1951  . pantoprazole (PROTONIX) EC tablet 40 mg  40 mg Oral BID Dereck Leep, MD   40 mg at 04/20/17 2204  . scopolamine (TRANSDERM-SCOP) 1 MG/3DAYS 1.5 mg  1 patch Transdermal Once Gunnar Fusi, MD   1.5 mg at 04/20/17 1429  . senna-docusate (Senokot-S) tablet 1 tablet  1 tablet Oral BID Dereck Leep, MD   1 tablet at 04/20/17 2204  . sodium phosphate (FLEET) 7-19 GM/118ML enema 1 enema  1 enema Rectal Once PRN Hooten, Laurice Record, MD      . traMADol Veatrice Bourbon) tablet 50-100 mg  50-100 mg Oral Q4H PRN Hooten, Laurice Record, MD   100 mg at 04/20/17 2204  . zolpidem (AMBIEN) tablet 5 mg  5 mg Oral QHS PRN Dereck Leep, MD   5 mg at 04/20/17 2330     Discharge Medications: Please see discharge summary for a list of discharge medications.  Relevant Imaging Results:  Relevant Lab Results:   Additional Information (SSN: [id number])  Suchan Mince, Student-Social Work

## 2017-04-21 NOTE — Progress Notes (Signed)
   04/21/17 0425  Clinical Encounter Type  Visited With Other (Comment)  Visit Type Initial  Referral From Nurse  Consult/Referral To Chaplain  Spiritual Encounters  Spiritual Needs Other (Comment)   [place] received an OR for AD education with PT. Upon investigation, PT did not request an AD. OR was closed out.

## 2017-04-21 NOTE — Progress Notes (Signed)
Physical Therapy Treatment Patient Details Name: Megan Harris MRN: [id number] DOB: [birth year] Today's Date: 04/21/2017    History of Present Illness Pt is a 67 year old female admitted s/p TKA.  PMH includes pneumonia, Htn, HLD, GERD, back pain adn abdominal pain.    PT Comments    Pt was able to complete step training and ascend/descend 8 steps with a step to gait pattern and using bilateral handrails.  PT wheeled pt to stairway in PT gym in recliner with assistance of pt's friend to manage IV pole.  Pt reported no pain increase following.  She was able to complete seated there ex prior to ambulation for improved ROM with mobility.  Pt ambulated in room to bathroom and performed STS from toilet with CGA.  PT discussed frequency of performance of HEP and pt expressed interest.  Pt will continue to benefit from skilled Pt with focus on there ex for strength, knee flexion ROM, functional mobility, balance and safe use of AD.   Follow Up Recommendations  Home health PT     Equipment Recommendations  Rolling walker with 5" wheels    Recommendations for Other Services       Precautions / Restrictions Precautions Precautions: Knee Restrictions Weight Bearing Restrictions: Yes RLE Weight Bearing: Weight bearing as tolerated    Mobility  Bed Mobility Overal bed mobility: (Did not perform)                Transfers Overall transfer level: Needs assistance Equipment used: Rolling walker (2 wheeled) Transfers: Sit to/from Stand Sit to Stand: Min guard         General transfer comment: able to stand from chair with close CGA.  No VC's for hand placement.  Ambulation/Gait Ambulation/Gait assistance: Supervision Ambulation Distance (Feet): 50 Feet Assistive device: Rolling walker (2 wheeled)     Gait velocity interpretation: Below normal speed for age/gender General Gait Details: Walked 50 ft in room with RW, good foot clearance, knee flexion during swing phase and  slightly increased gait speed compared to this morning.   Stairs Stairs: Yes   Stair Management: Two rails;Step to pattern;Forwards Number of Stairs: 8 General stair comments: Pt able to perform step to gait pattern close CGA with VC's for leading LE and safe placement of foot on step.  Wheelchair Mobility    Modified Rankin (Stroke Patients Only)       Balance Overall balance assessment: Modified Independent                                          Cognition Arousal/Alertness: Awake/alert Behavior During Therapy: WFL for tasks assessed/performed Overall Cognitive Status: Within Functional Limits for tasks assessed                                        Exercises Total Joint Exercises Ankle Circles/Pumps: AROM;10 reps;Both;Seated Long Arc Quad: AROM;Right;10 reps;Seated Knee Flexion: AAROM;5 reps;Right;Seated Goniometric ROM: Knee ext/flex ROM: L: 0-115, R: 0-85 deg Other Exercises Other Exercises: Seated march x10 bilaterally with PT VC's for body mechanics and upright posture.    General Comments        Pertinent Vitals/Pain Pain Assessment: 0-10 Pain Score: 7  Pain Descriptors / Indicators: Aching Pain Intervention(s): Limited activity within patient's tolerance;Monitored during session    Home  Living                      Prior Function            PT Goals (current goals can now be found in the care plan section) Acute Rehab PT Goals Patient Stated Goal: To return home and work with therapy so she can return to her generally active lifestyle. PT Goal Formulation: With patient Time For Goal Achievement: 05/05/17 Potential to Achieve Goals: Good Additional Goals Additional Goal #1: Pt will perform 0-100 degrees of knee flexion prior to DC. Progress towards PT goals: Progressing toward goals    Frequency    BID      PT Plan Current plan remains appropriate    Co-evaluation              AM-PAC  PT "6 Clicks" Daily Activity  Outcome Measure  Difficulty turning over in bed (including adjusting bedclothes, sheets and blankets)?: None Difficulty moving from lying on back to sitting on the side of the bed? : A Little Difficulty sitting down on and standing up from a chair with arms (e.g., wheelchair, bedside commode, etc,.)?: A Little Help needed moving to and from a bed to chair (including a wheelchair)?: A Little Help needed walking in hospital room?: A Little Help needed climbing 3-5 steps with a railing? : A Little 6 Click Score: 19    End of Session Equipment Utilized During Treatment: Gait belt Activity Tolerance: Patient tolerated treatment well Patient left: in chair;with chair alarm set;with call bell/phone within reach;with family/visitor present   PT Visit Diagnosis: Unsteadiness on feet (R26.81);Pain;Muscle weakness (generalized) (M62.81) Pain - Right/Left: Right Pain - part of body: Knee     Time: 1450-1515 PT Time Calculation (min) (ACUTE ONLY): 25 min  Charges:  $Gait Training: 8-22 mins $Therapeutic Exercise: 8-22 mins                    G Codes:  Functional Assessment Tool Used: AM-PAC 6 Clicks Basic Mobility    Roxanne Gates, PT, DPT    Roxanne Gates 04/21/2017, 3:26 PM

## 2017-04-21 NOTE — Care Management Note (Signed)
Case Management Note  Patient Details  Name: Megan Harris MRN: [id number] Date of Birth: 07/27/1950  Subjective/Objective:  POD # 1 right total knee arthroplasty. Met with patient, her sister and a friend at bedside to discuss discharge planning. She lives with her spouse. Will need a walker, ordered from [facility] with [facility]. Offered choice of home health agencies. [facility] is unable to accept patients insurance. Referral to [facility] with [facility] for home health PT.  Requested visit within 24 hours. Pharmacy- [facility]- patient has already had spouse picking up medication.                    Action/Plan: [facility] for HHPT and walker.   Expected Discharge Date:                  Expected Discharge Plan:  Home w Home Health Services  In-House Referral:     Discharge planning Services  CM Consult  Post Acute Care Choice:  Durable Medical Equipment, Home Health Choice offered to:  Patient  DME Arranged:  Walker rolling DME Agency:  [facility]  HH Arranged:  PT HH Agency:  [facility]  Status of Service:  In process, will continue to follow  If discussed at Long Length of Stay Meetings, dates discussed:    Additional Comments:  Lisa M Jacobs, RN 04/21/2017, 2:41 PM  

## 2017-04-21 NOTE — Progress Notes (Signed)
   Subjective: 1 Day Post-Op Procedure(s) (LRB): COMPUTER ASSISTED TOTAL KNEE ARTHROPLASTY (Right) Patient reports pain as 6 on 0-10 scale.   Patient is well, and has had no acute complaints or problems We will start therapy today.  Plan is to go Home after hospital stay. no nausea and no vomiting Patient denies any chest pains or shortness of breath. Objective: Vital signs in last 24 hours: Temp:  [97.7 F (36.5 C)-98.4 F (36.9 C)] 98.1 F (36.7 C) (04/04 0027) Pulse Rate:  [64-78] 64 (04/04 0027) Resp:  [12-24] 17 (04/04 0027) BP: (109-171)/(67-99) 109/67 (04/04 0027) SpO2:  [92 %-98 %] 92 % (04/04 0027) Weight:  [109.8 kg (242 lb)] 109.8 kg (242 lb) (04/03 1405) Heels are non tender and elevated off the bed using rolled towels as well as bone foam under operative heel  Intake/Output from previous day: 04/03 0701 - 04/04 0700 In: 2003.3 [P.O.:480; I.V.:1323.3; IV Piggyback:200] Out: 1535 [Urine:1225; Drains:260; Blood:50] Intake/Output this shift: No intake/output data recorded.  No results for input(s): HGB in the last 72 hours. No results for input(s): WBC, RBC, HCT, PLT in the last 72 hours. No results for input(s): NA, K, CL, CO2, BUN, CREATININE, GLUCOSE, CALCIUM in the last 72 hours. No results for input(s): LABPT, INR in the last 72 hours.  EXAM General - Patient is Alert, Appropriate and Oriented Extremity - Neurologically intact Neurovascular intact Sensation intact distally Intact pulses distally Dorsiflexion/Plantar flexion intact Compartment soft Dressing - dressing C/D/I Motor Function - intact, moving foot and toes well on exam.    Past Medical History:  Diagnosis Date  . Abdominal pain, left lower quadrant 2012  . Arthritis   . Asthma   . Back pain 2005  . Breast screening, unspecified 2012  . Chronic cholecystitis 2012  . Constipation 2005  . Endocrine problem 2005  . GERD (gastroesophageal reflux disease) 2012  . Hyperlipidemia 2012  .  Hypertension 2002  . Nausea with vomiting 2012  . Obesity, unspecified 2012  . Pneumonia   . PONV (postoperative nausea and vomiting)   . Special screening for malignant neoplasms, colon 2012    Assessment/Plan: 1 Day Post-Op Procedure(s) (LRB): COMPUTER ASSISTED TOTAL KNEE ARTHROPLASTY (Right) Active Problems:   S/P total knee arthroplasty  Estimated body mass index is 40.27 kg/m as calculated from the following:   Height as of this encounter: 5\' 5"  (1.651 m).   Weight as of this encounter: 109.8 kg (242 lb). Advance diet Up with therapy D/C IV fluids Plan for discharge tomorrow Discharge home with home health  Labs: None DVT Prophylaxis - Lovenox, Foot Pumps and TED hose Weight-Bearing as tolerated to right leg D/C O2 and Pulse OX and try on Room Air Begin working on bowel movement  Meshach Perry R. [place] 04/21/2017, 7:38 AM

## 2017-04-21 NOTE — Evaluation (Signed)
Physical Therapy Evaluation Patient Details Name: Megan Harris MRN: [id number] DOB: 19-May-1950 Today's Date: 04/21/2017   History of Present Illness  Pt is a 67 year old female admitted s/p TKA.  PMH includes pneumonia, Htn, HLD, GERD, back pain adn abdominal pain.  Clinical Impression  Pt is a 67 year old female who lives in a one story home with her husband.  Pt is in the bathroom in her room upon PT arrival.  Pt is able to perform STS from toilet and bed with close CGA and infrequent reminders for hand placement on RW.  Pt ambulated 60 ft in room with RW and PT provided gait training for proper mechanics to maintain WB status.  PT introduced therapeutic exercises to pt in sitting position and advised pt concerning frequency.  Pt reported pain level of 7/10 but described pain as more of an ache.  Pt was able to perform 0-85 degrees of R knee extension/flexion following AAROM in the seated position.  Pt will continue to benefit from skilled PT with focus on knee flexion ROM, there ex for strength, safe functional mobility with RW, stair training and tolerance to activity.    Follow Up Recommendations Home health PT    Equipment Recommendations  Rolling walker with 5" wheels    Recommendations for Other Services       Precautions / Restrictions Precautions Precautions: Knee Precaution Comments: R knee      Mobility  Bed Mobility Overal bed mobility: Modified Independent                Transfers Overall transfer level: Needs assistance Equipment used: Rolling walker (2 wheeled) Transfers: Sit to/from Stand Sit to Stand: Min guard         General transfer comment: Able to stand without physical assist.  Requires occasional reminder of hand placement when using RW.  Ambulation/Gait   Ambulation Distance (Feet): 60 Feet Assistive device: Rolling walker (2 wheeled)     Gait velocity interpretation: Below normal speed for age/gender General Gait Details: Pt able to  perform step through gait pattern with low-mod foot clearance in swing phase.  Pt has a lot of questions concerning appropriate gait pattern and PT educated pt, advising her to follow her natural gait pattern if possible and to actively perform increased knee flexion if needed.  Stairs            Wheelchair Mobility    Modified Rankin (Stroke Patients Only)       Balance Overall balance assessment: Modified Independent                                           Pertinent Vitals/Pain Pain Assessment: 0-10 Pain Score: 7  Pain Location: R knee Pain Descriptors / Indicators: Discomfort Pain Intervention(s): Limited activity within patient's tolerance;Monitored during session    Home Living Family/patient expects to be discharged to:: Private residence Living Arrangements: Spouse/significant other Available Help at Discharge: Family;Available 24 hours/day Type of Home: House Home Access: Stairs to enter Entrance Stairs-Rails: Can reach both Entrance Stairs-Number of Steps: 7 in back with a 25 ft walk to get to the deck. Home Layout: One level Home Equipment: Grab bars - tub/shower;Shower seat;Cane - single point Additional Comments: Can also hold onto sink when standing from toilet    Prior Function Level of Independence: Independent  Comments: Independent with all ADL's prior     Hand Dominance   Dominant Hand: Right    Extremity/Trunk Assessment   Upper Extremity Assessment Upper Extremity Assessment: Overall WFL for tasks assessed    Lower Extremity Assessment Lower Extremity Assessment: Overall WFL for tasks assessed;RLE deficits/detail RLE: Unable to fully assess due to pain;Unable to fully assess due to immobilization RLE Sensation: WNL RLE Coordination: WNL    Cervical / Trunk Assessment Cervical / Trunk Assessment: Normal  Communication   Communication: No difficulties  Cognition Arousal/Alertness: Awake/alert Behavior  During Therapy: WFL for tasks assessed/performed Overall Cognitive Status: Within Functional Limits for tasks assessed                                        General Comments      Exercises Total Joint Exercises Ankle Circles/Pumps: AROM;10 reps;Both;Seated Knee Flexion: AAROM;5 reps;Seated Goniometric ROM: Knee ext/flex ROM: L: 0-115, R: 0-85 deg Other Exercises Other Exercises: Seated march x10 bilaterally with PT VC's for body mechanics and upright posture.   Assessment/Plan    PT Assessment Patient needs continued PT services  PT Problem List Decreased strength;Decreased mobility;Decreased range of motion;Decreased coordination;Decreased activity tolerance;Decreased cognition;Decreased balance;Decreased knowledge of use of DME;Decreased knowledge of precautions;Pain       PT Treatment Interventions DME instruction;Therapeutic activities;Gait training;Therapeutic exercise;Stair training;Balance training;Functional mobility training;Neuromuscular re-education;Patient/family education    PT Goals (Current goals can be found in the Care Plan section)  Acute Rehab PT Goals Patient Stated Goal: To return home and work with therapy so she can return to her generally active lifestyle. PT Goal Formulation: With patient Time For Goal Achievement: 05/05/17 Potential to Achieve Goals: Good    Frequency BID   Barriers to discharge        Co-evaluation               AM-PAC PT "6 Clicks" Daily Activity  Outcome Measure Difficulty turning over in bed (including adjusting bedclothes, sheets and blankets)?: None Difficulty moving from lying on back to sitting on the side of the bed? : A Little Difficulty sitting down on and standing up from a chair with arms (e.g., wheelchair, bedside commode, etc,.)?: A Little Help needed moving to and from a bed to chair (including a wheelchair)?: A Little Help needed walking in hospital room?: A Little Help needed climbing  3-5 steps with a railing? : A Little 6 Click Score: 19    End of Session Equipment Utilized During Treatment: Gait belt Activity Tolerance: Patient tolerated treatment well Patient left: in chair;with call bell/phone within reach;with chair alarm set   PT Visit Diagnosis: Unsteadiness on feet (R26.81);Muscle weakness (generalized) (M62.81);Pain Pain - Right/Left: Right Pain - part of body: Knee    Time: 0925-0950 PT Time Calculation (min) (ACUTE ONLY): 25 min   Charges:   PT Evaluation $PT Eval Low Complexity: 1 Low PT Treatments $Gait Training: 8-22 mins   PT G Codes:   PT G-Codes **NOT FOR INPATIENT CLASS** Functional Assessment Tool Used: AM-PAC 6 Clicks Basic Mobility    Roxanne Gates, PT, DPT   Roxanne Gates 04/21/2017, 11:58 AM

## 2017-04-22 MED ORDER — LACTULOSE 10 GM/15ML PO SOLN
10.0000 g | Freq: Two times a day (BID) | ORAL | Status: DC | PRN
  Administered 2017-04-22: 10 g via ORAL
  Filled 2017-04-22: qty 30

## 2017-04-22 NOTE — Care Management Note (Signed)
Case Management Note  Patient Details  Name: ROSABELLE JUPIN MRN: [id number] Date of Birth: [birth year]  Subjective/Objective:  Discharging today                  Action/Plan: [facility] notified of discharge.  Expected Discharge Date:  04/22/17               Expected Discharge Plan:  [place]  In-House Referral:     Discharge planning Services  CM Consult  Post Acute Care Choice:  Durable Medical Equipment, Home Health Choice offered to:  Patient  DME Arranged:  Walker rolling DME Agency:  [place] Arranged:  PT [facility] Agency:  [facility]  Status of Service:  Completed, signed off  If discussed at [place] of Stay Meetings, dates discussed:    Additional Comments:  Jolly Mango, RN 04/22/2017, 8:54 AM

## 2017-04-22 NOTE — Care Management Important Message (Signed)
Important Message  Patient Details  Name: Megan Harris MRN: [id number] Date of Birth: 03/14/1950   Medicare Important Message Given:  Yes    Juliann Pulse A Burl Tauzin 04/22/2017, 9:57 AM

## 2017-04-22 NOTE — Progress Notes (Signed)
Discharge instructions and medication details reviewed with patient. All questions answered. Printed prescriptions and AVS given to patient. Dressing changed and 2 extra dressings given. IV removed. Patient escorted out via wheelchair and helped into the car.

## 2017-04-22 NOTE — Progress Notes (Signed)
Physical Therapy Treatment Patient Details Name: Megan Harris MRN: [id number] DOB: 1950/10/04 Today's Date: 04/22/2017    History of Present Illness Pt is a 67 year old female admitted s/p TKA.  PMH includes pneumonia, Htn, HLD, GERD, back pain adn abdominal pain.    PT Comments    Pt able to progress to ambulation with RW with supervision and PT tech following behind with rolling chair.  Pt did not require a rest break and demonstrated better gait symmetry as ambulation progressed.  PT confirmed that pt is comfortable to with HEP exercises and pt demonstrated ability to perform there ex in question.  She is now able to perform STS from chair with supervision and demonstrates safe body mechanics when using RW.    Pt was able to achieve 0-110 degrees of R knee ext/flex in sitting and reported pain of 7/10 following treatment.  RN in room providing pain medication following treatment.  Pt will continue to benefit from skilled PT with focus on there ex for strength, knee flexion ROM, functional mobility and pain management.   Follow Up Recommendations  Home health PT     Equipment Recommendations       Recommendations for Other Services       Precautions / Restrictions Precautions Precautions: Knee Restrictions Weight Bearing Restrictions: Yes RLE Weight Bearing: Weight bearing as tolerated    Mobility  Bed Mobility Overal bed mobility: (Did not perform)                Transfers Overall transfer level: Needs assistance Equipment used: Rolling walker (2 wheeled) Transfers: Sit to/from Stand Sit to Stand: Min guard;Supervision         General transfer comment: able to stand from chair with supervision.  No VC's for hand placement.  Ambulation/Gait Ambulation/Gait assistance: Supervision Ambulation Distance (Feet): 350 Feet Assistive device: Rolling walker (2 wheeled)     Gait velocity interpretation: at or above normal speed for age/gender General Gait Details:  Pt demonstrates swing through gait pattern with good foot clearance and proper use of RW following min VC's.  Pt requested to do a second lap around the nurses station and demonstrated greater gait symmetry with increased distance.   Stairs         General stair comments: Pt able to perform step to gait pattern close CGA with VC's for leading LE and safe placement of foot on step.  Wheelchair Mobility    Modified Rankin (Stroke Patients Only)       Balance Overall balance assessment: Modified Independent                                          Cognition                                              Exercises Total Joint Exercises Long Arc Quad: Right;10 reps;Seated;AROM;Strengthening Knee Flexion: AAROM;Right;5 reps;Seated Goniometric ROM: R knee ext/flex: 0-110 deg    General Comments        Pertinent Vitals/Pain      Home Living                      Prior Function            PT  Goals (current goals can now be found in the care plan section) Acute Rehab PT Goals Patient Stated Goal: To return home and work with therapy so she can return to her generally active lifestyle. PT Goal Formulation: With patient Time For Goal Achievement: 05/05/17 Potential to Achieve Goals: Good Progress towards PT goals: Progressing toward goals    Frequency    BID      PT Plan Current plan remains appropriate    Co-evaluation              AM-PAC PT "6 Clicks" Daily Activity  Outcome Measure  Difficulty turning over in bed (including adjusting bedclothes, sheets and blankets)?: A Little Difficulty moving from lying on back to sitting on the side of the bed? : A Little Difficulty sitting down on and standing up from a chair with arms (e.g., wheelchair, bedside commode, etc,.)?: A Little Help needed moving to and from a bed to chair (including a wheelchair)?: A Little Help needed walking in hospital room?: A  Little Help needed climbing 3-5 steps with a railing? : A Little 6 Click Score: 18    End of Session Equipment Utilized During Treatment: Gait belt Activity Tolerance: Patient tolerated treatment well Patient left: in chair;with call bell/phone within reach;with chair alarm set Nurse Communication: Mobility status PT Visit Diagnosis: Muscle weakness (generalized) (M62.81);Pain Pain - Right/Left: Right Pain - part of body: Knee     Time: 0830-0900 PT Time Calculation (min) (ACUTE ONLY): 30 min  Charges:  $Therapeutic Exercise: 8-22 mins $Therapeutic Activity: 8-22 mins                    G Codes:  Functional Assessment Tool Used: AM-PAC 6 Clicks Basic Mobility    Roxanne Gates, PT, DPT   Roxanne Gates 04/22/2017, 9:13 AM

## 2017-04-22 NOTE — Progress Notes (Signed)
   Subjective: 2 Days Post-Op Procedure(s) (LRB): COMPUTER ASSISTED TOTAL KNEE ARTHROPLASTY (Right) Patient reports pain as 6 on 0-10 scale.   Patient is well, and has had no acute complaints or problems Did well with therapy.  Was able to ambulate 50 and 60 feet.  Was able to ascend and descend 8 steps.  Good range of motion of knee Plan is to go Home after hospital stay. no nausea and no vomiting Patient denies any chest pains or shortness of breath. Objective: Vital signs in last 24 hours: Temp:  [98 F (36.7 C)-98.3 F (36.8 C)] 98.3 F (36.8 C) (04/05 0109) Pulse Rate:  [58-65] 58 (04/05 0109) Resp:  [18-19] 19 (04/05 0109) BP: (102-130)/(53-77) 113/53 (04/05 0109) SpO2:  [93 %-97 %] 93 % (04/05 0109) well approximated incision Heels are non tender and elevated off the bed using rolled towels Intake/Output from previous day: 04/04 0701 - 04/05 0700 In: 1260 [P.O.:960; IV Piggyback:300] Out: 250 [Drains:250] Intake/Output this shift: No intake/output data recorded.  No results for input(s): HGB in the last 72 hours. No results for input(s): WBC, RBC, HCT, PLT in the last 72 hours. No results for input(s): NA, K, CL, CO2, BUN, CREATININE, GLUCOSE, CALCIUM in the last 72 hours. No results for input(s): LABPT, INR in the last 72 hours.  EXAM General - Patient is Alert, Appropriate and Oriented Extremity - Neurologically intact Neurovascular intact Sensation intact distally Intact pulses distally No cellulitis present Compartment soft Dressing - dressing C/D/I Motor Function - intact, moving foot and toes well on exam.    Past Medical History:  Diagnosis Date  . Abdominal pain, left lower quadrant 2012  . Arthritis   . Asthma   . Back pain 2005  . Breast screening, unspecified 2012  . Chronic cholecystitis 2012  . Constipation 2005  . Endocrine problem 2005  . GERD (gastroesophageal reflux disease) 2012  . Hyperlipidemia 2012  . Hypertension 2002  . Nausea  with vomiting 2012  . Obesity, unspecified 2012  . Pneumonia   . PONV (postoperative nausea and vomiting)   . Special screening for malignant neoplasms, colon 2012    Assessment/Plan: 2 Days Post-Op Procedure(s) (LRB): COMPUTER ASSISTED TOTAL KNEE ARTHROPLASTY (Right) Active Problems:   S/P total knee arthroplasty  Estimated body mass index is 40.27 kg/m as calculated from the following:   Height as of this encounter: 5\' 5"  (1.651 m).   Weight as of this encounter: 109.8 kg (242 lb). Up with therapy Discharge home with home health  Labs: None DVT Prophylaxis - Lovenox, Foot Pumps and TED hose Weight-Bearing as tolerated to right leg Hemovac was discontinued today.  The end of the drain appeared to be intact. Patient may be discharged home once she does laugh around the nurses desk and has a bowel movement. Please change dressing to operative leg, wash the operative leg and apply TED stockings to both legs. Please give the patient 2 extra honeycomb dressings to take home  Jeffery Bachmeier R. [place] 04/22/2017, 7:11 AM

## 2018-01-18 DIAGNOSIS — Z923 Personal history of irradiation: Secondary | ICD-10-CM

## 2018-01-18 HISTORY — DX: Personal history of irradiation: Z92.3

## 2018-01-30 DIAGNOSIS — D2262 Melanocytic nevi of left upper limb, including shoulder: Secondary | ICD-10-CM | POA: Diagnosis not present

## 2018-01-30 DIAGNOSIS — M71341 Other bursal cyst, right hand: Secondary | ICD-10-CM | POA: Diagnosis not present

## 2018-01-30 DIAGNOSIS — D2261 Melanocytic nevi of right upper limb, including shoulder: Secondary | ICD-10-CM | POA: Diagnosis not present

## 2018-01-30 DIAGNOSIS — D2272 Melanocytic nevi of left lower limb, including hip: Secondary | ICD-10-CM | POA: Diagnosis not present

## 2018-01-30 DIAGNOSIS — L821 Other seborrheic keratosis: Secondary | ICD-10-CM | POA: Diagnosis not present

## 2018-01-30 DIAGNOSIS — D2271 Melanocytic nevi of right lower limb, including hip: Secondary | ICD-10-CM | POA: Diagnosis not present

## 2018-01-30 DIAGNOSIS — D225 Melanocytic nevi of trunk: Secondary | ICD-10-CM | POA: Diagnosis not present

## 2018-02-23 DIAGNOSIS — I10 Essential (primary) hypertension: Secondary | ICD-10-CM | POA: Diagnosis not present

## 2018-02-23 DIAGNOSIS — Z79899 Other long term (current) drug therapy: Secondary | ICD-10-CM | POA: Diagnosis not present

## 2018-02-23 DIAGNOSIS — R7309 Other abnormal glucose: Secondary | ICD-10-CM | POA: Diagnosis not present

## 2018-02-23 DIAGNOSIS — E7800 Pure hypercholesterolemia, unspecified: Secondary | ICD-10-CM | POA: Diagnosis not present

## 2018-02-23 DIAGNOSIS — R8290 Unspecified abnormal findings in urine: Secondary | ICD-10-CM | POA: Diagnosis not present

## 2018-03-02 DIAGNOSIS — R7309 Other abnormal glucose: Secondary | ICD-10-CM | POA: Diagnosis not present

## 2018-03-02 DIAGNOSIS — E7800 Pure hypercholesterolemia, unspecified: Secondary | ICD-10-CM | POA: Diagnosis not present

## 2018-03-02 DIAGNOSIS — Z1239 Encounter for other screening for malignant neoplasm of breast: Secondary | ICD-10-CM | POA: Diagnosis not present

## 2018-03-02 DIAGNOSIS — I10 Essential (primary) hypertension: Secondary | ICD-10-CM | POA: Diagnosis not present

## 2018-03-02 DIAGNOSIS — Z79899 Other long term (current) drug therapy: Secondary | ICD-10-CM | POA: Diagnosis not present

## 2018-03-06 ENCOUNTER — Other Ambulatory Visit: Payer: Self-pay | Admitting: Internal Medicine

## 2018-03-06 DIAGNOSIS — Z1231 Encounter for screening mammogram for malignant neoplasm of breast: Secondary | ICD-10-CM

## 2018-03-27 ENCOUNTER — Ambulatory Visit
Admission: RE | Admit: 2018-03-27 | Discharge: 2018-03-27 | Disposition: A | Discharge status: Home / Self Care | Payer: PPO | Source: Ambulatory Visit | Attending: Internal Medicine | Admitting: Internal Medicine

## 2018-03-27 DIAGNOSIS — Z1231 Encounter for screening mammogram for malignant neoplasm of breast: Secondary | ICD-10-CM | POA: Diagnosis not present

## 2018-03-29 ENCOUNTER — Other Ambulatory Visit: Payer: Self-pay | Admitting: Internal Medicine

## 2018-03-29 DIAGNOSIS — R928 Other abnormal and inconclusive findings on diagnostic imaging of breast: Secondary | ICD-10-CM

## 2018-03-29 DIAGNOSIS — N6320 Unspecified lump in the left breast, unspecified quadrant: Secondary | ICD-10-CM

## 2018-04-07 ENCOUNTER — Other Ambulatory Visit: Payer: Self-pay

## 2018-04-07 ENCOUNTER — Ambulatory Visit
Admission: RE | Admit: 2018-04-07 | Discharge: 2018-04-07 | Disposition: A | Discharge status: Home / Self Care | Payer: PPO | Source: Ambulatory Visit | Attending: Internal Medicine | Admitting: Internal Medicine

## 2018-04-07 DIAGNOSIS — R928 Other abnormal and inconclusive findings on diagnostic imaging of breast: Secondary | ICD-10-CM

## 2018-04-07 DIAGNOSIS — N6320 Unspecified lump in the left breast, unspecified quadrant: Secondary | ICD-10-CM | POA: Insufficient documentation

## 2018-04-10 ENCOUNTER — Other Ambulatory Visit: Payer: Self-pay | Admitting: Internal Medicine

## 2018-04-10 DIAGNOSIS — R928 Other abnormal and inconclusive findings on diagnostic imaging of breast: Secondary | ICD-10-CM

## 2018-04-10 DIAGNOSIS — N6320 Unspecified lump in the left breast, unspecified quadrant: Secondary | ICD-10-CM

## 2018-04-13 ENCOUNTER — Ambulatory Visit
Admission: RE | Admit: 2018-04-13 | Discharge: 2018-04-13 | Disposition: A | Discharge status: Home / Self Care | Payer: PPO | Source: Ambulatory Visit | Attending: Internal Medicine | Admitting: Internal Medicine

## 2018-04-13 ENCOUNTER — Other Ambulatory Visit: Payer: Self-pay

## 2018-04-13 DIAGNOSIS — N6320 Unspecified lump in the left breast, unspecified quadrant: Secondary | ICD-10-CM

## 2018-04-13 DIAGNOSIS — N6322 Unspecified lump in the left breast, upper inner quadrant: Secondary | ICD-10-CM | POA: Diagnosis not present

## 2018-04-13 DIAGNOSIS — R928 Other abnormal and inconclusive findings on diagnostic imaging of breast: Secondary | ICD-10-CM

## 2018-04-13 DIAGNOSIS — C50212 Malignant neoplasm of upper-inner quadrant of left female breast: Secondary | ICD-10-CM | POA: Diagnosis not present

## 2018-04-13 HISTORY — PX: BREAST BIOPSY: SHX20

## 2018-04-17 ENCOUNTER — Encounter: Payer: Self-pay | Admitting: *Deleted

## 2018-04-17 DIAGNOSIS — C50912 Malignant neoplasm of unspecified site of left female breast: Secondary | ICD-10-CM

## 2018-04-17 NOTE — Progress Notes (Signed)
  Oncology Nurse Navigator Documentation  Navigator Location: [facility] (04/17/18 1500)   )Navigator Encounter Type: Introductory phone call (04/17/18 1500)   Abnormal Finding Date: 04/07/18 (04/17/18 1500) Confirmed Diagnosis Date: 04/14/18 (04/17/18 1500)                 Treatment Phase: Pre-Tx/Tx Discussion (04/17/18 1500) Barriers/Navigation Needs: Education;Coordination of Care (04/17/18 1500) Education: Newly Diagnosed Cancer Education (04/17/18 1500) Interventions: Coordination of Care (04/17/18 1500)   Coordination of Care: Appts (04/17/18 1500)                  Time Spent with Patient: 60 (04/17/18 1500)   Called and introduced navitigation services to the patient.  I have scheduled her to see Dr. Bary Castilla per her request on 04/20/18 @ 11:00.  She is to pick up her educational package for newly diagnosed breast cancer Patients at her appointment.  She is to call with any questions or needs.  I will call her back with a med onc appointment.

## 2018-04-18 ENCOUNTER — Encounter: Payer: Self-pay | Admitting: *Deleted

## 2018-04-18 ENCOUNTER — Other Ambulatory Visit: Payer: Self-pay | Admitting: Pathology

## 2018-04-18 LAB — SURGICAL PATHOLOGY

## 2018-04-18 NOTE — Progress Notes (Signed)
Patient returned my call.  She is aware of her med / onc appointment on Thursday at 9:00.  She was informed that Webb Silversmith will meet her that day and stay with her during her appointment with Dr. Tasia Catchings.

## 2018-04-18 NOTE — Progress Notes (Signed)
  Oncology Nurse Navigator Documentation  Navigator Location: [facility] (04/18/18 1000)   )Navigator Encounter Type: Telephone (04/18/18 1000) Telephone: Lahoma Crocker Call (04/18/18 1000)                                                  Time Spent with Patient: 15 (04/18/18 1000)   Left patient a message with her med onc appointment on 04/20/18 @ 9:00 with Dr. Tasia Catchings.  Requested that she return my call to confirm appointment.

## 2018-04-19 ENCOUNTER — Other Ambulatory Visit: Payer: Self-pay

## 2018-04-19 DIAGNOSIS — C801 Malignant (primary) neoplasm, unspecified: Secondary | ICD-10-CM

## 2018-04-19 DIAGNOSIS — C50919 Malignant neoplasm of unspecified site of unspecified female breast: Secondary | ICD-10-CM

## 2018-04-19 HISTORY — DX: Malignant (primary) neoplasm, unspecified: C80.1

## 2018-04-19 HISTORY — DX: Malignant neoplasm of unspecified site of unspecified female breast: C50.919

## 2018-04-20 ENCOUNTER — Inpatient Hospital Stay: Payer: PPO

## 2018-04-20 ENCOUNTER — Encounter: Payer: Self-pay | Admitting: General Surgery

## 2018-04-20 ENCOUNTER — Inpatient Hospital Stay: Payer: PPO | Attending: Oncology | Admitting: Oncology

## 2018-04-20 ENCOUNTER — Ambulatory Visit (INDEPENDENT_AMBULATORY_CARE_PROVIDER_SITE_OTHER): Payer: PPO | Admitting: General Surgery

## 2018-04-20 ENCOUNTER — Ambulatory Visit (INDEPENDENT_AMBULATORY_CARE_PROVIDER_SITE_OTHER): Payer: PPO

## 2018-04-20 ENCOUNTER — Other Ambulatory Visit: Payer: Self-pay

## 2018-04-20 ENCOUNTER — Other Ambulatory Visit: Payer: Self-pay | Admitting: Oncology

## 2018-04-20 ENCOUNTER — Encounter: Payer: Self-pay | Admitting: Oncology

## 2018-04-20 VITALS — BP 158/74 | HR 78 | Temp 97.9°F | Resp 14 | Ht 65.0 in | Wt 246.0 lb

## 2018-04-20 VITALS — BP 147/88 | HR 68 | Temp 98.5°F | Ht 65.0 in | Wt 246.6 lb

## 2018-04-20 DIAGNOSIS — C50812 Malignant neoplasm of overlapping sites of left female breast: Secondary | ICD-10-CM

## 2018-04-20 DIAGNOSIS — Z171 Estrogen receptor negative status [ER-]: Secondary | ICD-10-CM

## 2018-04-20 DIAGNOSIS — Z800 Family history of malignant neoplasm of digestive organs: Secondary | ICD-10-CM | POA: Diagnosis not present

## 2018-04-20 DIAGNOSIS — C50919 Malignant neoplasm of unspecified site of unspecified female breast: Secondary | ICD-10-CM

## 2018-04-20 DIAGNOSIS — Z808 Family history of malignant neoplasm of other organs or systems: Secondary | ICD-10-CM | POA: Diagnosis not present

## 2018-04-20 DIAGNOSIS — C50212 Malignant neoplasm of upper-inner quadrant of left female breast: Secondary | ICD-10-CM

## 2018-04-20 DIAGNOSIS — R109 Unspecified abdominal pain: Secondary | ICD-10-CM | POA: Diagnosis not present

## 2018-04-20 DIAGNOSIS — N6322 Unspecified lump in the left breast, upper inner quadrant: Secondary | ICD-10-CM

## 2018-04-20 DIAGNOSIS — Z803 Family history of malignant neoplasm of breast: Secondary | ICD-10-CM | POA: Diagnosis not present

## 2018-04-20 DIAGNOSIS — C50912 Malignant neoplasm of unspecified site of left female breast: Secondary | ICD-10-CM

## 2018-04-20 DIAGNOSIS — Z7189 Other specified counseling: Secondary | ICD-10-CM

## 2018-04-20 DIAGNOSIS — Z809 Family history of malignant neoplasm, unspecified: Secondary | ICD-10-CM

## 2018-04-20 MED ORDER — LIDOCAINE-PRILOCAINE 2.5-2.5 % EX KIT: PACK | CUTANEOUS | 0 refills | Status: DC

## 2018-04-20 NOTE — Patient Instructions (Addendum)
Patient's surgery to be scheduled for 04/26/18 at [facility] with Dr. Bary Castilla  The patient is aware she will need to go to Pre-Admit Testing on 04/24/18 at 9:00 am. She will go through the [place] entrance of [facility].   She is aware to check in at the [place] entrance the morning of surgery where she will be screened for the coronavirus and then sent to Radiology. She will need to be there at 7:45 am that day.   The patient is aware to place a small amount of EMLA cream on her left areola and cover with plastic wrap one hour prior to leaving for surgery.  Patient aware that she may have no visitors and driver will need to wait in the car due to COVID-19 restrictions.   The patient verbalizes understanding of the above.   The patient is aware to call the office should she have further questions.

## 2018-04-20 NOTE — Progress Notes (Signed)
Patient ID: Megan Harris, female   DOB: Jun 04, 1950, 68 y.o.   MRN: [id number]  Chief Complaint  Patient presents with  . New Patient (Initial Visit)    Left Breast Cancer    HPI Megan Harris is a 68 y.o. female here to discuss her recent diagnosis of left breast cancer.  The most recent mammogram was done on 03/27/18. She had repeat mammograms done on 04/07/18 and a biopsy done on 04/13/18. She did not notice anything different with her breasts.    Patient does perform regular self breast checks and gets regular mammograms done.   She did have a fall 2 years ago and landed hard on her left breast.   The patient met with Dr. Tasia Catchings from medical oncology earlier this morning.   HPI  Past Medical History:  Diagnosis Date  . Abdominal pain, left lower quadrant 2012  . Arthritis   . Asthma   . Back pain 2005  . Breast screening, unspecified 2012  . Chronic cholecystitis 2012  . Constipation 2005  . Endocrine problem 2005  . GERD (gastroesophageal reflux disease) 2012  . Hyperlipidemia 2012  . Hypertension 2002  . Nausea with vomiting 2012  . Obesity, unspecified 2012  . Pneumonia   . PONV (postoperative nausea and vomiting)   . Special screening for malignant neoplasms, colon 2012    Past Surgical History:  Procedure Laterality Date  . BILATERAL CARPAL TUNNEL RELEASE Bilateral   . BREAST BIOPSY Left 04/13/2018   Pending Path  . BREAST EXCISIONAL BIOPSY Left early 90s   neg  . BUNIONECTOMY Bilateral 2003  . CHOLECYSTECTOMY  06/04/2010  . COLON RESECTION  2004   due diverticulitis   . COLONOSCOPY  2005   [place], Dr. Bary Castilla  . COLONOSCOPY  06/28/2012  . COLOSTOMY  2004  . COLOSTOMY REVERSAL  2004  . FOOT SURGERY Bilateral 2012   plantar faciatis  . HAND SURGERY Bilateral    carpal tunnel  . HERNIA REPAIR  2005   at colostomy site after reversal done  . KNEE ARTHROPLASTY Right 04/20/2017   Procedure: COMPUTER ASSISTED TOTAL KNEE ARTHROPLASTY;  Surgeon: Dereck Leep, MD;   Location: [facility] ORS;  Service: Orthopedics;  Laterality: Right;  . KNEE ARTHROSCOPY Right 02/03/2015   Procedure: ARTHROSCOPY right knee, partial medial menisectomy, condyle malleolus, patella and femoral;  Surgeon: Dereck Leep, MD;  Location: [facility] ORS;  Service: Orthopedics;  Laterality: Right;  . NASAL SINUS SURGERY     2005  . SALPINGOOPHORECTOMY  1998  . TONSILLECTOMY    . TUBAL LIGATION  1978  . UPPER GI ENDOSCOPY  06/28/2012  . VAGINAL HYSTERECTOMY  1987    Family History  Problem Relation Age of Onset  . Heart disease Father   . Diabetes Paternal Grandmother   . Breast cancer Paternal Grandmother 59  . Diabetes Paternal Grandfather   . Brain cancer Mother 107  . Bone cancer Sister 43  . Colon cancer Maternal Uncle   . Ovarian cancer Neg Hx     Social History Social History   Tobacco Use  . Smoking status: Never Smoker  . Smokeless tobacco: Never Used  Substance Use Topics  . Alcohol use: Yes    Comment: socially  . Drug use: No    Allergies  Allergen Reactions  . Codeine Other (See Comments)    Breathing problems  . Penicillins Hives    Did it involve swelling of the face/tongue/throat, SOB, or low BP? No Did  it involve sudden or severe rash/hives, skin peeling, or any reaction on the inside of your mouth or nose? No Did you need to seek medical attention at a hospital or doctor's office? Yes When did it last happen?50s If all above answers are "NO", may proceed with cephalosporin use.     Current Outpatient Medications  Medication Sig Dispense Refill  . albuterol (PROVENTIL HFA;VENTOLIN HFA) 108 (90 Base) MCG/ACT inhaler Inhale 2 puffs into the lungs every 6 (six) hours as needed for wheezing or shortness of breath.     Marland Kitchen amLODipine (NORVASC) 5 MG tablet Take 5 mg by mouth daily.    Marland Kitchen atorvastatin (LIPITOR) 10 MG tablet Take 10 mg by mouth daily.    . cetirizine (ZYRTEC) 10 MG tablet Take 10 mg by mouth daily.    . Cholecalciferol (VITAMIN D3  MAXIMUM STRENGTH) 5000 units capsule Take 5,000 Units by mouth daily.    Marland Kitchen dextromethorphan-guaiFENesin (MUCINEX DM) 30-600 MG 12hr tablet Take 1 tablet by mouth daily as needed for cough.    . gabapentin (NEURONTIN) 300 MG capsule Take 300 mg by mouth 3 (three) times daily.    . hydrochlorothiazide (HYDRODIURIL) 25 MG tablet Take 25 mg by mouth daily.    . lansoprazole (PREVACID) 30 MG capsule Take 30 mg by mouth 2 (two) times daily.     Marland Kitchen levothyroxine (SYNTHROID, LEVOTHROID) 50 MCG tablet Take 50 mcg by mouth daily before breakfast.     . montelukast (SINGULAIR) 10 MG tablet Take 10 mg by mouth at bedtime.    Marland Kitchen zolpidem (AMBIEN) 10 MG tablet Take 10 mg by mouth at bedtime.     . lidocaine-prilocaine (EMLA) cream Apply to left areola and cover with plastic wrap one hour prior to leaving for surgery 1 each 0  . losartan (COZAAR) 100 MG tablet Take 100 mg by mouth daily.     No current facility-administered medications for this visit.     Review of Systems Review of Systems  Constitutional: Negative.   Respiratory: Negative.   Cardiovascular: Negative.     Blood pressure (!) 158/74, pulse 78, temperature 97.9 F (36.6 C), resp. rate 14, height 5' 5" (1.651 m), weight 246 lb (111.6 kg), SpO2 95 %.  Physical Exam Physical Exam Exam conducted with a chaperone present.  Constitutional:      Appearance: She is well-developed.  Eyes:     General: No scleral icterus.    Conjunctiva/sclera: Conjunctivae normal.  Neck:     Musculoskeletal: Neck supple.  Cardiovascular:     Rate and Rhythm: Normal rate and regular rhythm.     Heart sounds: Normal heart sounds.  Pulmonary:     Effort: Pulmonary effort is normal.     Breath sounds: Normal breath sounds.  Chest:     Breasts:        Right: No inverted nipple, mass, nipple discharge, skin change or tenderness.        Left: No inverted nipple, mass, nipple discharge, skin change or tenderness.       Comments: Left breast post biopsy  bruising noted  Lymphadenopathy:     Cervical: No cervical adenopathy.     Upper Body:     Right upper body: No axillary adenopathy.     Left upper body: No axillary adenopathy.  Skin:    General: Skin is warm and dry.  Neurological:     Mental Status: She is alert and oriented to person, place, and time.     Data  Reviewed The direct mammographic images and ultrasound images from her mammograms dating back to 2018 to the present were independently reviewed at the time of her visit.  The new density in the 11 o'clock position was not evident on her 2019 films.  Ultrasound describes a hypoechoic mass 7 cm from the nipple which was subsequently biopsied.  The multiple enlarged lymph nodes evident on the last several mammographic studies were not felt to be atypical by ultrasound.  DIAGNOSIS:  A. BREAST, LEFT 11:00; ULTRASOUND-GUIDED BIOPSY:  - INVASIVE MAMMARY CARCINOMA, NO SPECIAL TYPE.   BREAST BIOMARKER TESTS  Estrogen Receptor (ER) Status: NEGATIVE (LESS THAN 1%)            Internal controls cells present and stain as  expected   Progesterone Receptor (PgR) Status: NEGATIVE (LESS THAN 1%)            Internal controls cells present and stainas  expected   HER2 (by immunohistochemistry): NEGATIVE (Score 0)   Ultrasound exam was undertaken to determine that wire localization would not be required prior to surgical intervention.  The breast is essentially fatty replaced on mammography and the only lesion identified during scanning of the upper inner quadrant was at the 11 o'clock position, 10 cm from the nipple where a hypoechoic mass measuring 0.86 x 1.02 x 1.21 cm with evidence of a metallic clip was identified.  This corresponds with the [place] study pre-biopsy.  The lesion comes within 1 cm of the overlying skin.  BI-RADS-6.  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  Plan In text discussion with medical oncology she would have been a  candidate for neoadjuvant treatment but due to the presence of the [place] 19 pandemic she is a candidate for primary surgical intervention.  The majority of the visit was spent reviewing the options for breast cancer treatment. Breast conservation with lumpectomy and radiation therapy  was presented as equivalent to mastectomy for long-term control. The pros and cons of each treatment regimen were reviewed. The indications for additional therapy such as chemotherapy were touched on briefly, realizing that the majority of information required to determine if chemotherapy would be of benefit is not available at this time. The availability of second surgical opinion was reviewed.  The patient has a fairly small tumor (1.2 cm) and a sizable breast volume 42 DD, and I think she would do well with breast conservation.  An informational brochure was provided as well as to reputable websites for her home research.  The patient was encouraged to call should she have any questions.    At this time she is leaning towards breast conservation.  The patient had met with medical oncology earlier in the day and was felt to be a candidate for adjuvant chemotherapy due to her triple negative disease and a request was made for PowerPort placement at the time of surgical intervention.  The risks associated with central venous access including arterial, pulmonary and venous injury were reviewed. The possible need for additional treatment if pulmonary injury occurs (chest tube placement) was discussed.  The use of EMLA cream prior to presentation for surgery to minimize discomfort during the sentinel node injection was reviewed.  More than 50% of the visit was spent in face-to-face consultation regarding options for management.  HPI, Physical Exam, Assessment and Plan have been scribed under the direction and in the presence of Robert Bellow, MD  Concepcion Living, LPN  I have completed the exam and  reviewed the  above documentation for accuracy and completeness.  I agree with the above.  Haematologist has been used and any errors in dictation or transcription are unintentional.  Hervey Ard, M.D., F.A.C.S.  [place] Soul Hackman 04/20/2018, 5:09 PM

## 2018-04-20 NOTE — Progress Notes (Signed)
Hematology/Oncology Consult note [facility] Telephone:([phone] Fax:([phone]) [phone]   Patient Care Team: Idelle Crouch, MD as PCP - General (Internal Medicine) Bary Castilla Forest Gleason, MD as Consulting Physician (General Surgery)  REFERRING PROVIDER: Idelle Crouch, MD CHIEF COMPLAINTS/REASON FOR VISIT:  Evaluation of breast cancer  HISTORY OF PRESENTING ILLNESS:  SHAYLEN NEPHEW is a  68 y.o.  female with PMH listed below who was referred to me for evaluation of breast cancer  Patient had screening mammogram on 03/27/2018 which showed left breast mass. She had diagnostic and ultrasound on 04/07/2018 which showed 0.9cm x 0.9cm x 1.4cm irregular border mixed echotexture mass at the left breast 11 o'clock 7 cm from nipple correlating to the mammographic finding. Ultrasound of the left axilla is negative She underwent biopsy of left breast mass.   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 Estrogen receptor negative, progesterone receptor negative, HER-2 negative.  Nipple discharge: Denies Family history: Paternal grandmother had breast cancer, mother deceased from brain cancer, sister deceased from bone cancer, maternal uncle had colon cancer. OCP use: Short-term use remotely Estrogen and progesterone therapy: denies History of radiation to chest: denies.  Previous breast surgery: Biopsy of left breast in early 90s.  Negative per patient.  History of complete hysterectomy due to endometriosis. Today patient reports feeling anxious about the new diagnosis.  Otherwise doing well.  No complaints.  Her daughter Marzetta Board is connected with FaceTime through entire encounter per patient's preference.  Review of Systems  Constitutional: Negative for appetite change, chills, fatigue and fever.  HENT:   Negative for hearing loss and voice change.   Eyes: Negative for eye  problems.  Respiratory: Negative for chest tightness and cough.   Cardiovascular: Negative for chest pain.  Gastrointestinal: Negative for abdominal distention, abdominal pain and blood in stool.  Endocrine: Negative for hot flashes.  Genitourinary: Negative for difficulty urinating and frequency.   Musculoskeletal: Negative for arthralgias.  Skin: Negative for itching and rash.  Neurological: Negative for extremity weakness.  Hematological: Negative for adenopathy.  Psychiatric/Behavioral: Negative for confusion. The patient is nervous/anxious.     MEDICAL HISTORY:  Past Medical History:  Diagnosis Date  . Abdominal pain, left lower quadrant 2012  . Arthritis   . Asthma   . Back pain 2005  . Breast screening, unspecified 2012  . Chronic cholecystitis 2012  . Constipation 2005  . Endocrine problem 2005  . GERD (gastroesophageal reflux disease) 2012  . Hyperlipidemia 2012  . Hypertension 2002  . Nausea with vomiting 2012  . Obesity, unspecified 2012  . Pneumonia   . PONV (postoperative nausea and vomiting)   . Special screening for malignant neoplasms, colon 2012    SURGICAL HISTORY: Past Surgical History:  Procedure Laterality Date  . BILATERAL CARPAL TUNNEL RELEASE Bilateral   . BREAST BIOPSY Left 04/13/2018   Pending Path  . BREAST EXCISIONAL BIOPSY Left early 90s   neg  . BUNIONECTOMY Bilateral 2003  . CHOLECYSTECTOMY  06/04/2010  . COLON RESECTION  2004   due diverticulitis   . COLONOSCOPY  2005   [place], Dr. Bary Castilla  . COLONOSCOPY  06/28/2012  . COLOSTOMY  2004  . COLOSTOMY REVERSAL  2004  . FOOT SURGERY Bilateral 2012   plantar faciatis  . HAND SURGERY Bilateral    carpal tunnel  . HERNIA REPAIR  2005   at colostomy site after reversal done  . KNEE ARTHROPLASTY Right 04/20/2017  Procedure: COMPUTER ASSISTED TOTAL KNEE ARTHROPLASTY;  Surgeon: Dereck Leep, MD;  Location: [facility] ORS;  Service: Orthopedics;  Laterality: Right;  . KNEE ARTHROSCOPY Right  02/03/2015   Procedure: ARTHROSCOPY right knee, partial medial menisectomy, condyle malleolus, patella and femoral;  Surgeon: Dereck Leep, MD;  Location: [facility] ORS;  Service: Orthopedics;  Laterality: Right;  . NASAL SINUS SURGERY     2005  . SALPINGOOPHORECTOMY  1998  . TONSILLECTOMY    . TUBAL LIGATION  1978  . UPPER GI ENDOSCOPY  06/28/2012  . VAGINAL HYSTERECTOMY  1987    SOCIAL HISTORY: Social History   Socioeconomic History  . Marital status: Married    Spouse name: Not on file  . Number of children: 2  . Years of education: Not on file  . Highest education level: Not on file  Occupational History  . Not on file  Social Needs  . Financial resource strain: Not on file  . Food insecurity:    Worry: Not on file    Inability: Not on file  . Transportation needs:    Medical: Not on file    Non-medical: Not on file  Tobacco Use  . Smoking status: Never Smoker  . Smokeless tobacco: Never Used  Substance and Sexual Activity  . Alcohol use: Yes    Comment: socially  . Drug use: No  . Sexual activity: Yes    Birth control/protection: Surgical  Lifestyle  . Physical activity:    Days per week: Not on file    Minutes per session: Not on file  . Stress: Not on file  Relationships  . Social connections:    Talks on phone: Not on file    Gets together: Not on file    Attends religious service: Not on file    Active member of club or organization: Not on file    Attends meetings of clubs or organizations: Not on file    Relationship status: Not on file  . Intimate partner violence:    Fear of current or ex partner: Not on file    Emotionally abused: Not on file    Physically abused: Not on file    Forced sexual activity: Not on file  Other Topics Concern  . Not on file  Social History Narrative  . Not on file   Lives at home with husband.  FAMILY HISTORY: Family History  Problem Relation Age of Onset  . Heart disease Father   . Diabetes Paternal Grandmother    . Breast cancer Paternal Grandmother 27  . Diabetes Paternal Grandfather   . Brain cancer Mother 14  . Bone cancer Sister 89  . Colon cancer Maternal Uncle   . Ovarian cancer Neg Hx     ALLERGIES:  is allergic to codeine; penicillins; and tape.  MEDICATIONS:  Current Outpatient Medications  Medication Sig Dispense Refill  . albuterol (PROVENTIL HFA;VENTOLIN HFA) 108 (90 Base) MCG/ACT inhaler INHALE 2 PUFFS BY MOUTH EVERY 6 HOURS AS NEEDED FOR WHEEZE    . amLODipine (NORVASC) 5 MG tablet Take 5 mg by mouth daily.    Marland Kitchen atorvastatin (LIPITOR) 10 MG tablet Take 10 mg by mouth daily.    . Calcium Citrate-Vitamin D (CALCIUM CITRATE + D PO) Take 1 tablet by mouth daily.    . cetirizine (ZYRTEC) 10 MG tablet Take 10 mg by mouth daily.    . Cholecalciferol (VITAMIN D3 MAXIMUM STRENGTH) 5000 units capsule Take 5,000 Units by mouth daily.    Marland Kitchen  dextromethorphan-guaiFENesin (MUCINEX DM) 30-600 MG 12hr tablet Take 1 tablet by mouth daily as needed for cough.    . gabapentin (NEURONTIN) 300 MG capsule Take 300 mg by mouth 3 (three) times daily.    . hydrochlorothiazide (HYDRODIURIL) 25 MG tablet Take 25 mg by mouth daily.    . lansoprazole (PREVACID) 30 MG capsule Take 30 mg by mouth 2 (two) times daily.     Marland Kitchen levothyroxine (SYNTHROID, LEVOTHROID) 50 MCG tablet Take 50 mcg by mouth daily.    . montelukast (SINGULAIR) 10 MG tablet Take 10 mg by mouth at bedtime.    Marland Kitchen zolpidem (AMBIEN) 10 MG tablet Take 10 mg by mouth at bedtime as needed for sleep.    Marland Kitchen lidocaine-prilocaine (EMLA) cream Apply to left areola and cover with plastic wrap one hour prior to leaving for surgery 1 each 0  . losartan-hydrochlorothiazide (HYZAAR) 100-25 MG tablet Take 1 tablet daily by mouth.     No current facility-administered medications for this visit.      PHYSICAL EXAMINATION: ECOG PERFORMANCE STATUS: 0 - Asymptomatic Vitals:   04/20/18 0916  BP: (!) 147/88  Pulse: 68  Temp: 98.5 F (36.9 C)   Filed Weights    04/20/18 0916  Weight: 246 lb 9 oz (111.8 kg)    Physical Exam Constitutional:      General: She is not in acute distress.    Appearance: She is obese.  HENT:     Head: Normocephalic and atraumatic.  Eyes:     General: No scleral icterus.    Pupils: Pupils are equal, round, and reactive to light.  Neck:     Musculoskeletal: Normal range of motion and neck supple.  Cardiovascular:     Rate and Rhythm: Normal rate and regular rhythm.     Heart sounds: Normal heart sounds.  Pulmonary:     Effort: Pulmonary effort is normal. No respiratory distress.     Breath sounds: No wheezing.  Abdominal:     General: Bowel sounds are normal. There is no distension.     Palpations: Abdomen is soft. There is no mass.     Tenderness: There is no abdominal tenderness.  Musculoskeletal: Normal range of motion.        General: No deformity.  Skin:    General: Skin is warm and dry.     Findings: No erythema or rash.  Neurological:     Mental Status: She is alert and oriented to person, place, and time.     Cranial Nerves: No cranial nerve deficit.     Coordination: Coordination normal.  Psychiatric:        Behavior: Behavior normal.        Thought Content: Thought content normal.   Left breast palpable breast mass 11:00.  Skin bruising at the site of biopsy. -[Patient reports not able to fill this mass before surgery.  This could have been small hematoma secondary biopsy.] No palpable left axillary lymphadenopathy. No palpable breast mass or extra lymph node on the right side.  LABORATORY DATA:  I have reviewed the data as listed Lab Results  Component Value Date   WBC 5.4 04/13/2017   HGB 14.4 04/13/2017   HCT 42.5 04/13/2017   MCV 88.1 04/13/2017   PLT 199 04/13/2017   No results for input(s): NA, K, CL, CO2, GLUCOSE, BUN, CREATININE, CALCIUM, GFRNONAA, GFRAA, PROT, ALBUMIN, AST, ALT, ALKPHOS, BILITOT, BILIDIR, IBILI in the last 8760 hours. Iron/TIBC/Ferritin/ %Sat No results found  for: IRON, TIBC, FERRITIN,  IRONPCTSAT   RADIOGRAPHIC STUDIES: I have personally reviewed the radiological images as listed and agreed with the findings in the report. 04/07/2018 Unilateral left breast diagnostic mammogram.  Spot compression CC and MLO views of the left breast are submitted. The previously questioned mass in the upper slight medial left breast persists on additional views. Targeted ultrasound is performed, showing a 0.9 x 0.9 x 1.4 cm irregular border mixed echotexture mass at the left breast 11 o'clock 7 cm from nipple correlating to the mammographic finding. Ultrasound of the left axilla is negative. IMPRESSION: Suspicious findings.  Pathology 04/13/2018 CASE: [id number]  PATIENT: Clovis Cao  Surgical Pathology Report  DIAGNOSIS:  A. BREAST, LEFT 11:00; ULTRASOUND-GUIDED BIOPSY:  - INVASIVE MAMMARY CARCINOMA, NO SPECIAL TYPE.   Size of invasive carcinoma: 7 mm in this sample, in 3 of 3 cores  Histologic grade of invasive carcinoma: Grade 3            Glandular/tubular differentiation score: 3            Nuclear pleomorphism score: 2            Mitotic rate score: 3            Total score: 9  Ductal carcinoma in situ: Not identified  Lymphovascular invasion: Not identified   ER/PR/HER2: Immunohistochemistry will be performed on block A1, with  reflex to [place] for HER2 2+. The results will be reported in an addendum.   ADDENDUM:  Additional IHC stains were performed given histologic features and  breast biomarker results (triple negative). The carcinoma demonstrates  the following pattern of immunoreactivity:  GATA-3: Positive  SOX-10: Positive  CK 5/6: Positive (subset)  p63: Negative  CD117: Positive  This pattern of immunohistochemical staining supports a high-grade  triple negative mammary carcinoma and excludes an adenoid cystic  carcinoma.   BREAST BIOMARKER TESTS  Estrogen Receptor (ER) Status:  NEGATIVE (LESS THAN 1%)  Internal controls cells present and stain as expected   Progesterone Receptor (PgR) Status: NEGATIVE (LESS THAN 1%)  Internal controls cells present and stainas expected   HER2 (by immunohistochemistry): NEGATIVE (Score 0)   ASSESSMENT & PLAN:  1. Malignant neoplasm of overlapping sites of left breast in female, estrogen receptor negative ([place])   2. 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 Family history of cancer   4. Goals of care, counseling/discussion    Images were independently reviewed by me and discussed with patient. Also discussed with patient about the breast mass biopsy results. Triple negative breast cancer diagnosis was discussed with patient and his daughter [FaceTime] Clinically T1 N0 Mx Goal of care is with curative intent.  As this is a triple negative breast cancer, I would have offered neoadjuvant chemotherapy if we were not in the COVID-19 virus outbreak.  Currently due to the outbreak, I recommend proceeding with neoadjuvant chemotherapy which will cause immunocompromise state and potentially increase her risk of infection. I recommend proceed with lumpectomy with sentinel lymph node biopsy followed by adjuvant chemotherapy. We have also discussed about placement of Mediport to facilitate adjuvant chemotherapy.  She is in agreement. Check cbc, cmp, CA 27.29, CA15-3  Given the fact of triple negative breast cancer, family history of brain cancer, colon cancer, breast cancer and bone cancer, I highly recommend patient to establish care with genetic counselor and proceed with genetic testing.  Will refer.  Patient has met breast cancer RN navigator Lelon Frohlich who will coordinate her care.  Discussed with Dr.Byrnett about  patient's case. Patient has appointment to see him today.   Orders Placed This Encounter  Procedures  . Cancer antigen 27.29    Standing Status:   Future    Number of Occurrences:   1    Standing Expiration Date:    04/20/2019  . Cancer antigen 15-3    Standing Status:   Future    Number of Occurrences:   1    Standing Expiration Date:   04/20/2019  . Comprehensive metabolic panel    Standing Status:   Future    Number of Occurrences:   1    Standing Expiration Date:   04/20/2019  . CBC with Differential/Platelet    Standing Status:   Future    Number of Occurrences:   1    Standing Expiration Date:   04/20/2019    All questions were answered. The patient knows to call the clinic with any problems questions or concerns.  Return of visit: 1-2 weeks after surgery to discuss pathology results and further management plan.  Dr.Sparks, thank you for this kind referral and the opportunity to participate in the care of this patient. A copy of today's note is routed to referring provider  We spent sufficient time to discuss many aspect of care, questions were answered to patient's satisfaction. Total face to face encounter time for this patient visit was 80 min. >50% of the time was  spent in counseling and coordination of care.    Earlie Server, MD, PhD Hematology Oncology [facility] at [facility] Pager- [phone] 04/20/2018

## 2018-04-20 NOTE — Progress Notes (Signed)
Patient here today as a new patient with left breast cancer

## 2018-04-20 NOTE — Progress Notes (Signed)
  Oncology Nurse Navigator Documentation  Navigator Location: [facility]Med [facility]c (04/20/18 1700) Referral date to RadOnc/MedOnc: 04/20/18 (04/20/18 1700) )Navigator Encounter Type: Initial MedOnc (04/20/18 1700)       Surgery Date: 04/26/18 (04/20/18 1700)             Patient Visit Type: Initial (04/20/18 1700) Treatment Phase: Pre-Tx/Tx Discussion (04/20/18 1700) Barriers/Navigation Needs: Family concerns;Education;Coordination of Care (04/20/18 1700) Education: Understanding Cancer/ Treatment Options;Coping with Diagnosis/ Prognosis;Pain/ Symptom Management;Newly Diagnosed Cancer Education (04/20/18 1700) Interventions: Referrals;Coordination of Care;Education (04/20/18 1700) Referrals: Genetics (04/20/18 1700) Coordination of Care: Appts (04/20/18 1700) Education Method: Teach-back;Verbal;Written (04/20/18 1700)                Time Spent with Patient: 90 (04/20/18 1700)   Supported patient, and daughter Marzetta Board at initial Med/Onc visit, and through phone call after MD visits today.  Given Breast Cancer Treatment Handbook/folder with hospital services.  States she is scheduled for surgery 04/26/2018, and verbalizes understanding of treatment plan as it was presented today.

## 2018-04-24 ENCOUNTER — Other Ambulatory Visit: Payer: Self-pay

## 2018-04-24 ENCOUNTER — Encounter
Admission: RE | Admit: 2018-04-24 | Discharge: 2018-04-24 | Disposition: A | Discharge status: Home / Self Care | Payer: PPO | Source: Ambulatory Visit | Attending: General Surgery | Admitting: General Surgery

## 2018-04-24 DIAGNOSIS — S299XXA Unspecified injury of thorax, initial encounter: Secondary | ICD-10-CM | POA: Diagnosis not present

## 2018-04-24 DIAGNOSIS — Z13820 Encounter for screening for osteoporosis: Secondary | ICD-10-CM | POA: Diagnosis not present

## 2018-04-24 DIAGNOSIS — Z961 Presence of intraocular lens: Secondary | ICD-10-CM | POA: Diagnosis not present

## 2018-04-24 DIAGNOSIS — I82431 Acute embolism and thrombosis of right popliteal vein: Secondary | ICD-10-CM | POA: Diagnosis not present

## 2018-04-24 DIAGNOSIS — E7800 Pure hypercholesterolemia, unspecified: Secondary | ICD-10-CM | POA: Diagnosis not present

## 2018-04-24 DIAGNOSIS — Z23 Encounter for immunization: Secondary | ICD-10-CM | POA: Diagnosis not present

## 2018-04-24 DIAGNOSIS — R300 Dysuria: Secondary | ICD-10-CM | POA: Diagnosis not present

## 2018-04-24 DIAGNOSIS — Z951 Presence of aortocoronary bypass graft: Secondary | ICD-10-CM | POA: Diagnosis not present

## 2018-04-24 DIAGNOSIS — Z5181 Encounter for therapeutic drug level monitoring: Secondary | ICD-10-CM | POA: Diagnosis not present

## 2018-04-24 DIAGNOSIS — M2560 Stiffness of unspecified joint, not elsewhere classified: Secondary | ICD-10-CM | POA: Diagnosis not present

## 2018-04-24 DIAGNOSIS — G4733 Obstructive sleep apnea (adult) (pediatric): Secondary | ICD-10-CM | POA: Diagnosis not present

## 2018-04-24 DIAGNOSIS — Z95828 Presence of other vascular implants and grafts: Secondary | ICD-10-CM | POA: Diagnosis not present

## 2018-04-24 DIAGNOSIS — Z01818 Encounter for other preprocedural examination: Secondary | ICD-10-CM | POA: Insufficient documentation

## 2018-04-24 DIAGNOSIS — Z20828 Contact with and (suspected) exposure to other viral communicable diseases: Secondary | ICD-10-CM | POA: Diagnosis not present

## 2018-04-24 DIAGNOSIS — H02831 Dermatochalasis of right upper eyelid: Secondary | ICD-10-CM | POA: Diagnosis not present

## 2018-04-24 DIAGNOSIS — E785 Hyperlipidemia, unspecified: Secondary | ICD-10-CM | POA: Diagnosis not present

## 2018-04-24 DIAGNOSIS — H11153 Pinguecula, bilateral: Secondary | ICD-10-CM | POA: Diagnosis not present

## 2018-04-24 DIAGNOSIS — R200 Anesthesia of skin: Secondary | ICD-10-CM | POA: Diagnosis not present

## 2018-04-24 DIAGNOSIS — I208 Other forms of angina pectoris: Secondary | ICD-10-CM | POA: Diagnosis not present

## 2018-04-24 DIAGNOSIS — H25013 Cortical age-related cataract, bilateral: Secondary | ICD-10-CM | POA: Diagnosis not present

## 2018-04-24 DIAGNOSIS — I10 Essential (primary) hypertension: Secondary | ICD-10-CM | POA: Insufficient documentation

## 2018-04-24 DIAGNOSIS — Z7984 Long term (current) use of oral hypoglycemic drugs: Secondary | ICD-10-CM | POA: Diagnosis not present

## 2018-04-24 DIAGNOSIS — H2513 Age-related nuclear cataract, bilateral: Secondary | ICD-10-CM | POA: Diagnosis not present

## 2018-04-24 DIAGNOSIS — H02834 Dermatochalasis of left upper eyelid: Secondary | ICD-10-CM | POA: Diagnosis not present

## 2018-04-24 DIAGNOSIS — H524 Presbyopia: Secondary | ICD-10-CM | POA: Diagnosis not present

## 2018-04-24 DIAGNOSIS — H5213 Myopia, bilateral: Secondary | ICD-10-CM | POA: Diagnosis not present

## 2018-04-24 DIAGNOSIS — Z79899 Other long term (current) drug therapy: Secondary | ICD-10-CM | POA: Diagnosis not present

## 2018-04-24 DIAGNOSIS — K1379 Other lesions of oral mucosa: Secondary | ICD-10-CM | POA: Diagnosis not present

## 2018-04-24 DIAGNOSIS — H52202 Unspecified astigmatism, left eye: Secondary | ICD-10-CM | POA: Diagnosis not present

## 2018-04-24 DIAGNOSIS — R0789 Other chest pain: Secondary | ICD-10-CM | POA: Diagnosis not present

## 2018-04-24 DIAGNOSIS — Z125 Encounter for screening for malignant neoplasm of prostate: Secondary | ICD-10-CM | POA: Diagnosis not present

## 2018-04-24 DIAGNOSIS — I701 Atherosclerosis of renal artery: Secondary | ICD-10-CM | POA: Diagnosis not present

## 2018-04-24 DIAGNOSIS — Z79890 Hormone replacement therapy (postmenopausal): Secondary | ICD-10-CM | POA: Diagnosis not present

## 2018-04-24 DIAGNOSIS — C672 Malignant neoplasm of lateral wall of bladder: Secondary | ICD-10-CM | POA: Diagnosis not present

## 2018-04-24 DIAGNOSIS — M629 Disorder of muscle, unspecified: Secondary | ICD-10-CM | POA: Diagnosis not present

## 2018-04-24 DIAGNOSIS — M79602 Pain in left arm: Secondary | ICD-10-CM | POA: Diagnosis not present

## 2018-04-24 DIAGNOSIS — Z01812 Encounter for preprocedural laboratory examination: Secondary | ICD-10-CM | POA: Diagnosis not present

## 2018-04-24 DIAGNOSIS — M810 Age-related osteoporosis without current pathological fracture: Secondary | ICD-10-CM | POA: Diagnosis not present

## 2018-04-24 DIAGNOSIS — E118 Type 2 diabetes mellitus with unspecified complications: Secondary | ICD-10-CM | POA: Insufficient documentation

## 2018-04-24 DIAGNOSIS — I6523 Occlusion and stenosis of bilateral carotid arteries: Secondary | ICD-10-CM | POA: Diagnosis not present

## 2018-04-24 DIAGNOSIS — S42214A Unspecified nondisplaced fracture of surgical neck of right humerus, initial encounter for closed fracture: Secondary | ICD-10-CM | POA: Diagnosis not present

## 2018-04-24 DIAGNOSIS — E119 Type 2 diabetes mellitus without complications: Secondary | ICD-10-CM | POA: Diagnosis not present

## 2018-04-24 DIAGNOSIS — E782 Mixed hyperlipidemia: Secondary | ICD-10-CM | POA: Diagnosis not present

## 2018-04-24 DIAGNOSIS — I25810 Atherosclerosis of coronary artery bypass graft(s) without angina pectoris: Secondary | ICD-10-CM | POA: Diagnosis not present

## 2018-04-24 DIAGNOSIS — J90 Pleural effusion, not elsewhere classified: Secondary | ICD-10-CM | POA: Diagnosis not present

## 2018-04-24 DIAGNOSIS — D696 Thrombocytopenia, unspecified: Secondary | ICD-10-CM | POA: Diagnosis not present

## 2018-04-24 DIAGNOSIS — Z6841 Body Mass Index (BMI) 40.0 and over, adult: Secondary | ICD-10-CM | POA: Diagnosis not present

## 2018-04-24 DIAGNOSIS — J45909 Unspecified asthma, uncomplicated: Secondary | ICD-10-CM | POA: Diagnosis not present

## 2018-04-24 DIAGNOSIS — E1159 Type 2 diabetes mellitus with other circulatory complications: Secondary | ICD-10-CM | POA: Diagnosis not present

## 2018-04-24 DIAGNOSIS — M545 Low back pain: Secondary | ICD-10-CM | POA: Diagnosis not present

## 2018-04-24 DIAGNOSIS — E669 Obesity, unspecified: Secondary | ICD-10-CM | POA: Diagnosis not present

## 2018-04-24 DIAGNOSIS — I509 Heart failure, unspecified: Secondary | ICD-10-CM | POA: Diagnosis not present

## 2018-04-24 DIAGNOSIS — C50212 Malignant neoplasm of upper-inner quadrant of left female breast: Secondary | ICD-10-CM | POA: Diagnosis not present

## 2018-04-24 HISTORY — DX: Anemia, unspecified: D64.9

## 2018-04-24 HISTORY — DX: Malignant (primary) neoplasm, unspecified: C80.1

## 2018-04-24 NOTE — Patient Instructions (Signed)
INSTRUCTIONS FOR SURGERY     Your surgery is scheduled for:   Wednesday, April 8th   ARRIVE AT THE MEDICAL MALL AT 7:45 AM. THEY WILL SEND YOU TO THE REGISTRATION     AREA, THEN ONTO NUCLEAR MEDICINE.  THEN YOU WILL BE TAKEN TO SAME DAY SURGERY        REMEMBER: Instructions that are not followed completely may result in serious medical risk,  up to and including death, or upon the discretion of your surgeon and anesthesiologist,            your surgery may need to be rescheduled.  __X__ 1. Do not eat food after midnight the night before your procedure.                    No gum, candy, lozenger, tic tacs, tums or hard candies.                  ABSOLUTELY NOTHING SOLID IN YOUR MOUTH AFTER MIDNIGHT                    You may drink unlimited clear liquids up to 2 hours before you are scheduled to arrive for surgery.                   Do not drink anything within those 2 hours unless you need to take medicine, then take the                   smallest amount you need.  Clear liquids include:  water, apple juice without pulp,                   any flavor Gatorade, Black coffee, black tea.  Sugar may be added but no dairy/ honey /lemon.                        Broth and jello is not considered a clear liquid.  __x__  2. On the morning of surgery, please brush your teeth with toothpaste and water. You may rinse with                  mouthwash if you wish but DO NOT SWALLOW TOOTHPASTE OR MOUTHWASH  __X___3. NO alcohol for 24 hours before or after surgery.  __x___ 4.  Do NOT smoke or use e-cigarettes for 24 HOURS PRIOR TO SURGERY.                      DO NOT Use any chewable tobacco products for at least 6 hours prior to surgery.  __x___ 5. If you start any new medication after this appointment and prior to surgery, please                   Bring it with you on the day of surgery.  ___x__ 6. Notify your doctor if there is any change in  your medical condition, such as fever, infection, vomitting,                   Diarrhea or any open sores.  __x___ 7.  USE the CHG SOAP as instructed, the night before surgery and the day of surgery.                   Once you have washed with this soap, do NOT use any of the following: Powders, perfumes                    or lotions. Please do not wear make up, hairpins, clips or nail polish. You MAY NOT wear deodorant.                   Men may shave their face and neck.  Women need to shave 48 hours prior to surgery.                   DO NOT wear ANY jewelry on the day of surgery. If there are rings that are too tight to                    remove easily, please address this prior to the surgery day. Piercings need to be removed.                                                                     NO METAL ON YOUR BODY.                    Do NOT bring any valuables.  If you came to Pre-Admit testing then you will not need license,                     insurance card or credit card.  If you will be staying overnight, please either leave your things in                     the car or have your family be responsible for these items.                     [place] IS NOT RESPONSIBLE FOR BELONGINGS OR VALUABLES.  ___X__ 8. DO NOT wear contact lenses on surgery day.  You may not have dentures,                     Hearing aides, contacts or glasses in the operating room. These items can be                    Placed in the Recovery Room to receive immediately after surgery.  __x___ 9. IF YOU ARE SCHEDULED TO GO HOME ON THE SAME DAY, YOU MUST                   Have someone to drive you home and to stay with you  for the first 24 hours.                    Have an arrangement prior to arriving on surgery day.  ___x__ 10. Take the following medications on the morning of surgery with a sip of water:  1.ALBUTEROL (PLEASE BRING INHALER WITH YOU)                     2.NORVASC                      3.CETERIZINE                     4.LEVOTHYROXINE                     5.GABAPENTIN                     6.PREVACID  _X____ 11.  Follow any instructions provided to you by your surgeon.                        Such as enema, clear liquid bowel prep                    USE EMLA CREAM AS DIRECTED 1.5 HOURS BEFORE ARRIVING FOR                      FIRST PROCEDURE. [place]  __X__  12. STOP  ASPIRIN AS OF: 3 DAYS AGO                       THIS INCLUDES BC POWDERS / GOODIES POWDER  __x___ 13. STOP Anti-inflammatories as of: NOW                      This includes IBUPROFEN / MOTRIN / ADVIL / ALEVE/ NAPROXYN                    YOU MAY TAKE TYLENOL ANY TIME PRIOR TO SURGERY.  _____ 14.  Stop supplements until after surgery.                     This includes:CALCIUM CITRATE                 You may continue taking Vitamin B12 / Vitamin D3 but do not take on the morning of surgery.  _____ 15. Bring your CPAP machine into preop with you on the morning of surgery.  __X____16  Continue to take the following medications but do not take on the morning of surgery:                           LOSARTAN / HYDROCHLOROTHIAZIDE  ______18. If staying overnight, please have appropriate shoes to wear to be able to walk around the unit.                   Wear clean and comfortable clothing to the hospital.  [place].

## 2018-04-25 ENCOUNTER — Encounter: Payer: Self-pay | Admitting: Anesthesiology

## 2018-04-26 ENCOUNTER — Encounter
Admission: RE | Disposition: A | Discharge status: Home / Self Care | Payer: Self-pay | Source: Home / Self Care | Attending: General Surgery

## 2018-04-26 ENCOUNTER — Ambulatory Visit: Payer: PPO | Admitting: Anesthesiology

## 2018-04-26 ENCOUNTER — Ambulatory Visit: Payer: PPO

## 2018-04-26 ENCOUNTER — Encounter: Payer: Self-pay | Admitting: *Deleted

## 2018-04-26 ENCOUNTER — Encounter
Admission: RE | Admit: 2018-04-26 | Discharge: 2018-04-26 | Disposition: A | Discharge status: Home / Self Care | Payer: PPO | Source: Ambulatory Visit | Attending: General Surgery | Admitting: General Surgery

## 2018-04-26 ENCOUNTER — Ambulatory Visit
Admission: RE | Admit: 2018-04-26 | Discharge: 2018-04-26 | Disposition: A | Discharge status: Home / Self Care | Payer: PPO | Attending: General Surgery | Admitting: General Surgery

## 2018-04-26 ENCOUNTER — Other Ambulatory Visit: Payer: Self-pay

## 2018-04-26 DIAGNOSIS — R55 Syncope and collapse: Secondary | ICD-10-CM | POA: Diagnosis not present

## 2018-04-26 DIAGNOSIS — J45909 Unspecified asthma, uncomplicated: Secondary | ICD-10-CM | POA: Insufficient documentation

## 2018-04-26 DIAGNOSIS — C50912 Malignant neoplasm of unspecified site of left female breast: Secondary | ICD-10-CM | POA: Diagnosis not present

## 2018-04-26 DIAGNOSIS — C50212 Malignant neoplasm of upper-inner quadrant of left female breast: Secondary | ICD-10-CM | POA: Insufficient documentation

## 2018-04-26 DIAGNOSIS — Z79890 Hormone replacement therapy (postmenopausal): Secondary | ICD-10-CM | POA: Insufficient documentation

## 2018-04-26 DIAGNOSIS — N6322 Unspecified lump in the left breast, upper inner quadrant: Secondary | ICD-10-CM | POA: Diagnosis not present

## 2018-04-26 DIAGNOSIS — Z171 Estrogen receptor negative status [ER-]: Secondary | ICD-10-CM

## 2018-04-26 DIAGNOSIS — Z452 Encounter for adjustment and management of vascular access device: Secondary | ICD-10-CM | POA: Diagnosis not present

## 2018-04-26 DIAGNOSIS — Z95828 Presence of other vascular implants and grafts: Secondary | ICD-10-CM

## 2018-04-26 DIAGNOSIS — N6320 Unspecified lump in the left breast, unspecified quadrant: Secondary | ICD-10-CM

## 2018-04-26 DIAGNOSIS — E785 Hyperlipidemia, unspecified: Secondary | ICD-10-CM | POA: Insufficient documentation

## 2018-04-26 DIAGNOSIS — Z6841 Body Mass Index (BMI) 40.0 and over, adult: Secondary | ICD-10-CM | POA: Insufficient documentation

## 2018-04-26 DIAGNOSIS — Z79899 Other long term (current) drug therapy: Secondary | ICD-10-CM | POA: Insufficient documentation

## 2018-04-26 DIAGNOSIS — C50512 Malignant neoplasm of lower-outer quadrant of left female breast: Secondary | ICD-10-CM | POA: Diagnosis not present

## 2018-04-26 DIAGNOSIS — E669 Obesity, unspecified: Secondary | ICD-10-CM | POA: Insufficient documentation

## 2018-04-26 DIAGNOSIS — M25522 Pain in left elbow: Secondary | ICD-10-CM | POA: Diagnosis not present

## 2018-04-26 DIAGNOSIS — I10 Essential (primary) hypertension: Secondary | ICD-10-CM | POA: Insufficient documentation

## 2018-04-26 DIAGNOSIS — N3001 Acute cystitis with hematuria: Secondary | ICD-10-CM | POA: Diagnosis not present

## 2018-04-26 HISTORY — PX: BREAST LUMPECTOMY: SHX2

## 2018-04-26 HISTORY — PX: PORTACATH PLACEMENT: SHX2246

## 2018-04-26 SURGERY — BREAST LUMPECTOMY WITH EXCISION OF SENTINEL NODE
Anesthesia: General | Site: Chest | Laterality: Right

## 2018-04-26 MED ORDER — CEFAZOLIN SODIUM-DEXTROSE 2-4 GM/100ML-% IV SOLN
INTRAVENOUS | Status: AC
  Filled 2018-04-26: qty 100

## 2018-04-26 MED ORDER — TECHNETIUM TC 99M SULFUR COLLOID FILTERED
0.7000 | Freq: Once | INTRAVENOUS | Status: AC | PRN
  Administered 2018-04-26: 0.7 via INTRADERMAL

## 2018-04-26 MED ORDER — MIDAZOLAM HCL 2 MG/2ML IJ SOLN
INTRAMUSCULAR | Status: DC | PRN
  Administered 2018-04-26: 2 mg via INTRAVENOUS

## 2018-04-26 MED ORDER — METHYLENE BLUE 0.5 % INJ SOLN
INTRAVENOUS | Status: DC | PRN
  Administered 2018-04-26: 5 mL

## 2018-04-26 MED ORDER — FENTANYL CITRATE (PF) 100 MCG/2ML IJ SOLN
25.0000 ug | INTRAMUSCULAR | Status: DC | PRN
  Administered 2018-04-26 (×2): 25 ug via INTRAVENOUS

## 2018-04-26 MED ORDER — MIDAZOLAM HCL 2 MG/2ML IJ SOLN
INTRAMUSCULAR | Status: AC
  Filled 2018-04-26: qty 2

## 2018-04-26 MED ORDER — ONDANSETRON HCL 4 MG/2ML IJ SOLN
INTRAMUSCULAR | Status: DC | PRN
  Administered 2018-04-26: 4 mg via INTRAVENOUS

## 2018-04-26 MED ORDER — KETOROLAC TROMETHAMINE 30 MG/ML IJ SOLN
INTRAMUSCULAR | Status: AC
  Filled 2018-04-26: qty 1

## 2018-04-26 MED ORDER — LIDOCAINE HCL (CARDIAC) PF 100 MG/5ML IV SOSY
PREFILLED_SYRINGE | INTRAVENOUS | Status: DC | PRN
  Administered 2018-04-26: 60 mg via INTRAVENOUS

## 2018-04-26 MED ORDER — PHENYLEPHRINE HCL 10 MG/ML IJ SOLN
INTRAMUSCULAR | Status: DC | PRN
  Administered 2018-04-26: 100 ug via INTRAVENOUS

## 2018-04-26 MED ORDER — EPHEDRINE SULFATE 50 MG/ML IJ SOLN
INTRAMUSCULAR | Status: DC | PRN
  Administered 2018-04-26 (×2): 10 mg via INTRAVENOUS

## 2018-04-26 MED ORDER — FENTANYL CITRATE (PF) 100 MCG/2ML IJ SOLN
INTRAMUSCULAR | Status: DC | PRN
  Administered 2018-04-26: 25 ug via INTRAVENOUS
  Administered 2018-04-26: 50 ug via INTRAVENOUS
  Administered 2018-04-26 (×3): 25 ug via INTRAVENOUS

## 2018-04-26 MED ORDER — BUPIVACAINE-EPINEPHRINE (PF) 0.5% -1:200000 IJ SOLN
INTRAMUSCULAR | Status: DC | PRN
  Administered 2018-04-26: 7 mL
  Administered 2018-04-26: 20 mL
  Administered 2018-04-26: 3 mL

## 2018-04-26 MED ORDER — CEFAZOLIN SODIUM-DEXTROSE 2-4 GM/100ML-% IV SOLN
2.0000 g | INTRAVENOUS | Status: AC
  Administered 2018-04-26: 2 g via INTRAVENOUS

## 2018-04-26 MED ORDER — ROCURONIUM BROMIDE 50 MG/5ML IV SOLN
INTRAVENOUS | Status: AC
  Filled 2018-04-26: qty 1

## 2018-04-26 MED ORDER — FENTANYL CITRATE (PF) 100 MCG/2ML IJ SOLN
INTRAMUSCULAR | Status: AC
  Filled 2018-04-26: qty 2

## 2018-04-26 MED ORDER — ACETAMINOPHEN 10 MG/ML IV SOLN
INTRAVENOUS | Status: AC
  Filled 2018-04-26: qty 100

## 2018-04-26 MED ORDER — SODIUM CHLORIDE (PF) 0.9 % IJ SOLN
INTRAMUSCULAR | Status: DC | PRN
  Administered 2018-04-26: 10 mL via INTRAVENOUS

## 2018-04-26 MED ORDER — PROPOFOL 10 MG/ML IV BOLUS
INTRAVENOUS | Status: DC | PRN
  Administered 2018-04-26: 160 mg via INTRAVENOUS
  Administered 2018-04-26: 40 mg via INTRAVENOUS

## 2018-04-26 MED ORDER — ONDANSETRON HCL 4 MG/2ML IJ SOLN: 4.0000 mg | Freq: Once | INTRAMUSCULAR | Status: DC | PRN

## 2018-04-26 MED ORDER — GLYCOPYRROLATE 0.2 MG/ML IJ SOLN
INTRAMUSCULAR | Status: DC | PRN
  Administered 2018-04-26: .2 mg via INTRAVENOUS

## 2018-04-26 MED ORDER — LACTATED RINGERS IV SOLN
INTRAVENOUS | Status: DC
  Administered 2018-04-26 (×2): via INTRAVENOUS

## 2018-04-26 MED ORDER — FENTANYL CITRATE (PF) 100 MCG/2ML IJ SOLN
INTRAMUSCULAR | Status: AC
  Administered 2018-04-26: 25 ug via INTRAVENOUS
  Filled 2018-04-26: qty 2

## 2018-04-26 MED ORDER — PROPOFOL 10 MG/ML IV BOLUS
INTRAVENOUS | Status: AC
  Filled 2018-04-26: qty 20

## 2018-04-26 MED ORDER — LIDOCAINE HCL (PF) 2 % IJ SOLN
INTRAMUSCULAR | Status: AC
  Filled 2018-04-26: qty 10

## 2018-04-26 MED ORDER — ACETAMINOPHEN 10 MG/ML IV SOLN
INTRAVENOUS | Status: DC | PRN
  Administered 2018-04-26: 1000 mg via INTRAVENOUS

## 2018-04-26 MED ORDER — KETOROLAC TROMETHAMINE 30 MG/ML IJ SOLN
INTRAMUSCULAR | Status: DC | PRN
  Administered 2018-04-26: 30 mg via INTRAVENOUS

## 2018-04-26 MED ORDER — DEXAMETHASONE SODIUM PHOSPHATE 10 MG/ML IJ SOLN
INTRAMUSCULAR | Status: DC | PRN
  Administered 2018-04-26: 10 mg via INTRAVENOUS

## 2018-04-26 SURGICAL SUPPLY — 70 items
BINDER BREAST LRG (GAUZE/BANDAGES/DRESSINGS) IMPLANT
BINDER BREAST MEDIUM (GAUZE/BANDAGES/DRESSINGS) IMPLANT
BINDER BREAST XLRG (GAUZE/BANDAGES/DRESSINGS) ×1 IMPLANT
BINDER BREAST XXLRG (GAUZE/BANDAGES/DRESSINGS) IMPLANT
BLADE SURG 11 STRL SS SAFETY (MISCELLANEOUS) ×1 IMPLANT
BLADE SURG 15 STRL SS SAFETY (BLADE) ×6 IMPLANT
BULB RESERV EVAC DRAIN JP 100C (MISCELLANEOUS) IMPLANT
CANISTER SUCT 1200ML W/VALVE (MISCELLANEOUS) ×3 IMPLANT
CHLORAPREP W/TINT 26 (MISCELLANEOUS) ×3 IMPLANT
CLIP VESOCCLUDE MED 6/CT (CLIP) ×1 IMPLANT
CNTNR SPEC 2.5X3XGRAD LEK (MISCELLANEOUS)
CONT SPEC 4OZ STER OR WHT (MISCELLANEOUS)
CONTAINER SPEC 2.5X3XGRAD LEK (MISCELLANEOUS) IMPLANT
COVER LIGHT HANDLE STERIS (MISCELLANEOUS) ×6 IMPLANT
COVER PROBE FLX POLY STRL (MISCELLANEOUS) ×4 IMPLANT
COVER WAND RF STERILE (DRAPES) ×3 IMPLANT
DECANTER SPIKE VIAL GLASS SM (MISCELLANEOUS) ×6 IMPLANT
DEVICE DUBIN SPECIMEN MAMMOGRA (MISCELLANEOUS) ×3 IMPLANT
DRAIN CHANNEL JP 15F RND 16 (MISCELLANEOUS) IMPLANT
DRAPE C-ARM XRAY 36X54 (DRAPES) ×3 IMPLANT
DRAPE LAPAROTOMY TRNSV 106X77 (MISCELLANEOUS) ×3 IMPLANT
DRSG GAUZE FLUFF 36X18 (GAUZE/BANDAGES/DRESSINGS) ×6 IMPLANT
DRSG TEGADERM 2-3/8X2-3/4 SM (GAUZE/BANDAGES/DRESSINGS) ×3 IMPLANT
DRSG TEGADERM 4X4.75 (GAUZE/BANDAGES/DRESSINGS) ×3 IMPLANT
DRSG TELFA 3X8 NADH (GAUZE/BANDAGES/DRESSINGS) ×3 IMPLANT
DRSG TELFA 4X3 1S NADH ST (GAUZE/BANDAGES/DRESSINGS) ×3 IMPLANT
ELECT CAUTERY BLADE TIP 2.5 (TIP) ×3
ELECT REM PT RETURN 9FT ADLT (ELECTROSURGICAL) ×3
ELECTRODE CAUTERY BLDE TIP 2.5 (TIP) ×2 IMPLANT
ELECTRODE REM PT RTRN 9FT ADLT (ELECTROSURGICAL) ×2 IMPLANT
GAUZE SPONGE 4X4 12PLY STRL (GAUZE/BANDAGES/DRESSINGS) ×3 IMPLANT
GLOVE BIO SURGEON STRL SZ7.5 (GLOVE) ×5 IMPLANT
GLOVE INDICATOR 8.0 STRL GRN (GLOVE) ×5 IMPLANT
GOWN STRL REUS W/ TWL LRG LVL3 (GOWN DISPOSABLE) ×4 IMPLANT
GOWN STRL REUS W/TWL LRG LVL3 (GOWN DISPOSABLE) ×3
KIT PORT POWER 8FR ISP CVUE (Port) ×3 IMPLANT
KIT TURNOVER KIT A (KITS) ×3 IMPLANT
LABEL OR SOLS (LABEL) ×3 IMPLANT
MARGIN MAP 10MM (MISCELLANEOUS) ×3 IMPLANT
NDL HYPO 25X1 1.5 SAFETY (NEEDLE) ×4 IMPLANT
NEEDLE HYPO 22GX1.5 SAFETY (NEEDLE) ×3 IMPLANT
NEEDLE HYPO 25X1 1.5 SAFETY (NEEDLE) ×6 IMPLANT
NS IRRIG 500ML POUR BTL (IV SOLUTION) ×3 IMPLANT
PACK BASIN MINOR ARMC (MISCELLANEOUS) ×3 IMPLANT
PACK PORT-A-CATH (MISCELLANEOUS) ×3 IMPLANT
PAD DRESSING TELFA 3X8 NADH (GAUZE/BANDAGES/DRESSINGS) ×2 IMPLANT
RETRACTOR RING XSMALL (MISCELLANEOUS) ×2 IMPLANT
RTRCTR WOUND ALEXIS 13CM XS SH (MISCELLANEOUS) ×3
SHEARS FOC LG CVD HARMONIC 17C (MISCELLANEOUS) IMPLANT
SHEARS HARMONIC 9CM CVD (BLADE) ×3 IMPLANT
SLEEVE PROTECTION STRL DISP (MISCELLANEOUS) ×1 IMPLANT
SLEVE PROBE SENORX GAMMA FIND (MISCELLANEOUS) ×3 IMPLANT
STRIP CLOSURE SKIN 1/2X4 (GAUZE/BANDAGES/DRESSINGS) ×3 IMPLANT
SUT ETHILON 3-0 FS-10 30 BLK (SUTURE) ×3
SUT PROLENE 3 0 SH DA (SUTURE) ×3 IMPLANT
SUT SILK 2 0 (SUTURE) ×1
SUT SILK 2-0 18XBRD TIE 12 (SUTURE) ×2 IMPLANT
SUT VIC AB 2-0 CT1 27 (SUTURE) ×5
SUT VIC AB 2-0 CT1 TAPERPNT 27 (SUTURE) ×6 IMPLANT
SUT VIC AB 3-0 SH 27 (SUTURE) ×2
SUT VIC AB 3-0 SH 27X BRD (SUTURE) ×4 IMPLANT
SUT VIC AB 4-0 FS2 27 (SUTURE) ×7 IMPLANT
SUT VICRYL+ 3-0 144IN (SUTURE) ×3 IMPLANT
SUTURE EHLN 3-0 FS-10 30 BLK (SUTURE) ×2 IMPLANT
SWABSTK COMLB BENZOIN TINCTURE (MISCELLANEOUS) ×3 IMPLANT
SYR 10ML LL (SYRINGE) ×3 IMPLANT
SYR 10ML SLIP (SYRINGE) ×3 IMPLANT
SYR BULB IRRIG 60ML STRL (SYRINGE) ×3 IMPLANT
TAPE TRANSPORE STRL 2 31045 (GAUZE/BANDAGES/DRESSINGS) ×4 IMPLANT
WATER STERILE IRR 1000ML POUR (IV SOLUTION) ×3 IMPLANT

## 2018-04-26 NOTE — Transfer of Care (Signed)
Immediate Anesthesia Transfer of Care Note  Patient: Megan Harris  Procedure(s) Performed: BREAST LUMPECTOMY WITH EXCISION OF SENTINEL NODE (Left Breast) INSERTION PORT-A-CATH RIGHT (Right Chest)  Patient Location: PACU  Anesthesia Type:General  Level of Consciousness: awake, alert  and oriented  Airway & Oxygen Therapy: Patient Spontanous Breathing and Patient connected to face mask oxygen  Post-op Assessment: Report given to RN and Post -op Vital signs reviewed and stable  Post vital signs: Reviewed and stable  Last Vitals:  Vitals Value Taken Time  BP 129/69 04/26/2018 11:59 AM  Temp 36.6 C 04/26/2018 11:59 AM  Pulse 92 04/26/2018 11:59 AM  Resp 17 04/26/2018 11:59 AM  SpO2 98 % 04/26/2018 11:59 AM    Last Pain:  Vitals:   04/26/18 0824  TempSrc: Temporal  PainSc: 0-No pain         Complications: No apparent anesthesia complications

## 2018-04-26 NOTE — Op Note (Addendum)
Preoperative diagnosis: Triple negative left breast cancer.  Postoperative diagnosis: Same.  Operative procedure: 1) wide excision left upper inner quadrant cancer with sentinel node biopsy; 2) right subclavian PowerPort placement with ultrasound and fluoroscopic guidance.  Operating Surgeon: Hervey Ard, MD.  Anesthesia: General by LMA.  Marcaine 0.5% with 1 to 200,000 units epinephrine, 30 cc.  Estimated blood loss: 50 cc.  Clinical note: This 68 year old woman was recently identified with a new abnormality in her left mammogram and subsequent ultrasound-guided biopsy showed evidence of invasive mammary carcinoma.  She is triple negative.  She would have been a candidate for neoadjuvant chemotherapy but due to the [place] virus pandemic it was elected to proceed to surgery.The patient underwent injection with technetium sulfur colloid before presentation of the operating theater.  The patient received Kefzol prior to placement of the right PowerPort.  SCD stockings for DVT prevention.  Operative note: After the induction of general anesthesia 5 cc of 0.5% methylene blue was injected in the subareolar plexus on the left side.  The breast chest and neck were then prepped with ChloraPrep and draped.  Ultrasound was used to identify the lesion in the 11 o'clock position of the left breast approximately 10 cm from the nipple.  This was within 8 mm of the overlying skin and it was elected to resect an ellipse of skin with this.  The eventual length was approximately 10 cm.  Local anesthetic was infiltrated.  The skin was incised sharply and remaining dissection completed with electrocautery.  There had been evidence of a hematoma post biopsy and the superior edge showed thickening.  It was unclear whether this was tumor or inflammatory process from the biopsy.  Subsequent review by pathology suggested the tumor was within 1 mm.  Specimen radiograph did confirm the mass and the clip.  While the  breast was being processed attention was turned to the axilla.  Counts were fairly low at about 60-70.  Local anesthetic was infiltrated and a transverse incision made in the lower aspect of the axilla.  The skin was incised sharply and the axillary envelope exposed.  An extra small Alexis wound protector was placed and after opening the axillary envelope a single hot, blue node with counts of about 280 was identified.  This was sent in formalin for routine histology.  Palpation and scanning through the axilla with the node seeker showed no other additional suspicious nodes or palpable nodes.  Good hemostasis was noted.  The axillary envelope was closed in layers with 2-0 Vicryl figure-of-eight sutures the axillary envelope and then to the subcutaneous fat.  The skin was closed with a running 4-0 Vicryl subcuticular suture.  After receiving the pathology report it was elected to resect an additional 1 cm from the superior anterior border.  The Baron's mastectomy retractor was used to provide a straight plane and sharp dissection was undertaken.  The specimen was orientated with the new superior border on the Telfa pad.  This was placed in formalin for routine histology. Medium Weck clips were placed around the wide excision site for post-operative radiation therapy.  After achieving good hemostasis the wound was closed in layers with interrupted 2-0 Vicryl figure-of-eight sutures.  The skin was closed with a running 4-0 Vicryl subcuticular suture.  Attention was turned towards placement of a PowerPort for planned adjuvant chemotherapy.  The ultrasound was used to confirm patency of the right subclavian vein.  This was then cannulated under ultrasound guidance on a single stick.  The guidewire  was advanced without difficulty.  The dilator was placed and the catheter advanced into the SVC.  This was then tunneled to a pocket on the right anterior chest.  The port was anchored to the deep tissue with 3-0 Prolene  sutures x2.  The port easily irrigated and aspirated.  The deep tissue was approximated with a running 3-0 Vicryl suture and the skin closed with a running 4-0 Vicryl subcuticular suture.  There was noted to be about a 3-4 cm hematoma at the puncture site.  This appeared to be venous bleeding.  Skin defect was closed with a single 4-0 Vicryl subcuticular suture.  A pressure dressing will be applied in the recovery room.  Benzoin Steri-Strips were applied to the wound followed by Telfa and Tegaderm on the power port sites and Telfa and fluff gauze with a compressive wrap on the wide excision site.  Erect portable chest x-ray in recovery room showed the catheter tip to be in the midportion of the SVC.  There is no evidence of pneumothorax.  The patient tolerated the procedure well.

## 2018-04-26 NOTE — Progress Notes (Signed)
Ch was pg regarding finalizing an AD for pt in pre-op. Ch communicated to pt that due to restrictions of visitors, the AD could not be finalized w/o witnesses. Pt shared that she would like her husband to be contacted if decisions needed to be made on her behalf. This was stated in front of the ch and the nurse at bedside. Ch thanked pt for her understanding and shared that she would f/u with the pt's husband to share what was discussed. Ch spoke w/ both husband and daughter via telephone. Husband and dgt were given an update on the pt's status and also that the AD could be finalized at the pt's bank for future use. Both understood and were thankful for the f/u. No further needs at this time.    04/26/18 0900  Clinical Encounter Type  Visited With Patient;Health care provider;Patient and family together  Visit Type Psychological support;Spiritual support;Social support;Pre-op  Referral From Nurse  Consult/Referral To Chaplain  Spiritual Encounters  Spiritual Needs Emotional;Grief support (AD edu )  Stress Factors  Patient Stress Factors Health changes;Major life changes  Family Stress Factors Major life changes  Advance Directives (For Healthcare)  Does Patient Have a Medical Advance Directive? No  Would patient like information on creating a medical advance directive? Yes (Inpatient - patient defers creating a medical advance directive at this time - Information given)

## 2018-04-26 NOTE — Discharge Instructions (Signed)

## 2018-04-26 NOTE — Anesthesia Procedure Notes (Signed)
Procedure Name: LMA Insertion Date/Time: 04/26/2018 10:15 AM Performed by: Allean Found, CRNA Pre-anesthesia Checklist: Patient identified, Patient being monitored, Timeout performed, Emergency Drugs available and Suction available Patient Re-evaluated:Patient Re-evaluated prior to induction Oxygen Delivery Method: Circle system utilized Preoxygenation: Pre-oxygenation with 100% oxygen Induction Type: IV induction Ventilation: Mask ventilation without difficulty LMA: LMA inserted Tube type: Oral Number of attempts: 1 Placement Confirmation: positive ETCO2 and breath sounds checked- equal and bilateral Tube secured with: Tape Dental Injury: Teeth and Oropharynx as per pre-operative assessment

## 2018-04-26 NOTE — Anesthesia Preprocedure Evaluation (Signed)
Anesthesia Evaluation  Patient identified by MRN, date of birth, ID band Patient awake    Reviewed: Allergy & Precautions, NPO status , Patient's Chart, lab work & pertinent test results, reviewed documented beta blocker date and time   History of Anesthesia Complications (+) PONV and history of anesthetic complications  Airway Mallampati: III  TM Distance: >3 FB     Dental  (+) Chipped   Pulmonary asthma , pneumonia, resolved,           Cardiovascular hypertension, Pt. on medications      Neuro/Psych  Neuromuscular disease    GI/Hepatic GERD  Controlled,  Endo/Other    Renal/GU      Musculoskeletal  (+) Arthritis ,   Abdominal   Peds  Hematology  (+) anemia ,   Anesthesia Other Findings Obese. EKG ok.  Reproductive/Obstetrics                             Anesthesia Physical Anesthesia Plan  ASA: II  Anesthesia Plan: General   Post-op Pain Management:    Induction: Intravenous  PONV Risk Score and Plan:   Airway Management Planned: LMA  Additional Equipment:   Intra-op Plan:   Post-operative Plan:   Informed Consent: I have reviewed the patients History and Physical, chart, labs and discussed the procedure including the risks, benefits and alternatives for the proposed anesthesia with the patient or authorized representative who has indicated his/her understanding and acceptance.       Plan Discussed with: CRNA  Anesthesia Plan Comments:         Anesthesia Quick Evaluation

## 2018-04-26 NOTE — H&P (Signed)
No change in clinical condition or exam. For left wide excision, SLN biopsy; right power port placement.

## 2018-04-26 NOTE — Anesthesia Postprocedure Evaluation (Signed)
Anesthesia Post Note  Patient: Megan Harris  Procedure(s) Performed: BREAST LUMPECTOMY WITH EXCISION OF SENTINEL NODE (Left Breast) INSERTION PORT-A-CATH RIGHT (Right Chest)  Patient location during evaluation: PACU Anesthesia Type: General Level of consciousness: awake and alert Pain management: pain level controlled Vital Signs Assessment: post-procedure vital signs reviewed and stable Respiratory status: spontaneous breathing, nonlabored ventilation, respiratory function stable and patient connected to nasal cannula oxygen Cardiovascular status: blood pressure returned to baseline and stable Postop Assessment: no apparent nausea or vomiting Anesthetic complications: no     Last Vitals:  Vitals:   04/26/18 1244 04/26/18 1254  BP: 135/84 125/88  Pulse: 96 96  Resp: 13 14  Temp:  37 C  SpO2: 96% 95%    Last Pain:  Vitals:   04/26/18 1254  TempSrc:   PainSc: 3                  Abigael Mogle S

## 2018-04-26 NOTE — Anesthesia Post-op Follow-up Note (Signed)
Anesthesia QCDR form completed.        

## 2018-04-27 ENCOUNTER — Telehealth: Payer: Self-pay | Admitting: General Surgery

## 2018-04-27 ENCOUNTER — Encounter: Payer: Self-pay | Admitting: General Surgery

## 2018-04-27 NOTE — Telephone Encounter (Signed)
The patient was notified that her pathology results had been received.  54mm tumor, T1c, N0.  Negative margins (negative on original sample, additional superior margin negative as well.  The patient reports that the swelling at the [place] insertion site has resolved and is asymptomatic.  The family had some questions about the identification of cardiomegaly and vascular congestion on the chest x-ray report from yesterday.  I explained to them that this was after having been asleep for several hours, a poor deep breath and several liters of IV fluids makes for a poor picture quality but that the reason for the study to confirm the port placement site and the absence of pneumothorax was as expected.  We will plan for follow-up in the office on April 14 as scheduled.

## 2018-04-28 ENCOUNTER — Inpatient Hospital Stay (HOSPITAL_BASED_OUTPATIENT_CLINIC_OR_DEPARTMENT_OTHER): Payer: PPO | Admitting: Oncology

## 2018-04-28 ENCOUNTER — Other Ambulatory Visit: Payer: Self-pay

## 2018-04-28 DIAGNOSIS — C50919 Malignant neoplasm of unspecified site of unspecified female breast: Secondary | ICD-10-CM

## 2018-04-28 DIAGNOSIS — Z170 Estrogen receptor positive status [ER+]: Secondary | ICD-10-CM

## 2018-04-28 DIAGNOSIS — Z79899 Other long term (current) drug therapy: Secondary | ICD-10-CM | POA: Diagnosis not present

## 2018-04-28 DIAGNOSIS — Z8041 Family history of malignant neoplasm of ovary: Secondary | ICD-10-CM | POA: Diagnosis not present

## 2018-04-28 DIAGNOSIS — Z7189 Other specified counseling: Secondary | ICD-10-CM

## 2018-04-28 DIAGNOSIS — Z7982 Long term (current) use of aspirin: Secondary | ICD-10-CM

## 2018-04-28 DIAGNOSIS — Z803 Family history of malignant neoplasm of breast: Secondary | ICD-10-CM | POA: Diagnosis not present

## 2018-04-28 DIAGNOSIS — Z809 Family history of malignant neoplasm, unspecified: Secondary | ICD-10-CM

## 2018-04-28 NOTE — Progress Notes (Signed)
  Oncology Nurse Navigator Documentation  Navigator Location: [facility] (04/28/18 1200)   )Navigator Encounter Type: Telephone (04/28/18 1200) Telephone: Lahoma Crocker Call;Appt Confirmation/Clarification (04/28/18 1200)                   Patient Visit Type: Follow-up;Post-op Appt (04/28/18 1200)             Education Method: Teach-back;Verbal (04/28/18 1200)                Time Spent with Patient: 30 (04/28/18 1200)   Spoke to patient daughter post-op.  Doing well with minimal discomfort.

## 2018-04-28 NOTE — Progress Notes (Signed)
Called pt for WebEx meeting.  Patient states no new concerns today

## 2018-04-29 ENCOUNTER — Encounter: Payer: Self-pay | Admitting: Oncology

## 2018-04-29 DIAGNOSIS — C50919 Malignant neoplasm of unspecified site of unspecified female breast: Secondary | ICD-10-CM | POA: Insufficient documentation

## 2018-04-29 MED ORDER — PROCHLORPERAZINE MALEATE 10 MG PO TABS: 10.0000 mg | ORAL_TABLET | Freq: Four times a day (QID) | ORAL | 1 refills | Status: DC | PRN

## 2018-04-29 MED ORDER — LIDOCAINE-PRILOCAINE 2.5-2.5 % EX CREA: TOPICAL_CREAM | CUTANEOUS | 3 refills | Status: DC

## 2018-04-29 MED ORDER — ONDANSETRON HCL 8 MG PO TABS: 8.0000 mg | ORAL_TABLET | Freq: Two times a day (BID) | ORAL | 1 refills | Status: DC | PRN

## 2018-04-29 NOTE — Progress Notes (Signed)
START ON PATHWAY REGIMEN - Breast   Dose-Dense AC q14 days:   A cycle is every 14 days:     Doxorubicin      Cyclophosphamide      Pegfilgrastim-[id number]   **Always confirm dose/schedule in your pharmacy ordering system**  Paclitaxel 80 mg/m2 Weekly:   Administer weekly:     Paclitaxel   **Always confirm dose/schedule in your pharmacy ordering system**  Patient Characteristics: Postoperative without Neoadjuvant Therapy (Pathologic Staging), Invasive Disease, Adjuvant Therapy, HER2 Negative/Unknown/Equivocal, ER Negative/Unknown, Node Negative, pT1a-c, pN0/N1mi or pT2 or Higher, pN0 Therapeutic Status: Postoperative without Neoadjuvant Therapy (Pathologic Staging) AJCC Grade: G3 AJCC N Category: pN0 AJCC M Category: cM0 ER Status: Negative (-) AJCC 8 Stage Grouping: IB HER2 Status: Negative (-) Oncotype Dx Recurrence Score: Not Appropriate AJCC T Category: pT1c PR Status: Negative (-) Intent of Therapy: Curative Intent, Discussed with Patient 

## 2018-04-29 NOTE — Progress Notes (Signed)
HEMATOLOGY-ONCOLOGY TeleHEALTH VISIT PROGRESS NOTE  I connected with Megan Harris on 04/29/18 at 12:30 PM EDT by video enabled telemedicine visit and verified that I am speaking with the correct person using two identifiers. I discussed the limitations, risks, security and privacy concerns of performing an evaluation and management service by telemedicine and the availability of in-person appointments. I also discussed with the patient that there may be a patient responsible charge related to this service. The patient expressed understanding and agreed to proceed.   Other persons participating in the visit and their role in the encounter:  Geraldine Solar, CMA, check in patient   Patient's daughter  Patient's location: Home  Provider's location: work  Risk analyst Complaint: discussion of pathology report and chemotherapy plan.     INTERVAL HISTORY Megan Harris is a 68 y.o. female who has above history reviewed by me today presents for follow up visit for discussion of pathology report and management of triple negative breast cancer.  She underwent lumpectomy and sentinel lymph node biopsy and medi port placement.  Has follow up appointment with Dr.Byrnett next week and me next week to discuss. Patient feels anxious and called and want to move her appt up.   She reports minimal soreness at the surgical site. No fever, chills.  No new complaint. Feels anxious.   Review of Systems  Constitutional: Negative for appetite change, chills, fatigue and fever.  HENT:   Negative for hearing loss and voice change.   Eyes: Negative for eye problems.  Respiratory: Negative for chest tightness and cough.   Cardiovascular: Negative for chest pain.  Gastrointestinal: Negative for abdominal distention, abdominal pain and blood in stool.  Endocrine: Negative for hot flashes.  Genitourinary: Negative for difficulty urinating and frequency.   Musculoskeletal: Negative for arthralgias.  Skin: Negative for  itching and rash.  Neurological: Negative for extremity weakness.  Hematological: Negative for adenopathy.  Psychiatric/Behavioral: Negative for confusion.    Past Medical History:  Diagnosis Date  . Abdominal pain, left lower quadrant 2012  . Anemia    vitamin d deficiency  . Arthritis   . Asthma   . Back pain 2005   lower back arthritis  . Breast screening, unspecified 2012  . Cancer ([facility]) 04/2018   left breast  . Chronic cholecystitis 2012   has had choleycystectomy.  not an issue  . Constipation 2005  . Endocrine problem 2005  . GERD (gastroesophageal reflux disease) 2012  . Hyperlipidemia 2012  . Hypertension 2002  . Nausea with vomiting 2012  . Obesity, unspecified 2012  . Pneumonia   . PONV (postoperative nausea and vomiting)   . Special screening for malignant neoplasms, colon 2012   Past Surgical History:  Procedure Laterality Date  . BILATERAL CARPAL TUNNEL RELEASE Bilateral   . BREAST BIOPSY Left 04/13/2018   Pending Path  . BREAST EXCISIONAL BIOPSY Left early 90s   neg  . BREAST LUMPECTOMY Left 04/26/2018   Procedure: BREAST LUMPECTOMY WITH EXCISION OF SENTINEL NODE;  Surgeon: Robert Bellow, MD;  Location: [facility] ORS;  Service: General;  Laterality: Left;  . BUNIONECTOMY Bilateral 2003  . CHOLECYSTECTOMY  06/04/2010  . COLON RESECTION  2004   due diverticulitis   . COLONOSCOPY  2005   [place], Dr. Bary Castilla  . COLONOSCOPY  06/28/2012  . COLOSTOMY  2004  . COLOSTOMY REVERSAL  2004  . FOOT SURGERY Bilateral 2012   plantar faciatis  . HAND SURGERY Bilateral    carpal tunnel  . HERNIA  REPAIR  2005   at colostomy site after reversal done  . JOINT REPLACEMENT    . KNEE ARTHROPLASTY Right 04/20/2017   Procedure: COMPUTER ASSISTED TOTAL KNEE ARTHROPLASTY;  Surgeon: Dereck Leep, MD;  Location: [facility] ORS;  Service: Orthopedics;  Laterality: Right;  . KNEE ARTHROSCOPY Right 02/03/2015   Procedure: ARTHROSCOPY right knee, partial medial menisectomy, condyle  malleolus, patella and femoral;  Surgeon: Dereck Leep, MD;  Location: [facility] ORS;  Service: Orthopedics;  Laterality: Right;  . NASAL SINUS SURGERY     2005  . PORTACATH PLACEMENT Right 04/26/2018   Procedure: INSERTION PORT-A-CATH RIGHT;  Surgeon: Robert Bellow, MD;  Location: [facility] ORS;  Service: General;  Laterality: Right;  . SALPINGOOPHORECTOMY  1998  . TONSILLECTOMY    . TUBAL LIGATION  1978  . UPPER GI ENDOSCOPY  06/28/2012  . VAGINAL HYSTERECTOMY  1987    Family History  Problem Relation Age of Onset  . Heart disease Father   . Diabetes Paternal Grandmother   . Breast cancer Paternal Grandmother 87  . Diabetes Paternal Grandfather   . Brain cancer Mother 47  . Bone cancer Sister 38  . Colon cancer Maternal Uncle   . Ovarian cancer Neg Hx     Social History   Socioeconomic History  . Marital status: Married    Spouse name: Francee Piccolo  . Number of children: 2  . Years of education: Not on file  . Highest education level: Not on file  Occupational History  . 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 Financial resource strain: Not on file  . Food insecurity:    Worry: Not on file    Inability: Not on file  . Transportation needs:    Medical: Not on file    Non-medical: Not on file  Tobacco Use  . Smoking status: Never Smoker  . Smokeless tobacco: Never Used  Substance and Sexual Activity  . Alcohol use: Yes    Comment: socially  . Drug use: No  . Sexual activity: Yes    Birth control/protection: Surgical  Lifestyle  . Physical activity:    Days per week: Not on file    Minutes per session: Not on file  . Stress: Not on file  Relationships  . Social connections:    Talks on phone: Not on file    Gets together: Not on file    Attends religious service: Not on file    Active member of club or organization: Not on file    Attends meetings of clubs or organizations: Not on file    Relationship status: Not on file  . Intimate  partner violence:    Fear of current or ex partner: Not on file    Emotionally abused: Not on file    Physically abused: Not on file    Forced sexual activity: Not on file  Other Topics Concern  . Not on file  Social History Narrative  . Not on file    Current Outpatient Medications on File Prior to Visit  Medication Sig Dispense Refill  . acetaminophen (TYLENOL) 500 MG tablet Take 500 mg by mouth every 6 (six) hours as needed.    Marland Kitchen albuterol (PROVENTIL HFA;VENTOLIN HFA) 108 (90 Base) MCG/ACT inhaler Inhale 2 puffs into the lungs every 6 (six) hours as needed for wheezing or shortness of breath.     Marland Kitchen amLODipine (NORVASC) 5 MG tablet Take 5 mg by mouth  daily.    . aspirin EC 81 MG tablet Take 81 mg by mouth daily.    Marland Kitchen atorvastatin (LIPITOR) 10 MG tablet Take 10 mg by mouth daily.    . cetirizine (ZYRTEC) 10 MG tablet Take 10 mg by mouth daily.    . Cholecalciferol (VITAMIN D3 MAXIMUM STRENGTH) 5000 units capsule Take 5,000 Units by mouth daily.    Marland Kitchen dextromethorphan-guaiFENesin (MUCINEX DM) 30-600 MG 12hr tablet Take 1 tablet by mouth daily as needed for cough.    . gabapentin (NEURONTIN) 300 MG capsule Take 300 mg by mouth 3 (three) times daily.    . hydrochlorothiazide (HYDRODIURIL) 25 MG tablet Take 25 mg by mouth daily.    . lansoprazole (PREVACID) 30 MG capsule Take 30 mg by mouth 2 (two) times daily.     Marland Kitchen levothyroxine (SYNTHROID, LEVOTHROID) 50 MCG tablet Take 50 mcg by mouth daily before breakfast.     . losartan (COZAAR) 100 MG tablet Take 100 mg by mouth daily.    . montelukast (SINGULAIR) 10 MG tablet Take 10 mg by mouth at bedtime.    Marland Kitchen zolpidem (AMBIEN) 10 MG tablet Take 10 mg by mouth at bedtime.      No current facility-administered medications on file prior to visit.     Allergies  Allergen Reactions  . Codeine Other (See Comments)    Goes to sleep and unable to wake up.  Marland Kitchen Penicillins Hives    Did it involve swelling of the face/tongue/throat, SOB, or low BP?  No Did it involve sudden or severe rash/hives, skin peeling, or any reaction on the inside of your mouth or nose? No Did you need to seek medical attention at a hospital or doctor's office? Yes When did it last happen?50s If all above answers are "NO", may proceed with cephalosporin use.        Observations/Objective: Today's Vitals   04/28/18 1226  PainSc: 0-No pain   There is no height or weight on file to calculate BMI.   Physical Exam  Constitutional: She is oriented to person, place, and time and well-developed, well-nourished, and in no distress. No distress.  HENT:  Head: Normocephalic.  Eyes: Pupils are equal, round, and reactive to light.  Neck: Normal range of motion.  Pulmonary/Chest: Effort normal. No respiratory distress.  Neurological: She is alert and oriented to person, place, and time.  Psychiatric: Affect normal.    CBC    Component Value Date/Time   WBC 5.4 04/13/2017 1358   RBC 4.83 04/13/2017 1358   HGB 14.4 04/13/2017 1358   HCT 42.5 04/13/2017 1358   PLT 199 04/13/2017 1358   MCV 88.1 04/13/2017 1358   MCH 29.8 04/13/2017 1358   MCHC 33.8 04/13/2017 1358   RDW 14.2 04/13/2017 1358    CMP     Component Value Date/Time   NA 138 04/13/2017 1358   K 3.3 (L) 04/13/2017 1358   CL 101 04/13/2017 1358   CO2 25 04/13/2017 1358   GLUCOSE 123 (H) 04/13/2017 1358   BUN 16 04/13/2017 1358   CREATININE 0.80 04/13/2017 1358   CALCIUM 9.0 04/13/2017 1358   PROT 7.1 04/13/2017 1358   ALBUMIN 4.0 04/13/2017 1358   AST 41 04/13/2017 1358   ALT 48 04/13/2017 1358   ALKPHOS 64 04/13/2017 1358   BILITOT 0.8 04/13/2017 1358   GFRNONAA >60 04/13/2017 1358   GFRAA >60 04/13/2017 1358     Assessment and Plan: 1. 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 ([place])  2. Goals of care, counseling/discussion    Cancer Staging 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 [facility]) Staging form: Breast, AJCC 8th Edition - Clinical stage from 04/28/2018: Stage  IB (cT1c, cN0, cM0, G3, ER-, PR-, HER2-) - Signed by Earlie Server, MD on 04/29/2018  The diagnosis of Stage IB Triple negative left breast cancer and care plan were discussed with patient in detail.  NCCN guidelines were reviewed and shared with patient.   I recommend adjuvant chemotherapy with ddAC fo 4 cycles followed by weekly Taxol x 12.  The goal of treatment is curative.  We had discussed the composition of chemotherapy regimen, length of chemo cycle, duration of treatment and the time to assess response to treatment.    I explained to the patient the risks and benefits of chemotherapy including all but not limited to irreversible heart failure, hair loss, mouth sore, nausea, vomiting, low blood counts, bleeding, and risk of life threatening infection and even death, secondary malignancy, permanent neuropathy etc.   Patient voices understanding and willing to proceed chemotherapy.   # Chemotherapy education;  Antiemetics-Zofran and Compazine; EMLA cream sent to pharmacy # check baseline MUGA  # family history of breast cancer ovarian cancer, personal history of triple negative breast cancer, recommend genetic testing.  Discussed with patient and her daughter. Patent prefers to defer testing decision to later.   Supportive care measures are necessary for patient well-being and will be provided as necessary. We spent sufficient time to discuss many aspect of care, questions were answered to patient's satisfaction.  Follow Up Instructions: 05/24/2018 for lab MD assessment and chemotherapy.   I discussed the assessment and treatment plan with the patient. The patient was provided an opportunity to ask questions and all were answered. The patient agreed with the plan and demonstrated an understanding of the instructions.  The patient was advised to call back or seek an in-person evaluation if the symptoms worsen or if the condition fails to improve as anticipated.   I provided 40 minutes of  face-to-face video visit time during this encounter, and > 50% was spent counseling as documented under my assessment & plan.   Earlie Server, MD 04/29/2018 3:08 PM

## 2018-05-01 ENCOUNTER — Telehealth: Payer: Self-pay

## 2018-05-01 LAB — SURGICAL PATHOLOGY

## 2018-05-01 NOTE — Patient Instructions (Signed)
Doxorubicin injection What is this medicine? DOXORUBICIN (dox oh ROO bi sin) is a chemotherapy drug. It is used to treat many kinds of cancer like leukemia, lymphoma, neuroblastoma, sarcoma, and Wilms' tumor. It is also used to treat bladder cancer, breast cancer, lung cancer, ovarian cancer, stomach cancer, and thyroid cancer. This medicine may be used for other purposes; ask your health care provider or pharmacist if you have questions. COMMON BRAND NAME(S): Adriamycin, Adriamycin PFS, Adriamycin RDF, Rubex What should I tell my health care provider before I take this medicine? They need to know if you have any of these conditions: -heart disease -history of low blood counts caused by a medicine -liver disease -recent or ongoing radiation therapy -an unusual or allergic reaction to doxorubicin, other chemotherapy agents, other medicines, foods, dyes, or preservatives -pregnant or trying to get pregnant -breast-feeding How should I use this medicine? This drug is given as an infusion into a vein. It is administered in a hospital or clinic by a specially trained health care professional. If you have pain, swelling, burning or any unusual feeling around the site of your injection, tell your health care professional right away. Talk to your pediatrician regarding the use of this medicine in children. Special care may be needed. Overdosage: If you think you have taken too much of this medicine contact a poison control center or emergency room at once. NOTE: This medicine is only for you. Do not share this medicine with others. What if I miss a dose? It is important not to miss your dose. Call your doctor or health care professional if you are unable to keep an appointment. What may interact with this medicine? This medicine may interact with the following medications: -6-mercaptopurine -paclitaxel -phenytoin -St. John's Wort -trastuzumab -verapamil This list may not describe all possible  interactions. Give your health care provider a list of all the medicines, herbs, non-prescription drugs, or dietary supplements you use. Also tell them if you smoke, drink alcohol, or use illegal drugs. Some items may interact with your medicine. What should I watch for while using this medicine? This drug may make you feel generally unwell. This is not uncommon, as chemotherapy can affect healthy cells as well as cancer cells. Report any side effects. Continue your course of treatment even though you feel ill unless your doctor tells you to stop. There is a maximum amount of this medicine you should receive throughout your life. The amount depends on the medical condition being treated and your overall health. Your doctor will watch how much of this medicine you receive in your lifetime. Tell your doctor if you have taken this medicine before. You may need blood work done while you are taking this medicine. Your urine may turn red for a few days after your dose. This is not blood. If your urine is dark or brown, call your doctor. In some cases, you may be given additional medicines to help with side effects. Follow all directions for their use. Call your doctor or health care professional for advice if you get a fever, chills or sore throat, or other symptoms of a cold or flu. Do not treat yourself. This drug decreases your body's ability to fight infections. Try to avoid being around people who are sick. This medicine may increase your risk to bruise or bleed. Call your doctor or health care professional if you notice any unusual bleeding. Talk to your doctor about your risk of cancer. You may be more at risk for certain   types of cancers if you take this medicine. Do not become pregnant while taking this medicine or for 6 months after stopping it. Women should inform their doctor if they wish to become pregnant or think they might be pregnant. Men should not father a child while taking this medicine and  for 6 months after stopping it. There is a potential for serious side effects to an unborn child. Talk to your health care professional or pharmacist for more information. Do not breast-feed an infant while taking this medicine. This medicine has caused ovarian failure in some women and reduced sperm counts in some men This medicine may interfere with the ability to have a child. Talk with your doctor or health care professional if you are concerned about your fertility. This medicine may cause a decrease in Co-Enzyme Q-10. You should make sure that you get enough Co-Enzyme Q-10 while you are taking this medicine. Discuss the foods you eat and the vitamins you take with your health care professional. What side effects may I notice from receiving this medicine? Side effects that you should report to your doctor or health care professional as soon as possible: -allergic reactions like skin rash, itching or hives, swelling of the face, lips, or tongue -breathing problems -chest pain -fast or irregular heartbeat -low blood counts - this medicine may decrease the number of white blood cells, red blood cells and platelets. You may be at increased risk for infections and bleeding. -pain, redness, or irritation at site where injected -signs of infection - fever or chills, cough, sore throat, pain or difficulty passing urine -signs of decreased platelets or bleeding - bruising, pinpoint red spots on the skin, black, tarry stools, blood in the urine -swelling of the ankles, feet, hands -tiredness -weakness Side effects that usually do not require medical attention (report to your doctor or health care professional if they continue or are bothersome): -diarrhea -hair loss -mouth sores -nail discoloration or damage -nausea -red colored urine -vomiting This list may not describe all possible side effects. Call your doctor for medical advice about side effects. You may report side effects to FDA at  1-800-FDA-1088. Where should I keep my medicine? This drug is given in a hospital or clinic and will not be stored at home. NOTE: This sheet is a summary. It may not cover all possible information. If you have questions about this medicine, talk to your doctor, pharmacist, or health care provider.  2019 [employer]/Gold Standard (2016-08-18 11:01:26) Cyclophosphamide injection What is this medicine? CYCLOPHOSPHAMIDE (sye kloe FOSS fa mide) is a chemotherapy drug. It slows the growth of cancer cells. This medicine is used to treat many types of cancer like lymphoma, myeloma, leukemia, breast cancer, and ovarian cancer, to name a few. This medicine may be used for other purposes; ask your health care provider or pharmacist if you have questions. COMMON BRAND NAME(S): Cytoxan, Neosar What should I tell my health care provider before I take this medicine? They need to know if you have any of these conditions: -blood disorders -history of other chemotherapy -infection -kidney disease -liver disease -recent or ongoing radiation therapy -tumors in the bone marrow -an unusual or allergic reaction to cyclophosphamide, other chemotherapy, other medicines, foods, dyes, or preservatives -pregnant or trying to get pregnant -breast-feeding How should I use this medicine? This drug is usually given as an injection into a vein or muscle or by infusion into a vein. It is administered in a hospital or clinic by a specially trained health  care professional. Talk to your pediatrician regarding the use of this medicine in children. Special care may be needed. Overdosage: If you think you have taken too much of this medicine contact a poison control center or emergency room at once. NOTE: This medicine is only for you. Do not share this medicine with others. What if I miss a dose? It is important not to miss your dose. Call your doctor or health care professional if you are unable to keep an appointment. What  may interact with this medicine? This medicine may interact with the following medications: -amiodarone -amphotericin B -azathioprine -certain antiviral medicines for HIV or AIDS such as protease inhibitors (e.g., indinavir, ritonavir) and zidovudine -certain blood pressure medications such as benazepril, captopril, enalapril, fosinopril, lisinopril, moexipril, monopril, perindopril, quinapril, ramipril, trandolapril -certain cancer medications such as anthracyclines (e.g., daunorubicin, doxorubicin), busulfan, cytarabine, paclitaxel, pentostatin, tamoxifen, trastuzumab -certain diuretics such as chlorothiazide, chlorthalidone, hydrochlorothiazide, indapamide, metolazone -certain medicines that treat or prevent blood clots like warfarin -certain muscle relaxants such as succinylcholine -cyclosporine -etanercept -indomethacin -medicines to increase blood counts like filgrastim, pegfilgrastim, sargramostim -medicines used as general anesthesia -metronidazole -natalizumab This list may not describe all possible interactions. Give your health care provider a list of all the medicines, herbs, non-prescription drugs, or dietary supplements you use. Also tell them if you smoke, drink alcohol, or use illegal drugs. Some items may interact with your medicine. What should I watch for while using this medicine? Visit your doctor for checks on your progress. This drug may make you feel generally unwell. This is not uncommon, as chemotherapy can affect healthy cells as well as cancer cells. Report any side effects. Continue your course of treatment even though you feel ill unless your doctor tells you to stop. Drink water or other fluids as directed. Urinate often, even at night. In some cases, you may be given additional medicines to help with side effects. Follow all directions for their use. Call your doctor or health care professional for advice if you get a fever, chills or sore throat, or other  symptoms of a cold or flu. Do not treat yourself. This drug decreases your body's ability to fight infections. Try to avoid being around people who are sick. This medicine may increase your risk to bruise or bleed. Call your doctor or health care professional if you notice any unusual bleeding. Be careful brushing and flossing your teeth or using a toothpick because you may get an infection or bleed more easily. If you have any dental work done, tell your dentist you are receiving this medicine. You may get drowsy or dizzy. Do not drive, use machinery, or do anything that needs mental alertness until you know how this medicine affects you. Do not become pregnant while taking this medicine or for 1 year after stopping it. Women should inform their doctor if they wish to become pregnant or think they might be pregnant. Men should not father a child while taking this medicine and for 4 months after stopping it. There is a potential for serious side effects to an unborn child. Talk to your health care professional or pharmacist for more information. Do not breast-feed an infant while taking this medicine. This medicine may interfere with the ability to have a child. This medicine has caused ovarian failure in some women. This medicine has caused reduced sperm counts in some men. You should talk with your doctor or health care professional if you are concerned about your fertility. If you are going  to have surgery, tell your doctor or health care professional that you have taken this medicine. What side effects may I notice from receiving this medicine? Side effects that you should report to your doctor or health care professional as soon as possible: -allergic reactions like skin rash, itching or hives, swelling of the face, lips, or tongue -low blood counts - this medicine may decrease the number of white blood cells, red blood cells and platelets. You may be at increased risk for infections and  bleeding. -signs of infection - fever or chills, cough, sore throat, pain or difficulty passing urine -signs of decreased platelets or bleeding - bruising, pinpoint red spots on the skin, black, tarry stools, blood in the urine -signs of decreased red blood cells - unusually weak or tired, fainting spells, lightheadedness -breathing problems -dark urine -dizziness -palpitations -swelling of the ankles, feet, hands -trouble passing urine or change in the amount of urine -weight gain -yellowing of the eyes or skin Side effects that usually do not require medical attention (report to your doctor or health care professional if they continue or are bothersome): -changes in nail or skin color -hair loss -missed menstrual periods -mouth sores -nausea, vomiting This list may not describe all possible side effects. Call your doctor for medical advice about side effects. You may report side effects to FDA at 1-800-FDA-1088. Where should I keep my medicine? This drug is given in a hospital or clinic and will not be stored at home. NOTE: This sheet is a summary. It may not cover all possible information. If you have questions about this medicine, talk to your doctor, pharmacist, or health care provider.  2019 Elsevier/Gold Standard (2011-11-19 16:22:58) Paclitaxel injection What is this medicine? PACLITAXEL (PAK li TAX el) is a chemotherapy drug. It targets fast dividing cells, like cancer cells, and causes these cells to die. This medicine is used to treat ovarian cancer, breast cancer, lung cancer, Kaposi's sarcoma, and other cancers. This medicine may be used for other purposes; ask your health care provider or pharmacist if you have questions. COMMON BRAND NAME(S): Onxol, Taxol What should I tell my health care provider before I take this medicine? They need to know if you have any of these conditions: -history of irregular heartbeat -liver disease -low blood counts, like low white cell,  platelet, or red cell counts -lung or breathing disease, like asthma -tingling of the fingers or toes, or other nerve disorder -an unusual or allergic reaction to paclitaxel, alcohol, polyoxyethylated castor oil, other chemotherapy, other medicines, foods, dyes, or preservatives -pregnant or trying to get pregnant -breast-feeding How should I use this medicine? This drug is given as an infusion into a vein. It is administered in a hospital or clinic by a specially trained health care professional. Talk to your pediatrician regarding the use of this medicine in children. Special care may be needed. Overdosage: If you think you have taken too much of this medicine contact a poison control center or emergency room at once. NOTE: This medicine is only for you. Do not share this medicine with others. What if I miss a dose? It is important not to miss your dose. Call your doctor or health care professional if you are unable to keep an appointment. What may interact with this medicine? Do not take this medicine with any of the following medications: -disulfiram -metronidazole This medicine may also interact with the following medications: -antiviral medicines for hepatitis, HIV or AIDS -certain antibiotics like erythromycin and clarithromycin -certain  medicines for fungal infections like ketoconazole and itraconazole -certain medicines for seizures like carbamazepine, phenobarbital, phenytoin -gemfibrozil -nefazodone -rifampin -St. John's wort This list may not describe all possible interactions. Give your health care provider a list of all the medicines, herbs, non-prescription drugs, or dietary supplements you use. Also tell them if you smoke, drink alcohol, or use illegal drugs. Some items may interact with your medicine. What should I watch for while using this medicine? Your condition will be monitored carefully while you are receiving this medicine. You will need important blood work done  while you are taking this medicine. This medicine can cause serious allergic reactions. To reduce your risk you will need to take other medicine(s) before treatment with this medicine. If you experience allergic reactions like skin rash, itching or hives, swelling of the face, lips, or tongue, tell your doctor or health care professional right away. In some cases, you may be given additional medicines to help with side effects. Follow all directions for their use. This drug may make you feel generally unwell. This is not uncommon, as chemotherapy can affect healthy cells as well as cancer cells. Report any side effects. Continue your course of treatment even though you feel ill unless your doctor tells you to stop. Call your doctor or health care professional for advice if you get a fever, chills or sore throat, or other symptoms of a cold or flu. Do not treat yourself. This drug decreases your body's ability to fight infections. Try to avoid being around people who are sick. This medicine may increase your risk to bruise or bleed. Call your doctor or health care professional if you notice any unusual bleeding. Be careful brushing and flossing your teeth or using a toothpick because you may get an infection or bleed more easily. If you have any dental work done, tell your dentist you are receiving this medicine. Avoid taking products that contain aspirin, acetaminophen, ibuprofen, naproxen, or ketoprofen unless instructed by your doctor. These medicines may hide a fever. Do not become pregnant while taking this medicine. Women should inform their doctor if they wish to become pregnant or think they might be pregnant. There is a potential for serious side effects to an unborn child. Talk to your health care professional or pharmacist for more information. Do not breast-feed an infant while taking this medicine. Men are advised not to father a child while receiving this medicine. This product may contain  alcohol. Ask your pharmacist or healthcare provider if this medicine contains alcohol. Be sure to tell all healthcare providers you are taking this medicine. Certain medicines, like metronidazole and disulfiram, can cause an unpleasant reaction when taken with alcohol. The reaction includes flushing, headache, nausea, vomiting, sweating, and increased thirst. The reaction can last from 30 minutes to several hours. What side effects may I notice from receiving this medicine? Side effects that you should report to your doctor or health care professional as soon as possible: -allergic reactions like skin rash, itching or hives, swelling of the face, lips, or tongue -breathing problems -changes in vision -fast, irregular heartbeat -high or low blood pressure -mouth sores -pain, tingling, numbness in the hands or feet -signs of decreased platelets or bleeding - bruising, pinpoint red spots on the skin, black, tarry stools, blood in the urine -signs of decreased red blood cells - unusually weak or tired, feeling faint or lightheaded, falls -signs of infection - fever or chills, cough, sore throat, pain or difficulty passing urine -signs and symptoms  of liver injury like dark yellow or brown urine; general ill feeling or flu-like symptoms; light-colored stools; loss of appetite; nausea; right upper belly pain; unusually weak or tired; yellowing of the eyes or skin -swelling of the ankles, feet, hands -unusually slow heartbeat Side effects that usually do not require medical attention (report to your doctor or health care professional if they continue or are bothersome): -diarrhea -hair loss -loss of appetite -muscle or joint pain -nausea, vomiting -pain, redness, or irritation at site where injected -tiredness This list may not describe all possible side effects. Call your doctor for medical advice about side effects. You may report side effects to FDA at 1-800-FDA-1088. Where should I keep my  medicine? This drug is given in a hospital or clinic and will not be stored at home. NOTE: This sheet is a summary. It may not cover all possible information. If you have questions about this medicine, talk to your doctor, pharmacist, or health care provider.  2019 [employer]vier/Gold Standard (2016-09-07 13:14:55)

## 2018-05-01 NOTE — Progress Notes (Signed)
  Oncology Nurse Navigator Documentation  Navigator Location: [facility] (05/01/18 1700)   )Navigator Encounter Type: Telephone (05/01/18 1700) Telephone: Incoming Call;Appt Confirmation/Clarification;Education (05/01/18 1700)                         Education: Preparing for Upcoming Surgery/ Treatment (05/01/18 1700) Interventions: Coordination of Care;Education;Psycho-social support (05/01/18 1700)     Education Method: Teach-back;Verbal (05/01/18 1700)                Time Spent with Patient: 30 (05/01/18 1700)   Patient called needing clarification of appointments, and MUGA scan. Appointment with genetics needs clarification. Will contact Faith Rogue.   Requests assistance with wigs/scarves.  Will check with Kathi Simpers for availability.

## 2018-05-01 NOTE — Telephone Encounter (Signed)
Spoke with patient on the phone about chemotherapy education and patient would like to do Virtual visit on 05/10/2018 at 1:00 pm.  Informed patient I will mail her chemotherapy education and will do a Virtual visit 05/10/2018.

## 2018-05-02 ENCOUNTER — Other Ambulatory Visit: Payer: Self-pay

## 2018-05-02 ENCOUNTER — Telehealth: Payer: Self-pay | Admitting: Licensed Clinical Social Worker

## 2018-05-02 ENCOUNTER — Encounter: Payer: Self-pay | Admitting: General Surgery

## 2018-05-02 ENCOUNTER — Ambulatory Visit (INDEPENDENT_AMBULATORY_CARE_PROVIDER_SITE_OTHER): Payer: PPO | Admitting: General Surgery

## 2018-05-02 VITALS — BP 142/89 | HR 73 | Temp 97.5°F | Ht 65.0 in | Wt 244.0 lb

## 2018-05-02 DIAGNOSIS — Z171 Estrogen receptor negative status [ER-]: Secondary | ICD-10-CM

## 2018-05-02 DIAGNOSIS — C50212 Malignant neoplasm of upper-inner quadrant of left female breast: Secondary | ICD-10-CM

## 2018-05-02 NOTE — Telephone Encounter (Signed)
Spoke with patient to confirm that a [facility] appointment would work for her. She agreed and I sent her the invitation to her email.

## 2018-05-02 NOTE — Patient Instructions (Signed)
May use heat to the area for comfort.

## 2018-05-02 NOTE — Progress Notes (Signed)
Patient ID: Megan Harris, female   DOB: [birth year], 68 y.o.   MRN: [id number]  Chief Complaint  Patient presents with  . Routine Post Op    breast lumpectomy    HPI Megan Harris is a 68 y.o. female here for a post op left breast lumpectomy and port placement on 04/26/18.  HPI  Past Medical History:  Diagnosis Date  . Abdominal pain, left lower quadrant 2012  . Anemia    vitamin d deficiency  . Arthritis   . Asthma   . Back pain 2005   lower back arthritis  . Cancer ([place]) 04/26/2018   25mm, T1c, N0 triple negative  . Chronic cholecystitis 2012   has had choleycystectomy.  not an issue  . Constipation 2005  . Endocrine problem 2005  . GERD (gastroesophageal reflux disease) 2012  . Hyperlipidemia 2012  . Hypertension 2002  . Nausea with vomiting 2012  . Obesity, unspecified 2012  . Pneumonia   . PONV (postoperative nausea and vomiting)   . Special screening for malignant neoplasms, colon 2012    Past Surgical History:  Procedure Laterality Date  . BILATERAL CARPAL TUNNEL RELEASE Bilateral   . BREAST BIOPSY Left 04/13/2018   Pending Path  . BREAST EXCISIONAL BIOPSY Left early 90s   neg  . BREAST LUMPECTOMY Left 04/26/2018   Procedure: BREAST LUMPECTOMY WITH EXCISION OF SENTINEL NODE;  Surgeon: Robert Bellow, MD;  Location: [facility] ORS;  Service: General;  Laterality: Left;  . BUNIONECTOMY Bilateral 2003  . CHOLECYSTECTOMY  06/04/2010  . COLON RESECTION  2004   due diverticulitis   . COLONOSCOPY  2005   [place], Dr. Bary Castilla  . COLONOSCOPY  06/28/2012  . COLOSTOMY  2004  . COLOSTOMY REVERSAL  2004  . FOOT SURGERY Bilateral 2012   plantar faciatis  . HAND SURGERY Bilateral    carpal tunnel  . HERNIA REPAIR  2005   at colostomy site after reversal done  . JOINT REPLACEMENT    . KNEE ARTHROPLASTY Right 04/20/2017   Procedure: COMPUTER ASSISTED TOTAL KNEE ARTHROPLASTY;  Surgeon: Dereck Leep, MD;  Location: [facility] ORS;  Service: Orthopedics;  Laterality: Right;  .  KNEE ARTHROSCOPY Right 02/03/2015   Procedure: ARTHROSCOPY right knee, partial medial menisectomy, condyle malleolus, patella and femoral;  Surgeon: Dereck Leep, MD;  Location: [facility] ORS;  Service: Orthopedics;  Laterality: Right;  . NASAL SINUS SURGERY     2005  . PORTACATH PLACEMENT Right 04/26/2018   Procedure: INSERTION PORT-A-CATH RIGHT;  Surgeon: Robert Bellow, MD;  Location: [facility] ORS;  Service: General;  Laterality: Right;  . SALPINGOOPHORECTOMY  1998  . TONSILLECTOMY    . TUBAL LIGATION  1978  . UPPER GI ENDOSCOPY  06/28/2012  . VAGINAL HYSTERECTOMY  1987    Family History  Problem Relation Age of Onset  . Heart disease Father   . Diabetes Paternal Grandmother   . Breast cancer Paternal Grandmother 81  . Diabetes Paternal Grandfather   . Brain cancer Mother 56  . Bone cancer Sister 50  . Colon cancer Maternal Uncle   . Ovarian cancer Neg Hx     Social History Social History   Tobacco Use  . Smoking status: Never Smoker  . Smokeless tobacco: Never Used  Substance Use Topics  . Alcohol use: Yes    Comment: socially  . Drug use: No    Allergies  Allergen Reactions  . Codeine Other (See Comments)    Goes to sleep and  unable to wake up.  Marland Kitchen Penicillins Hives    Did it involve swelling of the face/tongue/throat, SOB, or low BP? No Did it involve sudden or severe rash/hives, skin peeling, or any reaction on the inside of your mouth or nose? No Did you need to seek medical attention at a hospital or doctor's office? Yes When did it last happen?50s If all above answers are "NO", may proceed with cephalosporin use.     Current Outpatient Medications  Medication Sig Dispense Refill  . acetaminophen (TYLENOL) 500 MG tablet Take 500 mg by mouth every 6 (six) hours as needed.    Marland Kitchen albuterol (PROVENTIL HFA;VENTOLIN HFA) 108 (90 Base) MCG/ACT inhaler Inhale 2 puffs into the lungs every 6 (six) hours as needed for wheezing or shortness of breath.     Marland Kitchen amLODipine  (NORVASC) 5 MG tablet Take 5 mg by mouth daily.    Marland Kitchen aspirin EC 81 MG tablet Take 81 mg by mouth daily.    Marland Kitchen atorvastatin (LIPITOR) 10 MG tablet Take 10 mg by mouth daily.    . cetirizine (ZYRTEC) 10 MG tablet Take 10 mg by mouth daily.    . Cholecalciferol (VITAMIN D3 MAXIMUM STRENGTH) 5000 units capsule Take 5,000 Units by mouth daily.    Marland Kitchen dextromethorphan-guaiFENesin (MUCINEX DM) 30-600 MG 12hr tablet Take 1 tablet by mouth daily as needed for cough.    . gabapentin (NEURONTIN) 300 MG capsule Take 300 mg by mouth 3 (three) times daily.    . hydrochlorothiazide (HYDRODIURIL) 25 MG tablet Take 25 mg by mouth daily.    . lansoprazole (PREVACID) 30 MG capsule Take 30 mg by mouth 2 (two) times daily.     Marland Kitchen levothyroxine (SYNTHROID, LEVOTHROID) 50 MCG tablet Take 50 mcg by mouth daily before breakfast.     . lidocaine-prilocaine (EMLA) cream Apply to affected area once 30 g 3  . losartan (COZAAR) 100 MG tablet Take 100 mg by mouth daily.    . montelukast (SINGULAIR) 10 MG tablet Take 10 mg by mouth at bedtime.    . ondansetron (ZOFRAN) 8 MG tablet Take 1 tablet (8 mg total) by mouth 2 (two) times daily as needed. Start on the third day after chemotherapy. 30 tablet 1  . prochlorperazine (COMPAZINE) 10 MG tablet Take 1 tablet (10 mg total) by mouth every 6 (six) hours as needed (Nausea or vomiting). 30 tablet 1  . zolpidem (AMBIEN) 10 MG tablet Take 10 mg by mouth at bedtime.      No current facility-administered medications for this visit.     Review of Systems Review of Systems  Blood pressure (!) 142/89, pulse 73, temperature (!) 97.5 F (36.4 C), height 5\' 5"  (1.651 m), weight 244 lb (110.7 kg), SpO2 96 %.  Physical Exam Physical Exam Exam conducted with a chaperone present.  Chest:       Data Reviewed A. BREAST, LEFT UPPER INNER QUADRANT; WIDE EXCISION:  - INVASIVE MAMMARY CARCINOMA.  - SEE CANCER SUMMARY BELOW.  - BIOPSY SITE CHANGE WITH HEMATOMA AND RIBBON CLIP.  -  UNREMARKABLE SKIN.   B. SENTINEL LYMPH NODE #1; EXCISION:  - ONE LYMPH NODE NEGATIVE FOR MALIGNANCY (0/1).   C. NEW BREAST MARGIN, SUPERIOR; EXCISION:  - BENIGN BREAST TISSUE WITH BIOPSY CHANGE AND HEMATOMA.  - NEGATIVE FOR MALIGNANCY.   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   Regional Lymph Nodes: Uninvolved by tumor cells    Total Number of Lymph Nodes Examined: 1       Number of Sentinel Nodes Examined: 1  Treatment Effect in the Breast: No known presurgical therapy  Lymphovascular Invasion: Not identified  Pathologic Stage Classification (pTNM, AJCC 8th Edition): pT1c pN0 (sn)   Assessment Doing well post wide excision, sentinel node biopsy and PowerPort placement.  Plan The patient will continue to wear her bra for support.  Follow-up is scheduled in the near future with medical oncology.  She will be a candidate for post chemotherapy adjuvant radiation.    Forest Gleason Filmore Molyneux 05/03/2018, 8:15 AM

## 2018-05-03 ENCOUNTER — Other Ambulatory Visit: Payer: Self-pay

## 2018-05-03 ENCOUNTER — Encounter: Payer: Self-pay | Admitting: General Surgery

## 2018-05-04 ENCOUNTER — Inpatient Hospital Stay: Payer: PPO | Admitting: Licensed Clinical Social Worker

## 2018-05-04 ENCOUNTER — Encounter: Payer: Self-pay | Admitting: Licensed Clinical Social Worker

## 2018-05-04 DIAGNOSIS — Z808 Family history of malignant neoplasm of other organs or systems: Secondary | ICD-10-CM | POA: Insufficient documentation

## 2018-05-04 DIAGNOSIS — C50919 Malignant neoplasm of unspecified site of unspecified female breast: Secondary | ICD-10-CM

## 2018-05-04 DIAGNOSIS — Z800 Family history of malignant neoplasm of digestive organs: Secondary | ICD-10-CM | POA: Insufficient documentation

## 2018-05-04 DIAGNOSIS — Z801 Family history of malignant neoplasm of trachea, bronchus and lung: Secondary | ICD-10-CM | POA: Insufficient documentation

## 2018-05-04 NOTE — Progress Notes (Addendum)
REFERRING PROVIDER: Earlie Server, MD [place], [place] [address]  PRIMARY PROVIDER:  Idelle Crouch, MD  PRIMARY REASON FOR VISIT:  1. 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 Family history of brain cancer   3. Family history of bone cancer   4. Family history of colon cancer   5. Family history of lung cancer      HISTORY OF PRESENT ILLNESS:   Megan Harris, a 68 y.o. female, was seen for a [facility] cancer genetics consultation at the request of Dr. Tasia Catchings due to a personal and family history of cancer.  Megan Harris presents to clinic today to discuss the possibility of a hereditary predisposition to cancer, genetic testing, and to further clarify her future cancer risks, as well as potential cancer risks for family members.   In 2020, at the age of 69, Megan Harris was diagnosed with left breast cancer, triple negative. She had a lumpectomy and will begin chemotherapy in May.  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 ([place])    05/24/2018 -  Chemotherapy    The patient had DOXOrubicin (ADRIAMYCIN) chemo injection 136 mg, 60 mg/m2, Intravenous,  Once, 0 of 4 cycles PALONOSETRON HCL INJECTION 0.25 MG/5ML, 0.25 mg, Intravenous,  Once, 0 of 4 cycles pegfilgrastim-cbqv (UDENYCA) injection 6 mg, 6 mg, Subcutaneous, Once, 0 of 4 cycles cyclophosphamide (CYTOXAN) 1,360 mg in sodium chloride 0.9 % 250 mL chemo infusion, 600 mg/m2, Intravenous,  Once, 0 of 4 cycles PACLitaxel (TAXOL) 180 mg in sodium chloride 0.9 % 250 mL chemo infusion (</= 88m/m2), 80 mg/m2, Intravenous,  Once, 0 of 12 cycles FOSAPREPITANT 150MG + DEXAMETHASONE INFUSION [facility], , Intravenous,  Once, 0 of 4 cycles   for chemotherapy treatment.       RISK FACTORS:  Menarche was at age 68 First live birth at age 68 OCP use for approximately 1 month. Ovaries intact: No. Hysterectomy: Yes. Menopausal status: Postmenopausal HRT use: 1 month Colonoscopy: Yes, normal. Mammogram within the last year: Yes Number of breast biopsies: 2 Any excessive radiation exposure in the past: No  Past Medical History:  Diagnosis Date  . Abdominal pain, left lower quadrant 2012  . Anemia    vitamin d deficiency  . Arthritis   . Asthma   . Back pain 2005   lower back arthritis  . Cancer ([place] 04/26/2018   126m T1c, N0 triple negative  . Chronic cholecystitis 2012   has had choleycystectomy.  not an issue  . Constipation 2005  . Endocrine problem 2005  . Family history of bone cancer   . Family history of brain cancer   . Family history of colon cancer   . Family history of lung cancer   . GERD (gastroesophageal reflux disease) 2012  . Hyperlipidemia 2012  . Hypertension 2002  . Nausea with vomiting 2012  . Obesity, unspecified 2012  . Pneumonia   . PONV (postoperative nausea and vomiting)   . Special screening for malignant neoplasms, colon 2012    Past Surgical History:  Procedure Laterality Date  . BILATERAL CARPAL TUNNEL RELEASE Bilateral   . BREAST BIOPSY Left 04/13/2018  Pending Path  . BREAST EXCISIONAL BIOPSY Left early 90s   neg  . BREAST LUMPECTOMY Left 04/26/2018   Procedure: BREAST LUMPECTOMY WITH EXCISION OF SENTINEL NODE;  Surgeon: Robert Bellow, MD;  Location: [facility] ORS;  Service: General;  Laterality: Left;  . BUNIONECTOMY Bilateral 2003  . CHOLECYSTECTOMY  06/04/2010  . COLON RESECTION  2004   due diverticulitis   . COLONOSCOPY  2005   [place], Dr. Bary Castilla  . COLONOSCOPY  06/28/2012  . COLOSTOMY  2004  . COLOSTOMY REVERSAL  2004  . FOOT SURGERY Bilateral 2012   plantar faciatis  . HAND SURGERY Bilateral    carpal tunnel  . HERNIA REPAIR  2005   at colostomy site  after reversal done  . JOINT REPLACEMENT    . KNEE ARTHROPLASTY Right 04/20/2017   Procedure: COMPUTER ASSISTED TOTAL KNEE ARTHROPLASTY;  Surgeon: Dereck Leep, MD;  Location: [facility] ORS;  Service: Orthopedics;  Laterality: Right;  . KNEE ARTHROSCOPY Right 02/03/2015   Procedure: ARTHROSCOPY right knee, partial medial menisectomy, condyle malleolus, patella and femoral;  Surgeon: Dereck Leep, MD;  Location: [facility] ORS;  Service: Orthopedics;  Laterality: Right;  . NASAL SINUS SURGERY     2005  . PORTACATH PLACEMENT Right 04/26/2018   Procedure: INSERTION PORT-A-CATH RIGHT;  Surgeon: Robert Bellow, MD;  Location: [facility] OR[facility];  Service: General;  Laterality: Right;  . SALPINGOOPHORECTOMY  1998  . TONSILLECTOMY    . TUBAL LIGATION  1978  . UPPER GI ENDOSCOPY  06/28/2012  . VAGINAL HYSTERECTOMY  1987    Social History   Socioeconomic History  . Marital status: Married    Spouse name: Francee Piccolo  . Number of children: 2  . Years of education: Not on file  . Highest education level: Not on file  Occupational History  . 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 Financial resource strain: Not on file  . Food insecurity:    Worry: Not on file    Inability: Not on file  . Transportation needs:    Medical: Not on file    Non-medical: Not on file  Tobacco Use  . Smoking status: Never Smoker  . Smokeless tobacco: Never Used  Substance and Sexual Activity  . Alcohol use: Yes    Comment: socially  . Drug use: No  . Sexual activity: Yes    Birth control/protection: Surgical  Lifestyle  . Physical activity:    Days per week: Not on file    Minutes per session: Not on file  . Stress: Not on file  Relationships  . Social connections:    Talks on phone: Not on file    Gets together: Not on file    Attends religious service: Not on file    Active member of club or organization: Not on file    Attends meetings of clubs or organizations: Not on file     Relationship status: Not on file  Other Topics Concern  . Not on file  Social History Narrative  . Not on file     FAMILY HISTORY:  We obtained a detailed, 4-generation family history.  Significant diagnoses are listed below: Family History  Problem Relation Age of Onset  . Heart disease Father   . Diabetes Paternal Grandmother   . Diabetes Paternal Grandfather   . Brain cancer Mother 59  . Bone cancer Sister 49  . Colon cancer Maternal Uncle  dx 50s-60s  . Throat cancer Cousin   . Leukemia Cousin   . Lung cancer Cousin   . Ovarian cancer Neg Hx     Ms. Lahue has a daughter, 76, and a son, 64. Neither have had cancer. Ms. Harnden has one brother, 63, with no cancer history, and twin sisters. One of the twins died at 10 due to bone cancer, but Ms. Weist does not have specifics. The other twin sister is living at 26 with no history of cancer.   Ms. Heeren mother died at 13 due to brain cancer. The patient does not have specific details about the type of brain cancer. Ms. Roebuck has one maternal uncle who was diagnosed with colon cancer in either his 16s or 42s and died in his 57s. He had 2 sons, one died of throat cancer and had history of smoking. Ms. Foulk does not have much information about her maternal grandparents, but she knows they died young and possibly due to a car accident.  Ms. Ligas father died at 42 of a heart attack. He had 4 brothers and 1 sister. No cancers for these individuals. Ms. Tatro does have a paternal cousin who has leukemia, and another cousin who had lung cancer with a history of smoking. Ms. Iannaccone paternal grandmother died of a heart attack in her late 3s, she has no information about paternal grandfather.  Ms. Cashin is unaware of previous family history of genetic testing for hereditary cancer risks. Patient's maternal ancestors are of Native American descent, and paternal ancestors are of Native American descent. There is no reported Ashkenazi  Jewish ancestry. There is no known consanguinity.  GENETIC COUNSELING ASSESSMENT: Ms. Ancheta is a 68 y.o. female with a personal and family history which is somewhat suggestive of a hereditary predisposition to cancer. We, therefore, discussed and recommended the following at today's visit.   DISCUSSION: We discussed that 5-10% of breast cancer is hereditary, with most cases associated with BRCA1/BRCA2.  There are other genes associated with breast cancer, and other genes associated with other types of cancers as well. We reviewed the characteristics, features and inheritance patterns of hereditary cancer syndromes. We also discussed genetic testing, including the appropriate family members to test, the process of testing, insurance coverage and turn-around-time for results. We discussed the implications of a negative, positive and/or variant of uncertain significant result. We recommended Ms. Trawick pursue genetic testing for the Multi-Cancer gene panel.   The Multi-Cancer Panel offered by [facility] includes sequencing and/or deletion duplication testing of the following 84 genes: AIP, ALK, APC, ATM, AXIN2,BAP1,  BARD1, BLM, BMPR1A, BRCA1, BRCA2, BRIP1, CASR, CDC73, CDH1, CDK4, CDKN1B, CDKN1C, CDKN2A (p14ARF), CDKN2A (p16INK4a), CEBPA, CHEK2, CTNNA1, DICER1, DIS3L2, EGFR (c.2369C>T, p.Thr790Met variant only), EPCAM (Deletion/duplication testing only), FH, FLCN, GATA2, GPC3, GREM1 (Promoter region deletion/duplication testing only), HOXB13 (c.251G>A, p.Gly84Glu), HRAS, KIT, MAX, MEN1, MET, MITF (c.952G>A, p.Glu318Lys variant only), MLH1, MSH2, MSH3, MSH6, MUTYH, NBN, NF1, NF2, NTHL1, PALB2, PDGFRA, PHOX2B, PMS2, POLD1, POLE, POT1, PRKAR1A, PTCH1, PTEN, RAD50, RAD51C, RAD51D, RB1, RECQL4, RET, RUNX1, SDHAF2, SDHA (sequence changes only), SDHB, SDHC, SDHD, SMAD4, SMARCA4, SMARCB1, SMARCE1, STK11, SUFU, TERC, TERT, TMEM127, TP53, TSC1, TSC2, VHL, WRN and WT1.   Based on Ms. Lamadrid's personal and family history of  cancer, she does not meet medical criteria for genetic testing, however we still offered her testing. She may have an out of pocket cost.  PLAN:  Ms. Chern did not wish to pursue genetic testing at today's visit. We understand this  decision and remain available to coordinate genetic testing at any time in the future. We, therefore, recommend Ms. Lecount continue to follow the cancer screening guidelines given by her oncology and primary healthcare provider.  Lastly, we encouraged Ms. Coye to remain in contact with cancer genetics annually so that we can continuously update the family history and inform her of any changes in cancer genetics and testing that may be of benefit for this family.   Ms. Vanallen questions were answered to her satisfaction today. Our contact information was provided should additional questions or concerns arise. Thank you for the referral and allowing [place] to share in the care of your patient.   Faith Rogue, MS Genetic Counselor [email or web address] Phone: [phone]  The patient was seen for a total of 60 minutes in face-to-face genetic counseling.  Dr. Grayland Ormond was available for discussion regarding this case.

## 2018-05-08 ENCOUNTER — Other Ambulatory Visit: Payer: PPO

## 2018-05-08 NOTE — Progress Notes (Signed)
  Oncology Nurse Navigator Documentation  Navigator Location: [facility] (05/08/18 1600)   )Navigator Encounter Type: Other (05/08/18 1600) Telephone: Education (05/08/18 1600)                   Patient Visit Type: Follow-up (05/08/18 1600) Treatment Phase: Pre-Tx/Tx Discussion (05/08/18 1600)   Education: Preparing for Upcoming Surgery/ Treatment (05/08/18 1600) Interventions: Talk to a survivor (05/08/18 1600) Referrals: Nutrition/dietician (05/08/18 1600)   Education Method: Teach-back;Verbal (05/08/18 1600)  Support Groups/Services: Breast Support Group;Friends and Family (05/08/18 1600) Specialty Items/DME: Wigs (05/08/18 1600)           Time Spent with Patient: 45 (05/08/18 1600)   Patient came by to pick up turbans.  Scheduled for appointment at [facility] in [place] for wigs, [place] etc..  Approval for [employer] Support sent to [facility].  States she had allergy to tape from surgical dressing with redness, but it looks clear today. Chemotherapy education will be on Wednesday. Grateful for psychosocial support.

## 2018-05-09 ENCOUNTER — Other Ambulatory Visit: Payer: Self-pay

## 2018-05-09 ENCOUNTER — Encounter
Admission: RE | Admit: 2018-05-09 | Discharge: 2018-05-09 | Disposition: A | Discharge status: Home / Self Care | Payer: PPO | Source: Ambulatory Visit | Attending: Oncology | Admitting: Oncology

## 2018-05-09 DIAGNOSIS — C50919 Malignant neoplasm of unspecified site of unspecified female breast: Secondary | ICD-10-CM | POA: Diagnosis not present

## 2018-05-09 DIAGNOSIS — Z7189 Other specified counseling: Secondary | ICD-10-CM | POA: Diagnosis not present

## 2018-05-09 DIAGNOSIS — Z17421 Hormone receptor negative with human epidermal growth factor receptor 2 negative status: Secondary | ICD-10-CM

## 2018-05-09 MED ORDER — TECHNETIUM TC 99M-LABELED RED BLOOD CELLS IV KIT
23.7500 | PACK | Freq: Once | INTRAVENOUS | Status: AC | PRN
  Administered 2018-05-09: 23.75 via INTRAVENOUS

## 2018-05-10 ENCOUNTER — Inpatient Hospital Stay: Payer: PPO

## 2018-05-16 ENCOUNTER — Other Ambulatory Visit: Payer: Self-pay

## 2018-05-16 ENCOUNTER — Encounter: Payer: Self-pay | Admitting: General Surgery

## 2018-05-16 ENCOUNTER — Ambulatory Visit (INDEPENDENT_AMBULATORY_CARE_PROVIDER_SITE_OTHER): Payer: PPO | Admitting: General Surgery

## 2018-05-16 VITALS — BP 138/78 | HR 76 | Temp 97.7°F | Resp 15 | Ht 67.0 in | Wt 247.0 lb

## 2018-05-16 DIAGNOSIS — Z171 Estrogen receptor negative status [ER-]: Secondary | ICD-10-CM

## 2018-05-16 DIAGNOSIS — C50212 Malignant neoplasm of upper-inner quadrant of left female breast: Secondary | ICD-10-CM

## 2018-05-16 NOTE — Patient Instructions (Signed)
Return in one month. The patient is aware to call back for any questions or concerns.  

## 2018-05-16 NOTE — Progress Notes (Signed)
Patient ID: Megan Harris, female   DOB: January 11, 1951, 68 y.o.   MRN: [id number]  Chief Complaint  Patient presents with  . Routine Post Op    left breast lumpectomy     HPI Megan Harris is a 68 y.o. female here today for her post op left lumpectomy and port placement done on 04/26/2018. Patient states she is doing well at this time.  HPI  Past Medical History:  Diagnosis Date  . Abdominal pain, left lower quadrant 2012  . Anemia    vitamin d deficiency  . Arthritis   . Asthma   . Back pain 2005   lower back arthritis  . Cancer ([place]) 04/26/2018   59mm, T1c, N0 triple negative  . Chronic cholecystitis 2012   has had choleycystectomy.  not an issue  . Constipation 2005  . Endocrine problem 2005  . Family history of bone cancer   . Family history of brain cancer   . Family history of colon cancer   . Family history of lung cancer   . GERD (gastroesophageal reflux disease) 2012  . Hyperlipidemia 2012  . Hypertension 2002  . Nausea with vomiting 2012  . Obesity, unspecified 2012  . Pneumonia   . PONV (postoperative nausea and vomiting)   . Special screening for malignant neoplasms, colon 2012    Past Surgical History:  Procedure Laterality Date  . BILATERAL CARPAL TUNNEL RELEASE Bilateral   . BREAST BIOPSY Left 04/13/2018   Pending Path  . BREAST EXCISIONAL BIOPSY Left early 90s   neg  . BREAST LUMPECTOMY Left 04/26/2018   Procedure: BREAST LUMPECTOMY WITH EXCISION OF SENTINEL NODE;  Surgeon: Robert Bellow, MD;  Location: [facility];  Service: General;  Laterality: Left;  . BUNIONECTOMY Bilateral 2003  . CHOLECYSTECTOMY  06/04/2010  . COLON RESECTION  2004   due diverticulitis   . COLONOSCOPY  2005   [place], Dr. Bary Castilla  . COLONOSCOPY  06/28/2012  . COLOSTOMY  2004  . COLOSTOMY REVERSAL  2004  . FOOT SURGERY Bilateral 2012   plantar faciatis  . HAND SURGERY Bilateral    carpal tunnel  . HERNIA REPAIR  2005   at colostomy site after reversal done  . JOINT  REPLACEMENT    . KNEE ARTHROPLASTY Right 04/20/2017   Procedure: COMPUTER ASSISTED TOTAL KNEE ARTHROPLASTY;  Surgeon: Dereck Leep, MD;  Location: [facility];  Service: Orthopedics;  Laterality: Right;  . KNEE ARTHROSCOPY Right 02/03/2015   Procedure: ARTHROSCOPY right knee, partial medial menisectomy, condyle malleolus, patella and femoral;  Surgeon: Dereck Leep, MD;  Location: [facility];  Service: Orthopedics;  Laterality: Right;  . NASAL SINUS SURGERY     2005  . PORTACATH PLACEMENT Right 04/26/2018   Procedure: INSERTION PORT-A-CATH RIGHT;  Surgeon: Robert Bellow, MD;  Location: [facility];  Service: General;  Laterality: Right;  . SALPINGOOPHORECTOMY  1998  . TONSILLECTOMY    . TUBAL LIGATION  1978  . UPPER GI ENDOSCOPY  06/28/2012  . VAGINAL HYSTERECTOMY  1987    Family History  Problem Relation Age of Onset  . Heart disease Father   . Diabetes Paternal Grandmother   . Diabetes Paternal Grandfather   . Brain cancer Mother 62  . Bone cancer Sister 85  . Colon cancer Maternal Uncle        dx 50s-60s  . Throat cancer Cousin   . Leukemia Cousin   . Lung cancer Cousin   . Ovarian cancer Neg Hx  Social History Social History   Tobacco Use  . Smoking status: Never Smoker  . Smokeless tobacco: Never Used  Substance Use Topics  . Alcohol use: Yes    Comment: socially  . Drug use: No    Allergies  Allergen Reactions  . Codeine Other (See Comments)    Goes to sleep and unable to wake up.  Marland Kitchen Penicillins Hives    Did it involve swelling of the face/tongue/throat, SOB, or low BP? No Did it involve sudden or severe rash/hives, skin peeling, or any reaction on the inside of your mouth or nose? No Did you need to seek medical attention at a hospital or doctor's office? Yes When did it last happen?50s If all above answers are "NO", may proceed with cephalosporin use.     Current Outpatient Medications  Medication Sig Dispense Refill  . acetaminophen (TYLENOL)  500 MG tablet Take 500 mg by mouth every 6 (six) hours as needed.    Marland Kitchen albuterol (PROVENTIL HFA;VENTOLIN HFA) 108 (90 Base) MCG/ACT inhaler Inhale 2 puffs into the lungs every 6 (six) hours as needed for wheezing or shortness of breath.     Marland Kitchen amLODipine (NORVASC) 5 MG tablet Take 5 mg by mouth daily.    Marland Kitchen aspirin EC 81 MG tablet Take 81 mg by mouth daily.    Marland Kitchen atorvastatin (LIPITOR) 10 MG tablet Take 10 mg by mouth daily.    . cetirizine (ZYRTEC) 10 MG tablet Take 10 mg by mouth daily.    . Cholecalciferol (VITAMIN D3 MAXIMUM STRENGTH) 5000 units capsule Take 5,000 Units by mouth daily.    Marland Kitchen dextromethorphan-guaiFENesin (MUCINEX DM) 30-600 MG 12hr tablet Take 1 tablet by mouth daily as needed for cough.    . gabapentin (NEURONTIN) 300 MG capsule Take 300 mg by mouth 3 (three) times daily.    . hydrochlorothiazide (HYDRODIURIL) 25 MG tablet Take 25 mg by mouth daily.    . lansoprazole (PREVACID) 30 MG capsule Take 30 mg by mouth 2 (two) times daily.     Marland Kitchen levothyroxine (SYNTHROID, LEVOTHROID) 50 MCG tablet Take 50 mcg by mouth daily before breakfast.     . lidocaine-prilocaine (EMLA) cream Apply to affected area once 30 g 3  . losartan (COZAAR) 100 MG tablet Take 100 mg by mouth daily.    . montelukast (SINGULAIR) 10 MG tablet Take 10 mg by mouth at bedtime.    . ondansetron (ZOFRAN) 8 MG tablet Take 1 tablet (8 mg total) by mouth 2 (two) times daily as needed. Start on the third day after chemotherapy. 30 tablet 1  . prochlorperazine (COMPAZINE) 10 MG tablet Take 1 tablet (10 mg total) by mouth every 6 (six) hours as needed (Nausea or vomiting). 30 tablet 1  . zolpidem (AMBIEN) 10 MG tablet Take 10 mg by mouth at bedtime.      No current facility-administered medications for this visit.     Review of Systems Review of Systems  Constitutional: Negative.   Respiratory: Negative.   Cardiovascular: Negative.     Blood pressure 138/78, pulse 76, temperature 97.7 F (36.5 C), temperature  source Skin, resp. rate 15, height 5\' 7"  (1.702 m), weight 247 lb (112 kg), SpO2 97 %.  Physical Exam Physical Exam Exam conducted with a chaperone present.  Constitutional:      Appearance: She is well-developed.  Eyes:     General: No scleral icterus.    Conjunctiva/sclera: Conjunctivae normal.  Neck:     Musculoskeletal: Neck supple.  Cardiovascular:  Rate and Rhythm: Normal rate and regular rhythm.     Heart sounds: Normal heart sounds.  Pulmonary:     Effort: Pulmonary effort is normal.     Breath sounds: Normal breath sounds.  Chest:     Breasts:        Right: No inverted nipple, mass, nipple discharge, skin change or tenderness.        Left: No inverted nipple, mass, nipple discharge, skin change or tenderness.    Lymphadenopathy:     Cervical: No cervical adenopathy.  Skin:    General: Skin is warm and dry.  Neurological:     Mental Status: She is alert and oriented to person, place, and time.     Data Reviewed No new labs.  Assessment Doing well status post wide excision, sentinel node biopsy and PowerPort placement.  Plan   Return in one month.The patient is aware to call back for any questions or concerns.  HPI, Physical Exam, Assessment and Plan have been scribed under the direction and in the presence of Hervey Ard, MD.  Gaspar Cola, CMA  I have completed the exam and reviewed the above documentation for accuracy and completeness.  I agree with the above.  Haematologist has been used and any errors in dictation or transcription are unintentional.  Hervey Ard, M.D., F.A.C.S.  Forest Gleason Elyanah Farino 05/17/2018, 8:53 PM

## 2018-05-23 NOTE — Progress Notes (Signed)
  Oncology Nurse Navigator Documentation  Navigator Location: [facility] (05/23/18 1000)   )Navigator Encounter Type: Telephone (05/23/18 1000) Telephone: Incoming Call;Appt Confirmation/Clarification;Education (05/23/18 1000)                   Patient Visit Type: Follow-up (05/23/18 1000) Treatment Phase: Pre-Tx/Tx Discussion (05/23/18 1000) Barriers/Navigation Needs: Family concerns;Education (05/23/18 1000) Education: Preparing for Upcoming Surgery/ Treatment (05/23/18 1000)     Coordination of Care: Appts;Chemo (05/23/18 1000)                  Time Spent with Patient: 30 (05/23/18 1000)   Patient phoned with questions about her first chemotherapy which she will start tomorrow.  Answered questions, screened for coronavirus.  Reminded no visitors.

## 2018-05-24 ENCOUNTER — Inpatient Hospital Stay (HOSPITAL_BASED_OUTPATIENT_CLINIC_OR_DEPARTMENT_OTHER): Payer: PPO | Admitting: Oncology

## 2018-05-24 ENCOUNTER — Inpatient Hospital Stay: Payer: PPO

## 2018-05-24 ENCOUNTER — Inpatient Hospital Stay: Payer: PPO | Attending: Oncology

## 2018-05-24 ENCOUNTER — Encounter: Payer: Self-pay | Admitting: Oncology

## 2018-05-24 ENCOUNTER — Other Ambulatory Visit: Payer: Self-pay

## 2018-05-24 VITALS — BP 148/90 | HR 68 | Temp 97.4°F | Resp 20 | Ht 67.0 in | Wt 248.5 lb

## 2018-05-24 DIAGNOSIS — Z7189 Other specified counseling: Secondary | ICD-10-CM

## 2018-05-24 DIAGNOSIS — C50919 Malignant neoplasm of unspecified site of unspecified female breast: Secondary | ICD-10-CM

## 2018-05-24 DIAGNOSIS — Z171 Estrogen receptor negative status [ER-]: Secondary | ICD-10-CM

## 2018-05-24 DIAGNOSIS — Z5111 Encounter for antineoplastic chemotherapy: Secondary | ICD-10-CM | POA: Diagnosis not present

## 2018-05-24 DIAGNOSIS — Z803 Family history of malignant neoplasm of breast: Secondary | ICD-10-CM | POA: Diagnosis not present

## 2018-05-24 DIAGNOSIS — Z800 Family history of malignant neoplasm of digestive organs: Secondary | ICD-10-CM

## 2018-05-24 DIAGNOSIS — E876 Hypokalemia: Secondary | ICD-10-CM

## 2018-05-24 DIAGNOSIS — Z95828 Presence of other vascular implants and grafts: Secondary | ICD-10-CM

## 2018-05-24 DIAGNOSIS — Z5189 Encounter for other specified aftercare: Secondary | ICD-10-CM | POA: Insufficient documentation

## 2018-05-24 DIAGNOSIS — C50212 Malignant neoplasm of upper-inner quadrant of left female breast: Secondary | ICD-10-CM | POA: Diagnosis not present

## 2018-05-24 DIAGNOSIS — Z808 Family history of malignant neoplasm of other organs or systems: Secondary | ICD-10-CM

## 2018-05-24 LAB — CBC WITH DIFFERENTIAL/PLATELET
Abs Immature Granulocytes: 0.01 10*3/uL (ref 0.00–0.07)
Basophils Absolute: 0.1 10*3/uL (ref 0.0–0.1)
Basophils Relative: 1 %
Eosinophils Absolute: 0.5 10*3/uL (ref 0.0–0.5)
Eosinophils Relative: 8 %
HCT: 39.5 % (ref 36.0–46.0)
Hemoglobin: 13 g/dL (ref 12.0–15.0)
Immature Granulocytes: 0 %
Lymphocytes Relative: 33 %
Lymphs Abs: 1.9 10*3/uL (ref 0.7–4.0)
MCH: 28.8 pg (ref 26.0–34.0)
MCHC: 32.9 g/dL (ref 30.0–36.0)
MCV: 87.6 fL (ref 80.0–100.0)
Monocytes Absolute: 0.5 10*3/uL (ref 0.1–1.0)
Monocytes Relative: 8 %
Neutro Abs: 3 10*3/uL (ref 1.7–7.7)
Neutrophils Relative %: 50 %
Platelets: 172 10*3/uL (ref 150–400)
RBC: 4.51 MIL/uL (ref 3.87–5.11)
RDW: 13.7 % (ref 11.5–15.5)
WBC: 5.9 10*3/uL (ref 4.0–10.5)
nRBC: 0 % (ref 0.0–0.2)

## 2018-05-24 LAB — COMPREHENSIVE METABOLIC PANEL
ALT: 60 U/L — ABNORMAL HIGH (ref 0–44)
AST: 52 U/L — ABNORMAL HIGH (ref 15–41)
Albumin: 3.6 g/dL (ref 3.5–5.0)
Alkaline Phosphatase: 75 U/L (ref 38–126)
Anion gap: 9 (ref 5–15)
BUN: 12 mg/dL (ref 8–23)
CO2: 27 mmol/L (ref 22–32)
Calcium: 8.9 mg/dL (ref 8.9–10.3)
Chloride: 100 mmol/L (ref 98–111)
Creatinine, Ser: 0.64 mg/dL (ref 0.44–1.00)
GFR calc Af Amer: >60 mL/min (ref >60)
GFR calc non Af Amer: >60 mL/min (ref >60)
Glucose, Bld: 204 mg/dL — ABNORMAL HIGH (ref 70–99)
Potassium: 3.2 mmol/L — ABNORMAL LOW (ref 3.5–5.1)
Sodium: 136 mmol/L (ref 135–145)
Total Bilirubin: 0.7 mg/dL (ref 0.3–1.2)
Total Protein: 6.8 g/dL (ref 6.5–8.1)

## 2018-05-24 MED ORDER — DOXORUBICIN HCL CHEMO IV INJECTION 2 MG/ML
60.0000 mg/m2 | Freq: Once | INTRAVENOUS | Status: AC
  Administered 2018-05-24: 136 mg via INTRAVENOUS
  Filled 2018-05-24: qty 68

## 2018-05-24 MED ORDER — CHLORHEXIDINE GLUCONATE 0.12 % MT SOLN: 15.0000 mL | Freq: Two times a day (BID) | OROMUCOSAL | 1 refills | Status: DC

## 2018-05-24 MED ORDER — HEPARIN SOD (PORK) LOCK FLUSH 100 UNIT/ML IV SOLN
500.0000 [IU] | Freq: Once | INTRAVENOUS | Status: AC | PRN
  Administered 2018-05-24: 500 [IU]
  Filled 2018-05-24: qty 5

## 2018-05-24 MED ORDER — PALONOSETRON HCL INJECTION 0.25 MG/5ML
0.2500 mg | Freq: Once | INTRAVENOUS | Status: AC
  Administered 2018-05-24: 0.25 mg via INTRAVENOUS
  Filled 2018-05-24: qty 5

## 2018-05-24 MED ORDER — SODIUM CHLORIDE 0.9 % IV SOLN
Freq: Once | INTRAVENOUS | Status: AC
  Administered 2018-05-24: 11:00:00 via INTRAVENOUS
  Filled 2018-05-24: qty 250

## 2018-05-24 MED ORDER — POTASSIUM CHLORIDE CRYS ER 20 MEQ PO TBCR: 20.0000 meq | EXTENDED_RELEASE_TABLET | Freq: Every day | ORAL | 0 refills | Status: DC

## 2018-05-24 MED ORDER — SODIUM CHLORIDE 0.9% FLUSH
10.0000 mL | Freq: Once | INTRAVENOUS | Status: AC
  Administered 2018-05-24: 09:00:00 10 mL via INTRAVENOUS
  Filled 2018-05-24: qty 10

## 2018-05-24 MED ORDER — SODIUM CHLORIDE 0.9 % IV SOLN
600.0000 mg/m2 | Freq: Once | INTRAVENOUS | Status: AC
  Administered 2018-05-24: 1360 mg via INTRAVENOUS
  Filled 2018-05-24: qty 50

## 2018-05-24 MED ORDER — PEGFILGRASTIM 6 MG/0.6ML ~~LOC~~ PSKT
6.0000 mg | PREFILLED_SYRINGE | Freq: Once | SUBCUTANEOUS | Status: AC
  Administered 2018-05-24: 6 mg via SUBCUTANEOUS
  Filled 2018-05-24: qty 0.6

## 2018-05-24 MED ORDER — SODIUM CHLORIDE 0.9 % IV SOLN
Freq: Once | INTRAVENOUS | Status: AC
  Administered 2018-05-24: 12:00:00 via INTRAVENOUS
  Filled 2018-05-24: qty 5

## 2018-05-24 NOTE — Progress Notes (Signed)
Patient made aware that potassium supplement was sent to pharmacy. She was instructed to take the tablet with a full glass of water and food to avoid gastritis.

## 2018-05-25 ENCOUNTER — Telehealth: Payer: Self-pay | Admitting: General Surgery

## 2018-05-25 DIAGNOSIS — Z95828 Presence of other vascular implants and grafts: Secondary | ICD-10-CM | POA: Insufficient documentation

## 2018-05-25 DIAGNOSIS — Z7189 Other specified counseling: Secondary | ICD-10-CM | POA: Insufficient documentation

## 2018-05-25 LAB — CANCER ANTIGEN 27.29: CA 27.29: 16.9 U/mL (ref 0.0–38.6)

## 2018-05-25 LAB — CANCER ANTIGEN 15-3: CA 15-3: 21.7 U/mL (ref 0.0–25.0)

## 2018-05-25 NOTE — Progress Notes (Signed)
Hematology/Oncology progress note [facility] Telephone:([phone] [phone]   Patient Care Team: Idelle Crouch, MD as PCP - General (Internal Medicine) Bary Castilla Forest Gleason, MD as Consulting Physician (General Surgery)  REFERRING PROVIDER: Idelle Crouch, MD REASON FOR VISIT:  Follow up for adjuvant chemotherapy for breast cancer.   HISTORY OF PRESENTING ILLNESS:  Megan Harris is a  68 y.o.  female with PMH listed below who was referred to me for evaluation of breast cancer  Patient had screening mammogram on 03/27/2018 which showed left breast mass. She had diagnostic and ultrasound on 04/07/2018 which showed 0.9cm x 0.9cm x 1.4cm irregular border mixed echotexture mass at the left breast 11 o'clock 7 cm from nipple correlating to the mammographic finding. Ultrasound of the left axilla is negative She underwent biopsy of left breast mass.   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 Estrogen receptor negative, progesterone receptor negative, HER-2 negative.  Nipple discharge: Denies Family history: Paternal grandmother had breast cancer, mother deceased from brain cancer, sister deceased from bone cancer, maternal uncle had colon cancer. OCP use: Short-term use remotely Estrogen and progesterone therapy: denies History of radiation to chest: denies.  Previous breast surgery: Biopsy of left breast in early 90s.  Negative per patient.  History of complete hysterectomy due to endometriosis.  05/09/2018 Baseline MUGA showed no focal wall motion abnormality of the left ventricle.  Calculated LVEF 61%  INTERVAL HISTORY Megan Harris is a 68 y.o. 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  Problems and complaints are  listed below: During the interval, patient underwent left lumpectomy and sentinel lymph node biopsy. Pathology showed Stage IB, Tripe negative breast cancer, grade 3, negative margin.  Neoadjuvant chemotherapy was not given due to COVID-19.  She tolerate procedure well and has been evaluated by Dr.Byrnett.  Would healing is satisfactorily.  She has also had medi port placed.  Patient has taken chemotherapy class.  Today she reports feeling well. No pain or other concerns at her surgery sites.  She has had anti emetics at home and understands the instruction.  Denies any fever, chills, chest pain, abdominal pain or leg swelling.    Review of Systems  Constitutional: Negative for appetite change, chills, fatigue and fever.  HENT:   Negative for hearing loss and voice change.   Eyes: Negative for eye problems.  Respiratory: Negative for chest tightness and cough.   Cardiovascular: Negative for chest pain.  Gastrointestinal: Negative for abdominal distention, abdominal pain and blood in stool.  Endocrine: Negative for hot flashes.  Genitourinary: Negative for difficulty urinating and frequency.   Musculoskeletal: Negative for arthralgias.  Skin: Negative for itching and rash.  Neurological: Negative for extremity weakness.  Hematological: Negative for adenopathy.  Psychiatric/Behavioral: Negative for confusion. The patient is not nervous/anxious.     MEDICAL HISTORY:  Past Medical History:  Diagnosis Date  . Abdominal pain, left lower quadrant 2012  . Anemia    vitamin d deficiency  . Arthritis   . Asthma   . Back pain 2005   lower back arthritis  . Cancer ([place]) 04/26/2018   88m, T1c, N0 triple negative  . Chronic cholecystitis 2012   has had choleycystectomy.  not an issue  . Constipation 2005  . Endocrine problem 2005  . Family history of  bone cancer   . Family history of brain cancer   . Family history of colon cancer   . Family history of lung cancer   . GERD  (gastroesophageal reflux disease) 2012  . Hyperlipidemia 2012  . Hypertension 2002  . Nausea with vomiting 2012  . Obesity, unspecified 2012  . Pneumonia   . PONV (postoperative nausea and vomiting)   . Special screening for malignant neoplasms, colon 2012    SURGICAL HISTORY: Past Surgical History:  Procedure Laterality Date  . BILATERAL CARPAL TUNNEL RELEASE Bilateral   . BREAST BIOPSY Left 04/13/2018   Pending Path  . BREAST EXCISIONAL BIOPSY Left early 90s   neg  . BREAST LUMPECTOMY Left 04/26/2018   Procedure: BREAST LUMPECTOMY WITH EXCISION OF SENTINEL NODE;  Surgeon: Robert Bellow, MD;  Location: [facility] ORS;  Service: General;  Laterality: Left;  . BUNIONECTOMY Bilateral 2003  . CHOLECYSTECTOMY  06/04/2010  . COLON RESECTION  2004   due diverticulitis   . COLONOSCOPY  2005   [place], Dr. Bary Castilla  . COLONOSCOPY  06/28/2012  . COLOSTOMY  2004  . COLOSTOMY REVERSAL  2004  . FOOT SURGERY Bilateral 2012   plantar faciatis  . HAND SURGERY Bilateral    carpal tunnel  . HERNIA REPAIR  2005   at colostomy site after reversal done  . JOINT REPLACEMENT    . KNEE ARTHROPLASTY Right 04/20/2017   Procedure: COMPUTER ASSISTED TOTAL KNEE ARTHROPLASTY;  Surgeon: Dereck Leep, MD;  Location: [facility] ORS;  Service: Orthopedics;  Laterality: Right;  . KNEE ARTHROSCOPY Right 02/03/2015   Procedure: ARTHROSCOPY right knee, partial medial menisectomy, condyle malleolus, patella and femoral;  Surgeon: Dereck Leep, MD;  Location: [facility] ORS;  Service: Orthopedics;  Laterality: Right;  . NASAL SINUS SURGERY     2005  . PORTACATH PLACEMENT Right 04/26/2018   Procedure: INSERTION PORT-A-CATH RIGHT;  Surgeon: Robert Bellow, MD;  Location: [facility] ORS;  Service: General;  Laterality: Right;  . SALPINGOOPHORECTOMY  1998  . TONSILLECTOMY    . TUBAL LIGATION  1978  . UPPER GI ENDOSCOPY  06/28/2012  . VAGINAL HYSTERECTOMY  1987    SOCIAL HISTORY: Social History   Socioeconomic History  .  Marital status: Married    Spouse name: Francee Piccolo  . Number of children: 2  . Years of education: Not on file  . Highest education level: Not on file  Occupational History  . 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 Financial resource strain: Not on file  . Food insecurity:    Worry: Not on file    Inability: Not on file  . Transportation needs:    Medical: Not on file    Non-medical: Not on file  Tobacco Use  . Smoking status: Never Smoker  . Smokeless tobacco: Never Used  Substance and Sexual Activity  . Alcohol use: Yes    Comment: socially  . Drug use: No  . Sexual activity: Yes    Birth control/protection: Surgical  Lifestyle  . Physical activity:    Days per week: Not on file    Minutes per session: Not on file  . Stress: Not on file  Relationships  . Social connections:    Talks on phone: Not on file    Gets together: Not on file    Attends religious service: Not on file    Active member of club or organization: Not on file  Attends meetings of clubs or organizations: Not on file    Relationship status: Not on file  . Intimate partner violence:    Fear of current or ex partner: Not on file    Emotionally abused: Not on file    Physically abused: Not on file    Forced sexual activity: Not on file  Other Topics Concern  . Not on file  Social History Narrative  . Not on file   Lives at home with husband.  FAMILY HISTORY: Family History  Problem Relation Age of Onset  . Heart disease Father   . Diabetes Paternal Grandmother   . Diabetes Paternal Grandfather   . Brain cancer Mother 82  . Bone cancer Sister 33  . Colon cancer Maternal Uncle        dx 50s-60s  . Throat cancer Cousin   . Leukemia Cousin   . Lung cancer Cousin   . Ovarian cancer Neg Hx     ALLERGIES:  is allergic to codeine and penicillins.  MEDICATIONS:  Current Outpatient Medications  Medication Sig Dispense Refill  . albuterol (PROVENTIL  HFA;VENTOLIN HFA) 108 (90 Base) MCG/ACT inhaler Inhale 2 puffs into the lungs every 6 (six) hours as needed for wheezing or shortness of breath.     Marland Kitchen amLODipine (NORVASC) 5 MG tablet Take 5 mg by mouth daily.    Marland Kitchen atorvastatin (LIPITOR) 10 MG tablet Take 10 mg by mouth daily.    . Cholecalciferol (VITAMIN D3 MAXIMUM STRENGTH) 5000 units capsule Take 5,000 Units by mouth daily.    Marland Kitchen dextromethorphan-guaiFENesin (MUCINEX DM) 30-600 MG 12hr tablet Take 1 tablet by mouth daily as needed for cough.    . gabapentin (NEURONTIN) 300 MG capsule Take 300 mg by mouth 3 (three) times daily.    . hydrochlorothiazide (HYDRODIURIL) 25 MG tablet Take 25 mg by mouth daily.    . lansoprazole (PREVACID) 30 MG capsule Take 30 mg by mouth 2 (two) times daily.     Marland Kitchen levothyroxine (SYNTHROID, LEVOTHROID) 50 MCG tablet Take 50 mcg by mouth daily before breakfast.     . lidocaine-prilocaine (EMLA) cream Apply to affected area once 30 g 3  . losartan (COZAAR) 100 MG tablet Take 100 mg by mouth daily.    . ondansetron (ZOFRAN) 8 MG tablet Take 1 tablet (8 mg total) by mouth 2 (two) times daily as needed. Start on the third day after chemotherapy. 30 tablet 1  . prochlorperazine (COMPAZINE) 10 MG tablet Take 1 tablet (10 mg total) by mouth every 6 (six) hours as needed (Nausea or vomiting). 30 tablet 1  . zolpidem (AMBIEN) 10 MG tablet Take 10 mg by mouth at bedtime.     Marland Kitchen acetaminophen (TYLENOL) 500 MG tablet Take 500 mg by mouth every 6 (six) hours as needed.    Marland Kitchen aspirin EC 81 MG tablet Take 81 mg by mouth daily.    . chlorhexidine (PERIDEX) 0.12 % solution Use as directed 15 mLs in the mouth or throat 2 (two) times daily. 473 mL 1  . potassium chloride SA (K-DUR) 20 MEQ tablet Take 1 tablet (20 mEq total) by mouth daily. 3 tablet 0   No current facility-administered medications for this visit.      PHYSICAL EXAMINATION: ECOG PERFORMANCE STATUS: 0 - Asymptomatic Vitals:   05/24/18 0943  BP: (!) 148/90  Pulse: 68   Resp: 20  Temp: (!) 97.4 F (36.3 C)   Filed Weights   05/24/18 0943  Weight: 248 lb 8  oz (112.7 kg)    Physical Exam Constitutional:      General: She is not in acute distress.    Appearance: She is obese.  HENT:     Head: Normocephalic and atraumatic.  Eyes:     General: No scleral icterus.    Pupils: Pupils are equal, round, and reactive to light.  Neck:     Musculoskeletal: Normal range of motion and neck supple.  Cardiovascular:     Rate and Rhythm: Normal rate and regular rhythm.     Heart sounds: Normal heart sounds.  Pulmonary:     Effort: Pulmonary effort is normal. No respiratory distress.     Breath sounds: No wheezing.  Abdominal:     General: Bowel sounds are normal. There is no distension.     Palpations: Abdomen is soft. There is no mass.     Tenderness: There is no abdominal tenderness.  Musculoskeletal: Normal range of motion.        General: No deformity.  Skin:    General: Skin is warm and dry.     Findings: No erythema or rash.  Neurological:     Mental Status: She is alert and oriented to person, place, and time.     Cranial Nerves: No cranial nerve deficit.     Coordination: Coordination normal.  Psychiatric:        Behavior: Behavior normal.        Thought Content: Thought content normal.   Breast exam was performed in seated and lying down position. Patient is status post left lumpectomy with a well-healed surgical scar. No evidence of any palpable masses. No evidence of left axillary adenopathy.  No evidence of any palpable masses or lumps in the right breast. No evidence of right axillary adenopathy    LABORATORY DATA:  I have reviewed the data as listed Lab Results  Component Value Date   WBC 5.9 05/24/2018   HGB 13.0 05/24/2018   HCT 39.5 05/24/2018   MCV 87.6 05/24/2018   PLT 172 05/24/2018   Recent Labs    05/24/18 0908  NA 136  K 3.2*  CL 100  CO2 27  GLUCOSE 204*  BUN 12  CREATININE 0.64  CALCIUM 8.9  GFRNONAA >60   GFRAA >60  PROT 6.8  ALBUMIN 3.6  AST 52*  ALT 60*  ALKPHOS 75  BILITOT 0.7   Iron/TIBC/Ferritin/ %Sat No results found for: IRON, TIBC, FERRITIN, IRONPCTSAT   RADIOGRAPHIC STUDIES: I have personally reviewed the radiological images as listed and agreed with the findings in the report. 04/07/2018 Unilateral left breast diagnostic mammogram.  Spot compression CC and MLO views of the left breast are submitted. The previously questioned mass in the upper slight medial left breast persists on additional views. Targeted ultrasound is performed, showing a 0.9 x 0.9 x 1.4 cm irregular border mixed echotexture mass at the left breast 11 o'clock 7 cm from nipple correlating to the mammographic finding. Ultrasound of the left axilla is negative. IMPRESSION: Suspicious findings.  Pathology 04/26/2018 CASE: [id number]  PATIENT: Clovis Cao  Surgical Pathology Report DIAGNOSIS:  A. BREAST, LEFT UPPER INNER QUADRANT; WIDE EXCISION:  - INVASIVE MAMMARY CARCINOMA.  - SEE CANCER SUMMARY BELOW.  - BIOPSY SITE CHANGE WITH HEMATOMA AND RIBBON CLIP.  - UNREMARKABLE SKIN.  B. SENTINEL LYMPH NODE #1; EXCISION:  - ONE LYMPH NODE NEGATIVE FOR MALIGNANCY (0/1).  C. NEW BREAST MARGIN, SUPERIOR; EXCISION:  - BENIGN BREAST TISSUE WITH BIOPSY CHANGE AND HEMATOMA.  - NEGATIVE  FOR MALIGNANCY.  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, without comedonecrosis  Margins:    Invasive Carcinoma Margins: Uninvolved by invasive carcinoma             Distance from closest margin: 1.7 mm            Specify closest margin: Anterior       DCIS Margins: Uninvolved by DCIS            Distance from closest margin: 1.2 mm            Specify closest margin: Anterior  Regional Lymph Nodes: Uninvolved by tumor cells    Total Number of Lymph Nodes Examined: 1       Number of Sentinel Nodes Examined: 1  Treatment Effect in the Breast: No known presurgical therapy  Lymphovascular Invasion: Not identified  Pathologic Stage Classification (pTNM, AJCC 8th Edition): pT1c pN0 (sn)   Breast Biomarker Testing Performed on Previous Biopsy: [id number]  Estrogen Receptor (ER) Status: NEGATIVE  Progesterone Receptor (PgR) Status: NEGATIVE  HER2 (by immunohistochemistry): NEGATIVE     ASSESSMENT & PLAN:  1. 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 ([place])   2. Port-A-Cath in place   3. Goals of care, counseling/discussion   4. Hypokalemia   5. Encounter for antineoplastic chemotherapy   Cancer Staging 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 ([place]) Staging form: Breast, AJCC 8th Edition - Clinical stage from 04/28/2018: Stage IB (cT1c, cN0, cM0, G3, ER-, PR-, HER2-) - Signed by Earlie Server, MD on 04/29/2018 - Pathologic stage from 05/25/2018: Stage IB (pT1c, pN0(sn), cM0, G3, ER-, PR-, HER2-) - Signed by Earlie Server, MD on 05/25/2018  S/p left lumpectomy and sentinel lymph node biopsy.  Stage IB Triple negative breast cancer Pathology was reviewed and discussed with patient.   The diagnosis and care plan were discussed with patient in detail.  NCCN guidelines were reviewed and shared with patient.  The goal of treatment which is to cure disease.  Chemotherapy education was provided.  We had discussed the composition of chemotherapy regimen, length of chemo cycle, duration of treatment and the time to assess response to treatment.   Recommend adjuvant chemotherapy with DD AC x4 followed by weekly Taxol x12.   I explained to the patient the risks and benefits of chemotherapy including all but not limited to hair loss, mouth sore, nausea, vomiting, low blood counts, bleeding, cardiac toxicities, and risk of life threatening infection and even death, secondary malignancy, neuropathy, etc.  Patient voices understanding and willing to proceed chemotherapy. She will proceed with cycle 1 ddAC today.   Growth factor-Onpro Neulasta would be given as chemotherapy-induced neutropenia to prevent febrile neutropenias. Discussed potential side effect- myalgias/arthralgias- recommend Claritin for 4 days.   # Supportive care measures are necessary for patient well-being and will be provided as necessary. We spent sufficient time to discuss many aspect of care, questions were answered to patient's satisfaction.  Given  the fact of triple negative breast cancer, family history of brain cancer, colon cancer, breast cancer and bone cancer, I highly recommend genetic testing. She has been referred to counselor and had discussion. She wishes not to pursue with genetic testing.   Hypokalemia, K 3.2, recommend patient to start on potassium 73mq daily x 3 days   All questions were answered. The patient knows to call the clinic with any problems questions or concerns.  ZEarlie Server MD, PhD Hematology Oncology [facility]yler [facility] Hospitalat AAnchorage [facility]oscopy Center LLCPager- [id number]/07/2018  Return of visit: 1 week Virtual visit to evaluate tolerability.

## 2018-05-25 NOTE — Telephone Encounter (Signed)
Patient to states she wanted to talk to Dr. Bary Castilla about port . Scheduled for 06/01/2018 @ 945am

## 2018-05-25 NOTE — Telephone Encounter (Signed)
Patient is calling just has some questions. Please call patient and advise.

## 2018-05-30 ENCOUNTER — Other Ambulatory Visit: Payer: Self-pay

## 2018-05-30 ENCOUNTER — Encounter: Payer: Self-pay | Admitting: General Surgery

## 2018-05-30 ENCOUNTER — Ambulatory Visit (INDEPENDENT_AMBULATORY_CARE_PROVIDER_SITE_OTHER): Payer: PPO | Admitting: General Surgery

## 2018-05-30 VITALS — BP 150/86 | HR 78 | Temp 97.7°F | Ht 65.0 in | Wt 245.0 lb

## 2018-05-30 DIAGNOSIS — Z171 Estrogen receptor negative status [ER-]: Secondary | ICD-10-CM

## 2018-05-30 DIAGNOSIS — C50212 Malignant neoplasm of upper-inner quadrant of left female breast: Secondary | ICD-10-CM

## 2018-05-30 NOTE — Patient Instructions (Signed)
Return as schedule The patient is aware to call back for any questions or concerns.

## 2018-05-30 NOTE — Progress Notes (Signed)
Patient ID: Megan Harris, female   DOB: September 27, 1950, 68 y.o.   MRN: [id number]  Chief Complaint  Patient presents with  . Follow-up    HPI Megan Harris is a 68 y.o. female here today to discuss questions about her port.  HPI  Past Medical History:  Diagnosis Date  . Abdominal pain, left lower quadrant 2012  . Anemia    vitamin d deficiency  . Arthritis   . Asthma   . Back pain 2005   lower back arthritis  . Cancer ([place]) 04/26/2018   34mm, T1c, N0 triple negative  . Chronic cholecystitis 2012   has had choleycystectomy.  not an issue  . Constipation 2005  . Endocrine problem 2005  . Family history of bone cancer   . Family history of brain cancer   . Family history of colon cancer   . Family history of lung cancer   . GERD (gastroesophageal reflux disease) 2012  . Hyperlipidemia 2012  . Hypertension 2002  . Nausea with vomiting 2012  . Obesity, unspecified 2012  . Pneumonia   . PONV (postoperative nausea and vomiting)   . Special screening for malignant neoplasms, colon 2012    Past Surgical History:  Procedure Laterality Date  . BILATERAL CARPAL TUNNEL RELEASE Bilateral   . BREAST BIOPSY Left 04/13/2018   Pending Path  . BREAST EXCISIONAL BIOPSY Left early 90s   neg  . BREAST LUMPECTOMY Left 04/26/2018   Procedure: BREAST LUMPECTOMY WITH EXCISION OF SENTINEL NODE;  Surgeon: Robert Bellow, MD;  Location: [facility] ORS;  Service: General;  Laterality: Left;  . BUNIONECTOMY Bilateral 2003  . CHOLECYSTECTOMY  06/04/2010  . COLON RESECTION  2004   due diverticulitis   . COLONOSCOPY  2005   [place], Dr. Bary Castilla  . COLONOSCOPY  06/28/2012  . COLOSTOMY  2004  . COLOSTOMY REVERSAL  2004  . FOOT SURGERY Bilateral 2012   plantar faciatis  . HAND SURGERY Bilateral    carpal tunnel  . HERNIA REPAIR  2005   at colostomy site after reversal done  . JOINT REPLACEMENT    . KNEE ARTHROPLASTY Right 04/20/2017   Procedure: COMPUTER ASSISTED TOTAL KNEE ARTHROPLASTY;  Surgeon:  Dereck Leep, MD;  Location: [facility] ORS;  Service: Orthopedics;  Laterality: Right;  . KNEE ARTHROSCOPY Right 02/03/2015   Procedure: ARTHROSCOPY right knee, partial medial menisectomy, condyle malleolus, patella and femoral;  Surgeon: Dereck Leep, MD;  Location: [facility] ORS;  Service: Orthopedics;  Laterality: Right;  . NASAL SINUS SURGERY     2005  . PORTACATH PLACEMENT Right 04/26/2018   Procedure: INSERTION PORT-A-CATH RIGHT;  Surgeon: Robert Bellow, MD;  Location: [facility] ORS;  Service: General;  Laterality: Right;  . SALPINGOOPHORECTOMY  1998  . TONSILLECTOMY    . TUBAL LIGATION  1978  . UPPER GI ENDOSCOPY  06/28/2012  . VAGINAL HYSTERECTOMY  1987    Family History  Problem Relation Age of Onset  . Heart disease Father   . Diabetes Paternal Grandmother   . Diabetes Paternal Grandfather   . Brain cancer Mother 21  . Bone cancer Sister 58  . Colon cancer Maternal Uncle        dx 50s-60s  . Throat cancer Cousin   . Leukemia Cousin   . Lung cancer Cousin   . Ovarian cancer Neg Hx     Social History Social History   Tobacco Use  . Smoking status: Never Smoker  . Smokeless tobacco: Never Used  Substance Use Topics  . Alcohol use: Yes    Comment: socially  . Drug use: No    Allergies  Allergen Reactions  . Codeine Other (See Comments)    Goes to sleep and unable to wake up.  Marland Kitchen Penicillins Hives    Did it involve swelling of the face/tongue/throat, SOB, or low BP? No Did it involve sudden or severe rash/hives, skin peeling, or any reaction on the inside of your mouth or nose? No Did you need to seek medical attention at a hospital or doctor's office? Yes When did it last happen?50s If all above answers are "NO", may proceed with cephalosporin use.     Current Outpatient Medications  Medication Sig Dispense Refill  . acetaminophen (TYLENOL) 500 MG tablet Take 500 mg by mouth every 6 (six) hours as needed.    Marland Kitchen albuterol (PROVENTIL HFA;VENTOLIN HFA) 108 (90  Base) MCG/ACT inhaler Inhale 2 puffs into the lungs every 6 (six) hours as needed for wheezing or shortness of breath.     Marland Kitchen amLODipine (NORVASC) 5 MG tablet Take 5 mg by mouth daily.    Marland Kitchen aspirin EC 81 MG tablet Take 81 mg by mouth daily.    Marland Kitchen atorvastatin (LIPITOR) 10 MG tablet Take 10 mg by mouth daily.    . chlorhexidine (PERIDEX) 0.12 % solution Use as directed 15 mLs in the mouth or throat 2 (two) times daily. 473 mL 1  . Cholecalciferol (VITAMIN D3 MAXIMUM STRENGTH) 5000 units capsule Take 5,000 Units by mouth daily.    Marland Kitchen dextromethorphan-guaiFENesin (MUCINEX DM) 30-600 MG 12hr tablet Take 1 tablet by mouth daily as needed for cough.    . gabapentin (NEURONTIN) 300 MG capsule Take 300 mg by mouth 3 (three) times daily.    . hydrochlorothiazide (HYDRODIURIL) 25 MG tablet Take 25 mg by mouth daily.    . lansoprazole (PREVACID) 30 MG capsule Take 30 mg by mouth 2 (two) times daily.     Marland Kitchen levothyroxine (SYNTHROID, LEVOTHROID) 50 MCG tablet Take 50 mcg by mouth daily before breakfast.     . lidocaine-prilocaine (EMLA) cream Apply to affected area once 30 g 3  . losartan (COZAAR) 100 MG tablet Take 100 mg by mouth daily.    . ondansetron (ZOFRAN) 8 MG tablet Take 1 tablet (8 mg total) by mouth 2 (two) times daily as needed. Start on the third day after chemotherapy. 30 tablet 1  . potassium chloride SA (K-DUR) 20 MEQ tablet Take 1 tablet (20 mEq total) by mouth daily. 3 tablet 0  . prochlorperazine (COMPAZINE) 10 MG tablet Take 1 tablet (10 mg total) by mouth every 6 (six) hours as needed (Nausea or vomiting). 30 tablet 1  . zolpidem (AMBIEN) 10 MG tablet Take 10 mg by mouth at bedtime.      No current facility-administered medications for this visit.     Review of Systems Review of Systems  Constitutional: Negative.   Respiratory: Negative.   Cardiovascular: Negative.     Blood pressure (!) 150/86, pulse 78, temperature 97.7 F (36.5 C), temperature source Skin, height 5\' 5"  (1.651 m),  weight 245 lb (111.1 kg), SpO2 98 %.  Physical Exam Physical Exam Exam conducted with a chaperone present.  Chest:         Assessment Good positioning of PowerPort.  Plan  Return as schedule The patient is aware to call back for any questions or concerns.  If there is difficulty with access at the time of chemotherapy next week,  the patient was encouraged to have the oncology staff call me for evaluation and access.  HPI, Physical Exam, Assessment and Plan have been scribed under the direction and in the presence of Robert Bellow, MD. Jonnie Finner, CMA  I have completed the exam and reviewed the above documentation for accuracy and completeness.  I agree with the above.  Haematologist has been used and any errors in dictation or transcription are unintentional.  Hervey Ard, M.D., F.A.C.S.  Forest Gleason Conya Ellinwood 05/31/2018, 8:06 PM

## 2018-05-31 ENCOUNTER — Other Ambulatory Visit: Payer: Self-pay

## 2018-05-31 ENCOUNTER — Inpatient Hospital Stay: Payer: PPO

## 2018-05-31 DIAGNOSIS — Z5111 Encounter for antineoplastic chemotherapy: Secondary | ICD-10-CM | POA: Diagnosis not present

## 2018-05-31 DIAGNOSIS — C50919 Malignant neoplasm of unspecified site of unspecified female breast: Secondary | ICD-10-CM

## 2018-05-31 LAB — COMPREHENSIVE METABOLIC PANEL
ALT: 64 U/L — ABNORMAL HIGH (ref 0–44)
AST: 50 U/L — ABNORMAL HIGH (ref 15–41)
Albumin: 3.9 g/dL (ref 3.5–5.0)
Alkaline Phosphatase: 83 U/L (ref 38–126)
Anion gap: 9 (ref 5–15)
BUN: 14 mg/dL (ref 8–23)
CO2: 31 mmol/L (ref 22–32)
Calcium: 9 mg/dL (ref 8.9–10.3)
Chloride: 97 mmol/L — ABNORMAL LOW (ref 98–111)
Creatinine, Ser: 0.95 mg/dL (ref 0.44–1.00)
GFR calc Af Amer: >60 mL/min (ref >60)
GFR calc non Af Amer: >60 mL/min (ref >60)
Glucose, Bld: 123 mg/dL — ABNORMAL HIGH (ref 70–99)
Potassium: 4.3 mmol/L (ref 3.5–5.1)
Sodium: 137 mmol/L (ref 135–145)
Total Bilirubin: 0.6 mg/dL (ref 0.3–1.2)
Total Protein: 7 g/dL (ref 6.5–8.1)

## 2018-05-31 LAB — CBC WITH DIFFERENTIAL/PLATELET
Abs Immature Granulocytes: 0 K/uL (ref 0.00–0.07)
Basophils Absolute: 0 K/uL (ref 0.0–0.1)
Basophils Relative: 1 %
Eosinophils Absolute: 0.1 K/uL (ref 0.0–0.5)
Eosinophils Relative: 11 %
HCT: 38.2 % (ref 36.0–46.0)
Hemoglobin: 12.6 g/dL (ref 12.0–15.0)
Immature Granulocytes: 0 %
Lymphocytes Relative: 71 %
Lymphs Abs: 0.9 K/uL (ref 0.7–4.0)
MCH: 28.9 pg (ref 26.0–34.0)
MCHC: 33 g/dL (ref 30.0–36.0)
MCV: 87.6 fL (ref 80.0–100.0)
Monocytes Absolute: 0.1 K/uL (ref 0.1–1.0)
Monocytes Relative: 7 %
Neutro Abs: 0.1 K/uL — ABNORMAL LOW (ref 1.7–7.7)
Neutrophils Relative %: 10 %
Platelets: 118 K/uL — ABNORMAL LOW (ref 150–400)
RBC: 4.36 MIL/uL (ref 3.87–5.11)
RDW: 13.2 % (ref 11.5–15.5)
Smear Review: NORMAL
WBC: 1.2 K/uL — CL (ref 4.0–10.5)
nRBC: 0 % (ref 0.0–0.2)

## 2018-06-01 ENCOUNTER — Ambulatory Visit: Payer: PPO | Admitting: General Surgery

## 2018-06-01 ENCOUNTER — Inpatient Hospital Stay (HOSPITAL_BASED_OUTPATIENT_CLINIC_OR_DEPARTMENT_OTHER): Payer: PPO | Admitting: Oncology

## 2018-06-01 ENCOUNTER — Encounter: Payer: Self-pay | Admitting: Oncology

## 2018-06-01 DIAGNOSIS — D701 Agranulocytosis secondary to cancer chemotherapy: Secondary | ICD-10-CM | POA: Diagnosis not present

## 2018-06-01 DIAGNOSIS — R110 Nausea: Secondary | ICD-10-CM

## 2018-06-01 DIAGNOSIS — D696 Thrombocytopenia, unspecified: Secondary | ICD-10-CM | POA: Diagnosis not present

## 2018-06-01 DIAGNOSIS — C50919 Malignant neoplasm of unspecified site of unspecified female breast: Secondary | ICD-10-CM

## 2018-06-01 DIAGNOSIS — Z79899 Other long term (current) drug therapy: Secondary | ICD-10-CM | POA: Diagnosis not present

## 2018-06-01 DIAGNOSIS — T451X5A Adverse effect of antineoplastic and immunosuppressive drugs, initial encounter: Secondary | ICD-10-CM

## 2018-06-01 NOTE — Progress Notes (Signed)
HEMATOLOGY-ONCOLOGY TeleHEALTH VISIT PROGRESS NOTE  I connected with Megan Harris on 06/01/18 at  9:00 AM EDT by video enabled telemedicine visit and verified that I am speaking with the correct person using two identifiers. I discussed the limitations, risks, security and privacy concerns of performing an evaluation and management service by telemedicine and the availability of in-person appointments. I also discussed with the patient that there may be a patient responsible charge related to this service. The patient expressed understanding and agreed to proceed.   Other persons participating in the visit and their role in the encounter:  Megan Merl, RN, check in patient.  Patient's daughter Megan Harris and son Megan Harris.  Patient's location: Home  Provider's location: Home office Chief Complaint: Follow-up for evaluation of chemotherapy tolerability.   INTERVAL HISTORY Megan Harris is a 68 y.o. female who has above history reviewed by me today presents for follow up visit for evaluation of chemotherapy tolerability. Problems and complaints are listed below:  Patient underwent chemotherapy dose dense AC 1 week ago. She also received G-CSF support with onpro Neulasta. She has been taking Claritin.  Mild body aches, manageable. She feels nausea, no vomiting.  Takes antiemetics, both Compazine and Zofran as needed. Denies any fever, vomiting, chest pain, abdominal pain, diarrhea.  Review of Systems  Constitutional: Positive for fatigue. Negative for appetite change, chills and fever.  HENT:   Negative for hearing loss and voice change.   Eyes: Negative for eye problems.  Respiratory: Negative for chest tightness and cough.   Cardiovascular: Negative for chest pain.  Gastrointestinal: Negative for abdominal distention, abdominal pain and blood in stool.  Endocrine: Negative for hot flashes.  Genitourinary: Negative for difficulty urinating and frequency.   Musculoskeletal: Negative for  arthralgias.  Skin: Negative for itching and rash.  Neurological: Negative for extremity weakness.  Hematological: Negative for adenopathy.  Psychiatric/Behavioral: Negative for confusion.    Past Medical History:  Diagnosis Date  . Abdominal pain, left lower quadrant 2012  . Anemia    vitamin d deficiency  . Arthritis   . Asthma   . Back pain 2005   lower back arthritis  . Cancer ([place]) 04/26/2018   31mm, T1c, N0 triple negative  . Chronic cholecystitis 2012   has had choleycystectomy.  not an issue  . Constipation 2005  . Endocrine problem 2005  . Family history of bone cancer   . Family history of brain cancer   . Family history of colon cancer   . Family history of lung cancer   . GERD (gastroesophageal reflux disease) 2012  . Hyperlipidemia 2012  . Hypertension 2002  . Nausea with vomiting 2012  . Obesity, unspecified 2012  . Pneumonia   . PONV (postoperative nausea and vomiting)   . Special screening for malignant neoplasms, colon 2012   Past Surgical History:  Procedure Laterality Date  . BILATERAL CARPAL TUNNEL RELEASE Bilateral   . BREAST BIOPSY Left 04/13/2018   Pending Path  . BREAST EXCISIONAL BIOPSY Left early 90s   neg  . BREAST LUMPECTOMY Left 04/26/2018   Procedure: BREAST LUMPECTOMY WITH EXCISION OF SENTINEL NODE;  Surgeon: Robert Bellow, MD;  Location: [facility] ORS;  Service: General;  Laterality: Left;  . BUNIONECTOMY Bilateral 2003  . CHOLECYSTECTOMY  06/04/2010  . COLON RESECTION  2004   due diverticulitis   . COLONOSCOPY  2005   [place], Dr. Bary Castilla  . COLONOSCOPY  06/28/2012  . COLOSTOMY  2004  . COLOSTOMY REVERSAL  2004  .  FOOT SURGERY Bilateral 2012   plantar faciatis  . HAND SURGERY Bilateral    carpal tunnel  . HERNIA REPAIR  2005   at colostomy site after reversal done  . JOINT REPLACEMENT    . KNEE ARTHROPLASTY Right 04/20/2017   Procedure: COMPUTER ASSISTED TOTAL KNEE ARTHROPLASTY;  Surgeon: Dereck Leep, MD;  Location: [facility] ORS;   Service: Orthopedics;  Laterality: Right;  . KNEE ARTHROSCOPY Right 02/03/2015   Procedure: ARTHROSCOPY right knee, partial medial menisectomy, condyle malleolus, patella and femoral;  Surgeon: Dereck Leep, MD;  Location: [facility] OR[facility];  Service: Orthopedics;  Laterality: Right;  . NASAL SINUS SURGERY     2005  . PORTACATH PLACEMENT Right 04/26/2018   Procedure: INSERTION PORT-A-CATH RIGHT;  Surgeon: Robert Bellow, MD;  Location: [facility];  Service: General;  Laterality: Right;  . SALPINGOOPHORECTOMY  1998  . TONSILLECTOMY    . TUBAL LIGATION  1978  . UPPER GI ENDOSCOPY  06/28/2012  . VAGINAL HYSTERECTOMY  1987    Family History  Problem Relation Age of Onset  . Heart disease Father   . Diabetes Paternal Grandmother   . Diabetes Paternal Grandfather   . Brain cancer Mother 46  . Bone cancer Sister 38  . Colon cancer Maternal Uncle        dx 50s-60s  . Throat cancer Cousin   . Leukemia Cousin   . Lung cancer Cousin   . Ovarian cancer Neg Hx     Social History   Socioeconomic History  . Marital status: Married    Spouse name: Francee Piccolo  . Number of children: 2  . Years of education: Not on file  . Highest education level: Not on file  Occupational History  . 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 Financial resource strain: Not on file  . Food insecurity:    Worry: Not on file    Inability: Not on file  . Transportation needs:    Medical: Not on file    Non-medical: Not on file  Tobacco Use  . Smoking status: Never Smoker  . Smokeless tobacco: Never Used  Substance and Sexual Activity  . Alcohol use: Yes    Comment: socially  . Drug use: No  . Sexual activity: Yes    Birth control/protection: Surgical  Lifestyle  . Physical activity:    Days per week: Not on file    Minutes per session: Not on file  . Stress: Not on file  Relationships  . Social connections:    Talks on phone: Not on file    Gets together: Not on file     Attends religious service: Not on file    Active member of club or organization: Not on file    Attends meetings of clubs or organizations: Not on file    Relationship status: Not on file  . Intimate partner violence:    Fear of current or ex partner: Not on file    Emotionally abused: Not on file    Physically abused: Not on file    Forced sexual activity: Not on file  Other Topics Concern  . Not on file  Social History Narrative  . Not on file    Current Outpatient Medications on File Prior to Visit  Medication Sig Dispense Refill  . acetaminophen (TYLENOL) 500 MG tablet Take 500 mg by mouth every 6 (six) hours as needed.    Marland Kitchen albuterol (PROVENTIL HFA;VENTOLIN  HFA) 108 (90 Base) MCG/ACT inhaler Inhale 2 puffs into the lungs every 6 (six) hours as needed for wheezing or shortness of breath.     Marland Kitchen amLODipine (NORVASC) 5 MG tablet Take 5 mg by mouth daily.    Marland Kitchen atorvastatin (LIPITOR) 10 MG tablet Take 10 mg by mouth daily.    . chlorhexidine (PERIDEX) 0.12 % solution Use as directed 15 mLs in the mouth or throat 2 (two) times daily. 473 mL 1  . Cholecalciferol (VITAMIN D3 MAXIMUM STRENGTH) 5000 units capsule Take 5,000 Units by mouth daily.    Marland Kitchen dextromethorphan-guaiFENesin (MUCINEX DM) 30-600 MG 12hr tablet Take 1 tablet by mouth daily as needed for cough.    . gabapentin (NEURONTIN) 300 MG capsule Take 300 mg by mouth 3 (three) times daily.    . hydrochlorothiazide (HYDRODIURIL) 25 MG tablet Take 25 mg by mouth daily.    . lansoprazole (PREVACID) 30 MG capsule Take 30 mg by mouth 2 (two) times daily.     Marland Kitchen levothyroxine (SYNTHROID, LEVOTHROID) 50 MCG tablet Take 50 mcg by mouth daily before breakfast.     . lidocaine-prilocaine (EMLA) cream Apply to affected area once 30 g 3  . losartan (COZAAR) 100 MG tablet Take 100 mg by mouth daily.    . ondansetron (ZOFRAN) 8 MG tablet Take 1 tablet (8 mg total) by mouth 2 (two) times daily as needed. Start on the third day after chemotherapy. 30  tablet 1  . potassium chloride SA (K-DUR) 20 MEQ tablet Take 1 tablet (20 mEq total) by mouth daily. 3 tablet 0  . prochlorperazine (COMPAZINE) 10 MG tablet Take 1 tablet (10 mg total) by mouth every 6 (six) hours as needed (Nausea or vomiting). 30 tablet 1  . zolpidem (AMBIEN) 10 MG tablet Take 10 mg by mouth at bedtime.      No current facility-administered medications on file prior to visit.     Allergies  Allergen Reactions  . Codeine Other (See Comments)    Goes to sleep and unable to wake up.  Marland Kitchen Penicillins Hives    Did it involve swelling of the face/tongue/throat, SOB, or low BP? No Did it involve sudden or severe rash/hives, skin peeling, or any reaction on the inside of your mouth or nose? No Did you need to seek medical attention at a hospital or doctor's office? Yes When did it last happen?50s If all above answers are "NO", may proceed with cephalosporin use.        Observations/Objective: There were no vitals filed for this visit. There is no height or weight on file to calculate BMI.  Pain 0 Physical Exam  Constitutional: She is oriented to person, place, and time. No distress.  HENT:  Head: Normocephalic and atraumatic.  Pulmonary/Chest: Effort normal.  Neurological: She is alert and oriented to person, place, and time.  Psychiatric: Affect normal.    CBC    Component Value Date/Time   WBC 1.2 (LL) 05/31/2018 1427   RBC 4.36 05/31/2018 1427   HGB 12.6 05/31/2018 1427   HCT 38.2 05/31/2018 1427   PLT 118 (L) 05/31/2018 1427   MCV 87.6 05/31/2018 1427   MCH 28.9 05/31/2018 1427   MCHC 33.0 05/31/2018 1427   RDW 13.2 05/31/2018 1427   LYMPHSABS 0.9 05/31/2018 1427   MONOABS 0.1 05/31/2018 1427   EOSABS 0.1 05/31/2018 1427   BASOSABS 0.0 05/31/2018 1427    CMP     Component Value Date/Time   NA 137 05/31/2018 1427  K 4.3 05/31/2018 1427   CL 97 (L) 05/31/2018 1427   CO2 31 05/31/2018 1427   GLUCOSE 123 (H) 05/31/2018 1427   BUN 14  05/31/2018 1427   CREATININE 0.95 05/31/2018 1427   CALCIUM 9.0 05/31/2018 1427   PROT 7.0 05/31/2018 1427   ALBUMIN 3.9 05/31/2018 1427   AST 50 (H) 05/31/2018 1427   ALT 64 (H) 05/31/2018 1427   ALKPHOS 83 05/31/2018 1427   BILITOT 0.6 05/31/2018 1427   GFRNONAA >60 05/31/2018 1427   GFRAA >60 05/31/2018 1427     Assessment and Plan: 1. 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 Chemotherapy-induced neutropenia ([facility])   3. Thrombocytopenia ([place])   4. Nausea without vomiting     Labs reviewed and discussed with patient. Overall she tolerates cycle 1 dose dense AC.  She will proceed with cycle 2 dose dense AC in 1 week.  She will have labs done prior to the immunotherapy.  Chemotherapy-induced neutropenia, neutropenia precaution discussed with patient. She has received G-CSF support with onpro Neulasta. 05/31/2018, ANC 0.1.  Thrombocytopenia, chemotherapy-induced, continue to monitor.  Nausea without vomiting, continue antiemetics as instructed.  Follow Up Instructions: 1 week for lab and MD assessment prior to cycle 2 dose dense AC   I discussed the assessment and treatment plan with the patient. The patient was provided an opportunity to ask questions and all were answered. The patient agreed with the plan and demonstrated an understanding of the instructions.  The patient was advised to call back or seek an in-person evaluation if the symptoms worsen or if the condition fails to improve as anticipated.   I provided 25 minutes of face-to-face video visit time during this encounter, and > 50% was spent counseling as documented under my assessment & plan.  Earlie Server, MD 06/01/2018 3:43 PM

## 2018-06-01 NOTE — Progress Notes (Signed)
Patient contacted for telehealth visit. She complains of mild nausea that is relieved with PRN antiemetics

## 2018-06-02 ENCOUNTER — Encounter: Payer: Self-pay | Admitting: *Deleted

## 2018-06-02 NOTE — Progress Notes (Signed)
Left message with recommendation from Dr. Tasia Catchings to try IBU 600 mg every 8 hours and if no better to contact her primary care for evaluation and possible referral to physical therapy and and opioid prescription.

## 2018-06-02 NOTE — Progress Notes (Signed)
  Oncology Nurse Navigator Documentation  Navigator Location: [facility] (06/02/18 0800)   )Navigator Encounter Type: Telephone (06/02/18 0800) Telephone: Incoming Call (06/02/18 0800)                     Treatment Phase: Treatment (06/02/18 0800) Barriers/Navigation Needs: Coordination of Care (06/02/18 0800) Education: Pain/ Symptom Management (06/02/18 0800)                        Time Spent with Patient: 30 (06/02/18 0800)   Patient called and complains of lower back pain across her buttocks and radiating a little down the right leg.  Rates pain a 9/10.  States it worse when she sits, and is having to walk with a limp.  States relief when lying down.  She is using her Gabapentin and 1 Tylenol.  Message sent to Dr. Tasia Catchings for recommendation.

## 2018-06-04 NOTE — Progress Notes (Signed)
  Oncology Nurse Navigator Documentation  Navigator Location: [facility] (06/04/18 1300)   )Navigator Encounter Type: Telephone (06/04/18 1300) Telephone: Incoming Call;Symptom Mgt (06/04/18 1300)                   Patient Visit Type: Follow-up (06/04/18 1300) Treatment Phase: Treatment (06/04/18 1300)                            Time Spent with Patient: 30 (06/04/18 1300)   Patient states she is developing "fever blisters"  Asks if okay to take L-Lysine which has worked in past.  Message sent to Dr. Tasia Catchings, and approval received.  Patient states he back is feeling much better since she talked to [place] on Friday!

## 2018-06-06 ENCOUNTER — Other Ambulatory Visit: Payer: Self-pay

## 2018-06-06 ENCOUNTER — Telehealth: Payer: Self-pay | Admitting: *Deleted

## 2018-06-06 NOTE — Telephone Encounter (Signed)
Patient requesting last weeks lab results be released to My Chart so that she can see them.

## 2018-06-06 NOTE — Telephone Encounter (Signed)
Results released as requested.

## 2018-06-07 ENCOUNTER — Inpatient Hospital Stay: Payer: PPO

## 2018-06-07 ENCOUNTER — Encounter: Payer: Self-pay | Admitting: Oncology

## 2018-06-07 ENCOUNTER — Inpatient Hospital Stay (HOSPITAL_BASED_OUTPATIENT_CLINIC_OR_DEPARTMENT_OTHER): Payer: PPO | Admitting: Oncology

## 2018-06-07 ENCOUNTER — Other Ambulatory Visit: Payer: Self-pay

## 2018-06-07 VITALS — BP 138/78 | HR 65

## 2018-06-07 VITALS — BP 155/97 | HR 73 | Temp 97.0°F | Resp 20 | Ht 65.0 in | Wt 246.0 lb

## 2018-06-07 DIAGNOSIS — E876 Hypokalemia: Secondary | ICD-10-CM | POA: Diagnosis not present

## 2018-06-07 DIAGNOSIS — C50919 Malignant neoplasm of unspecified site of unspecified female breast: Secondary | ICD-10-CM

## 2018-06-07 DIAGNOSIS — Z90710 Acquired absence of both cervix and uterus: Secondary | ICD-10-CM | POA: Diagnosis not present

## 2018-06-07 DIAGNOSIS — Z5111 Encounter for antineoplastic chemotherapy: Secondary | ICD-10-CM

## 2018-06-07 DIAGNOSIS — C50212 Malignant neoplasm of upper-inner quadrant of left female breast: Secondary | ICD-10-CM | POA: Diagnosis not present

## 2018-06-07 DIAGNOSIS — Z171 Estrogen receptor negative status [ER-]: Secondary | ICD-10-CM

## 2018-06-07 DIAGNOSIS — M5431 Sciatica, right side: Secondary | ICD-10-CM

## 2018-06-07 DIAGNOSIS — T451X5A Adverse effect of antineoplastic and immunosuppressive drugs, initial encounter: Secondary | ICD-10-CM | POA: Insufficient documentation

## 2018-06-07 DIAGNOSIS — D701 Agranulocytosis secondary to cancer chemotherapy: Secondary | ICD-10-CM

## 2018-06-07 LAB — COMPREHENSIVE METABOLIC PANEL
ALT: 50 U/L — ABNORMAL HIGH (ref 0–44)
AST: 40 U/L (ref 15–41)
Albumin: 3.8 g/dL (ref 3.5–5.0)
Alkaline Phosphatase: 86 U/L (ref 38–126)
Anion gap: 13 (ref 5–15)
BUN: 11 mg/dL (ref 8–23)
CO2: 25 mmol/L (ref 22–32)
Calcium: 8.9 mg/dL (ref 8.9–10.3)
Chloride: 99 mmol/L (ref 98–111)
Creatinine, Ser: 0.85 mg/dL (ref 0.44–1.00)
GFR calc Af Amer: >60 mL/min (ref >60)
GFR calc non Af Amer: >60 mL/min (ref >60)
Glucose, Bld: 212 mg/dL — ABNORMAL HIGH (ref 70–99)
Potassium: 3.3 mmol/L — ABNORMAL LOW (ref 3.5–5.1)
Sodium: 137 mmol/L (ref 135–145)
Total Bilirubin: 0.4 mg/dL (ref 0.3–1.2)
Total Protein: 6.6 g/dL (ref 6.5–8.1)

## 2018-06-07 LAB — CBC WITH DIFFERENTIAL/PLATELET
Abs Immature Granulocytes: 1.47 10*3/uL — ABNORMAL HIGH (ref 0.00–0.07)
Basophils Absolute: 0.1 10*3/uL (ref 0.0–0.1)
Basophils Relative: 1 %
Eosinophils Absolute: 0.1 10*3/uL (ref 0.0–0.5)
Eosinophils Relative: 1 %
HCT: 38 % (ref 36.0–46.0)
Hemoglobin: 12.5 g/dL (ref 12.0–15.0)
Immature Granulocytes: 15 %
Lymphocytes Relative: 19 %
Lymphs Abs: 1.9 10*3/uL (ref 0.7–4.0)
MCH: 29 pg (ref 26.0–34.0)
MCHC: 32.9 g/dL (ref 30.0–36.0)
MCV: 88.2 fL (ref 80.0–100.0)
Monocytes Absolute: 0.9 10*3/uL (ref 0.1–1.0)
Monocytes Relative: 9 %
Neutro Abs: 5.4 10*3/uL (ref 1.7–7.7)
Neutrophils Relative %: 55 %
Platelets: 147 10*3/uL — ABNORMAL LOW (ref 150–400)
RBC: 4.31 MIL/uL (ref 3.87–5.11)
RDW: 13.7 % (ref 11.5–15.5)
Smear Review: NORMAL
WBC: 9.9 10*3/uL (ref 4.0–10.5)
nRBC: 1 % — ABNORMAL HIGH (ref 0.0–0.2)

## 2018-06-07 MED ORDER — SODIUM CHLORIDE 0.9 % IV SOLN
600.0000 mg/m2 | Freq: Once | INTRAVENOUS | Status: AC
  Administered 2018-06-07: 12:00:00 1360 mg via INTRAVENOUS
  Filled 2018-06-07: qty 25

## 2018-06-07 MED ORDER — DOXORUBICIN HCL CHEMO IV INJECTION 2 MG/ML
60.0000 mg/m2 | Freq: Once | INTRAVENOUS | Status: AC
  Administered 2018-06-07: 12:00:00 136 mg via INTRAVENOUS
  Filled 2018-06-07: qty 68

## 2018-06-07 MED ORDER — PALONOSETRON HCL INJECTION 0.25 MG/5ML
0.2500 mg | Freq: Once | INTRAVENOUS | Status: AC
  Administered 2018-06-07: 11:00:00 0.25 mg via INTRAVENOUS
  Filled 2018-06-07: qty 5

## 2018-06-07 MED ORDER — POTASSIUM CHLORIDE CRYS ER 20 MEQ PO TBCR: 20.0000 meq | EXTENDED_RELEASE_TABLET | Freq: Every day | ORAL | 0 refills | Status: DC

## 2018-06-07 MED ORDER — PEGFILGRASTIM 6 MG/0.6ML ~~LOC~~ PSKT
6.0000 mg | PREFILLED_SYRINGE | Freq: Once | SUBCUTANEOUS | Status: AC
  Administered 2018-06-07: 13:00:00 6 mg via SUBCUTANEOUS
  Filled 2018-06-07: qty 0.6

## 2018-06-07 MED ORDER — SODIUM CHLORIDE 0.9 % IV SOLN
Freq: Once | INTRAVENOUS | Status: AC
  Administered 2018-06-07: 10:00:00 via INTRAVENOUS
  Filled 2018-06-07: qty 250

## 2018-06-07 MED ORDER — SODIUM CHLORIDE 0.9 % IV SOLN
Freq: Once | INTRAVENOUS | Status: AC
  Administered 2018-06-07: 11:00:00 via INTRAVENOUS
  Filled 2018-06-07: qty 5

## 2018-06-07 MED ORDER — HEPARIN SOD (PORK) LOCK FLUSH 100 UNIT/ML IV SOLN
500.0000 [IU] | Freq: Once | INTRAVENOUS | Status: AC | PRN
  Administered 2018-06-07: 500 [IU]
  Filled 2018-06-07: qty 5

## 2018-06-07 NOTE — Progress Notes (Signed)
Hematology/Oncology progress note Megan Harris [phone] [phone]   Patient Care Team: Idelle Crouch, MD as PCP - General (Internal Medicine) Bary Castilla Forest Gleason, MD as Consulting Physician (General Surgery)  REFERRING PROVIDER: Idelle Crouch, MD REASON FOR VISIT:  Follow up for adjuvant chemotherapy for breast cancer.   HISTORY OF PRESENTING ILLNESS:  Megan Harris is a  68 y.o.  female with PMH listed below who was referred to me for evaluation of breast cancer  Patient had screening mammogram on 03/27/2018 which showed left breast mass. She had diagnostic and ultrasound on 04/07/2018 which showed 0.9cm x 0.9cm x 1.4cm irregular border mixed echotexture mass at the left breast 11 o'clock 7 cm from nipple correlating to the mammographic finding. Ultrasound of the left axilla is negative She underwent biopsy of left breast mass.   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 Estrogen receptor negative, progesterone receptor negative, HER-2 negative.  Nipple discharge: Denies Family history: Paternal grandmother had breast cancer, mother deceased from brain cancer, sister deceased from bone cancer, maternal uncle had colon cancer. OCP use: Short-term use remotely Estrogen and progesterone therapy: denies History of radiation to chest: denies.  Previous breast surgery: Biopsy of left breast in early 90s.  Negative per patient.  History of complete hysterectomy due to endometriosis.  05/09/2018 Baseline MUGA showed no focal wall motion abnormality of the left ventricle.  Calculated LVEF 61%  INTERVAL HISTORY ANAYSHA Harris is a 68 y.o. 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  Patient reports feeling  pretty well today. #Low back pain, during the interval, patient called in complaining of sudden onset of lower back pain across her buttocks and shooting down right leg.  She rated pain 9 out of 10, exacerbated when she sits up.  Relief when lying down. Lower back pain was felt to be sciatic pain.  I recommended patient to try ibuprofen 686m we will highly do need every 8 hours as needed.  Today patient reports pain is much better now.  #Appetite is good. Denies any nausea, vomiting, fever, chills, chest pain or abdominal pain. She has taken Claritin for 4 days for G-CSF.  Reports feeling well.  No significant bone pain  Review of Systems  Constitutional: Positive for fatigue. Negative for appetite change, chills and fever.  HENT:   Negative for hearing loss and voice change.   Eyes: Negative for eye problems.  Respiratory: Negative for chest tightness and cough.   Cardiovascular: Negative for chest pain.  Gastrointestinal: Negative for abdominal distention, abdominal pain and blood in stool.  Endocrine: Negative for hot flashes.  Genitourinary: Negative for difficulty urinating and frequency.   Musculoskeletal: Negative for arthralgias.  Skin: Negative for itching and rash.  Neurological: Negative for extremity weakness.  Hematological: Negative for adenopathy.  Psychiatric/Behavioral: Negative for confusion. The patient is not nervous/anxious.     MEDICAL HISTORY:  Past Medical History:  Diagnosis Date  . Abdominal pain, left lower quadrant 2012  . Anemia    vitamin d deficiency  . Arthritis   . Asthma   . Back pain 2005   lower back arthritis  [place]   173m T1c, N0 triple negative  . Chronic cholecystitis 2012   has had choleycystectomy.  not an issue  .  Constipation 2005  . Endocrine problem 2005  . Family history of bone cancer   . Family history of brain cancer   . Family history of colon cancer   . Family history of lung cancer   . GERD  (gastroesophageal reflux disease) 2012  . Hyperlipidemia 2012  . Hypertension 2002  . Nausea with vomiting 2012  . Obesity, unspecified 2012  . Pneumonia   . PONV (postoperative nausea and vomiting)   . Special screening for malignant neoplasms, colon 2012    SURGICAL HISTORY: Past Surgical History:  Procedure Laterality Date  . BILATERAL CARPAL TUNNEL RELEASE Bilateral   . BREAST BIOPSY Left 04/13/2018   Pending Path  . BREAST EXCISIONAL BIOPSY Left early 90s   neg  . BREAST LUMPECTOMY Left 04/26/2018   Procedure: BREAST LUMPECTOMY WITH EXCISION OF SENTINEL NODE;  Surgeon: Robert Bellow, MD;  Location: [facility] ORS;  Service: General;  Laterality: Left;  . BUNIONECTOMY Bilateral 2003  . CHOLECYSTECTOMY  06/04/2010  . COLON RESECTION  2004   due diverticulitis   . COLONOSCOPY  2005   [place], Dr. Bary Castilla  . COLONOSCOPY  06/28/2012  . COLOSTOMY  2004  . COLOSTOMY REVERSAL  2004  . FOOT SURGERY Bilateral 2012   plantar faciatis  . HAND SURGERY Bilateral    carpal tunnel  . HERNIA REPAIR  2005   at colostomy site after reversal done  . JOINT REPLACEMENT    . KNEE ARTHROPLASTY Right 04/20/2017   Procedure: COMPUTER ASSISTED TOTAL KNEE ARTHROPLASTY;  Surgeon: Dereck Leep, MD;  Location: [facility] ORS;  Service: Orthopedics;  Laterality: Right;  . KNEE ARTHROSCOPY Right 02/03/2015   Procedure: ARTHROSCOPY right knee, partial medial menisectomy, condyle malleolus, patella and femoral;  Surgeon: Dereck Leep, MD;  Location: [facility] ORS;  Service: Orthopedics;  Laterality: Right;  . NASAL SINUS SURGERY     2005  . PORTACATH PLACEMENT Right 04/26/2018   Procedure: INSERTION PORT-A-CATH RIGHT;  Surgeon: Robert Bellow, MD;  Location: [facility] ORS;  Service: General;  Laterality: Right;  . SALPINGOOPHORECTOMY  1998  . TONSILLECTOMY    . TUBAL LIGATION  1978  . UPPER GI ENDOSCOPY  06/28/2012  . VAGINAL HYSTERECTOMY  1987    SOCIAL HISTORY: Social History   Socioeconomic History  .  Marital status: Married    Spouse name: Francee Piccolo  . Number of children: 2  . Years of education: Not on file  . Highest education level: Not on file  Occupational History  . 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 Financial resource strain: Not on file  . Food insecurity:    Worry: Not on file    Inability: Not on file  . Transportation needs:    Medical: Not on file    Non-medical: Not on file  Tobacco Use  . Smoking status: Never Smoker  . Smokeless tobacco: Never Used  Substance and Sexual Activity  . Alcohol use: Yes    Comment: socially  . Drug use: No  . Sexual activity: Yes    Birth control/protection: Surgical  Lifestyle  . Physical activity:    Days per week: Not on file    Minutes per session: Not on file  . Stress: Not on file  Relationships  . Social connections:    Talks on phone: Not on file    Gets together: Not on file    Attends religious service: Not on file  Active member of club or organization: Not on file    Attends meetings of clubs or organizations: Not on file    Relationship status: Not on file  . Intimate partner violence:    Fear of current or ex partner: Not on file    Emotionally abused: Not on file    Physically abused: Not on file    Forced sexual activity: Not on file  Other Topics Concern  . Not on file  Social History Narrative  . Not on file   Lives at home with husband.  FAMILY HISTORY: Family History  Problem Relation Age of Onset  . Heart disease Father   . Diabetes Paternal Grandmother   . Diabetes Paternal Grandfather   . Brain cancer Mother 34  . Bone cancer Sister 24  . Colon cancer Maternal Uncle        dx 50s-60s  . Throat cancer Cousin   . Leukemia Cousin   . Lung cancer Cousin   . Ovarian cancer Neg Hx     ALLERGIES:  is allergic to codeine and penicillins.  MEDICATIONS:  Current Outpatient Medications  Medication Sig Dispense Refill  . albuterol (PROVENTIL  HFA;VENTOLIN HFA) 108 (90 Base) MCG/ACT inhaler Inhale 2 puffs into the lungs every 6 (six) hours as needed for wheezing or shortness of breath.     Marland Kitchen amLODipine (NORVASC) 5 MG tablet Take 5 mg by mouth daily.    Marland Kitchen atorvastatin (LIPITOR) 10 MG tablet Take 10 mg by mouth daily.    . chlorhexidine (PERIDEX) 0.12 % solution Use as directed 15 mLs in the mouth or throat 2 (two) times daily. 473 mL 1  . Cholecalciferol (VITAMIN D3 MAXIMUM STRENGTH) 5000 units capsule Take 5,000 Units by mouth daily.    Marland Kitchen dextromethorphan-guaiFENesin (MUCINEX DM) 30-600 MG 12hr tablet Take 1 tablet by mouth daily as needed for cough.    . gabapentin (NEURONTIN) 300 MG capsule Take 300 mg by mouth 3 (three) times daily.    . hydrochlorothiazide (HYDRODIURIL) 25 MG tablet Take 25 mg by mouth daily.    . lansoprazole (PREVACID) 30 MG capsule Take 30 mg by mouth 2 (two) times daily.     Marland Kitchen levothyroxine (SYNTHROID, LEVOTHROID) 50 MCG tablet Take 50 mcg by mouth daily before breakfast.     . lidocaine-prilocaine (EMLA) cream Apply to affected area once 30 g 3  . losartan (COZAAR) 100 MG tablet Take 100 mg by mouth daily.    . ondansetron (ZOFRAN) 8 MG tablet Take 1 tablet (8 mg total) by mouth 2 (two) times daily as needed. Start on the third day after chemotherapy. 30 tablet 1  . prochlorperazine (COMPAZINE) 10 MG tablet Take 1 tablet (10 mg total) by mouth every 6 (six) hours as needed (Nausea or vomiting). 30 tablet 1  . zolpidem (AMBIEN) 10 MG tablet Take 10 mg by mouth at bedtime.     Marland Kitchen acetaminophen (TYLENOL) 500 MG tablet Take 500 mg by mouth every 6 (six) hours as needed.    . potassium chloride SA (K-DUR) 20 MEQ tablet Take 1 tablet (20 mEq total) by mouth daily. 3 tablet 0   No current facility-administered medications for this visit.      PHYSICAL EXAMINATION: ECOG PERFORMANCE STATUS: 0 - Asymptomatic Vitals:   06/07/18 0906  BP: (!) 155/97  Pulse: 73  Resp: 20  Temp: (!) 97 F (36.1 C)   Filed Weights    06/07/18 0906  Weight: 246 lb (111.6 kg)  Physical Exam Constitutional:      General: She is not in acute distress.    Appearance: She is obese.  HENT:     Head: Normocephalic and atraumatic.  Eyes:     General: No scleral icterus.    Pupils: Pupils are equal, round, and reactive to light.  Neck:     Musculoskeletal: Normal range of motion and neck supple.  Cardiovascular:     Rate and Rhythm: Normal rate and regular rhythm.     Heart sounds: Normal heart sounds.  Pulmonary:     Effort: Pulmonary effort is normal. No respiratory distress.     Breath sounds: No wheezing.  Abdominal:     General: Bowel sounds are normal. There is no distension.     Palpations: Abdomen is soft. There is no mass.     Tenderness: There is no abdominal tenderness.  Musculoskeletal: Normal range of motion.        General: No deformity.  Skin:    General: Skin is warm and dry.     Findings: No erythema or rash.  Neurological:     Mental Status: She is alert and oriented to person, place, and time.     Cranial Nerves: No cranial nerve deficit.     Coordination: Coordination normal.  Psychiatric:        Behavior: Behavior normal.        Thought Content: Thought content normal.       LABORATORY DATA:  I have reviewed the data as listed Lab Results  Component Value Date   WBC 9.9 06/07/2018   HGB 12.5 06/07/2018   HCT 38.0 06/07/2018   MCV 88.2 06/07/2018   PLT 147 (L) 06/07/2018   Recent Labs    05/24/18 0908 05/31/18 1427 06/07/18 0839  NA 136 137 137  K 3.2* 4.3 3.3*  CL 100 97* 99  CO2 27 31 25   GLUCOSE 204* 123* 212*  BUN 12 14 11   CREATININE 0.64 0.95 0.85  CALCIUM 8.9 9.0 8.9  GFRNONAA >60 >60 >60  GFRAA >60 >60 >60  PROT 6.8 7.0 6.6  ALBUMIN 3.6 3.9 3.8  AST 52* 50* 40  ALT 60* 64* 50*  ALKPHOS 75 83 86  BILITOT 0.7 0.6 0.4   Iron/TIBC/Ferritin/ %Sat No results found for: IRON, TIBC, FERRITIN, IRONPCTSAT   RADIOGRAPHIC STUDIES: I have personally reviewed  the radiological images as listed and agreed with the findings in the report. 04/07/2018 Unilateral left breast diagnostic mammogram.  Spot compression CC and MLO views of the left breast are submitted. The previously questioned mass in the upper slight medial left breast persists on additional views. Targeted ultrasound is performed, showing a 0.9 x 0.9 x 1.4 cm irregular border mixed echotexture mass at the left breast 11 o'clock 7 cm from nipple correlating to the mammographic finding. Ultrasound of the left axilla is negative. IMPRESSION: Suspicious findings.  Pathology 04/26/2018 CASE: [id number]  PATIENT: Clovis Cao  Surgical Pathology Report DIAGNOSIS:  A. BREAST, LEFT UPPER INNER QUADRANT; WIDE EXCISION:  - INVASIVE MAMMARY CARCINOMA.  - SEE CANCER SUMMARY BELOW.  - BIOPSY SITE CHANGE WITH HEMATOMA AND RIBBON CLIP.  - UNREMARKABLE SKIN.  B. SENTINEL LYMPH NODE #1; EXCISION:  - ONE LYMPH NODE NEGATIVE FOR MALIGNANCY (0/1).  C. NEW BREAST MARGIN, SUPERIOR; EXCISION:  - BENIGN BREAST TISSUE WITH BIOPSY CHANGE AND HEMATOMA.  - NEGATIVE FOR MALIGNANCY.  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, without comedonecrosis  Margins:    Invasive Carcinoma Margins: Uninvolved by invasive carcinoma            Distance from closest margin: 1.7 mm            Specify closest margin: Anterior       DCIS Margins: Uninvolved by DCIS            Distance from closest margin: 1.2 mm             Specify closest margin: Anterior  Regional Lymph Nodes: Uninvolved by tumor cells    Total Number of Lymph Nodes Examined: 1       Number of Sentinel Nodes Examined: 1  Treatment Effect in the Breast: No known presurgical therapy  Lymphovascular Invasion: Not identified  Pathologic Stage Classification (pTNM, AJCC 8th Edition): pT1c pN0 (sn)   Breast Biomarker Testing Performed on Previous Biopsy: [id number]  Estrogen Receptor (ER) Status: NEGATIVE  Progesterone Receptor (PgR) Status: NEGATIVE  HER2 (by immunohistochemistry): NEGATIVE     ASSESSMENT & PLAN:  1. 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 ([place])   2. Chemotherapy-induced neutropenia ([facility])   3. Encounter for antineoplastic chemotherapy   4. Hypokalemia   Cancer Staging 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 [facility]) Staging form: Breast, AJCC 8th Edition - Clinical stage from 04/28/2018: Stage IB (cT1c, cN0, cM0, G3, ER-, PR-, HER2-) - Signed by Earlie Server, MD on 04/29/2018 - Pathologic stage from 05/25/2018: Stage IB (pT1c, pN0(sn), cM0, G3, ER-, PR-, HER2-) - Signed by Earlie Server, MD on 05/25/2018  Stage IB left triple negative breast cancer S/p left lumpectomy and sentinel lymph node biopsy.  Currently on adjuvant chemotherapy. Tolerate first cycle of DD AC with G-CSF support. Labs are reviewed and discussed with patient. Notes acceptable to proceed with cycle 2 DD AC with G-CSF support.  #Chemotherapy-induced neutropenia, resolved.  ANC adequate to proceed with cycle 2 chemotherapy. #Sciatic pain, continue ibuprofen 600 mg 3 times daily as needed.  I recommend patient to update her PCP.  If pain persist, she will need image work-up as well as physical therapy.  #Hypokalemia, K 3.3, recommend patient to start on potassium 67mq daily x 3 days.  Encourage potassium rich food.  Follow-up in 2 weeks, lab and MD assessment-Dr. B prior to cycle 3 dose dense AC. All questions were answered. The patient knows to  call the clinic with any problems questions or concerns.  ZEarlie Server MD, PhD Hematology Oncology CReception And Medical Harris Hospitalat ASouthern [facility] Hospital & Health CenterPager- [phone]/20/2020

## 2018-06-11 ENCOUNTER — Telehealth: Payer: Self-pay | Admitting: Hematology and Oncology

## 2018-06-11 NOTE — Telephone Encounter (Signed)
Re:  Lingering nausea  Patient called to report lingering nausea post chemotherapy.  No vomiting.  She is taking ondansetron every 12 hours and compazine prn.  The ondansetron appears to work, but does not last 12 hours.  We discussed ondansetron 8 mg po q 8 hours prn nausea.  We discussed consideration of Decadron post treatment with her next cycle.  We also discussed scopolamine patch if she does not have glaucoma.  We also discussed low dose Ativan prn.  Potential side effects were reviewed.   Megan Asal, MD

## 2018-06-12 ENCOUNTER — Other Ambulatory Visit: Payer: Self-pay | Admitting: Oncology

## 2018-06-12 DIAGNOSIS — C50919 Malignant neoplasm of unspecified site of unspecified female breast: Secondary | ICD-10-CM

## 2018-06-13 ENCOUNTER — Ambulatory Visit (INDEPENDENT_AMBULATORY_CARE_PROVIDER_SITE_OTHER): Payer: PPO | Admitting: General Surgery

## 2018-06-13 ENCOUNTER — Other Ambulatory Visit: Payer: Self-pay

## 2018-06-13 ENCOUNTER — Encounter: Payer: Self-pay | Admitting: General Surgery

## 2018-06-13 ENCOUNTER — Telehealth: Payer: Self-pay

## 2018-06-13 ENCOUNTER — Other Ambulatory Visit: Payer: Self-pay | Admitting: Oncology

## 2018-06-13 VITALS — BP 132/88 | HR 101 | Temp 96.6°F | Resp 16 | Ht 65.0 in | Wt 243.0 lb

## 2018-06-13 DIAGNOSIS — C50919 Malignant neoplasm of unspecified site of unspecified female breast: Secondary | ICD-10-CM

## 2018-06-13 DIAGNOSIS — C50212 Malignant neoplasm of upper-inner quadrant of left female breast: Secondary | ICD-10-CM

## 2018-06-13 DIAGNOSIS — Z171 Estrogen receptor negative status [ER-]: Secondary | ICD-10-CM

## 2018-06-13 MED ORDER — NYSTATIN 100000 UNIT/ML MT SUSP: 5.0000 mL | Freq: Four times a day (QID) | OROMUCOSAL | 0 refills | Status: DC

## 2018-06-13 MED ORDER — PROMETHAZINE HCL 25 MG PO TABS: 25.0000 mg | ORAL_TABLET | Freq: Four times a day (QID) | ORAL | 1 refills | Status: DC | PRN

## 2018-06-13 NOTE — Patient Instructions (Signed)
The patient is aware to call back for any questions or new concerns.  

## 2018-06-13 NOTE — Progress Notes (Signed)
Patient ID: Megan Harris, female   DOB: May 26, 1950, 68 y.o.   MRN: [id number]  Chief Complaint  Patient presents with  . Follow-up    Left breast lumpectomy    HPI Megan Harris is a 68 y.o. female.  Follow up left breast lumpectomy. She admits to nausea but no vomiting. She is using Zofran and compazine for the nausea. She states her tongue looks different.  HPI  Past Medical History:  Diagnosis Date  . Abdominal pain, left lower quadrant 2012  . Anemia    vitamin d deficiency  . Arthritis   . Asthma   . Back pain 2005   lower back arthritis  . Cancer ([place]) 04/26/2018   64mm, T1c, N0 triple negative  . Chronic cholecystitis 2012   has had choleycystectomy.  not an issue  . Constipation 2005  . Endocrine problem 2005  . Family history of bone cancer   . Family history of brain cancer   . Family history of colon cancer   . Family history of lung cancer   . GERD (gastroesophageal reflux disease) 2012  . Hyperlipidemia 2012  . Hypertension 2002  . Nausea with vomiting 2012  . Obesity, unspecified 2012  . Pneumonia   . PONV (postoperative nausea and vomiting)   . Special screening for malignant neoplasms, colon 2012    Past Surgical History:  Procedure Laterality Date  . BILATERAL CARPAL TUNNEL RELEASE Bilateral   . BREAST BIOPSY Left 04/13/2018   Pending Path  . BREAST EXCISIONAL BIOPSY Left early 90s   neg  . BREAST LUMPECTOMY Left 04/26/2018   Procedure: BREAST LUMPECTOMY WITH EXCISION OF SENTINEL NODE;  Surgeon: Robert Bellow, MD;  Location: [facility] ORS;  Service: General;  Laterality: Left;  . BUNIONECTOMY Bilateral 2003  . CHOLECYSTECTOMY  06/04/2010  . COLON RESECTION  2004   due diverticulitis   . COLONOSCOPY  2005   [place], Dr. Bary Castilla  . COLONOSCOPY  06/28/2012  . COLOSTOMY  2004  . COLOSTOMY REVERSAL  2004  . FOOT SURGERY Bilateral 2012   plantar faciatis  . HAND SURGERY Bilateral    carpal tunnel  . HERNIA REPAIR  2005   at colostomy site after  reversal done  . JOINT REPLACEMENT    . KNEE ARTHROPLASTY Right 04/20/2017   Procedure: COMPUTER ASSISTED TOTAL KNEE ARTHROPLASTY;  Surgeon: Dereck Leep, MD;  Location: [facility] ORS;  Service: Orthopedics;  Laterality: Right;  . KNEE ARTHROSCOPY Right 02/03/2015   Procedure: ARTHROSCOPY right knee, partial medial menisectomy, condyle malleolus, patella and femoral;  Surgeon: Dereck Leep, MD;  Location: [facility] OR[facility];  Service: Orthopedics;  Laterality: Right;  . NASAL SINUS SURGERY     2005  . PORTACATH PLACEMENT Right 04/26/2018   Procedure: INSERTION PORT-A-CATH RIGHT;  Surgeon: Robert Bellow, MD;  Location: [facility];  Service: General;  Laterality: Right;  . SALPINGOOPHORECTOMY  1998  . TONSILLECTOMY    . TUBAL LIGATION  1978  . UPPER GI ENDOSCOPY  06/28/2012  . VAGINAL HYSTERECTOMY  1987    Family History  Problem Relation Age of Onset  . Heart disease Father   . Diabetes Paternal Grandmother   . Diabetes Paternal Grandfather   . Brain cancer Mother 49  . Bone cancer Sister 49  . Colon cancer Maternal Uncle        dx 50s-60s  . Throat cancer Cousin   . Leukemia Cousin   . Lung cancer Cousin   . Ovarian cancer Neg  Hx     Social History Social History   Tobacco Use  . Smoking status: Never Smoker  . Smokeless tobacco: Never Used  Substance Use Topics  . Alcohol use: Yes    Comment: socially  . Drug use: No    Allergies  Allergen Reactions  . Codeine Other (See Comments)    Goes to sleep and unable to wake up.  [facility] Penicillins Hives    Did it involve swelling of the face/tongue/throat, SOB, or low BP? No Did it involve sudden or severe rash/hives, skin peeling, or any reaction on the inside of your mouth or nose? No Did you need to seek medical attention at a hospital or doctor's office? Yes When did it last happen?50s If all above answers are "NO", may proceed with cephalosporin use.     Current Outpatient Medications  Medication Sig Dispense Refill  .  acetaminophen (TYLENOL) 500 MG tablet Take 500 mg by mouth every 6 (six) hours as needed.    Marland Kitchen albuterol (PROVENTIL HFA;VENTOLIN HFA) 108 (90 Base) MCG/ACT inhaler Inhale 2 puffs into the lungs every 6 (six) hours as needed for wheezing or shortness of breath.     Marland Kitchen amLODipine (NORVASC) 5 MG tablet Take 5 mg by mouth daily.    Marland Kitchen atorvastatin (LIPITOR) 10 MG tablet Take 10 mg by mouth daily.    . chlorhexidine (PERIDEX) 0.12 % solution Use as directed 15 mLs in the mouth or throat 2 (two) times daily. 473 mL 1  . Cholecalciferol (VITAMIN D3 MAXIMUM STRENGTH) 5000 units capsule Take 5,000 Units by mouth daily.    Marland Kitchen dextromethorphan-guaiFENesin (MUCINEX DM) 30-600 MG 12hr tablet Take 1 tablet by mouth daily as needed for cough.    . gabapentin (NEURONTIN) 300 MG capsule Take 300 mg by mouth 3 (three) times daily.    . hydrochlorothiazide (HYDRODIURIL) 25 MG tablet Take 25 mg by mouth daily.    . lansoprazole (PREVACID) 30 MG capsule Take 30 mg by mouth 2 (two) times daily.     Marland Kitchen levothyroxine (SYNTHROID, LEVOTHROID) 50 MCG tablet Take 50 mcg by mouth daily before breakfast.     . lidocaine-prilocaine (EMLA) cream Apply to affected area once 30 g 3  . losartan (COZAAR) 100 MG tablet Take 100 mg by mouth daily.    . ondansetron (ZOFRAN) 8 MG tablet Take 1 tablet (8 mg total) by mouth 2 (two) times daily as needed. Start on the third day after chemotherapy. 30 tablet 1  . potassium chloride SA (K-DUR) 20 MEQ tablet Take 1 tablet (20 mEq total) by mouth daily. 3 tablet 0  . prochlorperazine (COMPAZINE) 10 MG tablet Take 1 tablet (10 mg total) by mouth every 6 (six) hours as needed (Nausea or vomiting). 30 tablet 1  . zolpidem (AMBIEN) 10 MG tablet Take 10 mg by mouth at bedtime.     Marland Kitchen nystatin (MYCOSTATIN) 100000 UNIT/ML suspension Take 5 mLs (500,000 Units total) by mouth 4 (four) times daily. 473 mL 0  . promethazine (PHENERGAN) 25 MG tablet Take 1 tablet (25 mg total) by mouth every 6 (six) hours as  needed for nausea or vomiting. 30 tablet 1   No current facility-administered medications for this visit.     Review of Systems Review of Systems  Constitutional: Negative.   Respiratory: Negative.   Cardiovascular: Negative.     Blood pressure 132/88, pulse (!) 101, temperature (!) 96.6 F (35.9 C), temperature source Temporal, resp. rate 16, height 5\' 5"  (1.651 m), weight  243 lb (110.2 kg), SpO2 96 %.  Physical Exam Physical Exam Exam conducted with a chaperone present.  Constitutional:      Appearance: She is well-developed.  HENT:     Mouth/Throat:     Mouth: Mucous membranes are dry.     Tongue: Tongue lesions: Thickened, hypertrophy of papilla.  Eyes:     General: No scleral icterus.    Conjunctiva/sclera: Conjunctivae normal.  Neck:     Musculoskeletal: Normal range of motion.  Chest:     Breasts:        Right: No inverted nipple, mass, nipple discharge, skin change or tenderness.        Left: No inverted nipple, mass, nipple discharge, skin change or tenderness.       Comments: Firmness improved left breast Lymphadenopathy:     Cervical: No cervical adenopathy.  Skin:    General: Skin is warm and dry.  Neurological:     Mental Status: She is alert and oriented to person, place, and time.  Psychiatric:        Behavior: Behavior normal.        Data Reviewed No new data.  Assessment Doing well post wide excision.  Mild persistent nausea in spite of change in dose of Zofran and as needed Compazine.  Thick sensation to the tongue.  Plan  Dr Tasia Catchings contacted.  Image shared.  Recommended for nystatin oral suspension. Patient to contact Dr. Tasia Catchings if nausea becomes more pronounced and consider change to Phenergan which she has done well with in the past.  Follow up in October, no mammograms required at that time.  The patient is aware to call back for any questions or new concerns.    HPI, Physical Exam, Assessment and Plan have been scribed under the  direction and in the presence of Robert Bellow, MD. Jonnie Finner, CMA HPI, assessment, plan and physical exam has been scribed under the direction and in the presence of Robert Bellow, MD. Karie Fetch, RN  I have completed the exam and reviewed the above documentation for accuracy and completeness.  I agree with the above.  Haematologist has been used and any errors in dictation or transcription are unintentional.  Hervey Ard, M.D., F.A.C.S.  Forest Gleason Cade Olberding 06/13/2018, 8:33 PM

## 2018-06-13 NOTE — Telephone Encounter (Signed)
Returned patient's call. Pt states that she usually gets labwork on wednesdays and virtual visit on thurs (in between her treatments). Per Dr. Tasia Catchings ok to schedule since patient had not been feeling well. Labwork and virtual visit scheduled as requested.   also, notified pt that nystatin and phenergan were sent to pharmacy.

## 2018-06-14 ENCOUNTER — Other Ambulatory Visit: Payer: Self-pay

## 2018-06-14 ENCOUNTER — Other Ambulatory Visit: Payer: Self-pay | Admitting: Oncology

## 2018-06-14 ENCOUNTER — Inpatient Hospital Stay: Payer: PPO

## 2018-06-14 DIAGNOSIS — Z5111 Encounter for antineoplastic chemotherapy: Secondary | ICD-10-CM | POA: Diagnosis not present

## 2018-06-14 DIAGNOSIS — C50919 Malignant neoplasm of unspecified site of unspecified female breast: Secondary | ICD-10-CM

## 2018-06-14 LAB — CBC WITH DIFFERENTIAL/PLATELET
Abs Immature Granulocytes: 0.1 10*3/uL — ABNORMAL HIGH (ref 0.00–0.07)
Basophils Absolute: 0 10*3/uL (ref 0.0–0.1)
Basophils Relative: 7 %
Eosinophils Absolute: 0 10*3/uL (ref 0.0–0.5)
Eosinophils Relative: 3 %
HCT: 35.4 % — ABNORMAL LOW (ref 36.0–46.0)
Hemoglobin: 11.7 g/dL — ABNORMAL LOW (ref 12.0–15.0)
Immature Granulocytes: 16 %
Lymphocytes Relative: 55 %
Lymphs Abs: 0.3 10*3/uL — ABNORMAL LOW (ref 0.7–4.0)
MCH: 29.2 pg (ref 26.0–34.0)
MCHC: 33.1 g/dL (ref 30.0–36.0)
MCV: 88.3 fL (ref 80.0–100.0)
Monocytes Absolute: 0.1 10*3/uL (ref 0.1–1.0)
Monocytes Relative: 11 %
Neutro Abs: 0.1 10*3/uL — ABNORMAL LOW (ref 1.7–7.7)
Neutrophils Relative %: 8 %
Platelets: 180 10*3/uL (ref 150–400)
RBC: 4.01 MIL/uL (ref 3.87–5.11)
RDW: 13.9 % (ref 11.5–15.5)
Smear Review: NORMAL
WBC: 0.6 10*3/uL — CL (ref 4.0–10.5)
nRBC: 0 % (ref 0.0–0.2)

## 2018-06-14 LAB — COMPREHENSIVE METABOLIC PANEL
ALT: 69 U/L — ABNORMAL HIGH (ref 0–44)
AST: 62 U/L — ABNORMAL HIGH (ref 15–41)
Albumin: 3.9 g/dL (ref 3.5–5.0)
Alkaline Phosphatase: 97 U/L (ref 38–126)
Anion gap: 10 (ref 5–15)
BUN: 18 mg/dL (ref 8–23)
CO2: 28 mmol/L (ref 22–32)
Calcium: 8.8 mg/dL — ABNORMAL LOW (ref 8.9–10.3)
Chloride: 98 mmol/L (ref 98–111)
Creatinine, Ser: 1.17 mg/dL — ABNORMAL HIGH (ref 0.44–1.00)
GFR calc Af Amer: 55 mL/min — ABNORMAL LOW (ref >60)
GFR calc non Af Amer: 48 mL/min — ABNORMAL LOW (ref >60)
Glucose, Bld: 176 mg/dL — ABNORMAL HIGH (ref 70–99)
Potassium: 4.1 mmol/L (ref 3.5–5.1)
Sodium: 136 mmol/L (ref 135–145)
Total Bilirubin: 0.5 mg/dL (ref 0.3–1.2)
Total Protein: 6.5 g/dL (ref 6.5–8.1)

## 2018-06-14 MED ORDER — ONDANSETRON HCL 8 MG PO TABS: 8.0000 mg | ORAL_TABLET | Freq: Two times a day (BID) | ORAL | 1 refills | Status: DC | PRN

## 2018-06-15 ENCOUNTER — Other Ambulatory Visit: Payer: Self-pay

## 2018-06-15 ENCOUNTER — Inpatient Hospital Stay (HOSPITAL_BASED_OUTPATIENT_CLINIC_OR_DEPARTMENT_OTHER): Payer: PPO | Admitting: Oncology

## 2018-06-15 ENCOUNTER — Encounter: Payer: Self-pay | Admitting: Oncology

## 2018-06-15 DIAGNOSIS — B370 Candidal stomatitis: Secondary | ICD-10-CM

## 2018-06-15 DIAGNOSIS — E860 Dehydration: Secondary | ICD-10-CM | POA: Insufficient documentation

## 2018-06-15 DIAGNOSIS — T451X5A Adverse effect of antineoplastic and immunosuppressive drugs, initial encounter: Secondary | ICD-10-CM

## 2018-06-15 DIAGNOSIS — C50919 Malignant neoplasm of unspecified site of unspecified female breast: Secondary | ICD-10-CM | POA: Diagnosis not present

## 2018-06-15 DIAGNOSIS — D701 Agranulocytosis secondary to cancer chemotherapy: Secondary | ICD-10-CM

## 2018-06-15 NOTE — Progress Notes (Signed)
Called patient today for Telehealth visit via [facility].  Patient states no new concerns today.

## 2018-06-16 ENCOUNTER — Inpatient Hospital Stay: Payer: PPO

## 2018-06-16 ENCOUNTER — Other Ambulatory Visit: Payer: Self-pay

## 2018-06-16 VITALS — BP 132/84 | HR 75 | Resp 20

## 2018-06-16 DIAGNOSIS — E860 Dehydration: Secondary | ICD-10-CM

## 2018-06-16 DIAGNOSIS — Z5111 Encounter for antineoplastic chemotherapy: Secondary | ICD-10-CM | POA: Diagnosis not present

## 2018-06-16 DIAGNOSIS — C50919 Malignant neoplasm of unspecified site of unspecified female breast: Secondary | ICD-10-CM

## 2018-06-16 MED ORDER — SODIUM CHLORIDE 0.9 % IV SOLN
Freq: Once | INTRAVENOUS | Status: AC
  Administered 2018-06-16: 14:00:00 via INTRAVENOUS
  Filled 2018-06-16: qty 250

## 2018-06-16 MED ORDER — HEPARIN SOD (PORK) LOCK FLUSH 100 UNIT/ML IV SOLN
500.0000 [IU] | Freq: Once | INTRAVENOUS | Status: AC
  Administered 2018-06-16: 500 [IU] via INTRAVENOUS
  Filled 2018-06-16: qty 5

## 2018-06-18 NOTE — Progress Notes (Signed)
[facility] HEMATOLOGY-ONCOLOGY TeleHEALTH VISIT PROGRESS NOTE  I connected with [name] on 06/18/18 at  2:30 PM EDT by video enabled telemedicine visit and verified that I am speaking with the correct person using two identifiers. I discussed the limitations, risks, security and privacy concerns of performing an evaluation and management service by telemedicine and the availability of in-person appointments. I also discussed with the patient that there may be a patient responsible charge related to this service. The patient expressed understanding and agreed to proceed.   Other persons participating in the visit and their role in the encounter:  Janeann Merl, RN, check in patient.  Patient's daughter Megan Harris and son Megan Harris.  Patient's location: Home  Provider's location: work  Risk analyst Complaint: Follow-up for evaluation of chemotherapy tolerability.   INTERVAL HISTORY Megan Harris is a 68 y.o. female who has above history reviewed by me today presents for follow up visit for evaluation of chemotherapy tolerability. Problems and complaints are listed below:  Status post 2 cycles of dose dense AC. Patient had reported nausea which is not relieved by taking Compazine and Zofran. Phenergan was prescribed. Patient reports Phenergan as relieved her nausea. Appetite is poor due to nausea. Mucositis thrush is better after using nystatin oral rinse. Fatigue: reports worsening fatigue. Chronic onset, perisistent, no aggravating or improving factors, no associated symptoms.  Denies any fever, vomiting, chest pain, abdominal pain,  Review of Systems  Constitutional: Positive for fatigue. Negative for appetite change, chills and fever.  HENT:   Negative for hearing loss and voice change.        Mucositis  Eyes: Negative for eye problems.  Respiratory: Negative for chest tightness and cough.   Cardiovascular: Negative for chest pain.  Gastrointestinal: Positive for nausea. Negative for abdominal  distention, abdominal pain and blood in stool.  Endocrine: Negative for hot flashes.  Genitourinary: Negative for difficulty urinating and frequency.   Musculoskeletal: Negative for arthralgias.  Skin: Negative for itching and rash.  Neurological: Negative for extremity weakness.  Hematological: Negative for adenopathy.  Psychiatric/Behavioral: Negative for confusion.    Past Medical History:  Diagnosis Date  . Abdominal pain, left lower quadrant 2012  . Anemia    vitamin d deficiency  . Arthritis   . Asthma   . Back pain 2005   lower back arthritis  . Cancer ([place]) 04/26/2018   8mm, T1c, N0 triple negative  . Chronic cholecystitis 2012   has had choleycystectomy.  not an issue  . Constipation 2005  . Endocrine problem 2005  . Family history of bone cancer   . Family history of brain cancer   . Family history of colon cancer   . Family history of lung cancer   . GERD (gastroesophageal reflux disease) 2012  . Hyperlipidemia 2012  . Hypertension 2002  . Nausea with vomiting 2012  . Obesity, unspecified 2012  . Pneumonia   . PONV (postoperative nausea and vomiting)   . Special screening for malignant neoplasms, colon 2012   Past Surgical History:  Procedure Laterality Date  . BILATERAL CARPAL TUNNEL RELEASE Bilateral   . BREAST BIOPSY Left 04/13/2018   Pending Path  . BREAST EXCISIONAL BIOPSY Left early 90s   neg  . BREAST LUMPECTOMY Left 04/26/2018   Procedure: BREAST LUMPECTOMY WITH EXCISION OF SENTINEL NODE;  Surgeon: Robert Bellow, MD;  Location: [facility];  Service: General;  Laterality: Left;  . BUNIONECTOMY Bilateral 2003  . CHOLECYSTECTOMY  06/04/2010  . COLON RESECTION  2004  due diverticulitis   . COLONOSCOPY  2005   [place], Dr. Bary Castilla  . COLONOSCOPY  06/28/2012  . COLOSTOMY  2004  . COLOSTOMY REVERSAL  2004  . FOOT SURGERY Bilateral 2012   plantar faciatis  . HAND SURGERY Bilateral    carpal tunnel  . HERNIA REPAIR  2005   at colostomy site after  reversal done  . JOINT REPLACEMENT    . KNEE ARTHROPLASTY Right 04/20/2017   Procedure: COMPUTER ASSISTED TOTAL KNEE ARTHROPLASTY;  Surgeon: Dereck Leep, MD;  Location: [facility] OR[facility];  Service: Orthopedics;  Laterality: Right;  . KNEE ARTHROSCOPY Right 02/03/2015   Procedure: ARTHROSCOPY right knee, partial medial menisectomy, condyle malleolus, patella and femoral;  Surgeon: Dereck Leep, MD;  Location: [facility] OR[facility];  Service: Orthopedics;  Laterality: Right;  . NASAL SINUS SURGERY     2005  . PORTACATH PLACEMENT Right 04/26/2018   Procedure: INSERTION PORT-A-CATH RIGHT;  Surgeon: Robert Bellow, MD;  Location: [facility];  Service: General;  Laterality: Right;  . SALPINGOOPHORECTOMY  1998  . TONSILLECTOMY    . TUBAL LIGATION  1978  . UPPER GI ENDOSCOPY  06/28/2012  . VAGINAL HYSTERECTOMY  1987    Family History  Problem Relation Age of Onset  . Heart disease Father   . Diabetes Paternal Grandmother   . Diabetes Paternal Grandfather   . Brain cancer Mother 10  . Bone cancer Sister 59  . Colon cancer Maternal Uncle        dx 50s-60s  . Throat cancer Cousin   . Leukemia Cousin   . Lung cancer Cousin   . Ovarian cancer Neg Hx     Social History   Socioeconomic History  . Marital status: Married    Spouse name: Francee Piccolo  . Number of children: 2  . Years of education: Not on file  . Highest education level: Not on file  Occupational History  . 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 Financial resource strain: Not on file  . Food insecurity:    Worry: Not on file    Inability: Not on file  . Transportation needs:    Medical: Not on file    Non-medical: Not on file  Tobacco Use  . Smoking status: Never Smoker  . Smokeless tobacco: Never Used  Substance and Sexual Activity  . Alcohol use: Yes    Comment: socially  . Drug use: No  . Sexual activity: Yes    Birth control/protection: Surgical  Lifestyle  . Physical activity:    Days per week:  Not on file    Minutes per session: Not on file  . Stress: Not on file  Relationships  . Social connections:    Talks on phone: Not on file    Gets together: Not on file    Attends religious service: Not on file    Active member of club or organization: Not on file    Attends meetings of clubs or organizations: Not on file    Relationship status: Not on file  . Intimate partner violence:    Fear of current or ex partner: Not on file    Emotionally abused: Not on file    Physically abused: Not on file    Forced sexual activity: Not on file  Other Topics Concern  . Not on file  Social History Narrative  . Not on file    Current Outpatient Medications on File Prior to  Visit  Medication Sig Dispense Refill  . acetaminophen (TYLENOL) 500 MG tablet Take 500 mg by mouth every 6 (six) hours as needed.    Marland Kitchen albuterol (PROVENTIL HFA;VENTOLIN HFA) 108 (90 Base) MCG/ACT inhaler Inhale 2 puffs into the lungs every 6 (six) hours as needed for wheezing or shortness of breath.     Mar[facility] Kitchen amLODipine (NORVASC) 5 MG tablet Take 5 mg by mouth daily.    Marland Kitchen atorvastatin (LIPITOR) 10 MG tablet Take 10 mg by mouth daily.    . Cholecalciferol (VITAMIN D3 MAXIMUM STRENGTH) 5000 units capsule Take 5,000 Units by mouth daily.    Marland Kitchen dextromethorphan-guaiFENesin (MUCINEX DM) 30-600 MG 12hr tablet Take 1 tablet by mouth daily as needed for cough.    . gabapentin (NEURONTIN) 300 MG capsule Take 300 mg by mouth 3 (three) times daily.    . hydrochlorothiazide (HYDRODIURIL) 25 MG tablet Take 25 mg by mouth daily.    . lansoprazole (PREVACID) 30 MG capsule Take 30 mg by mouth 2 (two) times daily.     Marland Kitchen levothyroxine (SYNTHROID, LEVOTHROID) 50 MCG tablet Take 50 mcg by mouth daily before breakfast.     . lidocaine-prilocaine (EMLA) cream Apply to affected area once 30 g 3  . losartan (COZAAR) 100 MG tablet Take 100 mg by mouth daily.    Mar[facility] Kitchen nystatin (MYCOSTATIN) 100000 UNIT/ML suspension Take 5 mLs (500,000 Units total) by  mouth 4 (four) times daily. 473 mL 0  . ondansetron (ZOFRAN) 8 MG tablet Take 1 tablet (8 mg total) by mouth 2 (two) times daily as needed. Start on the third day after chemotherapy. 60 tablet 1  . potassium chloride SA (K-DUR) 20 MEQ tablet Take 1 tablet (20 mEq total) by mouth daily. 3 tablet 0  . promethazine (PHENERGAN) 25 MG tablet Take 1 tablet (25 mg total) by mouth every 6 (six) hours as needed for nausea or vomiting. 30 tablet 1  . zolpidem (AMBIEN) 10 MG tablet Take 10 mg by mouth at bedtime.     . chlorhexidine (PERIDEX) 0.12 % solution Use as directed 15 mLs in the mouth or throat 2 (two) times daily. (Patient not taking: Reported on 06/15/2018) 473 mL 1   No current facility-administered medications on file prior to visit.     Allergies  Allergen Reactions  . Codeine Other (See Comments)    Goes to sleep and unable to wake up.  [facility] Penicillins Hives    Did it involve swelling of the face/tongue/throat, SOB, or low BP? No Did it involve sudden or severe rash/hives, skin peeling, or any reaction on the inside of your mouth or nose? No Did you need to seek medical attention at a hospital or doctor's office? Yes When did it last happen?50s If all above answers are "NO", may proceed with cephalosporin use.        Observations/Objective: Today's Vitals   06/15/18 1310  PainSc: 0-No pain   There is no height or weight on file to calculate BMI.  Pain 0 Physical Exam  Constitutional: She is oriented to person, place, and time. No distress.  HENT:  Head: Normocephalic and atraumatic.  Neck:  [name] has improved  Pulmonary/Chest: Effort normal.  Neurological: She is alert and oriented to person, place, and time.  Psychiatric: Affect normal.    CBC    Component Value Date/Time   WBC 0.6 (LL) 06/14/2018 1430   RBC 4.01 06/14/2018 1430   HGB 11.7 (L) 06/14/2018 1430   HCT 35.4 (L) 06/14/2018 1430  PLT 180 06/14/2018 1430   MCV 88.3 06/14/2018 1430   MCH 29.2  06/14/2018 1430   MCHC 33.1 06/14/2018 1430   RDW 13.9 06/14/2018 1430   LYMPHSABS 0.3 (L) 06/14/2018 1430   MONOABS 0.1 06/14/2018 1430   EOSABS 0.0 06/14/2018 1430   BASOSABS 0.0 06/14/2018 1430    CMP     Component Value Date/Time   NA 136 06/14/2018 1430   K 4.1 06/14/2018 1430   CL 98 06/14/2018 1430   CO2 28 06/14/2018 1430   GLUCOSE 176 (H) 06/14/2018 1430   BUN 18 06/14/2018 1430   CREATININE 1.17 (H) 06/14/2018 1430   CALCIUM 8.8 (L) 06/14/2018 1430   PROT 6.5 06/14/2018 1430   ALBUMIN 3.9 06/14/2018 1430   AST 62 (H) 06/14/2018 1430   ALT 69 (H) 06/14/2018 1430   ALKPHOS 97 06/14/2018 1430   BILITOT 0.5 06/14/2018 1430   GFRNONAA 48 (L) 06/14/2018 1430   GFRAA 55 (L) 06/14/2018 1430     Assessment and Plan: 1. 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 ([place])   2. Dehydration   3. Chemotherapy-induced neutropenia ([facility])   4. Thrush    #Labs are reviewed and discussed with patient. She has had 2 cycles of dose dense AC with mild to moderate difficulties. #Thrush, improved after using nystatin oral rinse.  Continue. #Chemotherapy-induced neutropenia, neutropenia precaution discussed with patient again. #AKI/dehydration, likely secondary to nausea and poor p.o. intake. Nausea has improved.  Continue Phenergan as instructed. We will proceed with 1 L of IV fluid on 06/16/2018 to improve hydration. If she continues to feel nausea will also provide her IV nausea medication along with hydration.  Anemia, chemotherapy-induced, continue to monitor.  Nausea without vomiting, continue antiemetics as instructed.  Follow Up Instructions: 1 week for lab and MD assessment prior to cycle 3 dose dense AC  I discussed the assessment and treatment plan with the patient. The patient was provided an opportunity to ask questions and all were answered. The patient agreed with the plan and demonstrated an understanding of the instructions.  The patient was advised to call back or  seek an in-person evaluation if the symptoms worsen or if the condition fails to improve as anticipated.   I provided 15 minutes of face-to-face video visit time during this encounter, and > 50% was spent counseling as documented under my assessment & plan.   Earlie Server, MD 06/18/2018 4:18 PM

## 2018-06-20 ENCOUNTER — Inpatient Hospital Stay: Payer: PPO | Attending: Nurse Practitioner | Admitting: Nurse Practitioner

## 2018-06-20 ENCOUNTER — Encounter: Payer: Self-pay | Admitting: Nurse Practitioner

## 2018-06-20 ENCOUNTER — Other Ambulatory Visit: Payer: Self-pay

## 2018-06-20 DIAGNOSIS — Z171 Estrogen receptor negative status [ER-]: Secondary | ICD-10-CM

## 2018-06-20 DIAGNOSIS — D701 Agranulocytosis secondary to cancer chemotherapy: Secondary | ICD-10-CM | POA: Insufficient documentation

## 2018-06-20 DIAGNOSIS — Z5111 Encounter for antineoplastic chemotherapy: Secondary | ICD-10-CM | POA: Insufficient documentation

## 2018-06-20 DIAGNOSIS — Z5189 Encounter for other specified aftercare: Secondary | ICD-10-CM | POA: Insufficient documentation

## 2018-06-20 DIAGNOSIS — Z9221 Personal history of antineoplastic chemotherapy: Secondary | ICD-10-CM | POA: Diagnosis not present

## 2018-06-20 DIAGNOSIS — C50919 Malignant neoplasm of unspecified site of unspecified female breast: Secondary | ICD-10-CM | POA: Diagnosis not present

## 2018-06-20 DIAGNOSIS — R110 Nausea: Secondary | ICD-10-CM | POA: Insufficient documentation

## 2018-06-20 DIAGNOSIS — Z79899 Other long term (current) drug therapy: Secondary | ICD-10-CM | POA: Diagnosis not present

## 2018-06-20 DIAGNOSIS — C50212 Malignant neoplasm of upper-inner quadrant of left female breast: Secondary | ICD-10-CM | POA: Insufficient documentation

## 2018-06-20 MED ORDER — TRIAMCINOLONE ACETONIDE 0.1 % EX CREA: 1.0000 "application " | TOPICAL_CREAM | Freq: Two times a day (BID) | CUTANEOUS | 0 refills | Status: DC

## 2018-06-20 NOTE — Progress Notes (Signed)
Patient contacted for symptom management telehealth visit. Complains of new rash around left knee that started last night and this morning she noticed this rash had expanded to backside waistline. Denies itching or pain, but states the site has raised bumps. She is also complaining of sciatic nerve pain to left leg.

## 2018-06-20 NOTE — Progress Notes (Signed)
Virtual Visit via Video Enabled Telemedicine Note  Sympt[facility]  [phone] [phone]    I connected with Megan Harris on 06/20/18 at 2:00 PM EST video enabled telemedicine visit and verified that I am speaking with the correct person using two identifiers.   I discussed the limitations, risks, security and privacy concerns of performing an evaluation and management service by telemedicine and the availability of in-person appointments. I also discussed with the patient that there may be a patient responsible charge related to this service. The patient expressed understanding and agreed to proceed.   Other persons participating in the visit and their role in the encounter: Megan Merl, RN, Megan Harris (patient's friend who provided tech support and held camera given location of rash).   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 ([place])   04/28/2018 Cancer Staging    Staging form: Breast, AJCC 8th Edition - Clinical stage from 04/28/2018: Stage IB (cT1c, cN0, cM0, G3, ER-, PR-, HER2-) - Signed by Earlie Server, MD on 04/29/2018    04/29/2018 Initial Diagnosis    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 ([place])    05/24/2018 -  Chemotherapy    The patient had DOXOrubicin (ADRIAMYCIN) chemo injection 136 mg, 60 mg/m2 = 136 mg, Intravenous,  Once, 2 of 4 cycles Administration: 136 mg (05/24/2018), 136 mg (06/07/2018) palonosetron (ALOXI) injection 0.25 mg,  0.25 mg, Intravenous,  Once, 2 of 4 cycles Administration: 0.25 mg (05/24/2018), 0.25 mg (06/07/2018) pegfilgrastim (NEULASTA ONPRO KIT) injection 6 mg, 6 mg, Subcutaneous, Once, 2 of 9 cycles Administration: 6 mg (05/24/2018), 6 mg (06/07/2018) cyclophosphamide (CYTOXAN) 1,360 mg in sodium chloride 0.9 % 250 mL chemo infusion, 600 mg/m2 = 1,360 mg, Intravenous,  Once, 2 of 4 cycles Administration: 1,360 mg (05/24/2018), 1,360 mg (06/07/2018) PACLitaxel (TAXOL) 180 mg in sodium chloride 0.9 % 250 mL chemo infusion (</= 22m/m2), 80 mg/m2, Intravenous,  Once, 0 of 12 cycles fosaprepitant (EMEND) 150 mg, dexamethasone (DECADRON) 12 mg in sodium chloride 0.9 % 145 mL IVPB, , Intravenous,  Once, 2 of 4 cycles Administration:  (05/24/2018),  (06/07/2018)  for chemotherapy treatment.     05/25/2018 Cancer Staging    Staging form: Breast, AJCC 8th Edition - Pathologic stage from 05/25/2018: Stage IB (pT1c, pN0(sn), cM0, G3, ER-, PR-, HER2-) - Signed by YEarlie Server MD on 05/25/2018     Interval history- Megan Harris 68year old female with above history of triple negative breast cancer who presents to symptom management clinic for rash.  She localizes rash to initial spots showing up on lower back 3 days ago.  Yesterday she noticed additional spot on left lower leg and left inside knee.  She describes as being raised spots surrounded by redness.  Estimates they are size of half dollar.  Denies itching, burning.  Denies any change in fabric softeners or laundry detergent.  No new creams or ointments.  She applied [employer] to location once without significant improvement.  Yesterday she tried anti-itch cream without improvement.  ECOG FS:1 - Symptomatic but completely ambulatory  Review of systems- Review of Systems  Constitutional: Negative.   Respiratory: Negative.   Cardiovascular: Negative.   Gastrointestinal: Negative.   Musculoskeletal: Negative for joint pain and neck pain.  Skin: Positive for rash. Negative  for itching.  Psychiatric/Behavioral: Negative.      Current treatment-status post 2 cycles of dose dense Adriamycin-Cytoxan  Allergies  Allergen Reactions  . Codeine Other (See Comments)    Goes to sleep and unable to wake up.  Marland Kitchen Penicillins Hives    Did it involve swelling of the face/tongue/throat, SOB, or low BP? No Did it involve sudden or severe rash/hives, skin peeling, or any reaction on the inside of your mouth or nose? No Did you need to seek medical attention at a hospital or doctor's office? Yes When did it last happen?50s If all above answers are "NO", may proceed with cephalosporin use.     Past Medical History:  Diagnosis Date  . Abdominal pain, left lower quadrant 2012  . Anemia    vitamin d deficiency  . Arthritis   . Asthma   . Back pain 2005   lower back arthritis  . Cancer ([place]) 04/26/2018   19m, T1c, N0 triple negative  . Chronic cholecystitis 2012   has had choleycystectomy.  not an issue  . Constipation 2005  . Endocrine problem 2005  . Family history of bone cancer   . Family history of brain cancer   . Family history of colon cancer   . Family history of lung cancer   . GERD (gastroesophageal reflux disease) 2012  . Hyperlipidemia 2012  . Hypertension 2002  . Nausea with vomiting 2012  . Obesity, unspecified 2012  . Pneumonia   . PONV (postoperative nausea and vomiting)   . Special screening for malignant neoplasms, colon 2012    Past Surgical History:  Procedure Laterality Date  . BILATERAL CARPAL TUNNEL RELEASE Bilateral   . BREAST BIOPSY Left 04/13/2018   Pending Path  . BREAST EXCISIONAL BIOPSY Left early 90s   neg  . BREAST LUMPECTOMY Left 04/26/2018   Procedure: BREAST LUMPECTOMY WITH EXCISION OF SENTINEL NODE;  Surgeon: BRobert Bellow MD;  Location: [facility] ORS;  Service: General;  Laterality: Left;  . BUNIONECTOMY Bilateral 2003  . CHOLECYSTECTOMY  06/04/2010  . COLON RESECTION  2004   due diverticulitis   .  COLONOSCOPY  2005   [place]. BBary Castilla . COLONOSCOPY  06/28/2012  . COLOSTOMY  2004  . COLOSTOMY REVERSAL  2004  . FOOT SURGERY Bilateral 2012   plantar faciatis  . HAND SURGERY Bilateral    carpal tunnel  . HERNIA REPAIR  2005   at colostomy site after reversal done  . JOINT REPLACEMENT    . KNEE ARTHROPLASTY Right 04/20/2017   Procedure: COMPUTER ASSISTED TOTAL KNEE ARTHROPLASTY;  Surgeon: HDereck Leep MD;  Location: [facility] ORS;  Service: Orthopedics;  Laterality: Right;  . KNEE ARTHROSCOPY Right 02/03/2015   Procedure: ARTHROSCOPY right knee, partial medial menisectomy, condyle malleolus, patella and femoral;  Surgeon: JDereck Leep MD;  Location: [facility] ORS;  Service: Orthopedics;  Laterality: Right;  . NASAL SINUS SURGERY     2005  . PORTACATH PLACEMENT Right 04/26/2018   Procedure: INSERTION PORT-A-CATH RIGHT;  Surgeon: BRobert Bellow MD;  Location: [facility] ORS;  Service: General;  Laterality: Right;  . SALPINGOOPHORECTOMY  1998  . TONSILLECTOMY    . TUBAL LIGATION  1978  . UPPER GI ENDOSCOPY  06/28/2012  . VAGINAL HYSTERECTOMY  1987    Social History   Socioeconomic History  . Marital status: Married    Spouse name: Francee Piccolo  . Number of children: 2  . Years of education: Not on file  . Highest education level: Not on file  Occupational History  . 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 Financial resource strain: Not on file  . Food insecurity:    Worry: Not on file    Inability: Not on file  . Transportation needs:    Medical: Not on file    Non-medical: Not on file  Tobacco Use  . Smoking status: Never Smoker  . Smokeless tobacco: Never Used  Substance and Sexual Activity  . Alcohol use: Yes    Comment: socially  . Drug use: No  . Sexual activity: Yes    Birth control/protection: Surgical  Lifestyle  . Physical activity:    Days per week: Not on file    Minutes per session: Not on file  . Stress: Not on file   Relationships  . Social connections:    Talks on phone: Not on file    Gets together: Not on file    Attends religious service: Not on file    Active member of club or organization: Not on file    Attends meetings of clubs or organizations: Not on file    Relationship status: Not on file  . Intimate partner violence:    Fear of current or ex partner: Not on file    Emotionally abused: Not on file    Physically abused: Not on file    Forced sexual activity: Not on file  Other Topics Concern  . Not on file  Social History Narrative  . Not on file    Family History  Problem Relation Age of Onset  . Heart disease Father   . Diabetes Paternal Grandmother   . Diabetes Paternal Grandfather   . Brain cancer Mother 76  . Bone cancer Sister 41  . Colon cancer Maternal Uncle        dx 50s-60s  . Throat cancer Cousin   . Leukemia Cousin   . Lung cancer Cousin   . Ovarian cancer Neg Hx      Current Outpatient Medications:  .  acetaminophen (TYLENOL) 500 MG tablet, Take 500 mg by mouth every 6 (six) hours as needed., Disp: , Rfl:  .  albuterol (PROVENTIL HFA;VENTOLIN HFA) 108 (90 Base) MCG/ACT inhaler, Inhale 2 puffs into the lungs every 6 (six) hours as needed for wheezing or shortness of breath. , Disp: , Rfl:  .  amLODipine (NORVASC) 5 MG tablet, Take 5 mg by mouth daily., Disp: , Rfl:  .  atorvastatin (LIPITOR) 10 MG tablet, Take 10 mg by mouth daily., Disp: , Rfl:  .  Cholecalciferol (VITAMIN D3 MAXIMUM STRENGTH) 5000 units capsule, Take 5,000 Units by mouth daily., Disp: , Rfl:  .  dextromethorphan-guaiFENesin (MUCINEX DM) 30-600 MG 12hr tablet, Take 1 tablet by mouth daily as needed for cough., Disp: , Rfl:  .  gabapentin (NEURONTIN) 300 MG capsule, Take 300 mg by mouth 3 (three) times daily., Disp: , Rfl:  .  hydrochlorothiazide (HYDRODIURIL) 25 MG tablet, Take 25 mg by mouth daily., Disp: , Rfl:  .  lansoprazole (PREVACID) 30 MG capsule, Take 30 mg by mouth 2 (two) times  daily. , Disp: , Rfl:  .  levothyroxine (SYNTHROID, LEVOTHROID) 50  MCG tablet, Take 50 mcg by mouth daily before breakfast. , Disp: , Rfl:  .  lidocaine-prilocaine (EMLA) cream, Apply to affected area once, Disp: 30 g, Rfl: 3 .  losartan (COZAAR) 100 MG tablet, Take 100 mg by mouth daily., Disp: , Rfl:  .  nystatin (MYCOSTATIN) 100000 UNIT/ML suspension, Take 5 mLs (500,000 Units total) by mouth 4 (four) times daily., Disp: 473 mL, Rfl: 0 .  ondansetron (ZOFRAN) 8 MG tablet, Take 1 tablet (8 mg total) by mouth 2 (two) times daily as needed. Start on the third day after chemotherapy., Disp: 60 tablet, Rfl: 1 .  potassium chloride SA (K-DUR) 20 MEQ tablet, Take 1 tablet (20 mEq total) by mouth daily., Disp: 3 tablet, Rfl: 0 .  promethazine (PHENERGAN) 25 MG tablet, Take 1 tablet (25 mg total) by mouth every 6 (six) hours as needed for nausea or vomiting., Disp: 30 tablet, Rfl: 1 .  zolpidem (AMBIEN) 10 MG tablet, Take 10 mg by mouth at bedtime. , Disp: , Rfl:  .  chlorhexidine (PERIDEX) 0.12 % solution, Use as directed 15 mLs in the mouth or throat 2 (two) times daily. (Patient not taking: Reported on 06/15/2018), Disp: 473 mL, Rfl: 1  Physical exam: Exam limited due to telemedicine  There were no vitals filed for this visit. Physical Exam Constitutional:      General: She is not in acute distress.    Appearance: Normal appearance.     Comments: Accompanied by friend, Megan Harris  HENT:     Head:     Comments: Alopecia Eyes:     General: No scleral icterus. Skin:    Findings: Rash present. Rash is papular (Papular, reddened lesions identified.  Do not appear urticarial or vesicular.  No evidence of swelling.).  Neurological:     Mental Status: She is alert and oriented to person, place, and time.  Psychiatric:        Mood and Affect: Mood normal.        Behavior: Behavior normal.      CMP Latest Ref Rng & Units 06/14/2018  Glucose 70 - 99 mg/dL 176(H)  BUN 8 - 23 mg/dL 18  Creatinine 0.44 -  1.00 mg/dL 1.17(H)  Sodium 135 - 145 mmol/L 136  Potassium 3.5 - 5.1 mmol/L 4.1  Chloride 98 - 111 mmol/L 98  CO2 22 - 32 mmol/L 28  Calcium 8.9 - 10.3 mg/dL 8.8(L)  Total Protein 6.5 - 8.1 g/dL 6.5  Total Bilirubin 0.3 - 1.2 mg/dL 0.5  Alkaline Phos 38 - 126 U/L 97  AST 15 - 41 U/L 62(H)  ALT 0 - 44 U/L 69(H)   CBC Latest Ref Rng & Units 06/14/2018  WBC 4.0 - 10.5 K/uL 0.6(LL)  Hemoglobin 12.0 - 15.0 g/dL 11.7(L)  Hematocrit 36.0 - 46.0 % 35.4(L)  Platelets 150 - 400 K/uL 180    No images are attached to the encounter.  No results found.  Assessment and plan- Patient is a 68 y.o. female diagnosed with breast cancer who presents since to symptom management clinic for rash.  1.  Triple negative malignant neoplasm of the breast-currently status post 2 cycles of dose dense AC chemotherapy.  Cycle 3 planned for tomorrow.  Follow-up with Dr. Tasia Catchings as scheduled.  2.  Rash- No evidence of secondary infection.  Suspect contact dermatitis though etiology unclear.  Start triamcinolone cream 0.1% twice daily to affected areas for 1 week.  Call clinic if symptoms do not improve or worsen over the next  3-4 days.  Patient is seeing Dr. Tasia Catchings via telemedicine in between her chemotherapy cycles.  Please follow-up with her regarding symptoms at that time or consider referring back to symptom management if needed.   Visit Diagnosis No diagnosis found.  Patient expressed understanding and was in agreement with this plan. She also understands that She can call clinic at any time with any questions, concerns, or complaints.   I discussed the assessment and treatment plan with the patient. The patient was provided an opportunity to ask questions and all were answered. The patient agreed with the plan and demonstrated an understanding of the instructions.   The patient was advised to call back or seek an in-person evaluation if the symptoms worsen or if the condition fails to improve as anticipated.   I  provided 12 minutes of face-to-face video visit time during this encounter, and > 50% was spent counseling as documented under my assessment & plan.  Thank you for allowing me to participate in the care of this very pleasant patient.   Beckey Rutter, DNP, AGNP-C [place] [facility] [phone] (work cell) [phone] (office)  CC: Dr. Tasia Catchings

## 2018-06-21 ENCOUNTER — Other Ambulatory Visit: Payer: Self-pay

## 2018-06-21 ENCOUNTER — Inpatient Hospital Stay (HOSPITAL_BASED_OUTPATIENT_CLINIC_OR_DEPARTMENT_OTHER): Payer: PPO | Admitting: Oncology

## 2018-06-21 ENCOUNTER — Inpatient Hospital Stay: Payer: PPO

## 2018-06-21 ENCOUNTER — Telehealth: Payer: Self-pay | Admitting: *Deleted

## 2018-06-21 ENCOUNTER — Encounter: Payer: Self-pay | Admitting: Oncology

## 2018-06-21 VITALS — BP 125/83 | HR 81 | Temp 98.6°F | Resp 18 | Wt 246.5 lb

## 2018-06-21 DIAGNOSIS — C50212 Malignant neoplasm of upper-inner quadrant of left female breast: Secondary | ICD-10-CM

## 2018-06-21 DIAGNOSIS — M545 Low back pain: Secondary | ICD-10-CM

## 2018-06-21 DIAGNOSIS — M5431 Sciatica, right side: Secondary | ICD-10-CM | POA: Diagnosis not present

## 2018-06-21 DIAGNOSIS — Z5111 Encounter for antineoplastic chemotherapy: Secondary | ICD-10-CM | POA: Diagnosis not present

## 2018-06-21 DIAGNOSIS — B09 Unspecified viral infection characterized by skin and mucous membrane lesions: Secondary | ICD-10-CM

## 2018-06-21 DIAGNOSIS — R21 Rash and other nonspecific skin eruption: Secondary | ICD-10-CM | POA: Diagnosis not present

## 2018-06-21 DIAGNOSIS — D701 Agranulocytosis secondary to cancer chemotherapy: Secondary | ICD-10-CM | POA: Diagnosis not present

## 2018-06-21 DIAGNOSIS — C50919 Malignant neoplasm of unspecified site of unspecified female breast: Secondary | ICD-10-CM

## 2018-06-21 DIAGNOSIS — E876 Hypokalemia: Secondary | ICD-10-CM | POA: Diagnosis not present

## 2018-06-21 DIAGNOSIS — B370 Candidal stomatitis: Secondary | ICD-10-CM | POA: Diagnosis not present

## 2018-06-21 DIAGNOSIS — Z5189 Encounter for other specified aftercare: Secondary | ICD-10-CM | POA: Diagnosis not present

## 2018-06-21 DIAGNOSIS — T451X5A Adverse effect of antineoplastic and immunosuppressive drugs, initial encounter: Secondary | ICD-10-CM

## 2018-06-21 DIAGNOSIS — Z171 Estrogen receptor negative status [ER-]: Secondary | ICD-10-CM

## 2018-06-21 DIAGNOSIS — R110 Nausea: Secondary | ICD-10-CM | POA: Diagnosis not present

## 2018-06-21 LAB — CBC WITH DIFFERENTIAL/PLATELET
Abs Immature Granulocytes: 1.27 10*3/uL — ABNORMAL HIGH (ref 0.00–0.07)
Basophils Absolute: 0.1 10*3/uL (ref 0.0–0.1)
Basophils Relative: 1 %
Eosinophils Absolute: 0.1 10*3/uL (ref 0.0–0.5)
Eosinophils Relative: 1 %
HCT: 36.4 % (ref 36.0–46.0)
Hemoglobin: 12 g/dL (ref 12.0–15.0)
Immature Granulocytes: 15 %
Lymphocytes Relative: 6 %
Lymphs Abs: 0.6 10*3/uL — ABNORMAL LOW (ref 0.7–4.0)
MCH: 28.9 pg (ref 26.0–34.0)
MCHC: 33 g/dL (ref 30.0–36.0)
MCV: 87.7 fL (ref 80.0–100.0)
Monocytes Absolute: 0.8 10*3/uL (ref 0.1–1.0)
Monocytes Relative: 9 %
Neutro Abs: 6 10*3/uL (ref 1.7–7.7)
Neutrophils Relative %: 68 %
Platelets: 158 10*3/uL (ref 150–400)
RBC: 4.15 MIL/uL (ref 3.87–5.11)
RDW: 14.8 % (ref 11.5–15.5)
Smear Review: NORMAL
WBC: 8.8 10*3/uL (ref 4.0–10.5)
nRBC: 0.5 % — ABNORMAL HIGH (ref 0.0–0.2)

## 2018-06-21 LAB — COMPREHENSIVE METABOLIC PANEL
ALT: 45 U/L — ABNORMAL HIGH (ref 0–44)
AST: 35 U/L (ref 15–41)
Albumin: 3.7 g/dL (ref 3.5–5.0)
Alkaline Phosphatase: 81 U/L (ref 38–126)
Anion gap: 11 (ref 5–15)
BUN: 11 mg/dL (ref 8–23)
CO2: 27 mmol/L (ref 22–32)
Calcium: 8.7 mg/dL — ABNORMAL LOW (ref 8.9–10.3)
Chloride: 100 mmol/L (ref 98–111)
Creatinine, Ser: 0.89 mg/dL (ref 0.44–1.00)
GFR calc Af Amer: >60 mL/min (ref >60)
GFR calc non Af Amer: >60 mL/min (ref >60)
Glucose, Bld: 167 mg/dL — ABNORMAL HIGH (ref 70–99)
Potassium: 3.3 mmol/L — ABNORMAL LOW (ref 3.5–5.1)
Sodium: 138 mmol/L (ref 135–145)
Total Bilirubin: 0.5 mg/dL (ref 0.3–1.2)
Total Protein: 6.6 g/dL (ref 6.5–8.1)

## 2018-06-21 MED ORDER — SODIUM CHLORIDE 0.9 % IV SOLN
Freq: Once | INTRAVENOUS | Status: AC
  Administered 2018-06-21: 10:00:00 via INTRAVENOUS
  Filled 2018-06-21: qty 250

## 2018-06-21 MED ORDER — HEPARIN SOD (PORK) LOCK FLUSH 100 UNIT/ML IV SOLN
500.0000 [IU] | Freq: Once | INTRAVENOUS | Status: AC
  Administered 2018-06-21: 500 [IU] via INTRAVENOUS
  Filled 2018-06-21: qty 5

## 2018-06-21 MED ORDER — SODIUM CHLORIDE 0.9% FLUSH
10.0000 mL | INTRAVENOUS | Status: DC | PRN
  Administered 2018-06-21: 10 mL via INTRAVENOUS
  Filled 2018-06-21: qty 10

## 2018-06-21 MED ORDER — VALACYCLOVIR HCL 1 G PO TABS: 1000.0000 mg | ORAL_TABLET | Freq: Two times a day (BID) | ORAL | 0 refills | Status: DC

## 2018-06-21 MED ORDER — PEGFILGRASTIM 6 MG/0.6ML ~~LOC~~ PSKT
6.0000 mg | PREFILLED_SYRINGE | Freq: Once | SUBCUTANEOUS | Status: AC
  Administered 2018-06-21: 12:00:00 6 mg via SUBCUTANEOUS
  Filled 2018-06-21: qty 0.6

## 2018-06-21 MED ORDER — PALONOSETRON HCL INJECTION 0.25 MG/5ML
0.2500 mg | Freq: Once | INTRAVENOUS | Status: AC
  Administered 2018-06-21: 0.25 mg via INTRAVENOUS
  Filled 2018-06-21: qty 5

## 2018-06-21 MED ORDER — POTASSIUM CHLORIDE ER 10 MEQ PO TBCR: 10.0000 meq | EXTENDED_RELEASE_TABLET | Freq: Every day | ORAL | 0 refills | Status: DC

## 2018-06-21 MED ORDER — SODIUM CHLORIDE 0.9 % IV SOLN
Freq: Once | INTRAVENOUS | Status: AC
  Administered 2018-06-21: 11:00:00 via INTRAVENOUS
  Filled 2018-06-21: qty 5

## 2018-06-21 MED ORDER — DOXORUBICIN HCL CHEMO IV INJECTION 2 MG/ML
60.0000 mg/m2 | Freq: Once | INTRAVENOUS | Status: AC
  Administered 2018-06-21: 11:00:00 136 mg via INTRAVENOUS
  Filled 2018-06-21: qty 68

## 2018-06-21 MED ORDER — SODIUM CHLORIDE 0.9 % IV SOLN
600.0000 mg/m2 | Freq: Once | INTRAVENOUS | Status: AC
  Administered 2018-06-21: 11:00:00 1360 mg via INTRAVENOUS
  Filled 2018-06-21: qty 50

## 2018-06-21 MED ORDER — TRAMADOL HCL 50 MG PO TABS: 50.0000 mg | ORAL_TABLET | Freq: Two times a day (BID) | ORAL | 0 refills | Status: DC | PRN

## 2018-06-21 NOTE — Progress Notes (Signed)
Hematology/Oncology progress note [facility] Telephone:([phone] [phone]   Patient Care Team: Idelle Crouch, MD as PCP - General (Internal Medicine) Bary Castilla Forest Gleason, MD as Consulting Physician (General Surgery)  REFERRING PROVIDER: Idelle Crouch, MD REASON FOR VISIT:  Follow up for adjuvant chemotherapy for breast cancer.   HISTORY OF PRESENTING ILLNESS:  Megan Harris is a  68 y.o.  female with PMH listed below who was referred to me for evaluation of breast cancer  Patient had screening mammogram on 03/27/2018 which showed left breast mass. She had diagnostic and ultrasound on 04/07/2018 which showed 0.9cm x 0.9cm x 1.4cm irregular border mixed echotexture mass at the left breast 11 o'clock 7 cm from nipple correlating to the mammographic finding. Ultrasound of the left axilla is negative She underwent biopsy of left breast mass.   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 Estrogen receptor negative, progesterone receptor negative, HER-2 negative.  Nipple discharge: Denies Family history: Paternal grandmother had breast cancer, mother deceased from brain cancer, sister deceased from bone cancer, maternal uncle had colon cancer. OCP use: Short-term use remotely Estrogen and progesterone therapy: denies History of radiation to chest: denies.  Previous breast surgery: Biopsy of left breast in early 90s.  Negative per patient.  History of complete hysterectomy due to endometriosis.  05/09/2018 Baseline MUGA showed no focal wall motion abnormality of the left ventricle.  Calculated LVEF 61%  INTERVAL HISTORY Megan Harris is a 67 y.o. 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  #Patient reports ongoing  lower back pain, shooting down to lower extremity. Patient has been taking ibuprofen 600 mg every 8 hours as needed.  Also on gabapentin.  #Also report developing rash on her sacral area, inner side of left knee. Denies any itching or burning sensation.  Patient was seen by symptom management nurse practitioner yesterday and was prescribed steroid cream for possible contact dermatitis.  Patient reports that the rash has gotten worse since yesterday.  No exacerbating or alleviating factors. Denies any fever or chills.  Appetite is good. Nausea is betther controlled with Phenergan. Mucositis/thrush improved.  She has been using mouth rinse.  Review of Systems  Constitutional: Positive for fatigue. Negative for appetite change, chills and fever.  HENT:   Negative for hearing loss and voice change.   Eyes: Negative for eye problems.  Respiratory: Negative for chest tightness and cough.   Cardiovascular: Negative for chest pain.  Gastrointestinal: Negative for abdominal distention, abdominal pain and blood in stool.  Endocrine: Negative for hot flashes.  Genitourinary: Negative for difficulty urinating and frequency.   Musculoskeletal: Negative for arthralgias.  Skin: Positive for rash. Negative for itching.  Neurological: Negative for extremity weakness.  Hematological: Negative for adenopathy.  Psychiatric/Behavioral: Negative for confusion. The patient is not nervous/anxious.     MEDICAL HISTORY:  Past Medical History:  Diagnosis Date  . Abdominal pain, left lower quadrant 2012  . Anemia    vitamin d deficiency  . Arthritis   . Asthma   . Back pain 2005   lower back arthritis  . Cancer ([place]) 04/26/2018   51m, T1c, N0 triple negative  . Chronic cholecystitis 2012   has had choleycystectomy.  not an issue  . Constipation 2005  . Endocrine problem 2005  . Family  history of bone cancer   . Family history of brain cancer   . Family history of colon cancer   . Family history of  lung cancer   . GERD (gastroesophageal reflux disease) 2012  . Hyperlipidemia 2012  . Hypertension 2002  . Nausea with vomiting 2012  . Obesity, unspecified 2012  . Pneumonia   . PONV (postoperative nausea and vomiting)   . Special screening for malignant neoplasms, colon 2012    SURGICAL HISTORY: Past Surgical History:  Procedure Laterality Date  . BILATERAL CARPAL TUNNEL RELEASE Bilateral   . BREAST BIOPSY Left 04/13/2018   Pending Path  . BREAST EXCISIONAL BIOPSY Left early 90s   neg  . BREAST LUMPECTOMY Left 04/26/2018   Procedure: BREAST LUMPECTOMY WITH EXCISION OF SENTINEL NODE;  Surgeon: Robert Bellow, MD;  Location: [facility] ORS;  Service: General;  Laterality: Left;  . BUNIONECTOMY Bilateral 2003  . CHOLECYSTECTOMY  06/04/2010  . COLON RESECTION  2004   due diverticulitis   . COLONOSCOPY  2005   [place], Dr. Bary Castilla  . COLONOSCOPY  06/28/2012  . COLOSTOMY  2004  . COLOSTOMY REVERSAL  2004  . FOOT SURGERY Bilateral 2012   plantar faciatis  . HAND SURGERY Bilateral    carpal tunnel  . HERNIA REPAIR  2005   at colostomy site after reversal done  . JOINT REPLACEMENT    . KNEE ARTHROPLASTY Right 04/20/2017   Procedure: COMPUTER ASSISTED TOTAL KNEE ARTHROPLASTY;  Surgeon: Dereck Leep, MD;  Location: [facility] ORS;  Service: Orthopedics;  Laterality: Right;  . KNEE ARTHROSCOPY Right 02/03/2015   Procedure: ARTHROSCOPY right knee, partial medial menisectomy, condyle malleolus, patella and femoral;  Surgeon: Dereck Leep, MD;  Location: [facility] ORS;  Service: Orthopedics;  Laterality: Right;  . NASAL SINUS SURGERY     2005  . PORTACATH PLACEMENT Right 04/26/2018   Procedure: INSERTION PORT-A-CATH RIGHT;  Surgeon: Robert Bellow, MD;  Location: [facility] ORS;  Service: General;  Laterality: Right;  . SALPINGOOPHORECTOMY  1998  . TONSILLECTOMY    . TUBAL LIGATION  1978  . UPPER GI ENDOSCOPY  06/28/2012  . VAGINAL HYSTERECTOMY  1987    SOCIAL HISTORY: Social History    Socioeconomic History  . Marital status: Married    Spouse name: Francee Piccolo  . Number of children: 2  . Years of education: Not on file  . Highest education level: Not on file  Occupational History  . 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 Financial resource strain: Not on file  . Food insecurity:    Worry: Not on file    Inability: Not on file  . Transportation needs:    Medical: Not on file    Non-medical: Not on file  Tobacco Use  . Smoking status: Never Smoker  . Smokeless tobacco: Never Used  Substance and Sexual Activity  . Alcohol use: Yes    Comment: socially  . Drug use: No  . Sexual activity: Yes    Birth control/protection: Surgical  Lifestyle  . Physical activity:    Days per week: Not on file    Minutes per session: Not on file  . Stress: Not on file  Relationships  . Social connections:    Talks on phone: Not on file    Gets together: Not on file    Attends religious service: Not on file    Active member of club or organization: Not on file  Attends meetings of clubs or organizations: Not on file    Relationship status: Not on file  . Intimate partner violence:    Fear of current or ex partner: Not on file    Emotionally abused: Not on file    Physically abused: Not on file    Forced sexual activity: Not on file  Other Topics Concern  . Not on file  Social History Narrative  . Not on file   Lives at home with husband.  FAMILY HISTORY: Family History  Problem Relation Age of Onset  . Heart disease Father   . Diabetes Paternal Grandmother   . Diabetes Paternal Grandfather   . Brain cancer Mother 80  . Bone cancer Sister 75  . Colon cancer Maternal Uncle        dx 50s-60s  . Throat cancer Cousin   . Leukemia Cousin   . Lung cancer Cousin   . Ovarian cancer Neg Hx     ALLERGIES:  is allergic to codeine and penicillins.  MEDICATIONS:  Current Outpatient Medications  Medication Sig Dispense Refill  .  acetaminophen (TYLENOL) 500 MG tablet Take 500 mg by mouth every 6 (six) hours as needed.    Marland Kitchen albuterol (PROVENTIL HFA;VENTOLIN HFA) 108 (90 Base) MCG/ACT inhaler Inhale 2 puffs into the lungs every 6 (six) hours as needed for wheezing or shortness of breath.     Marland Kitchen amLODipine (NORVASC) 5 MG tablet Take 5 mg by mouth daily.    Marland Kitchen atorvastatin (LIPITOR) 10 MG tablet Take 10 mg by mouth daily.    . Cholecalciferol (VITAMIN D3 MAXIMUM STRENGTH) 5000 units capsule Take 5,000 Units by mouth daily.    Marland Kitchen dextromethorphan-guaiFENesin (MUCINEX DM) 30-600 MG 12hr tablet Take 1 tablet by mouth daily as needed for cough.    . gabapentin (NEURONTIN) 300 MG capsule Take 300 mg by mouth 3 (three) times daily.    . hydrochlorothiazide (HYDRODIURIL) 25 MG tablet Take 25 mg by mouth daily.    . lansoprazole (PREVACID) 30 MG capsule Take 30 mg by mouth 2 (two) times daily.     Marland Kitchen levothyroxine (SYNTHROID, LEVOTHROID) 50 MCG tablet Take 50 mcg by mouth daily before breakfast.     . lidocaine-prilocaine (EMLA) cream Apply to affected area once 30 g 3  . losartan (COZAAR) 100 MG tablet Take 100 mg by mouth daily.    Marland Kitchen nystatin (MYCOSTATIN) 100000 UNIT/ML suspension Take 5 mLs (500,000 Units total) by mouth 4 (four) times daily. 473 mL 0  . ondansetron (ZOFRAN) 8 MG tablet Take 1 tablet (8 mg total) by mouth 2 (two) times daily as needed. Start on the third day after chemotherapy. 60 tablet 1  . promethazine (PHENERGAN) 25 MG tablet Take 1 tablet (25 mg total) by mouth every 6 (six) hours as needed for nausea or vomiting. 30 tablet 1  . zolpidem (AMBIEN) 10 MG tablet Take 10 mg by mouth at bedtime.     . chlorhexidine (PERIDEX) 0.12 % solution Use as directed 15 mLs in the mouth or throat 2 (two) times daily. (Patient not taking: Reported on 06/15/2018) 473 mL 1  . potassium chloride (K-DUR) 10 MEQ tablet Take 1 tablet (10 mEq total) by mouth daily. 14 tablet 0  . valACYclovir (VALTREX) 1000 MG tablet Take 1 tablet (1,000  mg total) by mouth 2 (two) times daily. 14 tablet 0   No current facility-administered medications for this visit.      PHYSICAL EXAMINATION: ECOG PERFORMANCE STATUS: 0 - Asymptomatic  Vitals:   06/21/18 0919  BP: 125/83  Pulse: 81  Resp: 18  Temp: 98.6 F (37 C)   Filed Weights   06/21/18 0919  Weight: 246 lb 8 oz (111.8 kg)    Physical Exam Constitutional:      General: She is not in acute distress.    Appearance: She is obese.  HENT:     Head: Normocephalic and atraumatic.  Eyes:     General: No scleral icterus.    Pupils: Pupils are equal, round, and reactive to light.  Neck:     Musculoskeletal: Normal range of motion and neck supple.  Cardiovascular:     Rate and Rhythm: Normal rate and regular rhythm.     Heart sounds: Normal heart sounds.  Pulmonary:     Effort: Pulmonary effort is normal. No respiratory distress.     Breath sounds: No wheezing.  Abdominal:     General: Bowel sounds are normal. There is no distension.     Palpations: Abdomen is soft. There is no mass.     Tenderness: There is no abdominal tenderness.  Musculoskeletal: Normal range of motion.        General: No deformity.  Skin:    General: Skin is warm and dry.     Findings: No erythema or rash.     Comments: Quarter size patch of raised erythematous rash with vesicles  Neurological:     Mental Status: She is alert and oriented to person, place, and time.     Cranial Nerves: No cranial nerve deficit.     Coordination: Coordination normal.  Psychiatric:        Behavior: Behavior normal.        Thought Content: Thought content normal.       LABORATORY DATA:  I have reviewed the data as listed Lab Results  Component Value Date   WBC 8.8 06/21/2018   HGB 12.0 06/21/2018   HCT 36.4 06/21/2018   MCV 87.7 06/21/2018   PLT 158 06/21/2018   Recent Labs    06/07/18 0839 06/14/18 1430 06/21/18 0859  NA 137 136 138  K 3.3* 4.1 3.3*  CL 99 98 100  CO2 25 28 27   GLUCOSE 212*  176* 167*  BUN 11 18 11   CREATININE 0.85 1.17* 0.89  CALCIUM 8.9 8.8* 8.7*  GFRNONAA >60 48* >60  GFRAA >60 55* >60  PROT 6.6 6.5 6.6  ALBUMIN 3.8 3.9 3.7  AST 40 62* 35  ALT 50* 69* 45*  ALKPHOS 86 97 81  BILITOT 0.4 0.5 0.5   Iron/TIBC/Ferritin/ %Sat No results found for: IRON, TIBC, FERRITIN, IRONPCTSAT   Pathology 04/26/2018 CASE: [id number]  PATIENT: Megan Harris  Surgical Pathology Report DIAGNOSIS:  A. BREAST, LEFT UPPER INNER QUADRANT; WIDE EXCISION:  - INVASIVE MAMMARY CARCINOMA.  - SEE CANCER SUMMARY BELOW.  - BIOPSY SITE CHANGE WITH HEMATOMA AND RIBBON CLIP.  - UNREMARKABLE SKIN.  B. SENTINEL LYMPH NODE #1; EXCISION:  - ONE LYMPH NODE NEGATIVE FOR MALIGNANCY (0/1).  C. NEW BREAST MARGIN, SUPERIOR; EXCISION:  - BENIGN BREAST TISSUE WITH BIOPSY CHANGE AND HEMATOMA.  - NEGATIVE FOR MALIGNANCY.  CANCER CASE SUMMARY: INVASIVE CARCINOMA OF THE BREAST  Procedure: Wide excision  Specimen Laterality: Left  Tumor Size: 16 mm  Histologic Type: Invasive carcinoma of no special type (ductal)  Histologic Grade (Nottingham Histologic Score)            Glandular (Acinar)/Tubular Differentiation: 3  Nuclear Pleomorphism: 2            Mitotic Rate: 3            Overall Grade: 3  Ductal Carcinoma In Situ (DCIS): Present, nuclear grade 2-3, without comedonecrosis  Margins:    Invasive Carcinoma Margins: Uninvolved by invasive carcinoma            Distance from closest margin: 1.7 mm            Specify closest margin: Anterior       DCIS Margins: Uninvolved by DCIS            Distance from closest margin: 1.2 mm            Specify closest margin: Anterior  Regional Lymph Nodes: Uninvolved by tumor cells    Total Number of Lymph Nodes Examined: 1       Number of Sentinel Nodes Examined: 1  Treatment Effect in the Breast: No known presurgical therapy   Lymphovascular Invasion: Not identified  Pathologic Stage Classification (pTNM, AJCC 8th Edition): pT1c pN0 (sn)   Breast Biomarker Testing Performed on Previous Biopsy: [id number]  Estrogen Receptor (ER) Status: NEGATIVE  Progesterone Receptor (PgR) Status: NEGATIVE  HER2 (by immunohistochemistry): NEGATIVE     ASSESSMENT & PLAN:  1. 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 ([place])   2. Encounter for antineoplastic chemotherapy   3. Chemotherapy-induced neutropenia (HCC)   4. Thrush   5. Right sciatic nerve pain   6. Viral rash   7. Nausea without vomiting   Cancer Staging 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 [facility]) Staging form: Breast, AJCC 8th Edition - Clinical stage from 04/28/2018: Stage IB (cT1c, cN0, cM0, G3, ER-, PR-, HER2-) - Signed by Earlie Server, MD on 04/29/2018 - Pathologic stage from 05/25/2018: Stage IB (pT1c, pN0(sn), cM0, G3, ER-, PR-, HER2-) - Signed by Earlie Server, MD on 05/25/2018  Stage IB left triple negative breast cancer S/p left lumpectomy and sentinel lymph node biopsy.  Currently on adjuvant chemotherapy. Labs reviewed and discussed with patient. Counts acceptable to proceed with cycle 3 dd AC with G-CSF support Continue monitor blood counts   #Chemotherapy induced neutropenia, resolved.  [place] recovered. #Sciatic pain, continue ibuprofen 600 mg 3 times daily as needed.  Recommend patient to update her PCP if pain persists she may need image work-up as well as physical therapy.  #Skin rash, this is a new problem for her.  Contact dermatitis vs viral rash.  Discussed with patient that I will cover her with Valtrex 1056m Q12 hours,  for possible shingles.  Patient is potentially immunocompromised due to chemotherapy, rash is local, not disseminated. Based on CDC recommendation, I advise patient to completely cover rash, follow standard precautions until lesions are dry and crusted. Advised patient to contact me if rash gets worse.  #Mucositis/thrush,  continue oral rinse #Chemotherapy-induced nausea, better controlled, continue Phenergan as instructed.  #Hypokalemia, K 3.3, persistent, recommend patient to start potassium chloride 10 mEq daily.  14-day supply prescribed to pharmacy.  All questions were answered. The patient knows to call the clinic with any problems questions or concerns.  ZEarlie Server MD, PhD Hematology Oncology CReynolds [facility] Hospitalat [facility] - HausmanPager- [phone]/03/2018

## 2018-06-21 NOTE — Telephone Encounter (Signed)
Pharmacy informed to cancel prescription.

## 2018-06-21 NOTE — Telephone Encounter (Signed)
Pharmacist called reporting that patient has an allergy to Codeine and Tramadol is in that category. She wants to make sure physician wants this prescription filled or does she want to order something else. Please advise

## 2018-06-21 NOTE — Telephone Encounter (Signed)
I talked to patient again. She does not want to try. Can cancel tramadol.

## 2018-06-21 NOTE — Progress Notes (Signed)
Patient here for follow up. Complains of left leg sciatic nerve pain. She has rash to left knee and low back. Had telehealth visit with symptom management yesterday to address rash. Rash has not improved and per patient seems to "have gotten a little worse.

## 2018-06-21 NOTE — Progress Notes (Signed)
Blood return noted before, every 3cc and after Adriamycin push.

## 2018-06-27 ENCOUNTER — Other Ambulatory Visit: Payer: Self-pay

## 2018-06-28 ENCOUNTER — Other Ambulatory Visit: Payer: Self-pay

## 2018-06-28 ENCOUNTER — Encounter: Payer: Self-pay | Admitting: Oncology

## 2018-06-28 ENCOUNTER — Inpatient Hospital Stay (HOSPITAL_BASED_OUTPATIENT_CLINIC_OR_DEPARTMENT_OTHER): Payer: PPO | Admitting: Oncology

## 2018-06-28 ENCOUNTER — Inpatient Hospital Stay: Payer: PPO

## 2018-06-28 VITALS — BP 135/93 | HR 100 | Temp 98.1°F | Resp 18 | Wt 239.7 lb

## 2018-06-28 DIAGNOSIS — R110 Nausea: Secondary | ICD-10-CM | POA: Diagnosis not present

## 2018-06-28 DIAGNOSIS — B09 Unspecified viral infection characterized by skin and mucous membrane lesions: Secondary | ICD-10-CM

## 2018-06-28 DIAGNOSIS — R530 Neoplastic (malignant) related fatigue: Secondary | ICD-10-CM | POA: Diagnosis not present

## 2018-06-28 DIAGNOSIS — E876 Hypokalemia: Secondary | ICD-10-CM

## 2018-06-28 DIAGNOSIS — C50212 Malignant neoplasm of upper-inner quadrant of left female breast: Secondary | ICD-10-CM | POA: Diagnosis not present

## 2018-06-28 DIAGNOSIS — B029 Zoster without complications: Secondary | ICD-10-CM | POA: Diagnosis not present

## 2018-06-28 DIAGNOSIS — T451X5A Adverse effect of antineoplastic and immunosuppressive drugs, initial encounter: Secondary | ICD-10-CM

## 2018-06-28 DIAGNOSIS — C50919 Malignant neoplasm of unspecified site of unspecified female breast: Secondary | ICD-10-CM

## 2018-06-28 DIAGNOSIS — D701 Agranulocytosis secondary to cancer chemotherapy: Secondary | ICD-10-CM | POA: Diagnosis not present

## 2018-06-28 DIAGNOSIS — Z5111 Encounter for antineoplastic chemotherapy: Secondary | ICD-10-CM | POA: Diagnosis not present

## 2018-06-28 DIAGNOSIS — B370 Candidal stomatitis: Secondary | ICD-10-CM | POA: Diagnosis not present

## 2018-06-28 LAB — CBC WITH DIFFERENTIAL/PLATELET
Abs Immature Granulocytes: 0.06 10*3/uL (ref 0.00–0.07)
Basophils Absolute: 0 10*3/uL (ref 0.0–0.1)
Basophils Relative: 4 %
Eosinophils Absolute: 0 10*3/uL (ref 0.0–0.5)
Eosinophils Relative: 2 %
HCT: 34.3 % — ABNORMAL LOW (ref 36.0–46.0)
Hemoglobin: 11.5 g/dL — ABNORMAL LOW (ref 12.0–15.0)
Immature Granulocytes: 6 %
Lymphocytes Relative: 21 %
Lymphs Abs: 0.2 10*3/uL — ABNORMAL LOW (ref 0.7–4.0)
MCH: 28.7 pg (ref 26.0–34.0)
MCHC: 33.5 g/dL (ref 30.0–36.0)
MCV: 85.5 fL (ref 80.0–100.0)
Monocytes Absolute: 0 10*3/uL — ABNORMAL LOW (ref 0.1–1.0)
Monocytes Relative: 3 %
Neutro Abs: 0.6 10*3/uL — ABNORMAL LOW (ref 1.7–7.7)
Neutrophils Relative %: 64 %
Platelets: 123 10*3/uL — ABNORMAL LOW (ref 150–400)
RBC: 4.01 MIL/uL (ref 3.87–5.11)
RDW: 14.3 % (ref 11.5–15.5)
WBC: 1 10*3/uL — CL (ref 4.0–10.5)
nRBC: 0 % (ref 0.0–0.2)

## 2018-06-28 LAB — COMPREHENSIVE METABOLIC PANEL
ALT: 85 U/L — ABNORMAL HIGH (ref 0–44)
AST: 70 U/L — ABNORMAL HIGH (ref 15–41)
Albumin: 3.8 g/dL (ref 3.5–5.0)
Alkaline Phosphatase: 117 U/L (ref 38–126)
Anion gap: 12 (ref 5–15)
BUN: 14 mg/dL (ref 8–23)
CO2: 27 mmol/L (ref 22–32)
Calcium: 9.4 mg/dL (ref 8.9–10.3)
Chloride: 97 mmol/L — ABNORMAL LOW (ref 98–111)
Creatinine, Ser: 0.65 mg/dL (ref 0.44–1.00)
GFR calc Af Amer: >60 mL/min (ref >60)
GFR calc non Af Amer: >60 mL/min (ref >60)
Glucose, Bld: 150 mg/dL — ABNORMAL HIGH (ref 70–99)
Potassium: 3.8 mmol/L (ref 3.5–5.1)
Sodium: 136 mmol/L (ref 135–145)
Total Bilirubin: 0.9 mg/dL (ref 0.3–1.2)
Total Protein: 7 g/dL (ref 6.5–8.1)

## 2018-06-28 MED ORDER — VALACYCLOVIR HCL 1 G PO TABS: 1000.0000 mg | ORAL_TABLET | Freq: Two times a day (BID) | ORAL | 0 refills | Status: DC

## 2018-06-28 MED ORDER — FLUCONAZOLE 200 MG PO TABS: 200.0000 mg | ORAL_TABLET | Freq: Every day | ORAL | 0 refills | Status: DC

## 2018-06-28 NOTE — Progress Notes (Signed)
Hematology/Oncology progress note [facility] Telephone:([phone] [phone]   Patient Care Team: Idelle Crouch, MD as PCP - General (Internal Medicine) Bary Castilla Forest Gleason, MD as Consulting Physician (General Surgery)  REFERRING PROVIDER: Idelle Crouch, MD REASON FOR VISIT:  Follow up for adjuvant chemotherapy for breast cancer.   HISTORY OF PRESENTING ILLNESS:  Megan Harris is a  68 y.o.  female with PMH listed below who was referred to me for evaluation of breast cancer  Patient had screening mammogram on 03/27/2018 which showed left breast mass. She had diagnostic and ultrasound on 04/07/2018 which showed 0.9cm x 0.9cm x 1.4cm irregular border mixed echotexture mass at the left breast 11 o'clock 7 cm from nipple correlating to the mammographic finding. Ultrasound of the left axilla is negative She underwent biopsy of left breast mass.   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 Estrogen receptor negative, progesterone receptor negative, HER-2 negative.  Nipple discharge: Denies Family history: Paternal grandmother had breast cancer, mother deceased from brain cancer, sister deceased from bone cancer, maternal uncle had colon cancer. OCP use: Short-term use remotely Estrogen and progesterone therapy: denies History of radiation to chest: denies.  Previous breast surgery: Biopsy of left breast in early 90s.  Negative per patient.  History of complete hysterectomy due to endometriosis.  05/09/2018 Baseline MUGA showed no focal wall motion abnormality of the left ventricle.  Calculated LVEF 61%  INTERVAL HISTORY Megan Harris is a 68 y.o. 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   # left lower extremity rash  has been better, skin is sensitive to touch.  # Fatigue: reports worsening fatigue. Chronic onset, perisistent, no aggravating or improving factors, no associated symptoms.  No fever, chills, nausea, vomiting. Diarrhea.  # mucositis, using nystatin oral rinse and peridex rinse.   Nausea is better controlled with phenergan. Reports nystatin oral rinse make her nausea.    Review of Systems  Constitutional: Positive for fatigue and unexpected weight change. Negative for appetite change, chills and fever.  HENT:   Negative for hearing loss and voice change.   Eyes: Negative for eye problems.  Respiratory: Negative for chest tightness and cough.   Cardiovascular: Negative for chest pain.  Gastrointestinal: Negative for abdominal distention, abdominal pain and blood in stool.  Endocrine: Negative for hot flashes.  Genitourinary: Negative for difficulty urinating and frequency.   Musculoskeletal: Negative for arthralgias.  Skin: Positive for rash. Negative for itching.  Neurological: Negative for extremity weakness.  Hematological: Negative for adenopathy.  Psychiatric/Behavioral: Negative for confusion. The patient is not nervous/anxious.     MEDICAL HISTORY:  Past Medical History:  Diagnosis Date  . Abdominal pain, left lower quadrant 2012  . Anemia    vitamin d deficiency  . Arthritis   . Asthma   . Back pain 2005   lower back arthritis  . Cancer ([place]) 04/26/2018   5m, T1c, N0 triple negative  . Chronic cholecystitis 2012   has had choleycystectomy.  not an issue  . Constipation 2005  . Endocrine problem 2005  . Family history of bone cancer   . Family history of brain cancer   . Family history of colon cancer   . Family history of lung cancer   . GERD (gastroesophageal reflux disease) 2012  .  Hyperlipidemia 2012  . Hypertension 2002  . Nausea with vomiting 2012  . Obesity, unspecified 2012  . Pneumonia   . PONV (postoperative nausea and vomiting)   . Special  screening for malignant neoplasms, colon 2012    SURGICAL HISTORY: Past Surgical History:  Procedure Laterality Date  . BILATERAL CARPAL TUNNEL RELEASE Bilateral   . BREAST BIOPSY Left 04/13/2018   Pending Path  . BREAST EXCISIONAL BIOPSY Left early 90s   neg  . BREAST LUMPECTOMY Left 04/26/2018   Procedure: BREAST LUMPECTOMY WITH EXCISION OF SENTINEL NODE;  Surgeon: Robert Bellow, MD;  Location: [facility] ORS;  Service: General;  Laterality: Left;  . BUNIONECTOMY Bilateral 2003  . CHOLECYSTECTOMY  06/04/2010  . COLON RESECTION  2004   due diverticulitis   . COLONOSCOPY  2005   [place], Dr. Bary Castilla  . COLONOSCOPY  06/28/2012  . COLOSTOMY  2004  . COLOSTOMY REVERSAL  2004  . FOOT SURGERY Bilateral 2012   plantar faciatis  . HAND SURGERY Bilateral    carpal tunnel  . HERNIA REPAIR  2005   at colostomy site after reversal done  . JOINT REPLACEMENT    . KNEE ARTHROPLASTY Right 04/20/2017   Procedure: COMPUTER ASSISTED TOTAL KNEE ARTHROPLASTY;  Surgeon: Dereck Leep, MD;  Location: [facility] ORS;  Service: Orthopedics;  Laterality: Right;  . KNEE ARTHROSCOPY Right 02/03/2015   Procedure: ARTHROSCOPY right knee, partial medial menisectomy, condyle malleolus, patella and femoral;  Surgeon: Dereck Leep, MD;  Location: [facility] ORS;  Service: Orthopedics;  Laterality: Right;  . NASAL SINUS SURGERY     2005  . PORTACATH PLACEMENT Right 04/26/2018   Procedure: INSERTION PORT-A-CATH RIGHT;  Surgeon: Robert Bellow, MD;  Location: [facility] ORS;  Service: General;  Laterality: Right;  . SALPINGOOPHORECTOMY  1998  . TONSILLECTOMY    . TUBAL LIGATION  1978  . UPPER GI ENDOSCOPY  06/28/2012  . VAGINAL HYSTERECTOMY  1987    SOCIAL HISTORY: Social History   Socioeconomic History  . Marital status: Married    Spouse name: Francee Piccolo  . Number of children: 2  . Years of education: Not on file  . Highest education level: Not on file  Occupational History  . 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 Financial resource strain: Not on file  . Food insecurity:    Worry: Not on file    Inability: Not on file  . Transportation needs:    Medical: Not on file    Non-medical: Not on file  Tobacco Use  . Smoking status: Never Smoker  . Smokeless tobacco: Never Used  Substance and Sexual Activity  . Alcohol use: Yes    Comment: socially  . Drug use: No  . Sexual activity: Yes    Birth control/protection: Surgical  Lifestyle  . Physical activity:    Days per week: Not on file    Minutes per session: Not on file  . Stress: Not on file  Relationships  . Social connections:    Talks on phone: Not on file    Gets together: Not on file    Attends religious service: Not on file    Active member of club or organization: Not on file    Attends meetings of clubs or organizations: Not on file    Relationship status: Not on file  . Intimate partner violence:    Fear of current or ex partner: Not on file  Emotionally abused: Not on file    Physically abused: Not on file    Forced sexual activity: Not on file  Other Topics Concern  . Not on file  Social History Narrative  . Not on file   Lives at home with husband.  FAMILY HISTORY: Family History  Problem Relation Age of Onset  . Heart disease Father   . Diabetes Paternal Grandmother   . Diabetes Paternal Grandfather   . Brain cancer Mother 16  . Bone cancer Sister 49  . Colon cancer Maternal Uncle        dx 50s-60s  . Throat cancer Cousin   . Leukemia Cousin   . Lung cancer Cousin   . Ovarian cancer Neg Hx     ALLERGIES:  is allergic to codeine and penicillins.  MEDICATIONS:  Current Outpatient Medications  Medication Sig Dispense Refill  . acetaminophen (TYLENOL) 500 MG tablet Take 500 mg by mouth every 6 (six) hours as needed.    Marland Kitchen albuterol (PROVENTIL HFA;VENTOLIN HFA) 108 (90 Base) MCG/ACT inhaler Inhale 2 puffs into the lungs every 6 (six) hours as needed for wheezing or  shortness of breath.     Marland Kitchen amLODipine (NORVASC) 5 MG tablet Take 5 mg by mouth daily.    Marland Kitchen atorvastatin (LIPITOR) 10 MG tablet Take 10 mg by mouth daily.    . chlorhexidine (PERIDEX) 0.12 % solution Use as directed 15 mLs in the mouth or throat 2 (two) times daily. 473 mL 1  . Cholecalciferol (VITAMIN D3 MAXIMUM STRENGTH) 5000 units capsule Take 5,000 Units by mouth daily.    Marland Kitchen dextromethorphan-guaiFENesin (MUCINEX DM) 30-600 MG 12hr tablet Take 1 tablet by mouth daily as needed for cough.    . gabapentin (NEURONTIN) 300 MG capsule Take 300 mg by mouth 3 (three) times daily.    . hydrochlorothiazide (HYDRODIURIL) 25 MG tablet Take 25 mg by mouth daily.    . lansoprazole (PREVACID) 30 MG capsule Take 30 mg by mouth 2 (two) times daily.     Marland Kitchen levothyroxine (SYNTHROID, LEVOTHROID) 50 MCG tablet Take 50 mcg by mouth daily before breakfast.     . lidocaine-prilocaine (EMLA) cream Apply to affected area once 30 g 3  . losartan (COZAAR) 100 MG tablet Take 100 mg by mouth daily.    Marland Kitchen nystatin (MYCOSTATIN) 100000 UNIT/ML suspension Take 5 mLs (500,000 Units total) by mouth 4 (four) times daily. 473 mL 0  . ondansetron (ZOFRAN) 8 MG tablet Take 1 tablet (8 mg total) by mouth 2 (two) times daily as needed. Start on the third day after chemotherapy. 60 tablet 1  . potassium chloride (K-DUR) 10 MEQ tablet Take 1 tablet (10 mEq total) by mouth daily. 14 tablet 0  . promethazine (PHENERGAN) 25 MG tablet Take 1 tablet (25 mg total) by mouth every 6 (six) hours as needed for nausea or vomiting. 30 tablet 1  . valACYclovir (VALTREX) 1000 MG tablet Take 1 tablet (1,000 mg total) by mouth 2 (two) times daily. 10 tablet 0  . zolpidem (AMBIEN) 10 MG tablet Take 10 mg by mouth at bedtime.     . fluconazole (DIFLUCAN) 200 MG tablet Take 1 tablet (200 mg total) by mouth daily. 7 tablet 0   No current facility-administered medications for this visit.      PHYSICAL EXAMINATION: ECOG PERFORMANCE STATUS: 0 -  Asymptomatic Vitals:   06/28/18 1200  BP: (!) 135/93  Pulse: 100  Resp: 18  Temp: 98.1 F (36.7 C)   Filed  Weights   06/28/18 1200  Weight: 239 lb 11.2 oz (108.7 kg)    Physical Exam Constitutional:      General: She is not in acute distress.    Appearance: She is obese.  HENT:     Head: Normocephalic and atraumatic.  Eyes:     General: No scleral icterus.    Pupils: Pupils are equal, round, and reactive to light.  Neck:     Musculoskeletal: Normal range of motion and neck supple.  Cardiovascular:     Rate and Rhythm: Normal rate and regular rhythm.     Heart sounds: Normal heart sounds.  Pulmonary:     Effort: Pulmonary effort is normal. No respiratory distress.     Breath sounds: No wheezing.  Abdominal:     General: Bowel sounds are normal. There is no distension.     Palpations: Abdomen is soft. There is no mass.     Tenderness: There is no abdominal tenderness.  Musculoskeletal: Normal range of motion.        General: No deformity.  Skin:    General: Skin is warm and dry.     Findings: No erythema or rash.     Comments: Quarter size patch of raised erythematous rash on sacral area has crusted.  Erythematous rash on inner side of thigh and knee   Neurological:     Mental Status: She is alert and oriented to person, place, and time.     Cranial Nerves: No cranial nerve deficit.     Coordination: Coordination normal.  Psychiatric:        Behavior: Behavior normal.        Thought Content: Thought content normal.       LABORATORY DATA:  I have reviewed the data as listed Lab Results  Component Value Date   WBC 1.0 (LL) 06/28/2018   HGB 11.5 (L) 06/28/2018   HCT 34.3 (L) 06/28/2018   MCV 85.5 06/28/2018   PLT 123 (L) 06/28/2018   Recent Labs    06/14/18 1430 06/21/18 0859 06/28/18 1117  NA 136 138 136  K 4.1 3.3* 3.8  CL 98 100 97*  CO2 28 27 27   GLUCOSE 176* 167* 150*  BUN 18 11 14   CREATININE 1.17* 0.89 0.65  CALCIUM 8.8* 8.7* 9.4   GFRNONAA 48* >60 >60  GFRAA 55* >60 >60  PROT 6.5 6.6 7.0  ALBUMIN 3.9 3.7 3.8  AST 62* 35 70*  ALT 69* 45* 85*  ALKPHOS 97 81 117  BILITOT 0.5 0.5 0.9   Iron/TIBC/Ferritin/ %Sat No results found for: IRON, TIBC, FERRITIN, IRONPCTSAT   Pathology 04/26/2018 CASE: [id number]  PATIENT: Clovis Cao  Surgical Pathology Report DIAGNOSIS:  A. BREAST, LEFT UPPER INNER QUADRANT; WIDE EXCISION:  - INVASIVE MAMMARY CARCINOMA.  - SEE CANCER SUMMARY BELOW.  - BIOPSY SITE CHANGE WITH HEMATOMA AND RIBBON CLIP.  - UNREMARKABLE SKIN.  B. SENTINEL LYMPH NODE #1; EXCISION:  - ONE LYMPH NODE NEGATIVE FOR MALIGNANCY (0/1).  C. NEW BREAST MARGIN, SUPERIOR; EXCISION:  - BENIGN BREAST TISSUE WITH BIOPSY CHANGE AND HEMATOMA.  - NEGATIVE FOR MALIGNANCY.  CANCER CASE SUMMARY: INVASIVE CARCINOMA OF THE BREAST  Procedure: Wide excision  Specimen Laterality: Left  Tumor Size: 16 mm  Histologic Type: Invasive carcinoma of no special type (ductal)  Histologic Grade (Not[place] Histologic Score)            Glandular (Acinar)/Tubular Differentiation: 3            Nuclear Pleomorphism: 2  Mitotic Rate: 3            Overall Grade: 3  Ductal Carcinoma In Situ (DCIS): Present, nuclear grade 2-3, without comedonecrosis  Margins:    Invasive Carcinoma Margins: Uninvolved by invasive carcinoma            Distance from closest margin: 1.7 mm            Specify closest margin: Anterior       DCIS Margins: Uninvolved by DCIS            Distance from closest margin: 1.2 mm            Specify closest margin: Anterior  Regional Lymph Nodes: Uninvolved by tumor cells    Total Number of Lymph Nodes Examined: 1       Number of Sentinel Nodes Examined: 1  Treatment Effect in the Breast: No known presurgical therapy  Lymphovascular Invasion: Not identified  Pathologic Stage Classification (pTNM,  AJCC 8th Edition): pT1c pN0 (sn)   Breast Biomarker Testing Performed on Previous Biopsy: [id number]  Estrogen Receptor (ER) Status: NEGATIVE  Progesterone Receptor (PgR) Status: NEGATIVE  HER2 (by immunohistochemistry): NEGATIVE     ASSESSMENT & PLAN:  1. 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 ([place])   2. Chemotherapy-induced neutropenia ([facility])   3. Thrush   4. Viral rash   Cancer Staging 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 [facility]) Staging form: Breast, AJCC 8th Edition - Clinical stage from 04/28/2018: Stage IB (cT1c, cN0, cM0, G3, ER-, PR-, HER2-) - Signed by Earlie Server, MD on 04/29/2018 - Pathologic stage from 05/25/2018: Stage IB (pT1c, pN0(sn), cM0, G3, ER-, PR-, HER2-) - Signed by Earlie Server, MD on 05/25/2018  # Stage IB left triple negative breast cancer S/p left lumpectomy and sentinel lymph node biopsy.  Currently on adjuvant chemotherapy.  # Chemotherapy induced neutropenia, ANC 0.6. continue neutropenia precaution.  # Thrush/mucositis, not improved with nystatin oral rinse.  Recommend to start diflucan 262m x 7 days.   # Viral rash.shingles, symptomatically better. Patient is neutropenic, will extend coverage for another 5 days.  # Fatigue due to chemotherapy.  #Chemotherapy-induced nausea, better controlled, continue Phenergan as instructed.  #Hypokalemia, K 3.8 persistent,continue  potassium chloride 10 mEq daily.  14-day supply prescribed to pharmacy.  All questions were answered. The patient knows to call the clinic with any problems questions or concerns.  ZEarlie Server MD, PhD Hematology Oncology CColima End[facility] Center Incat AHinsdale Surgical CenterPager- [phone]/10/2018

## 2018-06-28 NOTE — Progress Notes (Signed)
Patient here for follow up. Pt feeling very weak today. Rash to left leg getting better

## 2018-06-29 DIAGNOSIS — Z96651 Presence of right artificial knee joint: Secondary | ICD-10-CM | POA: Diagnosis not present

## 2018-06-29 DIAGNOSIS — M5442 Lumbago with sciatica, left side: Secondary | ICD-10-CM | POA: Diagnosis not present

## 2018-06-29 DIAGNOSIS — M1711 Unilateral primary osteoarthritis, right knee: Secondary | ICD-10-CM | POA: Diagnosis not present

## 2018-07-02 DIAGNOSIS — M5090 Cervical disc disorder, unspecified, unspecified cervical region: Secondary | ICD-10-CM | POA: Diagnosis not present

## 2018-07-02 DIAGNOSIS — E785 Hyperlipidemia, unspecified: Secondary | ICD-10-CM | POA: Diagnosis not present

## 2018-07-02 DIAGNOSIS — M5116 Intervertebral disc disorders with radiculopathy, lumbar region: Secondary | ICD-10-CM | POA: Diagnosis not present

## 2018-07-02 DIAGNOSIS — B09 Unspecified viral infection characterized by skin and mucous membrane lesions: Secondary | ICD-10-CM | POA: Diagnosis not present

## 2018-07-02 DIAGNOSIS — J449 Chronic obstructive pulmonary disease, unspecified: Secondary | ICD-10-CM | POA: Diagnosis not present

## 2018-07-02 DIAGNOSIS — Z96651 Presence of right artificial knee joint: Secondary | ICD-10-CM | POA: Diagnosis not present

## 2018-07-02 DIAGNOSIS — E119 Type 2 diabetes mellitus without complications: Secondary | ICD-10-CM | POA: Diagnosis not present

## 2018-07-02 DIAGNOSIS — G4700 Insomnia, unspecified: Secondary | ICD-10-CM | POA: Diagnosis not present

## 2018-07-02 DIAGNOSIS — I10 Essential (primary) hypertension: Secondary | ICD-10-CM | POA: Diagnosis not present

## 2018-07-02 DIAGNOSIS — C50912 Malignant neoplasm of unspecified site of left female breast: Secondary | ICD-10-CM | POA: Diagnosis not present

## 2018-07-02 DIAGNOSIS — M1990 Unspecified osteoarthritis, unspecified site: Secondary | ICD-10-CM | POA: Diagnosis not present

## 2018-07-02 DIAGNOSIS — E559 Vitamin D deficiency, unspecified: Secondary | ICD-10-CM | POA: Diagnosis not present

## 2018-07-03 ENCOUNTER — Telehealth: Payer: Self-pay | Admitting: *Deleted

## 2018-07-03 DIAGNOSIS — B029 Zoster without complications: Secondary | ICD-10-CM

## 2018-07-03 MED ORDER — LIDOCAINE 5 % EX OINT: 1.0000 "application " | TOPICAL_OINTMENT | CUTANEOUS | 0 refills | Status: DC | PRN

## 2018-07-03 NOTE — Telephone Encounter (Signed)
Patient called reporting that she broke out in shingles last week and she still has itchy bumps on her legs. She is asking what topical cream she can use on them. Please advise

## 2018-07-03 NOTE — Telephone Encounter (Signed)
Hassan Rowan, she may try over the counter benadryl cream if itchy. If burning sensation please give her lidocaine cream Rx. Thanks.

## 2018-07-03 NOTE — Telephone Encounter (Signed)
Patient is going to try the benadryl sirdt and asked that Lidocaine be sent in for her in case that does not control it. Lidocaine e scribed

## 2018-07-04 ENCOUNTER — Telehealth: Payer: Self-pay

## 2018-07-04 NOTE — Telephone Encounter (Signed)
Prior auth for Lidocaine 5% ointment has been started via [email or web address]. Response from [employer] pending.   Case ID: [id number] Rx: [id number]

## 2018-07-05 ENCOUNTER — Encounter: Payer: Self-pay | Admitting: Oncology

## 2018-07-05 ENCOUNTER — Other Ambulatory Visit: Payer: Self-pay

## 2018-07-05 ENCOUNTER — Inpatient Hospital Stay: Payer: PPO

## 2018-07-05 ENCOUNTER — Inpatient Hospital Stay (HOSPITAL_BASED_OUTPATIENT_CLINIC_OR_DEPARTMENT_OTHER): Payer: PPO | Admitting: Oncology

## 2018-07-05 VITALS — BP 124/88 | HR 84 | Temp 98.4°F | Resp 18 | Ht 65.0 in | Wt 239.2 lb

## 2018-07-05 DIAGNOSIS — D701 Agranulocytosis secondary to cancer chemotherapy: Secondary | ICD-10-CM | POA: Diagnosis not present

## 2018-07-05 DIAGNOSIS — E876 Hypokalemia: Secondary | ICD-10-CM | POA: Diagnosis not present

## 2018-07-05 DIAGNOSIS — T451X5A Adverse effect of antineoplastic and immunosuppressive drugs, initial encounter: Secondary | ICD-10-CM

## 2018-07-05 DIAGNOSIS — C50912 Malignant neoplasm of unspecified site of left female breast: Secondary | ICD-10-CM | POA: Diagnosis not present

## 2018-07-05 DIAGNOSIS — C50919 Malignant neoplasm of unspecified site of unspecified female breast: Secondary | ICD-10-CM

## 2018-07-05 DIAGNOSIS — Z5111 Encounter for antineoplastic chemotherapy: Secondary | ICD-10-CM | POA: Diagnosis not present

## 2018-07-05 DIAGNOSIS — Z171 Estrogen receptor negative status [ER-]: Secondary | ICD-10-CM

## 2018-07-05 DIAGNOSIS — K1230 Oral mucositis (ulcerative), unspecified: Secondary | ICD-10-CM | POA: Diagnosis not present

## 2018-07-05 DIAGNOSIS — E860 Dehydration: Secondary | ICD-10-CM

## 2018-07-05 LAB — COMPREHENSIVE METABOLIC PANEL
ALT: 47 U/L — ABNORMAL HIGH (ref 0–44)
AST: 36 U/L (ref 15–41)
Albumin: 3.8 g/dL (ref 3.5–5.0)
Alkaline Phosphatase: 89 U/L (ref 38–126)
Anion gap: 13 (ref 5–15)
BUN: 14 mg/dL (ref 8–23)
CO2: 27 mmol/L (ref 22–32)
Calcium: 9.2 mg/dL (ref 8.9–10.3)
Chloride: 97 mmol/L — ABNORMAL LOW (ref 98–111)
Creatinine, Ser: 0.85 mg/dL (ref 0.44–1.00)
GFR calc Af Amer: >60 mL/min (ref >60)
GFR calc non Af Amer: >60 mL/min (ref >60)
Glucose, Bld: 178 mg/dL — ABNORMAL HIGH (ref 70–99)
Potassium: 2.8 mmol/L — ABNORMAL LOW (ref 3.5–5.1)
Sodium: 137 mmol/L (ref 135–145)
Total Bilirubin: 0.5 mg/dL (ref 0.3–1.2)
Total Protein: 6.7 g/dL (ref 6.5–8.1)

## 2018-07-05 LAB — CBC WITH DIFFERENTIAL/PLATELET
Abs Immature Granulocytes: 1.42 10*3/uL — ABNORMAL HIGH (ref 0.00–0.07)
Basophils Absolute: 0.1 10*3/uL (ref 0.0–0.1)
Basophils Relative: 1 %
Eosinophils Absolute: 0.1 10*3/uL (ref 0.0–0.5)
Eosinophils Relative: 1 %
HCT: 34.8 % — ABNORMAL LOW (ref 36.0–46.0)
Hemoglobin: 11.7 g/dL — ABNORMAL LOW (ref 12.0–15.0)
Immature Granulocytes: 14 %
Lymphocytes Relative: 10 %
Lymphs Abs: 1 10*3/uL (ref 0.7–4.0)
MCH: 29.2 pg (ref 26.0–34.0)
MCHC: 33.6 g/dL (ref 30.0–36.0)
MCV: 86.8 fL (ref 80.0–100.0)
Monocytes Absolute: 0.9 10*3/uL (ref 0.1–1.0)
Monocytes Relative: 9 %
Neutro Abs: 7 10*3/uL (ref 1.7–7.7)
Neutrophils Relative %: 65 %
Platelets: 176 10*3/uL (ref 150–400)
RBC: 4.01 MIL/uL (ref 3.87–5.11)
RDW: 15.7 % — ABNORMAL HIGH (ref 11.5–15.5)
Smear Review: ADEQUATE
WBC: 10.6 10*3/uL — ABNORMAL HIGH (ref 4.0–10.5)
nRBC: 0.3 % — ABNORMAL HIGH (ref 0.0–0.2)

## 2018-07-05 LAB — MAGNESIUM: Magnesium: 1.6 mg/dL — ABNORMAL LOW (ref 1.7–2.4)

## 2018-07-05 MED ORDER — MAGNESIUM SULFATE 2 GM/50ML IV SOLN
2.0000 g | Freq: Once | INTRAVENOUS | Status: AC
  Administered 2018-07-05: 2 g via INTRAVENOUS
  Filled 2018-07-05: qty 50

## 2018-07-05 MED ORDER — VALACYCLOVIR HCL 1 G PO TABS: 1000.0000 mg | ORAL_TABLET | Freq: Two times a day (BID) | ORAL | 0 refills | Status: DC

## 2018-07-05 MED ORDER — SODIUM CHLORIDE 0.9% FLUSH
10.0000 mL | Freq: Once | INTRAVENOUS | Status: AC
  Administered 2018-07-05: 10 mL via INTRAVENOUS
  Filled 2018-07-05: qty 10

## 2018-07-05 MED ORDER — SODIUM CHLORIDE 0.9 % IV SOLN
Freq: Once | INTRAVENOUS | Status: AC
  Administered 2018-07-05: 13:00:00 via INTRAVENOUS
  Filled 2018-07-05: qty 5

## 2018-07-05 MED ORDER — POTASSIUM CHLORIDE CRYS ER 20 MEQ PO TBCR: 20.0000 meq | EXTENDED_RELEASE_TABLET | Freq: Every day | ORAL | 0 refills | Status: DC

## 2018-07-05 MED ORDER — SODIUM CHLORIDE 0.9 % IV SOLN
40.0000 meq | Freq: Once | INTRAVENOUS | Status: AC
  Administered 2018-07-05: 40 meq via INTRAVENOUS
  Filled 2018-07-05: qty 20

## 2018-07-05 MED ORDER — PALONOSETRON HCL INJECTION 0.25 MG/5ML
0.2500 mg | Freq: Once | INTRAVENOUS | Status: AC
  Administered 2018-07-05: 0.25 mg via INTRAVENOUS
  Filled 2018-07-05: qty 5

## 2018-07-05 MED ORDER — PEGFILGRASTIM 6 MG/0.6ML ~~LOC~~ PSKT
6.0000 mg | PREFILLED_SYRINGE | Freq: Once | SUBCUTANEOUS | Status: AC
  Administered 2018-07-05: 6 mg via SUBCUTANEOUS
  Filled 2018-07-05: qty 0.6

## 2018-07-05 MED ORDER — HEPARIN SOD (PORK) LOCK FLUSH 100 UNIT/ML IV SOLN
500.0000 [IU] | Freq: Once | INTRAVENOUS | Status: AC
  Administered 2018-07-05: 500 [IU] via INTRAVENOUS
  Filled 2018-07-05: qty 5

## 2018-07-05 MED ORDER — SODIUM CHLORIDE 0.9 % IV SOLN
Freq: Once | INTRAVENOUS | Status: AC
  Administered 2018-07-05: 10:00:00 via INTRAVENOUS
  Filled 2018-07-05: qty 250

## 2018-07-05 MED ORDER — SODIUM CHLORIDE 0.9 % IV SOLN
600.0000 mg/m2 | Freq: Once | INTRAVENOUS | Status: AC
  Administered 2018-07-05: 1360 mg via INTRAVENOUS
  Filled 2018-07-05: qty 25

## 2018-07-05 MED ORDER — DOXORUBICIN HCL CHEMO IV INJECTION 2 MG/ML
60.0000 mg/m2 | Freq: Once | INTRAVENOUS | Status: AC
  Administered 2018-07-05: 136 mg via INTRAVENOUS
  Filled 2018-07-05: qty 68

## 2018-07-05 MED ORDER — SODIUM CHLORIDE 0.9 % IV SOLN: 2.0000 g | Freq: Once | INTRAVENOUS | Status: DC

## 2018-07-05 NOTE — Progress Notes (Signed)
Hematology/Oncology progress note [facility] Telephone:([phone] [phone]   Patient Care Team: Idelle Crouch, MD as PCP - General (Internal Medicine) Bary Castilla Forest Gleason, MD as Consulting Physician (General Surgery)  REFERRING PROVIDER: Idelle Crouch, MD REASON FOR VISIT:  Follow up for adjuvant chemotherapy for breast cancer.   HISTORY OF PRESENTING ILLNESS:  Megan Harris is a  68 y.o.  female with PMH listed below who was referred to me for evaluation of breast cancer  Patient had screening mammogram on 03/27/2018 which showed left breast mass. She had diagnostic and ultrasound on 04/07/2018 which showed 0.9cm x 0.9cm x 1.4cm irregular border mixed echotexture mass at the left breast 11 o'clock 7 cm from nipple correlating to the mammographic finding. Ultrasound of the left axilla is negative She underwent biopsy of left breast mass.   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 Estrogen receptor negative, progesterone receptor negative, HER-2 negative.  Nipple discharge: Denies Family history: Paternal grandmother had breast cancer, mother deceased from brain cancer, sister deceased from bone cancer, maternal uncle had colon cancer. OCP use: Short-term use remotely Estrogen and progesterone therapy: denies History of radiation to chest: denies.  Previous breast surgery: Biopsy of left breast in early 90s.  Negative per patient.  History of complete hysterectomy due to endometriosis.  05/09/2018 Baseline MUGA showed no focal wall motion abnormality of the left ventricle.  Calculated LVEF 61%  INTERVAL HISTORY Megan Harris is a 68 y.o. 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 #Fatigue is better than last week.  #Left lower extremity shingle rash has crusted.  Skin is sensitive to touch.  Itchiness she uses Benadryl #Mucositis/thrush, she finished a course of Diflucan.  Thrush cleared up. #Sciatic pain, is better.  Patient is seeing physical therapy #Nausea is well controlled with Phenergan.     Review of Systems  Constitutional: Positive for fatigue and unexpected weight change. Negative for appetite change, chills and fever.  HENT:   Negative for hearing loss and voice change.   Eyes: Negative for eye problems.  Respiratory: Negative for chest tightness and cough.   Cardiovascular: Negative for chest pain.  Gastrointestinal: Negative for abdominal distention, abdominal pain and blood in stool.  Endocrine: Negative for hot flashes.  Genitourinary: Negative for difficulty urinating and frequency.   Musculoskeletal: Negative for arthralgias.  Skin: Positive for rash. Negative for itching.  Neurological: Negative for extremity weakness.  Hematological: Negative for adenopathy.  Psychiatric/Behavioral: Negative for confusion. The patient is not nervous/anxious.     MEDICAL HISTORY:  Past Medical History:  Diagnosis Date  . Abdominal pain, left lower quadrant 2012  . Anemia    vitamin d deficiency  . Arthritis   . Asthma   . Back pain 2005   lower back arthritis  . Cancer ([place]) 04/26/2018   35m, T1c, N0 triple negative  . Chronic cholecystitis 2012   has had choleycystectomy.  not an issue  . Constipation 2005  . Endocrine problem 2005  . Family history of bone cancer   . Family history of brain cancer   . Family history of colon cancer   . Family history of lung cancer   . GERD (gastroesophageal reflux disease) 2012  . Hyperlipidemia 2012  . Hypertension 2002  . Nausea with vomiting 2012  .  Obesity, unspecified 2012  . Pneumonia   . PONV (postoperative nausea and vomiting)   . Special screening for malignant neoplasms, colon 2012    SURGICAL HISTORY: Past Surgical  History:  Procedure Laterality Date  . BILATERAL CARPAL TUNNEL RELEASE Bilateral   . BREAST BIOPSY Left 04/13/2018   Pending Path  . BREAST EXCISIONAL BIOPSY Left early 90s   neg  . BREAST LUMPECTOMY Left 04/26/2018   Procedure: BREAST LUMPECTOMY WITH EXCISION OF SENTINEL NODE;  Surgeon: Robert Bellow, MD;  Location: [facility] ORS;  Service: General;  Laterality: Left;  . BUNIONECTOMY Bilateral 2003  . CHOLECYSTECTOMY  06/04/2010  . COLON RESECTION  2004   due diverticulitis   . COLONOSCOPY  2005   [place], Dr. Bary Castilla  . COLONOSCOPY  06/28/2012  . COLOSTOMY  2004  . COLOSTOMY REVERSAL  2004  . FOOT SURGERY Bilateral 2012   plantar faciatis  . HAND SURGERY Bilateral    carpal tunnel  . HERNIA REPAIR  2005   at colostomy site after reversal done  . JOINT REPLACEMENT    . KNEE ARTHROPLASTY Right 04/20/2017   Procedure: COMPUTER ASSISTED TOTAL KNEE ARTHROPLASTY;  Surgeon: Dereck Leep, MD;  Location: [facility] ORS;  Service: Orthopedics;  Laterality: Right;  . KNEE ARTHROSCOPY Right 02/03/2015   Procedure: ARTHROSCOPY right knee, partial medial menisectomy, condyle malleolus, patella and femoral;  Surgeon: Dereck Leep, MD;  Location: [facility] ORS;  Service: Orthopedics;  Laterality: Right;  . NASAL SINUS SURGERY     2005  . PORTACATH PLACEMENT Right 04/26/2018   Procedure: INSERTION PORT-A-CATH RIGHT;  Surgeon: Robert Bellow, MD;  Location: [facility] ORS;  Service: General;  Laterality: Right;  . SALPINGOOPHORECTOMY  1998  . TONSILLECTOMY    . TUBAL LIGATION  1978  . UPPER GI ENDOSCOPY  06/28/2012  . VAGINAL HYSTERECTOMY  1987    SOCIAL HISTORY: Social History   Socioeconomic History  . Marital status: Married    Spouse name: Francee Piccolo  . Number of children: 2  . Years of education: Not on file  . Highest education level: Not on file  Occupational History  . 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 Financial resource strain: Not on file  . Food  insecurity    Worry: Not on file    Inability: Not on file  . Transportation needs    Medical: Not on file    Non-medical: Not on file  Tobacco Use  . Smoking status: Never Smoker  . Smokeless tobacco: Never Used  Substance and Sexual Activity  . Alcohol use: Not Currently  . Drug use: No  . Sexual activity: Yes    Birth control/protection: Surgical  Lifestyle  . Physical activity    Days per week: Not on file    Minutes per session: Not on file  . Stress: Not on file  Relationships  . Social Herbalist on phone: Not on file    Gets together: Not on file    Attends religious service: Not on file    Active member of club or organization: Not on file    Attends meetings of clubs or organizations: Not on file    Relationship status: Not on file  . Intimate partner violence    Fear of current or ex partner: Not on file    Emotionally abused: Not on file    Physically abused: Not on file    Forced  sexual activity: Not on file  Other Topics Concern  . Not on file  Social History Narrative  . Not on file   Lives at home with husband.  FAMILY HISTORY: Family History  Problem Relation Age of Onset  . Heart disease Father   . Diabetes Paternal Grandmother   . Diabetes Paternal Grandfather   . Brain cancer Mother 72  . Bone cancer Sister 36  . Colon cancer Maternal Uncle        dx 50s-60s  . Throat cancer Cousin   . Leukemia Cousin   . Lung cancer Cousin   . Ovarian cancer Neg Hx     ALLERGIES:  is allergic to codeine and penicillins.  MEDICATIONS:  Current Outpatient Medications  Medication Sig Dispense Refill  . acetaminophen (TYLENOL) 500 MG tablet Take 500 mg by mouth every 6 (six) hours as needed.    Marland Kitchen albuterol (PROVENTIL HFA;VENTOLIN HFA) 108 (90 Base) MCG/ACT inhaler Inhale 2 puffs into the lungs every 6 (six) hours as needed for wheezing or shortness of breath.     Marland Kitchen amLODipine (NORVASC) 5 MG tablet Take 5 mg by mouth daily.    . chlorhexidine  (PERIDEX) 0.12 % solution Use as directed 15 mLs in the mouth or throat 2 (two) times daily. 473 mL 1  . Cholecalciferol (VITAMIN D3 MAXIMUM STRENGTH) 5000 units capsule Take 5,000 Units by mouth daily.    Marland Kitchen dextromethorphan-guaiFENesin (MUCINEX DM) 30-600 MG 12hr tablet Take 1 tablet by mouth daily as needed for cough.    . gabapentin (NEURONTIN) 300 MG capsule Take 300 mg by mouth 4 (four) times daily.     . hydrochlorothiazide (HYDRODIURIL) 25 MG tablet Take 25 mg by mouth daily.    . lansoprazole (PREVACID) 30 MG capsule Take 30 mg by mouth 2 (two) times daily.     Marland Kitchen levothyroxine (SYNTHROID, LEVOTHROID) 50 MCG tablet Take 50 mcg by mouth daily before breakfast.     . lidocaine (XYLOCAINE) 5 % ointment Apply 1 application topically as needed. 35.44 g 0  . lidocaine-prilocaine (EMLA) cream Apply to affected area once 30 g 3  . losartan (COZAAR) 100 MG tablet Take 100 mg by mouth daily.    Marland Kitchen nystatin (MYCOSTATIN) 100000 UNIT/ML suspension Take 5 mLs (500,000 Units total) by mouth 4 (four) times daily. 473 mL 0  . promethazine (PHENERGAN) 25 MG tablet Take 1 tablet (25 mg total) by mouth every 6 (six) hours as needed for nausea or vomiting. 30 tablet 1  . zolpidem (AMBIEN) 10 MG tablet Take 10 mg by mouth at bedtime.     Marland Kitchen atorvastatin (LIPITOR) 10 MG tablet Take 10 mg by mouth daily.    . fluconazole (DIFLUCAN) 200 MG tablet Take 1 tablet (200 mg total) by mouth daily. 7 tablet 0  . ondansetron (ZOFRAN) 8 MG tablet Take 1 tablet (8 mg total) by mouth 2 (two) times daily as needed. Start on the third day after chemotherapy. (Patient not taking: Reported on 07/05/2018) 60 tablet 1  . potassium chloride SA (K-DUR) 20 MEQ tablet Take 1 tablet (20 mEq total) by mouth daily. 30 tablet 0  . valACYclovir (VALTREX) 1000 MG tablet Take 1 tablet (1,000 mg total) by mouth 2 (two) times daily. 60 tablet 0   No current facility-administered medications for this visit.      PHYSICAL EXAMINATION: ECOG  PERFORMANCE STATUS: 0 - Asymptomatic Vitals:   07/05/18 0856  BP: 124/88  Pulse: 84  Resp: 18  Temp: 98.4  F (36.9 C)   Filed Weights   07/05/18 0856  Weight: 239 lb 3.2 oz (108.5 kg)    Physical Exam Constitutional:      General: She is not in acute distress.    Appearance: She is obese.  HENT:     Head: Normocephalic and atraumatic.  Eyes:     General: No scleral icterus.    Pupils: Pupils are equal, round, and reactive to light.  Neck:     Musculoskeletal: Normal range of motion and neck supple.  Cardiovascular:     Rate and Rhythm: Normal rate and regular rhythm.     Heart sounds: Normal heart sounds.  Pulmonary:     Effort: Pulmonary effort is normal. No respiratory distress.     Breath sounds: No wheezing.  Abdominal:     General: Bowel sounds are normal. There is no distension.     Palpations: Abdomen is soft. There is no mass.     Tenderness: There is no abdominal tenderness.  Musculoskeletal: Normal range of motion.        General: No deformity.  Skin:    General: Skin is warm and dry.     Findings: No erythema or rash.     Comments: One Quarter size patch of raised erythematous rash on sacral area has crusted.  Scatted  erythematous rash has crusted.   Neurological:     Mental Status: She is alert and oriented to person, place, and time.     Cranial Nerves: No cranial nerve deficit.     Coordination: Coordination normal.  Psychiatric:        Behavior: Behavior normal.        Thought Content: Thought content normal.       LABORATORY DATA:  I have reviewed the data as listed Lab Results  Component Value Date   WBC 10.6 (H) 07/05/2018   HGB 11.7 (L) 07/05/2018   HCT 34.8 (L) 07/05/2018   MCV 86.8 07/05/2018   PLT 176 07/05/2018   Recent Labs    06/21/18 0859 06/28/18 1117 07/05/18 0830  NA 138 136 137  K 3.3* 3.8 2.8*  CL 100 97* 97*  CO2 27 27 27   GLUCOSE 167* 150* 178*  BUN 11 14 14   CREATININE 0.89 0.65 0.85  CALCIUM 8.7* 9.4 9.2   GFRNONAA >60 >60 >60  GFRAA >60 >60 >60  PROT 6.6 7.0 6.7  ALBUMIN 3.7 3.8 3.8  AST 35 70* 36  ALT 45* 85* 47*  ALKPHOS 81 117 89  BILITOT 0.5 0.9 0.5   Iron/TIBC/Ferritin/ %Sat No results found for: IRON, TIBC, FERRITIN, IRONPCTSAT   Pathology 04/26/2018 CASE: [id number]  PATIENT: Clovis Cao  Surgical Pathology Report DIAGNOSIS:  A. BREAST, LEFT UPPER INNER QUADRANT; WIDE EXCISION:  - INVASIVE MAMMARY CARCINOMA.  - SEE CANCER SUMMARY BELOW.  - BIOPSY SITE CHANGE WITH HEMATOMA AND RIBBON CLIP.  - UNREMARKABLE SKIN.  B. SENTINEL LYMPH NODE #1; EXCISION:  - ONE LYMPH NODE NEGATIVE FOR MALIGNANCY (0/1).  C. NEW BREAST MARGIN, SUPERIOR; EXCISION:  - BENIGN BREAST TISSUE WITH BIOPSY CHANGE AND HEMATOMA.  - NEGATIVE FOR MALIGNANCY.  CANCER CASE SUMMARY: INVASIVE CARCINOMA OF THE BREAST  Procedure: Wide excision  Specimen Laterality: Left  Tumor Size: 16 mm  Histologic Type: Invasive carcinoma of no special type (ductal)  Histologic Grade (Nottingham Histologic Score)            Glandular (Acinar)/Tubular Differentiation: 3  Nuclear Pleomorphism: 2            Mitotic Rate: 3            Overall Grade: 3  Ductal Carcinoma In Situ (DCIS): Present, nuclear grade 2-3, without comedonecrosis  Margins:    Invasive Carcinoma Margins: Uninvolved by invasive carcinoma            Distance from closest margin: 1.7 mm            Specify closest margin: Anterior       DCIS Margins: Uninvolved by DCIS            Distance from closest margin: 1.2 mm            Specify closest margin: Anterior  Regional Lymph Nodes: Uninvolved by tumor cells    Total Number of Lymph Nodes Examined: 1       Number of Sentinel Nodes Examined: 1  Treatment Effect in the Breast: No known presurgical therapy  Lymphovascular Invasion: Not identified  Pathologic Stage Classification (pTNM,  AJCC 8th Edition): pT1c pN0 (sn)   Breast Biomarker Testing Performed on Previous Biopsy: [id number]  Estrogen Receptor (ER) Status: NEGATIVE  Progesterone Receptor (PgR) Status: NEGATIVE  HER2 (by immunohistochemistry): NEGATIVE     ASSESSMENT & PLAN:  1. Hypokalemia   2. 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 ([place])   3. Chemotherapy-induced neutropenia ([facility])   4. Mucositis   5. Hypomagnesemia   6. Encounter for antineoplastic chemotherapy   Cancer Staging 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 [facility]) Staging form: Breast, AJCC 8th Edition - Clinical stage from 04/28/2018: Stage IB (cT1c, cN0, cM0, G3, ER-, PR-, HER2-) - Signed by Earlie Server, MD on 04/29/2018 - Pathologic stage from 05/25/2018: Stage IB (pT1c, pN0(sn), cM0, G3, ER-, PR-, HER2-) - Signed by Earlie Server, MD on 05/25/2018  # Stage IB left triple negative breast cancer S/p left lumpectomy and sentinel lymph node biopsy.  Currently on adjuvant chemotherapy. Labs are reviewed and discussed with patient. Counts acceptable to proceed with cycle 4 DD AC today.  #Chemotherapy-induced neutropenia, improved.  Continue neutropenia precaution. Patient will get G-CSF support today  #Thrush/mucositis, finished course of Diflucan.  Continue nystatin oral rinse. #Viral rash/shingles, symptomatically better.  Given that patient continues to be immunocompromised, will extend coverage for the next 30 days.  Refill sent to patient's pharmacy. Recommend patient to completely cover rash and to fully crusted.   #Hypokalemia, K 2.8 today.  Recommend patient to start long-term potassium replacement.  20 mEq daily. Today she will also get IV potassium chloride 40 mEq x 1.  #Hypomagnesia, magnesium 1.6.  Proceed with IV magnesium 2 g x 1 today. Repeat BMP and magnesium in 2 days.  Follow-up in 1 week with labs and a virtual visit-per patient's request. Follow-up in 2 weeks for evaluation prior to starting weekly Taxol.  All  questions were answered. The patient knows to call the clinic with any problems questions or concerns.  Earlie Server, MD, PhD 07/05/2018

## 2018-07-05 NOTE — Progress Notes (Signed)
Mouth sores are gone, still hurting from shingles on left leg and shin. Eating and drinking good. Patient has nausea and she takes phenergan for that.

## 2018-07-07 ENCOUNTER — Other Ambulatory Visit: Payer: Self-pay

## 2018-07-07 ENCOUNTER — Inpatient Hospital Stay: Payer: PPO | Attending: Nurse Practitioner

## 2018-07-07 ENCOUNTER — Inpatient Hospital Stay: Payer: PPO

## 2018-07-07 DIAGNOSIS — E876 Hypokalemia: Secondary | ICD-10-CM

## 2018-07-07 DIAGNOSIS — C50912 Malignant neoplasm of unspecified site of left female breast: Secondary | ICD-10-CM | POA: Diagnosis not present

## 2018-07-07 DIAGNOSIS — C801 Malignant (primary) neoplasm, unspecified: Secondary | ICD-10-CM

## 2018-07-07 DIAGNOSIS — C50919 Malignant neoplasm of unspecified site of unspecified female breast: Secondary | ICD-10-CM

## 2018-07-07 LAB — BASIC METABOLIC PANEL
Anion gap: 12 (ref 5–15)
BUN: 21 mg/dL (ref 8–23)
CO2: 29 mmol/L (ref 22–32)
Calcium: 9.1 mg/dL (ref 8.9–10.3)
Chloride: 99 mmol/L (ref 98–111)
Creatinine, Ser: 0.86 mg/dL (ref 0.44–1.00)
GFR calc Af Amer: >60 mL/min (ref >60)
GFR calc non Af Amer: >60 mL/min (ref >60)
Glucose, Bld: 166 mg/dL — ABNORMAL HIGH (ref 70–99)
Potassium: 4 mmol/L (ref 3.5–5.1)
Sodium: 140 mmol/L (ref 135–145)

## 2018-07-07 LAB — CBC WITH DIFFERENTIAL/PLATELET
Abs Immature Granulocytes: 2.63 10*3/uL — ABNORMAL HIGH (ref 0.00–0.07)
Basophils Absolute: 0.2 10*3/uL — ABNORMAL HIGH (ref 0.0–0.1)
Basophils Relative: 0 %
Eosinophils Absolute: 0 10*3/uL (ref 0.0–0.5)
Eosinophils Relative: 0 %
HCT: 33.9 % — ABNORMAL LOW (ref 36.0–46.0)
Hemoglobin: 11.3 g/dL — ABNORMAL LOW (ref 12.0–15.0)
Immature Granulocytes: 4 %
Lymphocytes Relative: 2 %
Lymphs Abs: 0.9 10*3/uL (ref 0.7–4.0)
MCH: 29.4 pg (ref 26.0–34.0)
MCHC: 33.3 g/dL (ref 30.0–36.0)
MCV: 88.1 fL (ref 80.0–100.0)
Monocytes Absolute: 0.7 10*3/uL (ref 0.1–1.0)
Monocytes Relative: 1 %
Neutro Abs: 58.4 10*3/uL — ABNORMAL HIGH (ref 1.7–7.7)
Neutrophils Relative %: 93 %
Platelets: 182 10*3/uL (ref 150–400)
RBC: 3.85 MIL/uL — ABNORMAL LOW (ref 3.87–5.11)
RDW: 16.7 % — ABNORMAL HIGH (ref 11.5–15.5)
Smear Review: ADEQUATE
WBC: 62.9 10*3/uL (ref 4.0–10.5)
nRBC: 0 % (ref 0.0–0.2)

## 2018-07-07 LAB — MAGNESIUM: Magnesium: 1.7 mg/dL (ref 1.7–2.4)

## 2018-07-07 MED ORDER — HEPARIN SOD (PORK) LOCK FLUSH 100 UNIT/ML IV SOLN: 500.0000 [IU] | Freq: Once | INTRAVENOUS | Status: DC

## 2018-07-07 NOTE — Progress Notes (Signed)
Dr. Tasia Catchings reviewed lab results and no treatment today.

## 2018-07-10 ENCOUNTER — Other Ambulatory Visit: Payer: Self-pay | Admitting: *Deleted

## 2018-07-10 DIAGNOSIS — M5090 Cervical disc disorder, unspecified, unspecified cervical region: Secondary | ICD-10-CM | POA: Diagnosis not present

## 2018-07-10 DIAGNOSIS — I10 Essential (primary) hypertension: Secondary | ICD-10-CM | POA: Diagnosis not present

## 2018-07-10 DIAGNOSIS — E785 Hyperlipidemia, unspecified: Secondary | ICD-10-CM | POA: Diagnosis not present

## 2018-07-10 DIAGNOSIS — E119 Type 2 diabetes mellitus without complications: Secondary | ICD-10-CM | POA: Diagnosis not present

## 2018-07-10 DIAGNOSIS — J449 Chronic obstructive pulmonary disease, unspecified: Secondary | ICD-10-CM | POA: Diagnosis not present

## 2018-07-10 DIAGNOSIS — Z96651 Presence of right artificial knee joint: Secondary | ICD-10-CM | POA: Diagnosis not present

## 2018-07-10 DIAGNOSIS — M5116 Intervertebral disc disorders with radiculopathy, lumbar region: Secondary | ICD-10-CM | POA: Diagnosis not present

## 2018-07-10 DIAGNOSIS — B09 Unspecified viral infection characterized by skin and mucous membrane lesions: Secondary | ICD-10-CM | POA: Diagnosis not present

## 2018-07-10 DIAGNOSIS — M1990 Unspecified osteoarthritis, unspecified site: Secondary | ICD-10-CM | POA: Diagnosis not present

## 2018-07-10 DIAGNOSIS — E559 Vitamin D deficiency, unspecified: Secondary | ICD-10-CM | POA: Diagnosis not present

## 2018-07-10 DIAGNOSIS — G4700 Insomnia, unspecified: Secondary | ICD-10-CM | POA: Diagnosis not present

## 2018-07-10 DIAGNOSIS — C50912 Malignant neoplasm of unspecified site of left female breast: Secondary | ICD-10-CM | POA: Diagnosis not present

## 2018-07-10 MED ORDER — NYSTATIN 100000 UNIT/ML MT SUSP: 5.0000 mL | Freq: Four times a day (QID) | OROMUCOSAL | 0 refills | Status: DC

## 2018-07-11 ENCOUNTER — Other Ambulatory Visit: Payer: Self-pay | Admitting: Oncology

## 2018-07-11 ENCOUNTER — Encounter: Payer: Self-pay | Admitting: *Deleted

## 2018-07-11 ENCOUNTER — Other Ambulatory Visit: Payer: Self-pay

## 2018-07-11 MED ORDER — FLUCONAZOLE 200 MG PO TABS: 200.0000 mg | ORAL_TABLET | Freq: Every day | ORAL | 0 refills | Status: DC

## 2018-07-11 NOTE — Progress Notes (Signed)
Patient called late yesterday afternoon with complaints of her swish and swallow not working and wanting to try the "pill" she used before.  Notified Dr. Tasia Catchings this morning.  Prescription has been called in for the patient.  Patient notified to check her pharmacy.  She was grateful.

## 2018-07-12 ENCOUNTER — Other Ambulatory Visit: Payer: Self-pay

## 2018-07-12 ENCOUNTER — Inpatient Hospital Stay: Payer: PPO

## 2018-07-12 ENCOUNTER — Encounter: Payer: Self-pay | Admitting: Oncology

## 2018-07-12 ENCOUNTER — Inpatient Hospital Stay (HOSPITAL_BASED_OUTPATIENT_CLINIC_OR_DEPARTMENT_OTHER): Payer: PPO | Admitting: Oncology

## 2018-07-12 DIAGNOSIS — C50912 Malignant neoplasm of unspecified site of left female breast: Secondary | ICD-10-CM | POA: Diagnosis not present

## 2018-07-12 DIAGNOSIS — B370 Candidal stomatitis: Secondary | ICD-10-CM

## 2018-07-12 DIAGNOSIS — D701 Agranulocytosis secondary to cancer chemotherapy: Secondary | ICD-10-CM | POA: Diagnosis not present

## 2018-07-12 DIAGNOSIS — B029 Zoster without complications: Secondary | ICD-10-CM

## 2018-07-12 DIAGNOSIS — T451X5A Adverse effect of antineoplastic and immunosuppressive drugs, initial encounter: Secondary | ICD-10-CM

## 2018-07-12 DIAGNOSIS — C50919 Malignant neoplasm of unspecified site of unspecified female breast: Secondary | ICD-10-CM

## 2018-07-12 DIAGNOSIS — E876 Hypokalemia: Secondary | ICD-10-CM

## 2018-07-12 LAB — CBC WITH DIFFERENTIAL/PLATELET
Abs Immature Granulocytes: 0.03 10*3/uL (ref 0.00–0.07)
Basophils Absolute: 0 10*3/uL (ref 0.0–0.1)
Basophils Relative: 3 %
Eosinophils Absolute: 0 10*3/uL (ref 0.0–0.5)
Eosinophils Relative: 1 %
HCT: 33.3 % — ABNORMAL LOW (ref 36.0–46.0)
Hemoglobin: 11 g/dL — ABNORMAL LOW (ref 12.0–15.0)
Immature Granulocytes: 2 %
Lymphocytes Relative: 15 %
Lymphs Abs: 0.2 10*3/uL — ABNORMAL LOW (ref 0.7–4.0)
MCH: 29.3 pg (ref 26.0–34.0)
MCHC: 33 g/dL (ref 30.0–36.0)
MCV: 88.6 fL (ref 80.0–100.0)
Monocytes Absolute: 0.1 10*3/uL (ref 0.1–1.0)
Monocytes Relative: 5 %
Neutro Abs: 1 10*3/uL — ABNORMAL LOW (ref 1.7–7.7)
Neutrophils Relative %: 74 %
Platelets: 141 10*3/uL — ABNORMAL LOW (ref 150–400)
RBC: 3.76 MIL/uL — ABNORMAL LOW (ref 3.87–5.11)
RDW: 15.7 % — ABNORMAL HIGH (ref 11.5–15.5)
Smear Review: NORMAL
WBC: 1.3 10*3/uL — CL (ref 4.0–10.5)
nRBC: 0 % (ref 0.0–0.2)

## 2018-07-12 LAB — COMPREHENSIVE METABOLIC PANEL
ALT: 66 U/L — ABNORMAL HIGH (ref 0–44)
AST: 58 U/L — ABNORMAL HIGH (ref 15–41)
Albumin: 3.9 g/dL (ref 3.5–5.0)
Alkaline Phosphatase: 119 U/L (ref 38–126)
Anion gap: 13 (ref 5–15)
BUN: 16 mg/dL (ref 8–23)
CO2: 26 mmol/L (ref 22–32)
Calcium: 9.2 mg/dL (ref 8.9–10.3)
Chloride: 97 mmol/L — ABNORMAL LOW (ref 98–111)
Creatinine, Ser: 0.96 mg/dL (ref 0.44–1.00)
GFR calc Af Amer: >60 mL/min (ref >60)
GFR calc non Af Amer: >60 mL/min (ref >60)
Glucose, Bld: 217 mg/dL — ABNORMAL HIGH (ref 70–99)
Potassium: 4.1 mmol/L (ref 3.5–5.1)
Sodium: 136 mmol/L (ref 135–145)
Total Bilirubin: 0.9 mg/dL (ref 0.3–1.2)
Total Protein: 6.7 g/dL (ref 6.5–8.1)

## 2018-07-12 LAB — MAGNESIUM: Magnesium: 1.6 mg/dL — ABNORMAL LOW (ref 1.7–2.4)

## 2018-07-12 MED ORDER — MAGNESIUM CHLORIDE 64 MG PO TBEC: 1.0000 | DELAYED_RELEASE_TABLET | Freq: Every day | ORAL | 0 refills | Status: DC

## 2018-07-12 NOTE — Progress Notes (Signed)
Patient contacted for telehealth visit. "shingles are better and drying."

## 2018-07-13 ENCOUNTER — Inpatient Hospital Stay: Payer: PPO

## 2018-07-13 ENCOUNTER — Other Ambulatory Visit: Payer: Self-pay

## 2018-07-13 VITALS — BP 111/76 | HR 99 | Temp 98.0°F | Resp 19

## 2018-07-13 DIAGNOSIS — Z5111 Encounter for antineoplastic chemotherapy: Secondary | ICD-10-CM | POA: Diagnosis not present

## 2018-07-13 DIAGNOSIS — C50919 Malignant neoplasm of unspecified site of unspecified female breast: Secondary | ICD-10-CM

## 2018-07-13 DIAGNOSIS — E860 Dehydration: Secondary | ICD-10-CM

## 2018-07-13 MED ORDER — SODIUM CHLORIDE 0.9 % IV SOLN: 2.0000 g | Freq: Once | INTRAVENOUS | Status: DC

## 2018-07-13 MED ORDER — HEPARIN SOD (PORK) LOCK FLUSH 100 UNIT/ML IV SOLN
500.0000 [IU] | Freq: Once | INTRAVENOUS | Status: AC
  Administered 2018-07-13: 500 [IU] via INTRAVENOUS
  Filled 2018-07-13: qty 5

## 2018-07-13 MED ORDER — SODIUM CHLORIDE 0.9 % IV SOLN
INTRAVENOUS | Status: DC
  Administered 2018-07-13: 10:00:00 via INTRAVENOUS
  Filled 2018-07-13: qty 250

## 2018-07-13 MED ORDER — MAGNESIUM SULFATE 2 GM/50ML IV SOLN
2.0000 g | Freq: Once | INTRAVENOUS | Status: AC
  Administered 2018-07-13: 2 g via INTRAVENOUS
  Filled 2018-07-13: qty 50

## 2018-07-16 DIAGNOSIS — M5116 Intervertebral disc disorders with radiculopathy, lumbar region: Secondary | ICD-10-CM | POA: Diagnosis not present

## 2018-07-16 NOTE — Progress Notes (Signed)
[facility] HEMATOLOGY-ONCOLOGY TeleHEALTH VISIT PROGRESS NOTE  I connected with Megan Harris on 07/16/18 at  2:00 PM EDT by video enabled telemedicine visit and verified that I am speaking with the correct person using two identifiers. I discussed the limitations, risks, security and privacy concerns of performing an evaluation and management service by telemedicine and the availability of in-person appointments. I also discussed with the patient that there may be a patient responsible charge related to this service. The patient expressed understanding and agreed to proceed.   Other persons participating in the visit and their role in the encounter:  Janeann Merl, RN, check in patient.  Patient's daughter Marzetta Board   Patient's location: Home  Provider's location: work  Risk analyst Complaint: Follow-up for evaluation of chemotherapy tolerability.   INTERVAL HISTORY Megan Harris is a 68 y.o. female who has above history reviewed by me today presents for follow up visit for evaluation of chemotherapy tolerability. Problems and complaints are listed below:  Status post 4 cycles of dose dense AC.  # Nausea is well controlled with antiemetics.  # Thrush, recurred and she got a another course of Diflucan Thrush improved.  # She does not have much appetite.  # continue to have fatigue.  # Shingle rash is crusting and better.   Denies any fever, vomiting, chest pain, abdominal pain,  Review of Systems  Constitutional: Positive for fatigue. Negative for appetite change, chills and fever.  HENT:   Negative for hearing loss and voice change.        Mucositis/Thrush  Eyes: Negative for eye problems.  Respiratory: Negative for chest tightness and cough.   Cardiovascular: Negative for chest pain.  Gastrointestinal: Negative for abdominal distention, abdominal pain, blood in stool and nausea.  Endocrine: Negative for hot flashes.  Genitourinary: Negative for difficulty urinating and frequency.    Musculoskeletal: Negative for arthralgias.  Skin: Negative for itching and rash.  Neurological: Negative for extremity weakness.  Hematological: Negative for adenopathy.  Psychiatric/Behavioral: Negative for confusion.    Past Medical History:  Diagnosis Date  . Abdominal pain, left lower quadrant 2012  . Anemia    vitamin d deficiency  . Arthritis   . Asthma   . Back pain 2005   lower back arthritis  . Cancer ([place]) 04/26/2018   30mm, T1c, N0 triple negative  . Chronic cholecystitis 2012   has had choleycystectomy.  not an issue  . Constipation 2005  . Endocrine problem 2005  . Family history of bone cancer   . Family history of brain cancer   . Family history of colon cancer   . Family history of lung cancer   . GERD (gastroesophageal reflux disease) 2012  . Hyperlipidemia 2012  . Hypertension 2002  . Nausea with vomiting 2012  . Obesity, unspecified 2012  . Pneumonia   . PONV (postoperative nausea and vomiting)   . Special screening for malignant neoplasms, colon 2012   Past Surgical History:  Procedure Laterality Date  . BILATERAL CARPAL TUNNEL RELEASE Bilateral   . BREAST BIOPSY Left 04/13/2018   Pending Path  . BREAST EXCISIONAL BIOPSY Left early 90s   neg  . BREAST LUMPECTOMY Left 04/26/2018   Procedure: BREAST LUMPECTOMY WITH EXCISION OF SENTINEL NODE;  Surgeon: Robert Bellow, MD;  Location: [facility] ORS;  Service: General;  Laterality: Left;  . BUNIONECTOMY Bilateral 2003  . CHOLECYSTECTOMY  06/04/2010  . COLON RESECTION  2004   due diverticulitis   . COLONOSCOPY  2005   [facility],  Dr. Bary Castilla  . COLONOSCOPY  06/28/2012  . COLOSTOMY  2004  . COLOSTOMY REVERSAL  2004  . FOOT SURGERY Bilateral 2012   plantar faciatis  . HAND SURGERY Bilateral    carpal tunnel  . HERNIA REPAIR  2005   at colostomy site after reversal done  . JOINT REPLACEMENT    . KNEE ARTHROPLASTY Right 04/20/2017   Procedure: COMPUTER ASSISTED TOTAL KNEE ARTHROPLASTY;  Surgeon: Dereck Leep, MD;  Location: [facility] ORS;  Service: Orthopedics;  Laterality: Right;  . KNEE ARTHROSCOPY Right 02/03/2015   Procedure: ARTHROSCOPY right knee, partial medial menisectomy, condyle malleolus, patella and femoral;  Surgeon: Dereck Leep, MD;  Location: [facility] OR[facility];  Service: Orthopedics;  Laterality: Right;  . NASAL SINUS SURGERY     2005  . PORTACATH PLACEMENT Right 04/26/2018   Procedure: INSERTION PORT-A-CATH RIGHT;  Surgeon: Robert Bellow, MD;  Location: [facility];  Service: General;  Laterality: Right;  . SALPINGOOPHORECTOMY  1998  . TONSILLECTOMY    . TUBAL LIGATION  1978  . UPPER GI ENDOSCOPY  06/28/2012  . VAGINAL HYSTERECTOMY  1987    Family History  Problem Relation Age of Onset  . Heart disease Father   . Diabetes Paternal Grandmother   . Diabetes Paternal Grandfather   . Brain cancer Mother 92  . Bone cancer Sister 80  . Colon cancer Maternal Uncle        dx 50s-60s  . Throat cancer Cousin   . Leukemia Cousin   . Lung cancer Cousin   . Ovarian cancer Neg Hx     Social History   Socioeconomic History  . Marital status: Married    Spouse name: Francee Piccolo  . Number of children: 2  . Years of education: Not on file  . Highest education level: Not on file  Occupational History  . 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 Financial resource strain: Not on file  . Food insecurity    Worry: Not on file    Inability: Not on file  . Transportation needs    Medical: Not on file    Non-medical: Not on file  Tobacco Use  . Smoking status: Never Smoker  . Smokeless tobacco: Never Used  Substance and Sexual Activity  . Alcohol use: Not Currently  . Drug use: No  . Sexual activity: Yes    Birth control/protection: Surgical  Lifestyle  . Physical activity    Days per week: Not on file    Minutes per session: Not on file  . Stress: Not on file  Relationships  . Social Herbalist on phone: Not on file    Gets together:  Not on file    Attends religious service: Not on file    Active member of club or organization: Not on file    Attends meetings of clubs or organizations: Not on file    Relationship status: Not on file  . Intimate partner violence    Fear of current or ex partner: Not on file    Emotionally abused: Not on file    Physically abused: Not on file    Forced sexual activity: Not on file  Other Topics Concern  . Not on file  Social History Narrative  . Not on file    Current Outpatient Medications on File Prior to Visit  Medication Sig Dispense Refill  . acetaminophen (TYLENOL) 500 MG tablet Take 500  mg by mouth every 6 (six) hours as needed.    [facility] albuterol (PROVENTIL HFA;VENTOLIN HFA) 108 (90 Base) MCG/ACT inhaler Inhale 2 puffs into the lungs every 6 (six) hours as needed for wheezing or shortness of breath.     [facility] amLODipine (NORVASC) 5 MG tablet Take 5 mg by mouth daily.    [facility] atorvastatin (LIPITOR) 10 MG tablet Take 10 mg by mouth daily.    . Cholecalciferol (VITAMIN D3 MAXIMUM STRENGTH) 5000 units capsule Take 5,000 Units by mouth daily.    [facility] dextromethorphan-guaiFENesin (MUCINEX DM) 30-600 MG 12hr tablet Take 1 tablet by mouth daily as needed for cough.    . fluconazole (DIFLUCAN) 200 MG tablet Take 1 tablet (200 mg total) by mouth daily. 7 tablet 0  . gabapentin (NEURONTIN) 300 MG capsule Take 300 mg by mouth 4 (four) times daily.     . hydrochlorothiazide (HYDRODIURIL) 25 MG tablet Take 25 mg by mouth daily.    . lansoprazole (PREVACID) 30 MG capsule Take 30 mg by mouth 2 (two) times daily.     [facility] levothyroxine (SYNTHROID, LEVOTHROID) 50 MCG tablet Take 50 mcg by mouth daily before breakfast.     . lidocaine (XYLOCAINE) 5 % ointment Apply 1 application topically as needed. 35.44 g 0  . lidocaine-prilocaine (EMLA) cream Apply to affected area once 30 g 3  . losartan (COZAAR) 100 MG tablet Take 100 mg by mouth daily.    . potassium chloride SA (K-DUR) 20 MEQ tablet Take 1 tablet (20 mEq  total) by mouth daily. 30 tablet 0  . promethazine (PHENERGAN) 25 MG tablet Take 1 tablet (25 mg total) by mouth every 6 (six) hours as needed for nausea or vomiting. 30 tablet 1  . valACYclovir (VALTREX) 1000 MG tablet Take 1 tablet (1,000 mg total) by mouth 2 (two) times daily. 60 tablet 0  . zolpidem (AMBIEN) 10 MG tablet Take 10 mg by mouth at bedtime.     . chlorhexidine (PERIDEX) 0.12 % solution Use as directed 15 mLs in the mouth or throat 2 (two) times daily. (Patient not taking: Reported on 07/12/2018) 473 mL 1  . nystatin (MYCOSTATIN) 100000 UNIT/ML suspension Take 5 mLs (500,000 Units total) by mouth 4 (four) times daily. (Patient not taking: Reported on 07/12/2018) 473 mL 0  . ondansetron (ZOFRAN) 8 MG tablet Take 1 tablet (8 mg total) by mouth 2 (two) times daily as needed. Start on the third day after chemotherapy. (Patient not taking: Reported on 07/12/2018) 60 tablet 1   No current facility-administered medications on file prior to visit.     Allergies  Allergen Reactions  . Codeine Other (See Comments)    Goes to sleep and unable to wake up.  [facility] Penicillins Hives    Did it involve swelling of the face/tongue/throat, SOB, or low BP? No Did it involve sudden or severe rash/hives, skin peeling, or any reaction on the inside of your mouth or nose? No Did you need to seek medical attention at a hospital or doctor's office? Yes When did it last happen?50s If all above answers are "NO", may proceed with cephalosporin use.        Observations/Objective: There were no vitals filed for this visit. There is no height or weight on file to calculate BMI.  Pain 0 Physical Exam  Constitutional: She is oriented to person, place, and time. No distress.  HENT:  Head: Normocephalic and atraumatic.  Neck:  [name] has improved  Pulmonary/Chest: Effort normal.  Neurological:  She is alert and oriented to person, place, and time.  Psychiatric: Affect normal.    CBC    Component  Value Date/Time   WBC 1.3 (LL) 07/12/2018 1114   RBC 3.76 (L) 07/12/2018 1114   HGB 11.0 (L) 07/12/2018 1114   HCT 33.3 (L) 07/12/2018 1114   PLT 141 (L) 07/12/2018 1114   MCV 88.6 07/12/2018 1114   MCH 29.3 07/12/2018 1114   MCHC 33.0 07/12/2018 1114   RDW 15.7 (H) 07/12/2018 1114   LYMPHSABS 0.2 (L) 07/12/2018 1114   MONOABS 0.1 07/12/2018 1114   EOSABS 0.0 07/12/2018 1114   BASOSABS 0.0 07/12/2018 1114    CMP     Component Value Date/Time   NA 136 07/12/2018 1114   K 4.1 07/12/2018 1114   CL 97 (L) 07/12/2018 1114   CO2 26 07/12/2018 1114   GLUCOSE 217 (H) 07/12/2018 1114   BUN 16 07/12/2018 1114   CREATININE 0.96 07/12/2018 1114   CALCIUM 9.2 07/12/2018 1114   PROT 6.7 07/12/2018 1114   ALBUMIN 3.9 07/12/2018 1114   AST 58 (H) 07/12/2018 1114   ALT 66 (H) 07/12/2018 1114   ALKPHOS 119 07/12/2018 1114   BILITOT 0.9 07/12/2018 1114   GFRNONAA >60 07/12/2018 1114   GFRAA >60 07/12/2018 1114     Assessment and Plan: 1. Chemotherapy-induced neutropenia (HCC)   2. 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 Thrush   4. Herpes zoster without complication   5. Hypomagnesemia    Labs are reviewed and discussed with patient. Patient had cycles of dose dense AC.  Mild overall tolerating well. Thrush, she received another course of Diflucan and has improved. Chemotherapy-induced neutropenia, neutropenia precaution discussed with patient again. Hypomagnesia, will arrange patient to receive IV magnesium sulfate 2 g x 1. Also send patient Slow-Mag 64 mg daily tablets prescription sent to pharmacy. Nausea, continue to use antiemetics as instructed. Chemotherapy.anemia continue to monitor.  Follow Up Instructions: 1 week for lab and MD assessment prior to weekly Taxol.  I discussed the assessment and treatment plan with the patient. The patient was provided an opportunity to ask questions and all were answered. The patient agreed with the plan and demonstrated an  understanding of the instructions.  The patient was advised to call back or seek an in-person evaluation if the symptoms worsen or if the condition fails to improve as anticipated.   I provided 25 minutes of face-to-face video visit time during this encounter, and > 50% was spent counseling as documented under my assessment & plan.   Earlie Server, MD 07/16/2018 1:56 PM

## 2018-07-17 ENCOUNTER — Other Ambulatory Visit: Payer: Self-pay | Admitting: Oncology

## 2018-07-18 ENCOUNTER — Other Ambulatory Visit: Payer: Self-pay | Admitting: Oncology

## 2018-07-19 ENCOUNTER — Inpatient Hospital Stay: Payer: PPO

## 2018-07-19 ENCOUNTER — Other Ambulatory Visit: Payer: Self-pay

## 2018-07-19 ENCOUNTER — Inpatient Hospital Stay (HOSPITAL_BASED_OUTPATIENT_CLINIC_OR_DEPARTMENT_OTHER): Payer: PPO | Admitting: Oncology

## 2018-07-19 ENCOUNTER — Inpatient Hospital Stay: Payer: PPO | Attending: Oncology

## 2018-07-19 ENCOUNTER — Encounter: Payer: Self-pay | Admitting: Oncology

## 2018-07-19 VITALS — BP 122/84 | HR 80 | Temp 97.8°F | Resp 18 | Wt 238.6 lb

## 2018-07-19 VITALS — BP 118/80 | HR 74 | Resp 17

## 2018-07-19 DIAGNOSIS — Z171 Estrogen receptor negative status [ER-]: Secondary | ICD-10-CM | POA: Insufficient documentation

## 2018-07-19 DIAGNOSIS — C50919 Malignant neoplasm of unspecified site of unspecified female breast: Secondary | ICD-10-CM

## 2018-07-19 DIAGNOSIS — C50212 Malignant neoplasm of upper-inner quadrant of left female breast: Secondary | ICD-10-CM | POA: Diagnosis not present

## 2018-07-19 DIAGNOSIS — Z95828 Presence of other vascular implants and grafts: Secondary | ICD-10-CM

## 2018-07-19 DIAGNOSIS — R5382 Chronic fatigue, unspecified: Secondary | ICD-10-CM | POA: Diagnosis not present

## 2018-07-19 DIAGNOSIS — E876 Hypokalemia: Secondary | ICD-10-CM

## 2018-07-19 DIAGNOSIS — Z5111 Encounter for antineoplastic chemotherapy: Secondary | ICD-10-CM | POA: Diagnosis not present

## 2018-07-19 DIAGNOSIS — B370 Candidal stomatitis: Secondary | ICD-10-CM | POA: Insufficient documentation

## 2018-07-19 DIAGNOSIS — B029 Zoster without complications: Secondary | ICD-10-CM | POA: Insufficient documentation

## 2018-07-19 DIAGNOSIS — G629 Polyneuropathy, unspecified: Secondary | ICD-10-CM | POA: Diagnosis not present

## 2018-07-19 DIAGNOSIS — E871 Hypo-osmolality and hyponatremia: Secondary | ICD-10-CM | POA: Diagnosis not present

## 2018-07-19 DIAGNOSIS — C50912 Malignant neoplasm of unspecified site of left female breast: Secondary | ICD-10-CM

## 2018-07-19 LAB — CBC WITH DIFFERENTIAL/PLATELET
Abs Immature Granulocytes: 1.02 10*3/uL — ABNORMAL HIGH (ref 0.00–0.07)
Basophils Absolute: 0.1 10*3/uL (ref 0.0–0.1)
Basophils Relative: 1 %
Eosinophils Absolute: 0 10*3/uL (ref 0.0–0.5)
Eosinophils Relative: 0 %
HCT: 32.8 % — ABNORMAL LOW (ref 36.0–46.0)
Hemoglobin: 11 g/dL — ABNORMAL LOW (ref 12.0–15.0)
Immature Granulocytes: 11 %
Lymphocytes Relative: 8 %
Lymphs Abs: 0.7 10*3/uL (ref 0.7–4.0)
MCH: 30.1 pg (ref 26.0–34.0)
MCHC: 33.5 g/dL (ref 30.0–36.0)
MCV: 89.6 fL (ref 80.0–100.0)
Monocytes Absolute: 0.7 10*3/uL (ref 0.1–1.0)
Monocytes Relative: 8 %
Neutro Abs: 6.9 10*3/uL (ref 1.7–7.7)
Neutrophils Relative %: 72 %
Platelets: 155 10*3/uL (ref 150–400)
RBC: 3.66 MIL/uL — ABNORMAL LOW (ref 3.87–5.11)
RDW: 17.2 % — ABNORMAL HIGH (ref 11.5–15.5)
WBC: 9.4 10*3/uL (ref 4.0–10.5)
nRBC: 0.6 % — ABNORMAL HIGH (ref 0.0–0.2)

## 2018-07-19 LAB — COMPREHENSIVE METABOLIC PANEL
ALT: 54 U/L — ABNORMAL HIGH (ref 0–44)
AST: 39 U/L (ref 15–41)
Albumin: 3.8 g/dL (ref 3.5–5.0)
Alkaline Phosphatase: 86 U/L (ref 38–126)
Anion gap: 13 (ref 5–15)
BUN: 12 mg/dL (ref 8–23)
CO2: 25 mmol/L (ref 22–32)
Calcium: 9.2 mg/dL (ref 8.9–10.3)
Chloride: 100 mmol/L (ref 98–111)
Creatinine, Ser: 0.87 mg/dL (ref 0.44–1.00)
GFR calc Af Amer: >60 mL/min (ref >60)
GFR calc non Af Amer: >60 mL/min (ref >60)
Glucose, Bld: 202 mg/dL — ABNORMAL HIGH (ref 70–99)
Potassium: 3.6 mmol/L (ref 3.5–5.1)
Sodium: 138 mmol/L (ref 135–145)
Total Bilirubin: 0.4 mg/dL (ref 0.3–1.2)
Total Protein: 6.6 g/dL (ref 6.5–8.1)

## 2018-07-19 LAB — MAGNESIUM: Magnesium: 1.7 mg/dL (ref 1.7–2.4)

## 2018-07-19 MED ORDER — SODIUM CHLORIDE 0.9 % IV SOLN
Freq: Once | INTRAVENOUS | Status: AC
  Administered 2018-07-19: 10:00:00 via INTRAVENOUS
  Filled 2018-07-19: qty 250

## 2018-07-19 MED ORDER — SODIUM CHLORIDE 0.9 % IV SOLN
80.0000 mg/m2 | Freq: Once | INTRAVENOUS | Status: AC
  Administered 2018-07-19: 180 mg via INTRAVENOUS
  Filled 2018-07-19: qty 30

## 2018-07-19 MED ORDER — PROCHLORPERAZINE MALEATE 10 MG PO TABS: 10.0000 mg | ORAL_TABLET | Freq: Four times a day (QID) | ORAL | 1 refills | Status: DC | PRN

## 2018-07-19 MED ORDER — SODIUM CHLORIDE 0.9% FLUSH
10.0000 mL | Freq: Once | INTRAVENOUS | Status: AC
  Administered 2018-07-19: 09:00:00 10 mL via INTRAVENOUS
  Filled 2018-07-19: qty 10

## 2018-07-19 MED ORDER — FAMOTIDINE IN NACL 20-0.9 MG/50ML-% IV SOLN
20.0000 mg | Freq: Once | INTRAVENOUS | Status: AC
  Administered 2018-07-19: 20 mg via INTRAVENOUS
  Filled 2018-07-19: qty 50

## 2018-07-19 MED ORDER — LIDOCAINE-PRILOCAINE 2.5-2.5 % EX CREA: TOPICAL_CREAM | CUTANEOUS | 3 refills | Status: DC

## 2018-07-19 MED ORDER — SODIUM CHLORIDE 0.9% FLUSH
10.0000 mL | Freq: Once | INTRAVENOUS | Status: DC
  Filled 2018-07-19: qty 10

## 2018-07-19 MED ORDER — HEPARIN SOD (PORK) LOCK FLUSH 100 UNIT/ML IV SOLN
500.0000 [IU] | Freq: Once | INTRAVENOUS | Status: DC | PRN
  Filled 2018-07-19: qty 5

## 2018-07-19 MED ORDER — SODIUM CHLORIDE 0.9 % IV SOLN
20.0000 mg | Freq: Once | INTRAVENOUS | Status: AC
  Administered 2018-07-19: 20 mg via INTRAVENOUS
  Filled 2018-07-19: qty 2

## 2018-07-19 MED ORDER — DIPHENHYDRAMINE HCL 50 MG/ML IJ SOLN
50.0000 mg | Freq: Once | INTRAMUSCULAR | Status: AC
  Administered 2018-07-19: 50 mg via INTRAVENOUS
  Filled 2018-07-19: qty 1

## 2018-07-19 MED ORDER — ONDANSETRON HCL 8 MG PO TABS: 8.0000 mg | ORAL_TABLET | Freq: Two times a day (BID) | ORAL | 1 refills | Status: DC | PRN

## 2018-07-19 MED ORDER — HEPARIN SOD (PORK) LOCK FLUSH 100 UNIT/ML IV SOLN
500.0000 [IU] | Freq: Once | INTRAVENOUS | Status: AC
  Administered 2018-07-19: 500 [IU] via INTRAVENOUS

## 2018-07-19 NOTE — Progress Notes (Signed)
DISCONTINUE ON PATHWAY REGIMEN - Breast   Dose-Dense AC q14 days:   A cycle is every 14 days:     Doxorubicin      Cyclophosphamide      Pegfilgrastim-xxxx   **Always confirm dose/schedule in your pharmacy ordering system**  Paclitaxel 80 mg/m2 Weekly:   Administer weekly:     Paclitaxel   **Always confirm dose/schedule in your pharmacy ordering system**  REASON: Other Reason PRIOR TREATMENT: [id number]: Dose-Dense AC-T (Paclitaxel Weekly) - [Doxorubicin + Cyclophosphamide q14 Days x 4 Cycles, Followed by Paclitaxel 80 mg/m2 Weekly x 12 Weeks] TREATMENT RESPONSE: Unable to Evaluate  START ON PATHWAY REGIMEN - Breast   Paclitaxel 80 mg/m2 Weekly:   Administer weekly:     Paclitaxel   **Always confirm dose/schedule in your pharmacy ordering system**  Dose-Dense AC q14 days:   A cycle is every 14 days:     Doxorubicin      Cyclophosphamide      Pegfilgrastim-xxxx   **Always confirm dose/schedule in your pharmacy ordering system**  Patient Characteristics: Postoperative without Neoadjuvant Therapy (Pathologic Staging), Invasive Disease, Adjuvant Therapy, HER2 Negative/Unknown/Equivocal, ER Negative/Unknown, Node Negative, pT1a-c, N65m or pT1c or Higher, pN0 Therapeutic Status: Postoperative without Neoadjuvant Therapy (Pathologic Staging) AJCC Grade: G3 AJCC N Category: pN0 AJCC M Category: cM0 ER Status: Negative (-) AJCC 8 Stage Grouping: IB HER2 Status: Negative (-) Oncotype Dx Recurrence Score: Not Appropriate AJCC T Category: pT1c PR Status: Negative (-) Intent of Therapy: Curative Intent, Discussed with Patient

## 2018-07-19 NOTE — Progress Notes (Signed)
Hematology/Oncology progress note [facility] [phone] [phone]   Patient Care Team: Idelle Crouch, MD as PCP - General (Internal Medicine) Bary Castilla Forest Gleason, MD as Consulting Physician (General Surgery)  REFERRING PROVIDER: Idelle Crouch, MD REASON FOR VISIT:  Follow up for adjuvant chemotherapy for breast cancer.   HISTORY OF PRESENTING ILLNESS:  Megan Harris is a  68 y.o.  female present for management of  breast cancer  # Stage IB left triple negative breast cancer   03/27/2018 which showed left breast mass. She had diagnostic and ultrasound on 04/07/2018 which showed 0.9cm x 0.9cm x 1.4cm irregular border mixed echotexture mass at the left breast 11 o'clock 7 cm from nipple correlating to the mammographic finding. Ultrasound of the left axilla is negative She underwent biopsy of left breast mass. 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 Estrogen receptor negative, progesterone receptor negative, HER-2 negative.  S/p left lumpectomy and sentinel lymph node biopsy. pT1pN0 05/09/2018 Baseline MUGA showed no focal wall motion abnormality of the left ventricle.  Calculated LVEF 61%  # Adjuvant Chemotherapy ddAC-->T  INTERVAL HISTORY JENISHA FAISON is a 68 y.o. 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 #Patient has finished 4 cycles of dd AC with Onpro G-CSF support. Chronic fatigue at baseline. Left lower extremity shingles rash has crusted and resolved.  Skin sensitivity has improved.  Patient is on Valtrex 1000 mg twice daily for shingle prophylaxis. Nausea is well controlled with Phenergan Oral thrush improved and resolved after finishing a course of Diflucan.  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 Appetite is fair.  She lost 1 pound weight since last visit.  Review of Systems  Constitutional: Positive for appetite change, fatigue and unexpected weight change. Negative for chills and fever.  HENT:   Negative for hearing loss and voice change.   Eyes: Negative for eye problems.  Respiratory: Negative for chest tightness and cough.   Cardiovascular: Negative for chest pain.  Gastrointestinal: Negative for abdominal distention, abdominal pain and blood in stool.  Endocrine: Negative for hot flashes.  Genitourinary: Negative for difficulty urinating and frequency.   Musculoskeletal: Negative for arthralgias.  Skin: Negative for itching and rash.  Neurological: Positive for numbness. Negative for extremity weakness.  Hematological: Negative for adenopathy.  Psychiatric/Behavioral: Negative for confusion. The patient is not nervous/anxious.     MEDICAL HISTORY:  Past Medical History:  Diagnosis Date  . Abdominal pain, left lower quadrant 2012  . Anemia    vitamin d deficiency  . Arthritis   . Asthma   . Back pain 2005   lower back arthritis  . Cancer ([place]) 04/26/2018   73m, T1c, N0 triple negative  . Chronic cholecystitis 2012   has had choleycystectomy.  not an issue  . Constipation 2005  . Endocrine problem 2005  . Family history of bone cancer   . Family history of brain cancer   . Family history of colon cancer   . Family history of lung cancer   . GERD (gastroesophageal reflux disease) 2012  . Hyperlipidemia 2012  . Hypertension 2002  . Nausea with vomiting 2012  . Obesity, unspecified 2012  . Pneumonia   . PONV (postoperative nausea and vomiting)   .  Special screening for malignant neoplasms, colon 2012    SURGICAL HISTORY: Past Surgical History:  Procedure Laterality Date  . BILATERAL CARPAL TUNNEL RELEASE Bilateral   . BREAST BIOPSY Left 04/13/2018   Pending  Path  . BREAST EXCISIONAL BIOPSY Left early 90s   neg  . BREAST LUMPECTOMY Left 04/26/2018   Procedure: BREAST LUMPECTOMY WITH EXCISION OF SENTINEL NODE;  Surgeon: Robert Bellow, MD;  Location: [facility] ORS;  Service: General;  Laterality: Left;  . BUNIONECTOMY Bilateral 2003  . CHOLECYSTECTOMY  06/04/2010  . COLON RESECTION  2004   due diverticulitis   . COLONOSCOPY  2005   [place], Dr. Bary Castilla  . COLONOSCOPY  06/28/2012  . COLOSTOMY  2004  . COLOSTOMY REVERSAL  2004  . FOOT SURGERY Bilateral 2012   plantar faciatis  . HAND SURGERY Bilateral    carpal tunnel  . HERNIA REPAIR  2005   at colostomy site after reversal done  . JOINT REPLACEMENT    . KNEE ARTHROPLASTY Right 04/20/2017   Procedure: COMPUTER ASSISTED TOTAL KNEE ARTHROPLASTY;  Surgeon: Dereck Leep, MD;  Location: [facility] ORS;  Service: Orthopedics;  Laterality: Right;  . KNEE ARTHROSCOPY Right 02/03/2015   Procedure: ARTHROSCOPY right knee, partial medial menisectomy, condyle malleolus, patella and femoral;  Surgeon: Dereck Leep, MD;  Location: [facility] ORS;  Service: Orthopedics;  Laterality: Right;  . NASAL SINUS SURGERY     2005  . PORTACATH PLACEMENT Right 04/26/2018   Procedure: INSERTION PORT-A-CATH RIGHT;  Surgeon: Robert Bellow, MD;  Location: [facility] ORS;  Service: General;  Laterality: Right;  . SALPINGOOPHORECTOMY  1998  . TONSILLECTOMY    . TUBAL LIGATION  1978  . UPPER GI ENDOSCOPY  06/28/2012  . VAGINAL HYSTERECTOMY  1987    SOCIAL HISTORY: Social History   Socioeconomic History  . Marital status: Married    Spouse name: Francee Piccolo  . Number of children: 2  . Years of education: Not on file  . Highest education level: Not on file  Occupational History  . 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 Financial resource strain: Not on file  . Food insecurity    Worry: Not on file    Inability: Not on file  . Transportation needs    Medical: Not on file    Non-medical: Not on  file  Tobacco Use  . Smoking status: Never Smoker  . Smokeless tobacco: Never Used  Substance and Sexual Activity  . Alcohol use: Not Currently  . Drug use: No  . Sexual activity: Yes    Birth control/protection: Surgical  Lifestyle  . Physical activity    Days per week: Not on file    Minutes per session: Not on file  . Stress: Not on file  Relationships  . Social Herbalist on phone: Not on file    Gets together: Not on file    Attends religious service: Not on file    Active member of club or organization: Not on file    Attends meetings of clubs or organizations: Not on file    Relationship status: Not on file  . Intimate partner violence    Fear of current or ex partner: Not on file    Emotionally abused: Not on file    Physically abused: Not on file    Forced sexual activity: Not on file  Other Topics Concern  . Not on file  Social History  Narrative  . Not on file   Lives at home with husband.  FAMILY HISTORY: Family History  Problem Relation Age of Onset  . Heart disease Father   . Diabetes Paternal Grandmother   . Diabetes Paternal Grandfather   . Brain cancer Mother 35  . Bone cancer Sister 13  . Colon cancer Maternal Uncle        dx 50s-60s  . Throat cancer Cousin   . Leukemia Cousin   . Lung cancer Cousin   . Ovarian cancer Neg Hx     ALLERGIES:  is allergic to codeine and penicillins.  MEDICATIONS:  Current Outpatient Medications  Medication Sig Dispense Refill  . acetaminophen (TYLENOL) 500 MG tablet Take 500 mg by mouth every 6 (six) hours as needed.    Marland Kitchen albuterol (PROVENTIL HFA;VENTOLIN HFA) 108 (90 Base) MCG/ACT inhaler Inhale 2 puffs into the lungs every 6 (six) hours as needed for wheezing or shortness of breath.     Marland Kitchen amLODipine (NORVASC) 5 MG tablet Take 5 mg by mouth daily.    Marland Kitchen atorvastatin (LIPITOR) 10 MG tablet Take 10 mg by mouth daily.    . Cholecalciferol (VITAMIN D3 MAXIMUM STRENGTH) 5000 units capsule Take 5,000 Units  by mouth daily.    Marland Kitchen gabapentin (NEURONTIN) 300 MG capsule Take 300 mg by mouth 4 (four) times daily.     . hydrochlorothiazide (HYDRODIURIL) 25 MG tablet Take 25 mg by mouth daily.    . lansoprazole (PREVACID) 30 MG capsule Take 30 mg by mouth 2 (two) times daily.     Marland Kitchen levothyroxine (SYNTHROID, LEVOTHROID) 50 MCG tablet Take 50 mcg by mouth daily before breakfast.     . lidocaine-prilocaine (EMLA) cream Apply to affected area once 30 g 3  . losartan (COZAAR) 100 MG tablet Take 100 mg by mouth daily.    . magnesium chloride (SLOW-MAG) 64 MG TBEC SR tablet Take 1 tablet (64 mg total) by mouth daily. 30 tablet 0  . nystatin (MYCOSTATIN) 100000 UNIT/ML suspension Take 5 mLs (500,000 Units total) by mouth 4 (four) times daily. 473 mL 0  . potassium chloride SA (K-DUR) 20 MEQ tablet Take 1 tablet (20 mEq total) by mouth daily. 30 tablet 0  . promethazine (PHENERGAN) 25 MG tablet TAKE ONE TABLET BY MOUTH EVERY 6 HOURS AS NEEDED FOR NAUSEA / VOMITING 60 tablet 1  . valACYclovir (VALTREX) 1000 MG tablet Take 1 tablet (1,000 mg total) by mouth 2 (two) times daily. 60 tablet 0  . zolpidem (AMBIEN) 10 MG tablet Take 10 mg by mouth at bedtime.     . chlorhexidine (PERIDEX) 0.12 % solution Use as directed 15 mLs in the mouth or throat 2 (two) times daily. (Patient not taking: Reported on 07/12/2018) 473 mL 1  . dextromethorphan-guaiFENesin (MUCINEX DM) 30-600 MG 12hr tablet Take 1 tablet by mouth daily as needed for cough.    . fluconazole (DIFLUCAN) 200 MG tablet Take 1 tablet (200 mg total) by mouth daily. (Patient not taking: Reported on 07/19/2018) 7 tablet 0  . lidocaine (XYLOCAINE) 5 % ointment Apply 1 application topically as needed. (Patient not taking: Reported on 07/19/2018) 35.44 g 0  . ondansetron (ZOFRAN) 8 MG tablet Take 1 tablet (8 mg total) by mouth 2 (two) times daily as needed (Nausea or vomiting). (Patient not taking: Reported on 07/19/2018) 30 tablet 1  . prochlorperazine (COMPAZINE) 10 MG tablet  Take 1 tablet (10 mg total) by mouth every 6 (six) hours as needed (Nausea or vomiting). (  Patient not taking: Reported on 07/19/2018) 30 tablet 1   No current facility-administered medications for this visit.      PHYSICAL EXAMINATION: ECOG PERFORMANCE STATUS: 0 - Asymptomatic Vitals:   07/19/18 0910  BP: 122/84  Pulse: 80  Resp: 18  Temp: 97.8 F (36.6 C)   Filed Weights   07/19/18 0910  Weight: 238 lb 9.6 oz (108.2 kg)    Physical Exam Constitutional:      General: She is not in acute distress.    Appearance: She is obese.  HENT:     Head: Normocephalic and atraumatic.  Eyes:     General: No scleral icterus.    Pupils: Pupils are equal, round, and reactive to light.  Neck:     Musculoskeletal: Normal range of motion and neck supple.  Cardiovascular:     Rate and Rhythm: Normal rate and regular rhythm.     Heart sounds: Normal heart sounds.  Pulmonary:     Effort: Pulmonary effort is normal. No respiratory distress.     Breath sounds: No wheezing.  Abdominal:     General: Bowel sounds are normal. There is no distension.     Palpations: Abdomen is soft. There is no mass.     Tenderness: There is no abdominal tenderness.  Musculoskeletal: Normal range of motion.        General: No deformity.  Skin:    General: Skin is warm and dry.     Findings: No erythema or rash.  Neurological:     Mental Status: She is alert and oriented to person, place, and time.     Cranial Nerves: No cranial nerve deficit.     Coordination: Coordination normal.  Psychiatric:        Behavior: Behavior normal.        Thought Content: Thought content normal.       LABORATORY DATA:  I have reviewed the data as listed Lab Results  Component Value Date   WBC 9.4 07/19/2018   HGB 11.0 (L) 07/19/2018   HCT 32.8 (L) 07/19/2018   MCV 89.6 07/19/2018   PLT 155 07/19/2018   Recent Labs    07/05/18 0830 07/07/18 0909 07/12/18 1114 07/19/18 0845  NA 137 140 136 138  K 2.8* 4.0 4.1  3.6  CL 97* 99 97* 100  CO2 _0 GLUCOSE 178* 166* 217* 202*  BUN _1 CREATININE 0.85 0.86 0.96 0.87  CALCIUM 9.2 9.1 9.2 9.2  GFRNONAA >60 >60 >60 >60  GFRAA >60 >60 >60 >60  PROT 6.7  --  6.7 6.6  ALBUMIN 3.8  --  3.9 3.8  AST 36  --  58* 39  ALT 47*  --  66* 54*  ALKPHOS 89  --  119 86  BILITOT 0.5  --  0.9 0.4   Iron/TIBC/Ferritin/ %Sat No results found for: IRON, TIBC, FERRITIN, IRONPCTSAT   Pathology 04/26/2018 CASE: [id number]  PATIENT: Clovis Cao  Surgical Pathology Report DIAGNOSIS:  A. BREAST, LEFT UPPER INNER QUADRANT; WIDE EXCISION:  - INVASIVE MAMMARY CARCINOMA.  - SEE CANCER SUMMARY BELOW.  - BIOPSY SITE CHANGE WITH HEMATOMA AND RIBBON CLIP.  - UNREMARKABLE SKIN.  B. SENTINEL LYMPH NODE #1; EXCISION:  - ONE LYMPH NODE NEGATIVE FOR MALIGNANCY (0/1).  C. NEW BREAST MARGIN, SUPERIOR; EXCISION:  - BENIGN BREAST TISSUE WITH BIOPSY CHANGE AND HEMATOMA.  - NEGATIVE FOR MALIGNANCY.  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, without comedonecrosis  Margins:    Invasive Carcinoma Margins: Uninvolved by invasive carcinoma            Distance from closest margin: 1.7 mm            Specify closest margin: Anterior       DCIS Margins: Uninvolved by DCIS            Distance from closest margin: 1.2 mm            Specify closest margin: Anterior  Regional Lymph Nodes: Uninvolved by tumor cells    Total Number of Lymph Nodes Examined: 1        Number of Sentinel Nodes Examined: 1  Treatment Effect in the Breast: No known presurgical therapy  Lymphovascular Invasion: Not identified  Pathologic Stage Classification (pTNM, AJCC 8th Edition): pT1c pN0 (sn)   Breast Biomarker Testing Performed on Previous Biopsy: [id number]  Estrogen Receptor (ER) Status: NEGATIVE  Progesterone Receptor (PgR) Status: NEGATIVE  HER2 (by immunohistochemistry): NEGATIVE     ASSESSMENT & PLAN:  1. 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 ([place])   2. Hypokalemia   3. Hypomagnesemia   4. Thrush   5. Herpes zoster without complication   6. Encounter for antineoplastic chemotherapy   Cancer Staging 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 [facility]) Staging form: Breast, AJCC 8th Edition - Clinical stage from 04/28/2018: Stage IB (cT1c, cN0, cM0, G3, ER-, PR-, HER2-) - Signed by Earlie Server, MD on 04/29/2018 - Pathologic stage from 05/25/2018: Stage IB (pT1c, pN0(sn), cM0, G3, ER-, PR-, HER2-) - Signed by Earlie Server, MD on 05/25/2018  # Stage IB left triple negative breast cancer S/p left lumpectomy and sentinel lymph node biopsy.  Currently on adjuvant chemotherapy. Labs are reviewed and discussed with patient. Counts acceptable to proceed with cycle 1 Taxol. We discussed again about potential side effects of Taxol.  #Pre-existing bilateral feet neuropathy, can potentially exacerbate after Taxol. Continue gabapentin 300 mg 4 times daily.  Close monitor. Neuropathy precaution discussed with patient.  #Thrush/mucositis.  Finished course of Diflucan.  Continue nystatin oral rinse. #Shingle, symptomatically better.  Continue Valtrex 1000 mg twice daily, to finish 30 days course.  #Hypokalemia, potassium stable.  Continue potassium replacement 20 mEq daily.  Continue to monitor. #Hypo-magnesium, continue Slow-Mag 1 tablet daily.  Follow-up in 1 week  for evaluation prior to starting weekly Taxol.  All questions were answered. The patient knows to call  the clinic with any problems questions or concerns.  Earlie Server, MD, PhD 07/19/2018

## 2018-07-19 NOTE — Progress Notes (Signed)
ON PATHWAY REGIMEN - Breast  No Change  Continue With Treatment as Ordered.   Dose-Dense AC q14 days:   A cycle is every 14 days:     Doxorubicin      Cyclophosphamide      Pegfilgrastim-xxxx   **Always confirm dose/schedule in your pharmacy ordering system**  Paclitaxel 80 mg/m2 Weekly:   Administer weekly:     Paclitaxel   **Always confirm dose/schedule in your pharmacy ordering system**  Patient Characteristics: Postoperative without Neoadjuvant Therapy (Pathologic Staging), Invasive Disease, Adjuvant Therapy, HER2 Negative/Unknown/Equivocal, ER Negative/Unknown, Node Negative, pT1a-c, pN0/N45m or pT2 or Higher, pN0 Therapeutic Status: Postoperative without Neoadjuvant Therapy (Pathologic Staging) AJCC Grade: G3 AJCC N Category: pN0 AJCC M Category: cM0 ER Status: Negative (-) AJCC 8 Stage Grouping: IB HER2 Status: Negative (-) Oncotype Dx Recurrence Score: Not Appropriate AJCC T Category: pT1c PR Status: Negative (-) Intent of Therapy: Curative Intent, Discussed with Patient

## 2018-07-19 NOTE — Progress Notes (Signed)
DISCONTINUE ON PATHWAY REGIMEN - Breast   Dose-Dense AC q14 days:   A cycle is every 14 days:     Doxorubicin      Cyclophosphamide      Pegfilgrastim-xxxx   **Always confirm dose/schedule in your pharmacy ordering system**  Paclitaxel 80 mg/m2 Weekly:   Administer weekly:     Paclitaxel   **Always confirm dose/schedule in your pharmacy ordering system**  REASON: Other Reason PRIOR TREATMENT: [id number]: Dose-Dense AC-T (Paclitaxel Weekly) - [Doxorubicin + Cyclophosphamide q14 Days x 4 Cycles, Followed by Paclitaxel 80 mg/m2 Weekly x 12 Weeks] TREATMENT RESPONSE: Unable to Evaluate  START ON PATHWAY REGIMEN - Breast   Dose-Dense AC q14 days:   A cycle is every 14 days:     Doxorubicin      Cyclophosphamide      Pegfilgrastim-xxxx   **Always confirm dose/schedule in your pharmacy ordering system**  Paclitaxel 80 mg/m2 Weekly:   Administer weekly:     Paclitaxel   **Always confirm dose/schedule in your pharmacy ordering system**  Patient Characteristics: Postoperative without Neoadjuvant Therapy (Pathologic Staging), Invasive Disease, Adjuvant Therapy, HER2 Negative/Unknown/Equivocal, ER Negative/Unknown, Node Negative, pT1a-c, N52m or pT1c or Higher, pN0 Therapeutic Status: Postoperative without Neoadjuvant Therapy (Pathologic Staging) AJCC Grade: G3 AJCC N Category: pN0 AJCC M Category: cM0 ER Status: Negative (-) AJCC 8 Stage Grouping: IB HER2 Status: Negative (-) Oncotype Dx Recurrence Score: Not Appropriate AJCC T Category: pT1c PR Status: Negative (-) Intent of Therapy: Curative Intent, Discussed with Patient

## 2018-07-25 ENCOUNTER — Other Ambulatory Visit: Payer: Self-pay

## 2018-07-26 ENCOUNTER — Other Ambulatory Visit: Payer: Self-pay

## 2018-07-26 ENCOUNTER — Inpatient Hospital Stay (HOSPITAL_BASED_OUTPATIENT_CLINIC_OR_DEPARTMENT_OTHER): Payer: PPO | Admitting: Oncology

## 2018-07-26 ENCOUNTER — Encounter: Payer: Self-pay | Admitting: Oncology

## 2018-07-26 ENCOUNTER — Inpatient Hospital Stay: Payer: PPO

## 2018-07-26 VITALS — BP 122/83 | HR 96 | Temp 96.7°F | Resp 18 | Wt 239.1 lb

## 2018-07-26 DIAGNOSIS — C50212 Malignant neoplasm of upper-inner quadrant of left female breast: Secondary | ICD-10-CM

## 2018-07-26 DIAGNOSIS — Z5111 Encounter for antineoplastic chemotherapy: Secondary | ICD-10-CM

## 2018-07-26 DIAGNOSIS — E876 Hypokalemia: Secondary | ICD-10-CM | POA: Diagnosis not present

## 2018-07-26 DIAGNOSIS — C50919 Malignant neoplasm of unspecified site of unspecified female breast: Secondary | ICD-10-CM

## 2018-07-26 DIAGNOSIS — R5383 Other fatigue: Secondary | ICD-10-CM

## 2018-07-26 DIAGNOSIS — K1230 Oral mucositis (ulcerative), unspecified: Secondary | ICD-10-CM | POA: Diagnosis not present

## 2018-07-26 DIAGNOSIS — B370 Candidal stomatitis: Secondary | ICD-10-CM | POA: Diagnosis not present

## 2018-07-26 DIAGNOSIS — E860 Dehydration: Secondary | ICD-10-CM

## 2018-07-26 DIAGNOSIS — R110 Nausea: Secondary | ICD-10-CM | POA: Diagnosis not present

## 2018-07-26 DIAGNOSIS — G629 Polyneuropathy, unspecified: Secondary | ICD-10-CM | POA: Diagnosis not present

## 2018-07-26 DIAGNOSIS — B029 Zoster without complications: Secondary | ICD-10-CM

## 2018-07-26 LAB — CBC WITH DIFFERENTIAL/PLATELET
Abs Immature Granulocytes: 0.03 10*3/uL (ref 0.00–0.07)
Basophils Absolute: 0.1 10*3/uL (ref 0.0–0.1)
Basophils Relative: 1 %
Eosinophils Absolute: 0 10*3/uL (ref 0.0–0.5)
Eosinophils Relative: 1 %
HCT: 30.9 % — ABNORMAL LOW (ref 36.0–46.0)
Hemoglobin: 10.5 g/dL — ABNORMAL LOW (ref 12.0–15.0)
Immature Granulocytes: 1 %
Lymphocytes Relative: 11 %
Lymphs Abs: 0.5 10*3/uL — ABNORMAL LOW (ref 0.7–4.0)
MCH: 30.3 pg (ref 26.0–34.0)
MCHC: 34 g/dL (ref 30.0–36.0)
MCV: 89 fL (ref 80.0–100.0)
Monocytes Absolute: 0.5 10*3/uL (ref 0.1–1.0)
Monocytes Relative: 10 %
Neutro Abs: 3.4 10*3/uL (ref 1.7–7.7)
Neutrophils Relative %: 76 %
Platelets: 213 10*3/uL (ref 150–400)
RBC: 3.47 MIL/uL — ABNORMAL LOW (ref 3.87–5.11)
RDW: 17.5 % — ABNORMAL HIGH (ref 11.5–15.5)
WBC: 4.5 10*3/uL (ref 4.0–10.5)
nRBC: 0 % (ref 0.0–0.2)

## 2018-07-26 LAB — COMPREHENSIVE METABOLIC PANEL
ALT: 68 U/L — ABNORMAL HIGH (ref 0–44)
AST: 68 U/L — ABNORMAL HIGH (ref 15–41)
Albumin: 3.6 g/dL (ref 3.5–5.0)
Alkaline Phosphatase: 63 U/L (ref 38–126)
Anion gap: 13 (ref 5–15)
BUN: 18 mg/dL (ref 8–23)
CO2: 23 mmol/L (ref 22–32)
Calcium: 8.8 mg/dL — ABNORMAL LOW (ref 8.9–10.3)
Chloride: 101 mmol/L (ref 98–111)
Creatinine, Ser: 1.01 mg/dL — ABNORMAL HIGH (ref 0.44–1.00)
GFR calc Af Amer: >60 mL/min (ref >60)
GFR calc non Af Amer: 56 mL/min — ABNORMAL LOW (ref >60)
Glucose, Bld: 199 mg/dL — ABNORMAL HIGH (ref 70–99)
Potassium: 3 mmol/L — ABNORMAL LOW (ref 3.5–5.1)
Sodium: 137 mmol/L (ref 135–145)
Total Bilirubin: 0.7 mg/dL (ref 0.3–1.2)
Total Protein: 6.5 g/dL (ref 6.5–8.1)

## 2018-07-26 LAB — MAGNESIUM: Magnesium: 1.7 mg/dL (ref 1.7–2.4)

## 2018-07-26 MED ORDER — SODIUM CHLORIDE 0.9 % IV SOLN
80.0000 mg/m2 | Freq: Once | INTRAVENOUS | Status: AC
  Administered 2018-07-26: 12:00:00 180 mg via INTRAVENOUS
  Filled 2018-07-26: qty 30

## 2018-07-26 MED ORDER — SODIUM CHLORIDE 0.9 % IV SOLN
20.0000 meq | Freq: Once | INTRAVENOUS | Status: AC
  Administered 2018-07-26: 12:00:00 20 meq via INTRAVENOUS
  Filled 2018-07-26: qty 10

## 2018-07-26 MED ORDER — FAMOTIDINE IN NACL 20-0.9 MG/50ML-% IV SOLN
20.0000 mg | Freq: Once | INTRAVENOUS | Status: AC
  Administered 2018-07-26: 11:00:00 20 mg via INTRAVENOUS
  Filled 2018-07-26: qty 50

## 2018-07-26 MED ORDER — DIPHENHYDRAMINE HCL 50 MG/ML IJ SOLN
50.0000 mg | Freq: Once | INTRAMUSCULAR | Status: AC
  Administered 2018-07-26: 11:00:00 50 mg via INTRAVENOUS
  Filled 2018-07-26: qty 1

## 2018-07-26 MED ORDER — HEPARIN SOD (PORK) LOCK FLUSH 100 UNIT/ML IV SOLN
INTRAVENOUS | Status: AC
  Filled 2018-07-26: qty 5

## 2018-07-26 MED ORDER — SODIUM CHLORIDE 0.9 % IV SOLN
20.0000 mg | Freq: Once | INTRAVENOUS | Status: AC
  Administered 2018-07-26: 12:00:00 20 mg via INTRAVENOUS
  Filled 2018-07-26: qty 2

## 2018-07-26 MED ORDER — HEPARIN SOD (PORK) LOCK FLUSH 100 UNIT/ML IV SOLN
500.0000 [IU] | Freq: Once | INTRAVENOUS | Status: AC
  Administered 2018-07-26: 13:00:00 500 [IU] via INTRAVENOUS
  Filled 2018-07-26: qty 5

## 2018-07-26 MED ORDER — POTASSIUM CHLORIDE CRYS ER 20 MEQ PO TBCR: 40.0000 meq | EXTENDED_RELEASE_TABLET | Freq: Every day | ORAL | 0 refills | Status: DC

## 2018-07-26 MED ORDER — SODIUM CHLORIDE 0.9% FLUSH
10.0000 mL | INTRAVENOUS | Status: DC | PRN
  Administered 2018-07-26: 10:00:00 10 mL via INTRAVENOUS
  Filled 2018-07-26: qty 10

## 2018-07-26 MED ORDER — SODIUM CHLORIDE 0.9 % IV SOLN
Freq: Once | INTRAVENOUS | Status: AC
  Administered 2018-07-26: 11:00:00 via INTRAVENOUS
  Filled 2018-07-26: qty 250

## 2018-07-26 NOTE — Progress Notes (Signed)
Patient here for follow. Pt takes phenergan twice a day for nausea prophylaxis. States she feels weak and complains of pain and burning to feet right after treatment.

## 2018-07-27 MED ORDER — VALACYCLOVIR HCL 500 MG PO TABS: 500.0000 mg | ORAL_TABLET | Freq: Two times a day (BID) | ORAL | 0 refills | Status: DC

## 2018-07-27 NOTE — Progress Notes (Signed)
Hematology/Oncology progress note [facility] [phone] [phone]   Patient Care Team: Idelle Crouch, MD as PCP - General (Internal Medicine) Bary Castilla Forest Gleason, MD as Consulting Physician (General Surgery)  REFERRING PROVIDER: Idelle Crouch, MD REASON FOR VISIT:  Follow up for adjuvant chemotherapy for breast cancer.   HISTORY OF PRESENTING ILLNESS:  Megan Harris is a  68 y.o.  female present for management of  breast cancer  # Stage IB left triple negative breast cancer   03/27/2018 which showed left breast mass. She had diagnostic and ultrasound on 04/07/2018 which showed 0.9cm x 0.9cm x 1.4cm irregular border mixed echotexture mass at the left breast 11 o'clock 7 cm from nipple correlating to the mammographic finding. Ultrasound of the left axilla is negative She underwent biopsy of left breast mass. 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 Estrogen receptor negative, progesterone receptor negative, HER-2 negative.  S/p left lumpectomy and sentinel lymph node biopsy. pT1pN0 05/09/2018 Baseline MUGA showed no focal wall motion abnormality of the left ventricle.  Calculated LVEF 61%  # Adjuvant Chemotherapy ddAC-->T  INTERVAL HISTORY Megan Harris is a 68 y.o. 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 #Patient has finished 4 cycles of dd AC with Onpro G-CSF support. Received 1 cycle of Taxol last week. Chronic fatigue, exacerbated by chemotherapy. Chronic pre-existing bilateral foot numbness and tingling has gotten worse, she applied ice pad to the bottom of her foot and her symptoms resolved.  Also takes gabapentin 300 mg 4 times daily.  Nausea is controlled with Phenergan as needed. She uses  nystatin oral rinse.  Felt there is little thicken layer of her tongue.  Appetite is fair.  Gained 1 pound since last visit  Review of Systems  Constitutional: Positive for appetite change and fatigue. Negative for chills, fever and unexpected weight change.  HENT:   Negative for hearing loss and voice change.   Eyes: Negative for eye problems.  Respiratory: Negative for chest tightness and cough.   Cardiovascular: Negative for chest pain.  Gastrointestinal: Negative for abdominal distention, abdominal pain and blood in stool.  Endocrine: Negative for hot flashes.  Genitourinary: Negative for difficulty urinating and frequency.   Musculoskeletal: Negative for arthralgias.  Skin: Negative for itching and rash.  Neurological: Positive for numbness. Negative for extremity weakness.  Hematological: Negative for adenopathy.  Psychiatric/Behavioral: Negative for confusion. The patient is not nervous/anxious.     MEDICAL HISTORY:  Past Medical History:  Diagnosis Date  . Abdominal pain, left lower quadrant 2012  . Anemia    vitamin d deficiency  . Arthritis   . Asthma   . Back pain 2005   lower back arthritis  . Cancer ([place]) 04/26/2018   74m, T1c, N0 triple negative  . Chronic cholecystitis 2012   has had choleycystectomy.  not an issue  . Constipation 2005  . Endocrine problem 2005  . Family history of bone cancer   . Family history of brain cancer   . Family history of colon cancer   . Family history of lung cancer   . GERD (gastroesophageal reflux disease) 2012  . Hyperlipidemia 2012  . Hypertension 2002  . Nausea with vomiting 2012  . Obesity, unspecified 2012  . Pneumonia   . PONV (postoperative nausea and vomiting)   .  Special screening for malignant neoplasms, colon 2012    SURGICAL HISTORY: Past Surgical History:  Procedure Laterality Date  . BILATERAL CARPAL TUNNEL RELEASE Bilateral   . BREAST BIOPSY Left 04/13/2018   Pending Path  . BREAST EXCISIONAL BIOPSY  Left early 90s   neg  . BREAST LUMPECTOMY Left 04/26/2018   Procedure: BREAST LUMPECTOMY WITH EXCISION OF SENTINEL NODE;  Surgeon: Robert Bellow, MD;  Location: [facility] ORS;  Service: General;  Laterality: Left;  . BUNIONECTOMY Bilateral 2003  . CHOLECYSTECTOMY  06/04/2010  . COLON RESECTION  2004   due diverticulitis   . COLONOSCOPY  2005   [place], Dr. Bary Castilla  . COLONOSCOPY  06/28/2012  . COLOSTOMY  2004  . COLOSTOMY REVERSAL  2004  . FOOT SURGERY Bilateral 2012   plantar faciatis  . HAND SURGERY Bilateral    carpal tunnel  . HERNIA REPAIR  2005   at colostomy site after reversal done  . JOINT REPLACEMENT    . KNEE ARTHROPLASTY Right 04/20/2017   Procedure: COMPUTER ASSISTED TOTAL KNEE ARTHROPLASTY;  Surgeon: Dereck Leep, MD;  Location: [facility] ORS;  Service: Orthopedics;  Laterality: Right;  . KNEE ARTHROSCOPY Right 02/03/2015   Procedure: ARTHROSCOPY right knee, partial medial menisectomy, condyle malleolus, patella and femoral;  Surgeon: Dereck Leep, MD;  Location: [facility] ORS;  Service: Orthopedics;  Laterality: Right;  . NASAL SINUS SURGERY     2005  . PORTACATH PLACEMENT Right 04/26/2018   Procedure: INSERTION PORT-A-CATH RIGHT;  Surgeon: Robert Bellow, MD;  Location: [facility] ORS;  Service: General;  Laterality: Right;  . SALPINGOOPHORECTOMY  1998  . TONSILLECTOMY    . TUBAL LIGATION  1978  . UPPER GI ENDOSCOPY  06/28/2012  . VAGINAL HYSTERECTOMY  1987    SOCIAL HISTORY: Social History   Socioeconomic History  . Marital status: Married    Spouse name: Francee Piccolo  . Number of children: 2  . Years of education: Not on file  . Highest education level: Not on file  Occupational History  . 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 Financial resource strain: Not on file  . Food insecurity    Worry: Not on file    Inability: Not on file  . Transportation needs    Medical: Not on file    Non-medical: Not on file  Tobacco Use  . Smoking  status: Never Smoker  . Smokeless tobacco: Never Used  Substance and Sexual Activity  . Alcohol use: Not Currently  . Drug use: No  . Sexual activity: Yes    Birth control/protection: Surgical  Lifestyle  . Physical activity    Days per week: Not on file    Minutes per session: Not on file  . Stress: Not on file  Relationships  . Social Herbalist on phone: Not on file    Gets together: Not on file    Attends religious service: Not on file    Active member of club or organization: Not on file    Attends meetings of clubs or organizations: Not on file    Relationship status: Not on file  . Intimate partner violence    Fear of current or ex partner: Not on file    Emotionally abused: Not on file    Physically abused: Not on file    Forced sexual activity: Not on file  Other Topics Concern  . Not on file  Social History  Narrative  . Not on file   Lives at home with husband.  FAMILY HISTORY: Family History  Problem Relation Age of Onset  . Heart disease Father   . Diabetes Paternal Grandmother   . Diabetes Paternal Grandfather   . Brain cancer Mother 50  . Bone cancer Sister 45  . Colon cancer Maternal Uncle        dx 50s-60s  . Throat cancer Cousin   . Leukemia Cousin   . Lung cancer Cousin   . Ovarian cancer Neg Hx     ALLERGIES:  is allergic to codeine and penicillins.  MEDICATIONS:  Current Outpatient Medications  Medication Sig Dispense Refill  . acetaminophen (TYLENOL) 500 MG tablet Take 500 mg by mouth every 6 (six) hours as needed.    Marland Kitchen albuterol (PROVENTIL HFA;VENTOLIN HFA) 108 (90 Base) MCG/ACT inhaler Inhale 2 puffs into the lungs every 6 (six) hours as needed for wheezing or shortness of breath.     Marland Kitchen amLODipine (NORVASC) 5 MG tablet Take 5 mg by mouth daily.    Marland Kitchen atorvastatin (LIPITOR) 10 MG tablet Take 10 mg by mouth daily.    . Cholecalciferol (VITAMIN D3 MAXIMUM STRENGTH) 5000 units capsule Take 5,000 Units by mouth daily.    Marland Kitchen  gabapentin (NEURONTIN) 300 MG capsule Take 300 mg by mouth 4 (four) times daily.     . hydrochlorothiazide (HYDRODIURIL) 25 MG tablet Take 25 mg by mouth daily.    . lansoprazole (PREVACID) 30 MG capsule Take 30 mg by mouth 2 (two) times daily.     Marland Kitchen levothyroxine (SYNTHROID, LEVOTHROID) 50 MCG tablet Take 50 mcg by mouth daily before breakfast.     . lidocaine-prilocaine (EMLA) cream Apply to affected area once 30 g 3  . losartan (COZAAR) 100 MG tablet Take 100 mg by mouth daily.    . magnesium chloride (SLOW-MAG) 64 MG TBEC SR tablet Take 1 tablet (64 mg total) by mouth daily. 30 tablet 0  . nystatin (MYCOSTATIN) 100000 UNIT/ML suspension Take 5 mLs (500,000 Units total) by mouth 4 (four) times daily. 473 mL 0  . ondansetron (ZOFRAN) 8 MG tablet Take 1 tablet (8 mg total) by mouth 2 (two) times daily as needed (Nausea or vomiting). 30 tablet 1  . potassium chloride SA (K-DUR) 20 MEQ tablet Take 2 tablets (40 mEq total) by mouth daily. 60 tablet 0  . promethazine (PHENERGAN) 25 MG tablet TAKE ONE TABLET BY MOUTH EVERY 6 HOURS AS NEEDED FOR NAUSEA / VOMITING 60 tablet 1  . valACYclovir (VALTREX) 1000 MG tablet Take 1 tablet (1,000 mg total) by mouth 2 (two) times daily. 60 tablet 0  . zolpidem (AMBIEN) 10 MG tablet Take 10 mg by mouth at bedtime.     . chlorhexidine (PERIDEX) 0.12 % solution Use as directed 15 mLs in the mouth or throat 2 (two) times daily. (Patient not taking: Reported on 07/12/2018) 473 mL 1  . fluconazole (DIFLUCAN) 200 MG tablet Take 1 tablet (200 mg total) by mouth daily. (Patient not taking: Reported on 07/26/2018) 7 tablet 0  . lidocaine (XYLOCAINE) 5 % ointment Apply 1 application topically as needed. (Patient not taking: Reported on 07/26/2018) 35.44 g 0  . prochlorperazine (COMPAZINE) 10 MG tablet Take 1 tablet (10 mg total) by mouth every 6 (six) hours as needed (Nausea or vomiting). (Patient not taking: Reported on 07/19/2018) 30 tablet 1   No current facility-administered  medications for this visit.      PHYSICAL EXAMINATION: ECOG PERFORMANCE  STATUS: 0 - Asymptomatic Vitals:   07/26/18 0945  BP: 122/83  Pulse: 96  Resp: 18  Temp: (!) 96.7 F (35.9 C)   Filed Weights   07/26/18 0945  Weight: 239 lb 1.6 oz (108.5 kg)    Physical Exam Constitutional:      General: She is not in acute distress.    Appearance: She is obese.  HENT:     Head: Normocephalic and atraumatic.  Eyes:     General: No scleral icterus.    Pupils: Pupils are equal, round, and reactive to light.  Neck:     Musculoskeletal: Normal range of motion and neck supple.  Cardiovascular:     Rate and Rhythm: Normal rate and regular rhythm.     Heart sounds: Normal heart sounds.  Pulmonary:     Effort: Pulmonary effort is normal. No respiratory distress.     Breath sounds: No wheezing.  Abdominal:     General: Bowel sounds are normal. There is no distension.     Palpations: Abdomen is soft. There is no mass.     Tenderness: There is no abdominal tenderness.  Musculoskeletal: Normal range of motion.        General: No deformity.  Skin:    General: Skin is warm and dry.     Findings: No erythema or rash.  Neurological:     Mental Status: She is alert and oriented to person, place, and time.     Cranial Nerves: No cranial nerve deficit.     Coordination: Coordination normal.  Psychiatric:        Behavior: Behavior normal.        Thought Content: Thought content normal.       LABORATORY DATA:  I have reviewed the data as listed Lab Results  Component Value Date   WBC 4.5 07/26/2018   HGB 10.5 (L) 07/26/2018   HCT 30.9 (L) 07/26/2018   MCV 89.0 07/26/2018   PLT 213 07/26/2018   Recent Labs    07/12/18 1114 07/19/18 0845 07/26/18 0927  NA 136 138 137  K 4.1 3.6 3.0*  CL 97* 100 101  CO2 26 25 23   GLUCOSE 217* 202* 199*  BUN 16 12 18   CREATININE 0.96 0.87 1.01*  CALCIUM 9.2 9.2 8.8*  GFRNONAA >60 >60 56*  GFRAA >60 >60 >60  PROT 6.7 6.6 6.5  ALBUMIN  3.9 3.8 3.6  AST 58* 39 68*  ALT 66* 54* 68*  ALKPHOS 119 86 63  BILITOT 0.9 0.4 0.7   Iron/TIBC/Ferritin/ %Sat No results found for: IRON, TIBC, FERRITIN, IRONPCTSAT   Pathology 04/26/2018 CASE: [id number]  PATIENT: Megan Harris  Surgical Pathology Report DIAGNOSIS:  A. BREAST, LEFT UPPER INNER QUADRANT; WIDE EXCISION:  - INVASIVE MAMMARY CARCINOMA.  - SEE CANCER SUMMARY BELOW.  - BIOPSY SITE CHANGE WITH HEMATOMA AND RIBBON CLIP.  - UNREMARKABLE SKIN.  B. SENTINEL LYMPH NODE #1; EXCISION:  - ONE LYMPH NODE NEGATIVE FOR MALIGNANCY (0/1).  C. NEW BREAST MARGIN, SUPERIOR; EXCISION:  - BENIGN BREAST TISSUE WITH BIOPSY CHANGE AND HEMATOMA.  - NEGATIVE FOR MALIGNANCY.  CANCER CASE SUMMARY: INVASIVE CARCINOMA OF THE BREAST  Procedure: Wide excision  Specimen Laterality: Left  Tumor Size: 16 mm  Histologic Type: Invasive carcinoma of no special type (ductal)  Histologic Grade ([place] Histologic Score)            Glandular (Acinar)/Tubular Differentiation: 3            Nuclear Pleomorphism: 2  Mitotic Rate: 3            Overall Grade: 3  Ductal Carcinoma In Situ (DCIS): Present, nuclear grade 2-3, without comedonecrosis  Margins:    Invasive Carcinoma Margins: Uninvolved by invasive carcinoma            Distance from closest margin: 1.7 mm            Specify closest margin: Anterior       DCIS Margins: Uninvolved by DCIS            Distance from closest margin: 1.2 mm            Specify closest margin: Anterior  Regional Lymph Nodes: Uninvolved by tumor cells    Total Number of Lymph Nodes Examined: 1       Number of Sentinel Nodes Examined: 1  Treatment Effect in the Breast: No known presurgical therapy  Lymphovascular Invasion: Not identified  Pathologic Stage Classification (pTNM, AJCC 8th Edition): pT1c pN0 (sn)   Breast Biomarker Testing Performed  on Previous Biopsy: [id number]  Estrogen Receptor (ER) Status: NEGATIVE  Progesterone Receptor (PgR) Status: NEGATIVE  HER2 (by immunohistochemistry): NEGATIVE     ASSESSMENT & PLAN:  1. 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 ([place])   2. Hypokalemia   3. Hypomagnesemia   4. Herpes zoster without complication   5. Encounter for antineoplastic chemotherapy   6. Mucositis   7. Neuropathy   Cancer Staging 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 [facility]) Staging form: Breast, AJCC 8th Edition - Clinical stage from 04/28/2018: Stage IB (cT1c, cN0, cM0, G3, ER-, PR-, HER2-) - Signed by Earlie Server, MD on 04/29/2018 - Pathologic stage from 05/25/2018: Stage IB (pT1c, pN0(sn), cM0, G3, ER-, PR-, HER2-) - Signed by Earlie Server, MD on 05/25/2018  # Stage IB left triple negative breast cancer S/p left lumpectomy and sentinel lymph node biopsy.  Status post 1 cycle of weekly Taxol.  Overall tolerating well. With some exacerbation of pre-existing bilateral foot neuropathy.  Manageable symptoms. Continue gabapentin. Labs reviewed and discussed with patient. Counts are acceptable to proceed with cycle 2 Taxol. She may eventually need to have Taxol dose reduced.  Continue close monitoring neuropathy.  Marland Kitchen#Thrush/mucositis.  Continue nystatin oral rinse. #Shingle, symptomatically better, all lesions are crusting.. Recommend patient to finish current course of 30 days Valtrex 1000 mg twice daily, given her immunocompromise state, recommend patient to start on Valtrex 500 mg twice daily after she finished current prescription while on chemotherapy.  #Hypokalemia, potassium 3.0 today we will proceed with IV potassium 20 mEq.  Recommend patient to take potassium chloride 20 mEq today. Starting tomorrow, take potassium 40 mEq daily.  #Hypo-magnesium, continue  Slow-Mag 1 tablet daily.  Follow-up in 1 week  for evaluation prior to starting next cycle of  Taxol.  All questions were answered. The patient  knows to call the clinic with any problems questions or concerns.  Earlie Server, MD, PhD 07/27/2018

## 2018-07-28 ENCOUNTER — Other Ambulatory Visit: Payer: Self-pay | Admitting: Oncology

## 2018-08-02 ENCOUNTER — Inpatient Hospital Stay (HOSPITAL_BASED_OUTPATIENT_CLINIC_OR_DEPARTMENT_OTHER): Payer: PPO | Admitting: Oncology

## 2018-08-02 ENCOUNTER — Encounter: Payer: Self-pay | Admitting: Oncology

## 2018-08-02 ENCOUNTER — Inpatient Hospital Stay: Payer: PPO

## 2018-08-02 ENCOUNTER — Other Ambulatory Visit: Payer: Self-pay

## 2018-08-02 VITALS — BP 124/81 | HR 83 | Temp 97.1°F | Ht 65.0 in | Wt 241.0 lb

## 2018-08-02 DIAGNOSIS — Z171 Estrogen receptor negative status [ER-]: Secondary | ICD-10-CM | POA: Diagnosis not present

## 2018-08-02 DIAGNOSIS — Z5111 Encounter for antineoplastic chemotherapy: Secondary | ICD-10-CM

## 2018-08-02 DIAGNOSIS — B029 Zoster without complications: Secondary | ICD-10-CM

## 2018-08-02 DIAGNOSIS — K1230 Oral mucositis (ulcerative), unspecified: Secondary | ICD-10-CM

## 2018-08-02 DIAGNOSIS — C50212 Malignant neoplasm of upper-inner quadrant of left female breast: Secondary | ICD-10-CM | POA: Diagnosis not present

## 2018-08-02 DIAGNOSIS — B370 Candidal stomatitis: Secondary | ICD-10-CM | POA: Diagnosis not present

## 2018-08-02 DIAGNOSIS — E876 Hypokalemia: Secondary | ICD-10-CM

## 2018-08-02 DIAGNOSIS — R740 Nonspecific elevation of levels of transaminase and lactic acid dehydrogenase [LDH]: Secondary | ICD-10-CM

## 2018-08-02 DIAGNOSIS — G629 Polyneuropathy, unspecified: Secondary | ICD-10-CM | POA: Diagnosis not present

## 2018-08-02 DIAGNOSIS — C50919 Malignant neoplasm of unspecified site of unspecified female breast: Secondary | ICD-10-CM

## 2018-08-02 LAB — COMPREHENSIVE METABOLIC PANEL
ALT: 83 U/L — ABNORMAL HIGH (ref 0–44)
AST: 64 U/L — ABNORMAL HIGH (ref 15–41)
Albumin: 3.5 g/dL (ref 3.5–5.0)
Alkaline Phosphatase: 54 U/L (ref 38–126)
Anion gap: 10 (ref 5–15)
BUN: 12 mg/dL (ref 8–23)
CO2: 26 mmol/L (ref 22–32)
Calcium: 8.9 mg/dL (ref 8.9–10.3)
Chloride: 102 mmol/L (ref 98–111)
Creatinine, Ser: 0.72 mg/dL (ref 0.44–1.00)
GFR calc Af Amer: >60 mL/min (ref >60)
GFR calc non Af Amer: >60 mL/min (ref >60)
Glucose, Bld: 162 mg/dL — ABNORMAL HIGH (ref 70–99)
Potassium: 3.4 mmol/L — ABNORMAL LOW (ref 3.5–5.1)
Sodium: 138 mmol/L (ref 135–145)
Total Bilirubin: 0.7 mg/dL (ref 0.3–1.2)
Total Protein: 6.5 g/dL (ref 6.5–8.1)

## 2018-08-02 LAB — CBC WITH DIFFERENTIAL/PLATELET
Abs Immature Granulocytes: 0.03 10*3/uL (ref 0.00–0.07)
Basophils Absolute: 0.1 10*3/uL (ref 0.0–0.1)
Basophils Relative: 2 %
Eosinophils Absolute: 0.1 10*3/uL (ref 0.0–0.5)
Eosinophils Relative: 2 %
HCT: 31.6 % — ABNORMAL LOW (ref 36.0–46.0)
Hemoglobin: 10.6 g/dL — ABNORMAL LOW (ref 12.0–15.0)
Immature Granulocytes: 1 %
Lymphocytes Relative: 13 %
Lymphs Abs: 0.4 10*3/uL — ABNORMAL LOW (ref 0.7–4.0)
MCH: 30.5 pg (ref 26.0–34.0)
MCHC: 33.5 g/dL (ref 30.0–36.0)
MCV: 91.1 fL (ref 80.0–100.0)
Monocytes Absolute: 0.3 10*3/uL (ref 0.1–1.0)
Monocytes Relative: 10 %
Neutro Abs: 2.5 10*3/uL (ref 1.7–7.7)
Neutrophils Relative %: 72 %
Platelets: 217 10*3/uL (ref 150–400)
RBC: 3.47 MIL/uL — ABNORMAL LOW (ref 3.87–5.11)
RDW: 18.4 % — ABNORMAL HIGH (ref 11.5–15.5)
WBC: 3.5 10*3/uL — ABNORMAL LOW (ref 4.0–10.5)
nRBC: 0 % (ref 0.0–0.2)

## 2018-08-02 LAB — MAGNESIUM: Magnesium: 1.8 mg/dL (ref 1.7–2.4)

## 2018-08-02 MED ORDER — FAMOTIDINE IN NACL 20-0.9 MG/50ML-% IV SOLN
20.0000 mg | Freq: Once | INTRAVENOUS | Status: AC
  Administered 2018-08-02: 11:00:00 20 mg via INTRAVENOUS
  Filled 2018-08-02: qty 50

## 2018-08-02 MED ORDER — HEPARIN SOD (PORK) LOCK FLUSH 100 UNIT/ML IV SOLN
500.0000 [IU] | Freq: Once | INTRAVENOUS | Status: AC | PRN
  Administered 2018-08-02: 13:00:00 500 [IU]
  Filled 2018-08-02: qty 5

## 2018-08-02 MED ORDER — DIPHENHYDRAMINE HCL 50 MG/ML IJ SOLN
50.0000 mg | Freq: Once | INTRAMUSCULAR | Status: AC
  Administered 2018-08-02: 11:00:00 50 mg via INTRAVENOUS
  Filled 2018-08-02: qty 1

## 2018-08-02 MED ORDER — SODIUM CHLORIDE 0.9 % IV SOLN
80.0000 mg/m2 | Freq: Once | INTRAVENOUS | Status: AC
  Administered 2018-08-02: 12:00:00 180 mg via INTRAVENOUS
  Filled 2018-08-02: qty 30

## 2018-08-02 MED ORDER — SODIUM CHLORIDE 0.9 % IV SOLN
20.0000 mg | Freq: Once | INTRAVENOUS | Status: AC
  Administered 2018-08-02: 20 mg via INTRAVENOUS
  Filled 2018-08-02: qty 2

## 2018-08-02 MED ORDER — SODIUM CHLORIDE 0.9 % IV SOLN
Freq: Once | INTRAVENOUS | Status: AC
  Administered 2018-08-02: 11:00:00 via INTRAVENOUS
  Filled 2018-08-02: qty 250

## 2018-08-02 NOTE — Progress Notes (Signed)
Patient here today for follow up and chemotherapy.  Patient states no new concerns today  

## 2018-08-02 NOTE — Progress Notes (Signed)
Hematology/Oncology progress note [facility] Telephone:([phone] [phone]   Patient Care Team: Idelle Crouch, MD as PCP - General (Internal Medicine) Bary Castilla Forest Gleason, MD as Consulting Physician (General Surgery)  REFERRING PROVIDER: Idelle Crouch, MD REASON FOR VISIT:  Follow up for adjuvant chemotherapy for breast cancer.   HISTORY OF PRESENTING ILLNESS:  Megan Harris is a  68 y.o.  female present for management of  breast cancer  # Stage IB left triple negative breast cancer   03/27/2018 which showed left breast mass. She had diagnostic and ultrasound on 04/07/2018 which showed 0.9cm x 0.9cm x 1.4cm irregular border mixed echotexture mass at the left breast 11 o'clock 7 cm from nipple correlating to the mammographic finding. Ultrasound of the left axilla is negative She underwent biopsy of left breast mass. 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 Estrogen receptor negative, progesterone receptor negative, HER-2 negative.  S/p left lumpectomy and sentinel lymph node biopsy. pT1pN0 05/09/2018 Baseline MUGA showed no focal wall motion abnormality of the left ventricle.  Calculated LVEF 61%  # Adjuvant Chemotherapy ddAC-->T  INTERVAL HISTORY Megan Harris is a 69 y.o. 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 #Patient has finished 4 cycles of dd AC with Onpro G-CSF support. Received 2 cycle of Taxol treatments.  Today she reports feeling well at baseline.  Chronic pre-existing bilateral foot numbness and tingling, she uses ice pad applied to the bottom which she feels is helping with her symptoms.  Also takes gabapentin 300 mg 4 times daily. She has intermittent minimal tingling of her fingertips,  spontaneously resolved.  Nausea is controlled with antiemetics. Appetite is fair. Weight has been stable.  Gained 2 pounds since last visit. Thrush, symptoms are controlled with nystatin oral rinse.   Review of Systems  Constitutional: Positive for fatigue. Negative for appetite change, chills, fever and unexpected weight change.  HENT:   Negative for hearing loss and voice change.   Eyes: Negative for eye problems.  Respiratory: Negative for chest tightness and cough.   Cardiovascular: Negative for chest pain.  Gastrointestinal: Negative for abdominal distention, abdominal pain and blood in stool.  Endocrine: Negative for hot flashes.  Genitourinary: Negative for difficulty urinating and frequency.   Musculoskeletal: Negative for arthralgias.  Skin: Negative for itching and rash.  Neurological: Positive for numbness. Negative for extremity weakness.  Hematological: Negative for adenopathy.  Psychiatric/Behavioral: Negative for confusion. The patient is not nervous/anxious.     MEDICAL HISTORY:  Past Medical History:  Diagnosis Date  . Abdominal pain, left lower quadrant 2012  . Anemia    vitamin d deficiency  . Arthritis   . Asthma   . Back pain 2005   lower back arthritis  . Cancer ([place]) 04/26/2018   60m, T1c, N0 triple negative  . Chronic cholecystitis 2012   has had choleycystectomy.  not an issue  . Constipation 2005  . Endocrine problem 2005  . Family history of bone cancer   . Family history of brain cancer   . Family history of colon cancer   . Family history of lung cancer   . GERD (gastroesophageal reflux disease) 2012  . Hyperlipidemia 2012  . Hypertension 2002  . Nausea with vomiting 2012  . Obesity, unspecified 2012  . Pneumonia   .  PONV (postoperative nausea and vomiting)   . Special screening for malignant neoplasms, colon 2012    SURGICAL HISTORY: Past Surgical History:  Procedure Laterality Date  . BILATERAL CARPAL TUNNEL RELEASE Bilateral    . BREAST BIOPSY Left 04/13/2018   Pending Path  . BREAST EXCISIONAL BIOPSY Left early 90s   neg  . BREAST LUMPECTOMY Left 04/26/2018   Procedure: BREAST LUMPECTOMY WITH EXCISION OF SENTINEL NODE;  Surgeon: Robert Bellow, MD;  Location: [facility] ORS;  Service: General;  Laterality: Left;  . BUNIONECTOMY Bilateral 2003  . CHOLECYSTECTOMY  06/04/2010  . COLON RESECTION  2004   due diverticulitis   . COLONOSCOPY  2005   [place], Dr. Bary Castilla  . COLONOSCOPY  06/28/2012  . COLOSTOMY  2004  . COLOSTOMY REVERSAL  2004  . FOOT SURGERY Bilateral 2012   plantar faciatis  . HAND SURGERY Bilateral    carpal tunnel  . HERNIA REPAIR  2005   at colostomy site after reversal done  . JOINT REPLACEMENT    . KNEE ARTHROPLASTY Right 04/20/2017   Procedure: COMPUTER ASSISTED TOTAL KNEE ARTHROPLASTY;  Surgeon: Dereck Leep, MD;  Location: [facility] ORS;  Service: Orthopedics;  Laterality: Right;  . KNEE ARTHROSCOPY Right 02/03/2015   Procedure: ARTHROSCOPY right knee, partial medial menisectomy, condyle malleolus, patella and femoral;  Surgeon: Dereck Leep, MD;  Location: [facility] ORS;  Service: Orthopedics;  Laterality: Right;  . NASAL SINUS SURGERY     2005  . PORTACATH PLACEMENT Right 04/26/2018   Procedure: INSERTION PORT-A-CATH RIGHT;  Surgeon: Robert Bellow, MD;  Location: [facility] ORS;  Service: General;  Laterality: Right;  . SALPINGOOPHORECTOMY  1998  . TONSILLECTOMY    . TUBAL LIGATION  1978  . UPPER GI ENDOSCOPY  06/28/2012  . VAGINAL HYSTERECTOMY  1987    SOCIAL HISTORY: Social History   Socioeconomic History  . Marital status: Married    Spouse name: Francee Piccolo  . Number of children: 2  . Years of education: Not on file  . Highest education level: Not on file  Occupational History  . 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 Financial resource strain: Not on file  . Food insecurity    Worry: Not on file    Inability: Not on file  . Transportation needs     Medical: Not on file    Non-medical: Not on file  Tobacco Use  . Smoking status: Never Smoker  . Smokeless tobacco: Never Used  Substance and Sexual Activity  . Alcohol use: Not Currently  . Drug use: No  . Sexual activity: Yes    Birth control/protection: Surgical  Lifestyle  . Physical activity    Days per week: Not on file    Minutes per session: Not on file  . Stress: Not on file  Relationships  . Social Herbalist on phone: Not on file    Gets together: Not on file    Attends religious service: Not on file    Active member of club or organization: Not on file    Attends meetings of clubs or organizations: Not on file    Relationship status: Not on file  . Intimate partner violence    Fear of current or ex partner: Not on file    Emotionally abused: Not on file    Physically abused: Not on file    Forced sexual activity: Not on file  Other Topics Concern  .  Not on file  Social History Narrative  . Not on file   Lives at home with husband.  FAMILY HISTORY: Family History  Problem Relation Age of Onset  . Heart disease Father   . Diabetes Paternal Grandmother   . Diabetes Paternal Grandfather   . Brain cancer Mother 67  . Bone cancer Sister 83  . Colon cancer Maternal Uncle        dx 50s-60s  . Throat cancer Cousin   . Leukemia Cousin   . Lung cancer Cousin   . Ovarian cancer Neg Hx     ALLERGIES:  is allergic to codeine and penicillins.  MEDICATIONS:  Current Outpatient Medications  Medication Sig Dispense Refill  . acetaminophen (TYLENOL) 500 MG tablet Take 500 mg by mouth every 6 (six) hours as needed.    Marland Kitchen albuterol (PROVENTIL HFA;VENTOLIN HFA) 108 (90 Base) MCG/ACT inhaler Inhale 2 puffs into the lungs every 6 (six) hours as needed for wheezing or shortness of breath.     Marland Kitchen amLODipine (NORVASC) 5 MG tablet Take 5 mg by mouth daily.    Marland Kitchen atorvastatin (LIPITOR) 10 MG tablet Take 10 mg by mouth daily.    . Cholecalciferol (VITAMIN D3 MAXIMUM  STRENGTH) 5000 units capsule Take 5,000 Units by mouth daily.    Marland Kitchen gabapentin (NEURONTIN) 300 MG capsule Take 300 mg by mouth 4 (four) times daily.     . hydrochlorothiazide (HYDRODIURIL) 25 MG tablet Take 25 mg by mouth daily.    . lansoprazole (PREVACID) 30 MG capsule Take 30 mg by mouth 2 (two) times daily.     Marland Kitchen levothyroxine (SYNTHROID, LEVOTHROID) 50 MCG tablet Take 50 mcg by mouth daily before breakfast.     . lidocaine (XYLOCAINE) 5 % ointment Apply 1 application topically as needed. 35.44 g 0  . lidocaine-prilocaine (EMLA) cream Apply to affected area once 30 g 3  . losartan (COZAAR) 100 MG tablet Take 100 mg by mouth daily.    . magnesium chloride (SLOW-MAG) 64 MG TBEC SR tablet Take 1 tablet (64 mg total) by mouth daily. 30 tablet 0  . nystatin (MYCOSTATIN) 100000 UNIT/ML suspension Take 5 mLs (500,000 Units total) by mouth 4 (four) times daily. 473 mL 0  . ondansetron (ZOFRAN) 8 MG tablet Take 1 tablet (8 mg total) by mouth 2 (two) times daily as needed (Nausea or vomiting). 30 tablet 1  . potassium chloride SA (K-DUR) 20 MEQ tablet Take 2 tablets (40 mEq total) by mouth daily. 60 tablet 0  . promethazine (PHENERGAN) 25 MG tablet TAKE ONE TABLET BY MOUTH EVERY 6 HOURS AS NEEDED FOR NAUSEA / VOMITING 60 tablet 1  . valACYclovir (VALTREX) 1000 MG tablet Take 1 tablet (1,000 mg total) by mouth 2 (two) times daily. 60 tablet 0  . valACYclovir (VALTREX) 500 MG tablet Take 1 tablet (500 mg total) by mouth 2 (two) times daily. 70 tablet 0  . zolpidem (AMBIEN) 10 MG tablet Take 10 mg by mouth at bedtime.     . prochlorperazine (COMPAZINE) 10 MG tablet Take 1 tablet (10 mg total) by mouth every 6 (six) hours as needed (Nausea or vomiting). (Patient not taking: Reported on 08/02/2018) 30 tablet 1   No current facility-administered medications for this visit.    Facility-Administered Medications Ordered in Other Visits  Medication Dose Route Frequency Provider Last Rate Last Dose  .  dexamethasone (DECADRON) 20 mg in sodium chloride 0.9 % 50 mL IVPB  20 mg Intravenous Once Earlie Server, MD      .  famotidine (PEPCID) IVPB 20 mg premix  20 mg Intravenous Once Earlie Server, MD 100 mL/hr at 08/02/18 1110 20 mg at 08/02/18 1110  . heparin lock flush 100 unit/mL  500 Units Intracatheter Once PRN Earlie Server, MD      . PACLitaxel (TAXOL) 180 mg in sodium chloride 0.9 % 250 mL chemo infusion (</= 30m/m2)  80 mg/m2 (Treatment Plan Recorded) Intravenous Once YEarlie Server MD         PHYSICAL EXAMINATION: ECOG PERFORMANCE STATUS: 0 - Asymptomatic Vitals:   08/02/18 1004  BP: 124/81  Pulse: 83  Temp: (!) 97.1 F (36.2 C)   Filed Weights   08/02/18 1004  Weight: 241 lb (109.3 kg)    Physical Exam Constitutional:      General: She is not in acute distress.    Appearance: She is obese.  HENT:     Head: Normocephalic and atraumatic.     Mouth/Throat:     Comments: No thrush. Eyes:     General: No scleral icterus.    Pupils: Pupils are equal, round, and reactive to light.  Neck:     Musculoskeletal: Normal range of motion and neck supple.  Cardiovascular:     Rate and Rhythm: Normal rate and regular rhythm.     Heart sounds: Normal heart sounds.  Pulmonary:     Effort: Pulmonary effort is normal. No respiratory distress.     Breath sounds: No wheezing.  Abdominal:     General: Bowel sounds are normal. There is no distension.     Palpations: Abdomen is soft. There is no mass.     Tenderness: There is no abdominal tenderness.  Musculoskeletal: Normal range of motion.        General: No deformity.  Skin:    General: Skin is warm and dry.     Findings: No erythema or rash.  Neurological:     Mental Status: She is alert and oriented to person, place, and time.     Cranial Nerves: No cranial nerve deficit.     Coordination: Coordination normal.  Psychiatric:        Behavior: Behavior normal.        Thought Content: Thought content normal.       LABORATORY DATA:  I have  reviewed the data as listed Lab Results  Component Value Date   WBC 3.5 (L) 08/02/2018   HGB 10.6 (L) 08/02/2018   HCT 31.6 (L) 08/02/2018   MCV 91.1 08/02/2018   PLT 217 08/02/2018   Recent Labs    07/19/18 0845 07/26/18 0927 08/02/18 0930  NA 138 137 138  K 3.6 3.0* 3.4*  CL 100 101 102  CO2 _0 GLUCOSE 202* 199* 162*  BUN _1 CREATININE 0.87 1.01* 0.72  CALCIUM 9.2 8.8* 8.9  GFRNONAA >60 56* >60  GFRAA >60 >60 >60  PROT 6.6 6.5 6.5  ALBUMIN 3.8 3.6 3.5  AST 39 68* 64*  ALT 54* 68* 83*  ALKPHOS 86 63 54  BILITOT 0.4 0.7 0.7   Iron/TIBC/Ferritin/ %Sat No results found for: IRON, TIBC, FERRITIN, IRONPCTSAT   Pathology 04/26/2018 CASE: [id number]  PATIENT: GClovis Cao Surgical Pathology Report DIAGNOSIS:  A. BREAST, LEFT UPPER INNER QUADRANT; WIDE EXCISION:  - INVASIVE MAMMARY CARCINOMA.  - SEE CANCER SUMMARY BELOW.  - BIOPSY SITE CHANGE WITH HEMATOMA AND RIBBON CLIP.  - UNREMARKABLE SKIN.  B. SENTINEL LYMPH NODE #1; EXCISION:  - ONE LYMPH NODE NEGATIVE FOR MALIGNANCY (0/1).  C. NEW BREAST MARGIN, SUPERIOR; EXCISION:  - BENIGN BREAST TISSUE WITH BIOPSY CHANGE AND HEMATOMA.  - NEGATIVE FOR MALIGNANCY.  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, without comedonecrosis  Margins:    Invasive Carcinoma Margins: Uninvolved by invasive carcinoma            Distance from closest margin: 1.7 mm            Specify closest margin: Anterior       DCIS Margins: Uninvolved by  DCIS            Distance from closest margin: 1.2 mm            Specify closest margin: Anterior  Regional Lymph Nodes: Uninvolved by tumor cells    Total Number of Lymph Nodes Examined: 1       Number of Sentinel Nodes Examined: 1  Treatment Effect in the Breast: No known presurgical therapy  Lymphovascular Invasion: Not identified  Pathologic Stage Classification (pTNM, AJCC 8th Edition): pT1c pN0 (sn)   Breast Biomarker Testing Performed on Previous Biopsy: [id number]  Estrogen Receptor (ER) Status: NEGATIVE  Progesterone Receptor (PgR) Status: NEGATIVE  HER2 (by immunohistochemistry): NEGATIVE     ASSESSMENT & PLAN:  1. 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 ([place])   2. Hypokalemia   3. Hypomagnesemia   4. Herpes zoster without complication   5. Encounter for antineoplastic chemotherapy   6. Mucositis   7. Neuropathy   Cancer Staging 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 [facility]) Staging form: Breast, AJCC 8th Edition - Clinical stage from 04/28/2018: Stage IB (cT1c, cN0, cM0, G3, ER-, PR-, HER2-) - Signed by Earlie Server, MD on 04/29/2018 - Pathologic stage from 05/25/2018: Stage IB (pT1c, pN0(sn), cM0, G3, ER-, PR-, HER2-) - Signed by Earlie Server, MD on 05/25/2018  # Stage IB left triple negative breast cancer S/p left lumpectomy and sentinel lymph node biopsy.  Status post 2cycle of weekly Taxol.   Overall she tolerates well. Some exacerbation of pre-existing bilateral foot tingling and numbness.  Manageable symptoms. Continue use gabapentin 300 mg 4 times daily. Labs reviewed and discussed with patient. Counts acceptable to proceed with cycle 3 Taxol. Eventually she may need to have Taxol dose reduced.  Continue close monitoring neuropathy.  #Mild transaminitis, less than 2 fold of upper normal limits.  No dose reduction today. Close monitoring.  Repeat liver function at next visit.  #Thrush/mucositis, stable.  Continue nystatin oral  rinse as instructed. #Shingle, all lesions have crusted.  Healing. Continue Valtrex 500 mg twice daily for prophylaxis.  #Hyponatremia, potassium 3.4 today.  Continue oral potassium chloride 40 mEq daily. Encourage potassium rich food intake.  Discussed with patient.  #Hypo-magnesium, stable magnesium level at 1.8.  Continue  Slow-Mag 1 tablet daily.  Follow-up in 1 week  for evaluation prior to starting next cycle of  Taxol.  All questions were answered. The patient knows to call the clinic with  any problems questions or concerns.  Earlie Server, MD, PhD 08/02/2018

## 2018-08-08 ENCOUNTER — Encounter: Payer: Self-pay | Admitting: General Surgery

## 2018-08-09 ENCOUNTER — Inpatient Hospital Stay: Payer: PPO

## 2018-08-09 ENCOUNTER — Encounter: Payer: Self-pay | Admitting: Oncology

## 2018-08-09 ENCOUNTER — Inpatient Hospital Stay (HOSPITAL_BASED_OUTPATIENT_CLINIC_OR_DEPARTMENT_OTHER): Payer: PPO | Admitting: Oncology

## 2018-08-09 ENCOUNTER — Other Ambulatory Visit: Payer: Self-pay

## 2018-08-09 VITALS — BP 126/78 | HR 94 | Temp 97.1°F | Resp 18 | Ht 65.0 in | Wt 241.4 lb

## 2018-08-09 DIAGNOSIS — Z171 Estrogen receptor negative status [ER-]: Secondary | ICD-10-CM | POA: Diagnosis not present

## 2018-08-09 DIAGNOSIS — E876 Hypokalemia: Secondary | ICD-10-CM

## 2018-08-09 DIAGNOSIS — B370 Candidal stomatitis: Secondary | ICD-10-CM | POA: Diagnosis not present

## 2018-08-09 DIAGNOSIS — C50212 Malignant neoplasm of upper-inner quadrant of left female breast: Secondary | ICD-10-CM | POA: Diagnosis not present

## 2018-08-09 DIAGNOSIS — B029 Zoster without complications: Secondary | ICD-10-CM

## 2018-08-09 DIAGNOSIS — C50919 Malignant neoplasm of unspecified site of unspecified female breast: Secondary | ICD-10-CM

## 2018-08-09 DIAGNOSIS — Z5111 Encounter for antineoplastic chemotherapy: Secondary | ICD-10-CM

## 2018-08-09 DIAGNOSIS — G629 Polyneuropathy, unspecified: Secondary | ICD-10-CM | POA: Diagnosis not present

## 2018-08-09 DIAGNOSIS — K1230 Oral mucositis (ulcerative), unspecified: Secondary | ICD-10-CM

## 2018-08-09 DIAGNOSIS — R740 Nonspecific elevation of levels of transaminase and lactic acid dehydrogenase [LDH]: Secondary | ICD-10-CM | POA: Diagnosis not present

## 2018-08-09 LAB — CBC WITH DIFFERENTIAL/PLATELET
Abs Immature Granulocytes: 0.03 10*3/uL (ref 0.00–0.07)
Basophils Absolute: 0.1 10*3/uL (ref 0.0–0.1)
Basophils Relative: 2 %
Eosinophils Absolute: 0.2 10*3/uL (ref 0.0–0.5)
Eosinophils Relative: 6 %
HCT: 31.4 % — ABNORMAL LOW (ref 36.0–46.0)
Hemoglobin: 10.5 g/dL — ABNORMAL LOW (ref 12.0–15.0)
Immature Granulocytes: 1 %
Lymphocytes Relative: 21 %
Lymphs Abs: 0.7 10*3/uL (ref 0.7–4.0)
MCH: 31.1 pg (ref 26.0–34.0)
MCHC: 33.4 g/dL (ref 30.0–36.0)
MCV: 92.9 fL (ref 80.0–100.0)
Monocytes Absolute: 0.5 10*3/uL (ref 0.1–1.0)
Monocytes Relative: 13 %
Neutro Abs: 2 10*3/uL (ref 1.7–7.7)
Neutrophils Relative %: 57 %
Platelets: 179 10*3/uL (ref 150–400)
RBC: 3.38 MIL/uL — ABNORMAL LOW (ref 3.87–5.11)
RDW: 18.5 % — ABNORMAL HIGH (ref 11.5–15.5)
WBC: 3.5 10*3/uL — ABNORMAL LOW (ref 4.0–10.5)
nRBC: 0.6 % — ABNORMAL HIGH (ref 0.0–0.2)

## 2018-08-09 LAB — COMPREHENSIVE METABOLIC PANEL
ALT: 66 U/L — ABNORMAL HIGH (ref 0–44)
AST: 52 U/L — ABNORMAL HIGH (ref 15–41)
Albumin: 3.6 g/dL (ref 3.5–5.0)
Alkaline Phosphatase: 52 U/L (ref 38–126)
Anion gap: 11 (ref 5–15)
BUN: 15 mg/dL (ref 8–23)
CO2: 26 mmol/L (ref 22–32)
Calcium: 9.3 mg/dL (ref 8.9–10.3)
Chloride: 100 mmol/L (ref 98–111)
Creatinine, Ser: 0.78 mg/dL (ref 0.44–1.00)
GFR calc Af Amer: >60 mL/min (ref >60)
GFR calc non Af Amer: >60 mL/min (ref >60)
Glucose, Bld: 187 mg/dL — ABNORMAL HIGH (ref 70–99)
Potassium: 3.3 mmol/L — ABNORMAL LOW (ref 3.5–5.1)
Sodium: 137 mmol/L (ref 135–145)
Total Bilirubin: 0.9 mg/dL (ref 0.3–1.2)
Total Protein: 6.6 g/dL (ref 6.5–8.1)

## 2018-08-09 LAB — MAGNESIUM: Magnesium: 1.7 mg/dL (ref 1.7–2.4)

## 2018-08-09 MED ORDER — DIPHENHYDRAMINE HCL 50 MG/ML IJ SOLN
50.0000 mg | Freq: Once | INTRAMUSCULAR | Status: AC
  Administered 2018-08-09: 50 mg via INTRAVENOUS
  Filled 2018-08-09: qty 1

## 2018-08-09 MED ORDER — SODIUM CHLORIDE 0.9 % IV SOLN
70.0000 mg/m2 | Freq: Once | INTRAVENOUS | Status: AC
  Administered 2018-08-09: 156 mg via INTRAVENOUS
  Filled 2018-08-09: qty 26

## 2018-08-09 MED ORDER — SODIUM CHLORIDE 0.9 % IV SOLN
20.0000 mg | Freq: Once | INTRAVENOUS | Status: AC
  Administered 2018-08-09: 20 mg via INTRAVENOUS
  Filled 2018-08-09: qty 2

## 2018-08-09 MED ORDER — SODIUM CHLORIDE 0.9 % IV SOLN
Freq: Once | INTRAVENOUS | Status: AC
  Administered 2018-08-09: 10:00:00 via INTRAVENOUS
  Filled 2018-08-09: qty 1000

## 2018-08-09 MED ORDER — HEPARIN SOD (PORK) LOCK FLUSH 100 UNIT/ML IV SOLN
500.0000 [IU] | Freq: Once | INTRAVENOUS | Status: AC | PRN
  Administered 2018-08-09: 500 [IU]
  Filled 2018-08-09: qty 5

## 2018-08-09 MED ORDER — FAMOTIDINE IN NACL 20-0.9 MG/50ML-% IV SOLN
20.0000 mg | Freq: Once | INTRAVENOUS | Status: AC
  Administered 2018-08-09: 20 mg via INTRAVENOUS
  Filled 2018-08-09: qty 50

## 2018-08-09 MED ORDER — SODIUM CHLORIDE 0.9 % IV SOLN
Freq: Once | INTRAVENOUS | Status: AC
  Administered 2018-08-09: 10:00:00 via INTRAVENOUS
  Filled 2018-08-09: qty 250

## 2018-08-09 NOTE — Progress Notes (Signed)
Pt states she has a little neuropathy of fingers and toes.

## 2018-08-09 NOTE — Progress Notes (Signed)
Hematology/Oncology progress note [facility] Telephone:([phone] [phone]   Patient Care Team: Idelle Crouch, MD as PCP - General (Internal Medicine) Bary Castilla Forest Gleason, MD as Consulting Physician (General Surgery)  REFERRING PROVIDER: Idelle Crouch, MD REASON FOR VISIT:  Follow up for adjuvant chemotherapy for breast cancer.   HISTORY OF PRESENTING ILLNESS:  Megan Harris is a  68 y.o.  female present for management of  breast cancer  # Stage IB left triple negative breast cancer   03/27/2018 which showed left breast mass. She had diagnostic and ultrasound on 04/07/2018 which showed 0.9cm x 0.9cm x 1.4cm irregular border mixed echotexture mass at the left breast 11 o'clock 7 cm from nipple correlating to the mammographic finding. Ultrasound of the left axilla is negative She underwent biopsy of left breast mass. 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 Estrogen receptor negative, progesterone receptor negative, HER-2 negative.  S/p left lumpectomy and sentinel lymph node biopsy. pT1pN0 05/09/2018 Baseline MUGA showed no focal wall motion abnormality of the left ventricle.  Calculated LVEF 61%  # Adjuvant Chemotherapy ddAC-->T  INTERVAL HISTORY Megan Harris is a 68 y.o. female who has above history reviewed by me today presents for follow up visit for management of Stage IB Triple negative breast cancer, evaluation prior to chemotherapy. #Patient has finished 4 cycles of dd AC with Onpro G-CSF support. Received 3 cycle of Taxol treatments.   Today she reports feeling well.  Continue to have Bilateral lower extremity pre-existing foot numbness and tingling.  She uses ice pad.  Bilateral fingertip constant pain when touch solid surface.  Mild numbness and tingling. Nausea is controlled with antiemetics. Appetite is fair .  She uses oral  nystatin rinse. Weight has been stable.    Review of Systems  Constitutional: Positive for fatigue. Negative for appetite change, chills, fever and unexpected weight change.  HENT:   Negative for hearing loss and voice change.   Eyes: Negative for eye problems.  Respiratory: Negative for chest tightness and cough.   Cardiovascular: Negative for chest pain.  Gastrointestinal: Negative for abdominal distention, abdominal pain and blood in stool.  Endocrine: Negative for hot flashes.  Genitourinary: Negative for difficulty urinating and frequency.   Musculoskeletal: Negative for arthralgias.  Skin: Negative for itching and rash.  Neurological: Positive for numbness. Negative for extremity weakness.  Hematological: Negative for adenopathy.  Psychiatric/Behavioral: Negative for confusion. The patient is not nervous/anxious.     MEDICAL HISTORY:  Past Medical History:  Diagnosis Date  . Abdominal pain, left lower quadrant 2012  . Anemia    vitamin d deficiency  . Arthritis   . Asthma   . Back pain 2005   lower back arthritis  . Bone cancer ([place])   . Cancer ([place]) 04/26/2018   25m, T1c, N0 triple negative  . Chronic cholecystitis 2012   has had choleycystectomy.  not an issue  . Constipation 2005  . Endocrine problem 2005  . Family history of bone cancer   . Family history of brain cancer   . Family history of colon cancer   . Family history of lung cancer   . GERD (gastroesophageal reflux disease) 2012  . Hyperlipidemia 2012  . Hypertension 2002  . Nausea with vomiting 2012  . Obesity, unspecified 2012  . Pneumonia   . PONV (postoperative nausea and vomiting)   . Special screening for malignant neoplasms, colon 2012  SURGICAL HISTORY: Past Surgical History:  Procedure Laterality Date  . BILATERAL CARPAL TUNNEL RELEASE Bilateral   . BREAST BIOPSY Left 04/13/2018   Pending Path  . BREAST EXCISIONAL BIOPSY Left early 90s   neg  . BREAST LUMPECTOMY Left 04/26/2018    Procedure: BREAST LUMPECTOMY WITH EXCISION OF SENTINEL NODE;  Surgeon: Robert Bellow, MD;  Location: [facility] ORS;  Service: General;  Laterality: Left;  . BUNIONECTOMY Bilateral 2003  . CHOLECYSTECTOMY  06/04/2010  . COLON RESECTION  2004   due diverticulitis   . COLONOSCOPY  2005   [place], Dr. Bary Castilla  . COLONOSCOPY  06/28/2012  . COLOSTOMY  2004  . COLOSTOMY REVERSAL  2004  . FOOT SURGERY Bilateral 2012   plantar faciatis  . HAND SURGERY Bilateral    carpal tunnel  . HERNIA REPAIR  2005   at colostomy site after reversal done  . JOINT REPLACEMENT    . KNEE ARTHROPLASTY Right 04/20/2017   Procedure: COMPUTER ASSISTED TOTAL KNEE ARTHROPLASTY;  Surgeon: Dereck Leep, MD;  Location: [facility] ORS;  Service: Orthopedics;  Laterality: Right;  . KNEE ARTHROSCOPY Right 02/03/2015   Procedure: ARTHROSCOPY right knee, partial medial menisectomy, condyle malleolus, patella and femoral;  Surgeon: Dereck Leep, MD;  Location: [facility] ORS;  Service: Orthopedics;  Laterality: Right;  . NASAL SINUS SURGERY     2005  . PORTACATH PLACEMENT Right 04/26/2018   Procedure: INSERTION PORT-A-CATH RIGHT;  Surgeon: Robert Bellow, MD;  Location: [facility] ORS;  Service: General;  Laterality: Right;  . SALPINGOOPHORECTOMY  1998  . TONSILLECTOMY    . TUBAL LIGATION  1978  . UPPER GI ENDOSCOPY  06/28/2012  . VAGINAL HYSTERECTOMY  1987    SOCIAL HISTORY: Social History   Socioeconomic History  . Marital status: Married    Spouse name: Francee Piccolo  . Number of children: 2  . Years of education: Not on file  . Highest education level: Not on file  Occupational History  . 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 Financial resource strain: Not on file  . Food insecurity    Worry: Not on file    Inability: Not on file  . Transportation needs    Medical: Not on file    Non-medical: Not on file  Tobacco Use  . Smoking status: Never Smoker  . Smokeless tobacco: Never Used   Substance and Sexual Activity  . Alcohol use: Not Currently  . Drug use: No  . Sexual activity: Yes    Birth control/protection: Surgical  Lifestyle  . Physical activity    Days per week: Not on file    Minutes per session: Not on file  . Stress: Not on file  Relationships  . Social Herbalist on phone: Not on file    Gets together: Not on file    Attends religious service: Not on file    Active member of club or organization: Not on file    Attends meetings of clubs or organizations: Not on file    Relationship status: Not on file  . Intimate partner violence    Fear of current or ex partner: Not on file    Emotionally abused: Not on file    Physically abused: Not on file    Forced sexual activity: Not on file  Other Topics Concern  . Not on file  Social History Narrative  . Not on file   Lives at  home with husband.  FAMILY HISTORY: Family History  Problem Relation Age of Onset  . Heart disease Father   . Diabetes Paternal Grandmother   . Diabetes Paternal Grandfather   . Brain cancer Mother 27  . Bone cancer Sister 6  . Colon cancer Maternal Uncle        dx 50s-60s  . Throat cancer Cousin   . Leukemia Cousin   . Lung cancer Cousin   . Ovarian cancer Neg Hx     ALLERGIES:  is allergic to codeine and penicillins.  MEDICATIONS:  Current Outpatient Medications  Medication Sig Dispense Refill  . acetaminophen (TYLENOL) 500 MG tablet Take 500 mg by mouth every 6 (six) hours as needed.    Marland Kitchen albuterol (PROVENTIL HFA;VENTOLIN HFA) 108 (90 Base) MCG/ACT inhaler Inhale 2 puffs into the lungs every 6 (six) hours as needed for wheezing or shortness of breath.     Marland Kitchen amLODipine (NORVASC) 5 MG tablet Take 5 mg by mouth daily.    Marland Kitchen atorvastatin (LIPITOR) 10 MG tablet Take 10 mg by mouth daily.    . Cholecalciferol (VITAMIN D3 MAXIMUM STRENGTH) 5000 units capsule Take 5,000 Units by mouth daily.    Marland Kitchen gabapentin (NEURONTIN) 300 MG capsule Take 300 mg by mouth 4  (four) times daily.     . hydrochlorothiazide (HYDRODIURIL) 25 MG tablet Take 25 mg by mouth daily.    . lansoprazole (PREVACID) 30 MG capsule Take 30 mg by mouth 2 (two) times daily.     Marland Kitchen levothyroxine (SYNTHROID, LEVOTHROID) 50 MCG tablet Take 50 mcg by mouth daily before breakfast.     . lidocaine-prilocaine (EMLA) cream Apply to affected area once 30 g 3  . losartan (COZAAR) 100 MG tablet Take 100 mg by mouth daily.    . magnesium chloride (SLOW-MAG) 64 MG TBEC SR tablet Take 1 tablet (64 mg total) by mouth daily. 30 tablet 0  . nystatin (MYCOSTATIN) 100000 UNIT/ML suspension Take 5 mLs (500,000 Units total) by mouth 4 (four) times daily. 473 mL 0  . potassium chloride SA (K-DUR) 20 MEQ tablet Take 2 tablets (40 mEq total) by mouth daily. 60 tablet 0  . promethazine (PHENERGAN) 25 MG tablet TAKE ONE TABLET BY MOUTH EVERY 6 HOURS AS NEEDED FOR NAUSEA / VOMITING 60 tablet 1  . valACYclovir (VALTREX) 500 MG tablet Take 1 tablet (500 mg total) by mouth 2 (two) times daily. 70 tablet 0  . zolpidem (AMBIEN) 10 MG tablet Take 10 mg by mouth at bedtime.      No current facility-administered medications for this visit.      PHYSICAL EXAMINATION: ECOG PERFORMANCE STATUS: 0 - Asymptomatic Vitals:   08/09/18 0902  BP: 126/78  Pulse: 94  Resp: 18  Temp: (!) 97.1 F (36.2 C)   Filed Weights   08/09/18 0902  Weight: 241 lb 6.4 oz (109.5 kg)    Physical Exam Constitutional:      General: She is not in acute distress.    Appearance: She is obese.  HENT:     Head: Normocephalic and atraumatic.     Mouth/Throat:     Comments: No thrush. Eyes:     General: No scleral icterus.    Pupils: Pupils are equal, round, and reactive to light.  Neck:     Musculoskeletal: Normal range of motion and neck supple.  Cardiovascular:     Rate and Rhythm: Normal rate and regular rhythm.     Heart sounds: Normal heart sounds.  Pulmonary:  Effort: Pulmonary effort is normal. No respiratory distress.      Breath sounds: No wheezing.  Abdominal:     General: Bowel sounds are normal. There is no distension.     Palpations: Abdomen is soft. There is no mass.     Tenderness: There is no abdominal tenderness.  Musculoskeletal: Normal range of motion.        General: No deformity.  Skin:    General: Skin is warm and dry.     Findings: No erythema or rash.  Neurological:     Mental Status: She is alert and oriented to person, place, and time.     Cranial Nerves: No cranial nerve deficit.     Coordination: Coordination normal.  Psychiatric:        Behavior: Behavior normal.        Thought Content: Thought content normal.       LABORATORY DATA:  I have reviewed the data as listed Lab Results  Component Value Date   WBC 3.5 (L) 08/09/2018   HGB 10.5 (L) 08/09/2018   HCT 31.4 (L) 08/09/2018   MCV 92.9 08/09/2018   PLT 179 08/09/2018   Recent Labs    07/26/18 0927 08/02/18 0930 08/09/18 0825  NA 137 138 137  K 3.0* 3.4* 3.3*  CL 101 102 100  CO2 23 26 26   GLUCOSE 199* 162* 187*  BUN 18 12 15   CREATININE 1.01* 0.72 0.78  CALCIUM 8.8* 8.9 9.3  GFRNONAA 56* >60 >60  GFRAA >60 >60 >60  PROT 6.5 6.5 6.6  ALBUMIN 3.6 3.5 3.6  AST 68* 64* 52*  ALT 68* 83* 66*  ALKPHOS 63 54 52  BILITOT 0.7 0.7 0.9   Iron/TIBC/Ferritin/ %Sat No results found for: IRON, TIBC, FERRITIN, IRONPCTSAT   Pathology 04/26/2018 CASE: [id number]  PATIENT: Clovis Cao  Surgical Pathology Report DIAGNOSIS:  A. BREAST, LEFT UPPER INNER QUADRANT; WIDE EXCISION:  - INVASIVE MAMMARY CARCINOMA.  - SEE CANCER SUMMARY BELOW.  - BIOPSY SITE CHANGE WITH HEMATOMA AND RIBBON CLIP.  - UNREMARKABLE SKIN.  B. SENTINEL LYMPH NODE #1; EXCISION:  - ONE LYMPH NODE NEGATIVE FOR MALIGNANCY (0/1).  C. NEW BREAST MARGIN, SUPERIOR; EXCISION:  - BENIGN BREAST TISSUE WITH BIOPSY CHANGE AND HEMATOMA.  - NEGATIVE FOR MALIGNANCY.  CANCER CASE SUMMARY: INVASIVE CARCINOMA OF THE BREAST  Procedure: Wide excision   Specimen Laterality: Left  Tumor Size: 16 mm  Histologic Type: Invasive carcinoma of no special type (ductal)  Histologic Grade (Nottingham Histologic Score)            Glandular (Acinar)/Tubular Differentiation: 3            Nuclear Pleomorphism: 2            Mitotic Rate: 3            Overall Grade: 3  Ductal Carcinoma In Situ (DCIS): Present, nuclear grade 2-3, without comedonecrosis  Margins:    Invasive Carcinoma Margins: Uninvolved by invasive carcinoma            Distance from closest margin: 1.7 mm            Specify closest margin: Anterior       DCIS Margins: Uninvolved by DCIS            Distance from closest margin: 1.2 mm            Specify closest margin: Anterior  Regional Lymph Nodes: Uninvolved by tumor cells    Total Number of  Lymph Nodes Examined: 1       Number of Sentinel Nodes Examined: 1  Treatment Effect in the Breast: No known presurgical therapy  Lymphovascular Invasion: Not identified  Pathologic Stage Classification (pTNM, AJCC 8th Edition): pT1c pN0 (sn)   Breast Biomarker Testing Performed on Previous Biopsy: [id number]  Estrogen Receptor (ER) Status: NEGATIVE  Progesterone Receptor (PgR) Status: NEGATIVE  HER2 (by immunohistochemistry): NEGATIVE     ASSESSMENT & PLAN:  1. 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 ([place])   2. Encounter for antineoplastic chemotherapy   3. Herpes zoster without complication   Cancer Staging 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 [facility]) Staging form: Breast, AJCC 8th Edition - Clinical stage from 04/28/2018: Stage IB (cT1c, cN0, cM0, G3, ER-, PR-, HER2-) - Signed by Earlie Server, MD on 04/29/2018 - Pathologic stage from 05/25/2018: Stage IB (pT1c, pN0(sn), cM0, G3, ER-, PR-, HER2-) - Signed by Earlie Server, MD on 05/25/2018  # Stage IB left triple negative breast cancer S/p left lumpectomy and sentinel lymph node  biopsy.  Status post 3 cycle of weekly Taxol.   Tolerates well with mild faculties She has had exacerbation of pre-existing bilateral foot tingling and numbness.  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 I will decrease Taxol from 80 mg/m to 70 mg/m. Continue use gabapentin 300 mg 4 times a day. Labs reviewed and discussed with patient. Counts acceptable to proceed with cycle 4 Taxol. Continue close monitoring neuropathy  Mild transaminitis, slightly better compared to 1 week ago.   Close monitoring.  #Stable symptoms.  Continue nystatin oral rinse as instructed. #Recent history of shingle, continue Valtrex 500 mg twice daily for prophylaxis.  #Hyponatremia, potassium 3.3 today.  Likely secondary to electrolyte wasting  while on Taxol treatment.   She will receive 20 mEq potassium chloride along with her  treatments today.  Recommend patient to continue 40 mEq potassium chloride oral daily. #Hypo-magnesium, stable magnesium level at 1.7.  Continue Slow-Mag 1 tablet daily.  Follow-up in 1 week  for evaluation prior to starting next cycle of  Taxol.  All questions were answered. The patient knows to call the clinic with any problems questions or concerns.  Earlie Server, MD, PhD 08/09/2018

## 2018-08-16 ENCOUNTER — Encounter: Payer: Self-pay | Admitting: Oncology

## 2018-08-16 ENCOUNTER — Other Ambulatory Visit: Payer: Self-pay

## 2018-08-16 ENCOUNTER — Other Ambulatory Visit: Payer: Self-pay | Admitting: *Deleted

## 2018-08-16 ENCOUNTER — Other Ambulatory Visit: Payer: Self-pay | Admitting: Oncology

## 2018-08-16 ENCOUNTER — Inpatient Hospital Stay: Payer: PPO

## 2018-08-16 ENCOUNTER — Inpatient Hospital Stay (HOSPITAL_BASED_OUTPATIENT_CLINIC_OR_DEPARTMENT_OTHER): Payer: PPO | Admitting: Oncology

## 2018-08-16 VITALS — BP 120/82 | HR 86 | Resp 20

## 2018-08-16 VITALS — BP 170/107 | HR 80 | Temp 96.6°F | Resp 18 | Wt 239.6 lb

## 2018-08-16 DIAGNOSIS — B029 Zoster without complications: Secondary | ICD-10-CM

## 2018-08-16 DIAGNOSIS — Z5111 Encounter for antineoplastic chemotherapy: Secondary | ICD-10-CM

## 2018-08-16 DIAGNOSIS — Z171 Estrogen receptor negative status [ER-]: Secondary | ICD-10-CM

## 2018-08-16 DIAGNOSIS — C50919 Malignant neoplasm of unspecified site of unspecified female breast: Secondary | ICD-10-CM

## 2018-08-16 DIAGNOSIS — C50212 Malignant neoplasm of upper-inner quadrant of left female breast: Secondary | ICD-10-CM

## 2018-08-16 DIAGNOSIS — E871 Hypo-osmolality and hyponatremia: Secondary | ICD-10-CM

## 2018-08-16 DIAGNOSIS — G629 Polyneuropathy, unspecified: Secondary | ICD-10-CM

## 2018-08-16 DIAGNOSIS — R740 Nonspecific elevation of levels of transaminase and lactic acid dehydrogenase [LDH]: Secondary | ICD-10-CM | POA: Diagnosis not present

## 2018-08-16 DIAGNOSIS — R5383 Other fatigue: Secondary | ICD-10-CM | POA: Diagnosis not present

## 2018-08-16 DIAGNOSIS — E876 Hypokalemia: Secondary | ICD-10-CM

## 2018-08-16 DIAGNOSIS — B370 Candidal stomatitis: Secondary | ICD-10-CM

## 2018-08-16 LAB — CBC WITH DIFFERENTIAL/PLATELET
Abs Immature Granulocytes: 0.03 10*3/uL (ref 0.00–0.07)
Basophils Absolute: 0 10*3/uL (ref 0.0–0.1)
Basophils Relative: 1 %
Eosinophils Absolute: 0.1 10*3/uL (ref 0.0–0.5)
Eosinophils Relative: 3 %
HCT: 32.3 % — ABNORMAL LOW (ref 36.0–46.0)
Hemoglobin: 10.9 g/dL — ABNORMAL LOW (ref 12.0–15.0)
Immature Granulocytes: 1 %
Lymphocytes Relative: 18 %
Lymphs Abs: 0.7 10*3/uL (ref 0.7–4.0)
MCH: 31.6 pg (ref 26.0–34.0)
MCHC: 33.7 g/dL (ref 30.0–36.0)
MCV: 93.6 fL (ref 80.0–100.0)
Monocytes Absolute: 0.3 10*3/uL (ref 0.1–1.0)
Monocytes Relative: 7 %
Neutro Abs: 2.9 10*3/uL (ref 1.7–7.7)
Neutrophils Relative %: 70 %
Platelets: 209 10*3/uL (ref 150–400)
RBC: 3.45 MIL/uL — ABNORMAL LOW (ref 3.87–5.11)
RDW: 18.2 % — ABNORMAL HIGH (ref 11.5–15.5)
WBC: 4.1 10*3/uL (ref 4.0–10.5)
nRBC: 0.5 % — ABNORMAL HIGH (ref 0.0–0.2)

## 2018-08-16 LAB — COMPREHENSIVE METABOLIC PANEL
ALT: 71 U/L — ABNORMAL HIGH (ref 0–44)
AST: 66 U/L — ABNORMAL HIGH (ref 15–41)
Albumin: 3.8 g/dL (ref 3.5–5.0)
Alkaline Phosphatase: 52 U/L (ref 38–126)
Anion gap: 7 (ref 5–15)
BUN: 12 mg/dL (ref 8–23)
CO2: 25 mmol/L (ref 22–32)
Calcium: 8.8 mg/dL — ABNORMAL LOW (ref 8.9–10.3)
Chloride: 105 mmol/L (ref 98–111)
Creatinine, Ser: 0.69 mg/dL (ref 0.44–1.00)
GFR calc Af Amer: >60 mL/min (ref >60)
GFR calc non Af Amer: >60 mL/min (ref >60)
Glucose, Bld: 187 mg/dL — ABNORMAL HIGH (ref 70–99)
Potassium: 3.3 mmol/L — ABNORMAL LOW (ref 3.5–5.1)
Sodium: 137 mmol/L (ref 135–145)
Total Bilirubin: 0.9 mg/dL (ref 0.3–1.2)
Total Protein: 6.7 g/dL (ref 6.5–8.1)

## 2018-08-16 LAB — MAGNESIUM: Magnesium: 1.7 mg/dL (ref 1.7–2.4)

## 2018-08-16 MED ORDER — HEPARIN SOD (PORK) LOCK FLUSH 100 UNIT/ML IV SOLN
500.0000 [IU] | Freq: Once | INTRAVENOUS | Status: AC
  Administered 2018-08-16: 13:00:00 500 [IU] via INTRAVENOUS
  Filled 2018-08-16: qty 5

## 2018-08-16 MED ORDER — FAMOTIDINE IN NACL 20-0.9 MG/50ML-% IV SOLN
20.0000 mg | Freq: Once | INTRAVENOUS | Status: AC
  Administered 2018-08-16: 11:00:00 20 mg via INTRAVENOUS
  Filled 2018-08-16: qty 50

## 2018-08-16 MED ORDER — DIPHENHYDRAMINE HCL 50 MG/ML IJ SOLN
50.0000 mg | Freq: Once | INTRAMUSCULAR | Status: AC
  Administered 2018-08-16: 50 mg via INTRAVENOUS
  Filled 2018-08-16: qty 1

## 2018-08-16 MED ORDER — SODIUM CHLORIDE 0.9% FLUSH
10.0000 mL | INTRAVENOUS | Status: DC | PRN
  Administered 2018-08-16: 09:00:00 10 mL via INTRAVENOUS
  Filled 2018-08-16: qty 10

## 2018-08-16 MED ORDER — SODIUM CHLORIDE 0.9 % IV SOLN
Freq: Once | INTRAVENOUS | Status: AC
  Administered 2018-08-16: 10:00:00 via INTRAVENOUS
  Filled 2018-08-16: qty 250

## 2018-08-16 MED ORDER — SODIUM CHLORIDE 0.9 % IV SOLN
70.0000 mg/m2 | Freq: Once | INTRAVENOUS | Status: AC
  Administered 2018-08-16: 12:00:00 156 mg via INTRAVENOUS
  Filled 2018-08-16: qty 26

## 2018-08-16 MED ORDER — SODIUM CHLORIDE 0.9 % IV SOLN
20.0000 mg | Freq: Once | INTRAVENOUS | Status: AC
  Administered 2018-08-16: 11:00:00 20 mg via INTRAVENOUS
  Filled 2018-08-16: qty 2

## 2018-08-16 MED ORDER — NYSTATIN 100000 UNIT/ML MT SUSP: OROMUCOSAL | 0 refills | Status: DC

## 2018-08-16 NOTE — Telephone Encounter (Signed)
Received msg on triage. Patient is out of nystatin suspension and is needing this RF today. [facility].

## 2018-08-16 NOTE — Progress Notes (Signed)
Pt in for follow up reports "worn out and weak" and "I still have thrush".   BP checked in left arm manually per pt request 160/110.

## 2018-08-16 NOTE — Progress Notes (Signed)
Hematology/Oncology progress note [facility] Telephone:([phone] [phone]   Patient Care Team: Idelle Crouch, MD as PCP - General (Internal Medicine) Bary Castilla Forest Gleason, MD as Consulting Physician (General Surgery)  REFERRING PROVIDER: Idelle Crouch, MD REASON FOR VISIT:  Follow up for adjuvant chemotherapy for breast cancer.   HISTORY OF PRESENTING ILLNESS:  Megan Harris is a  68 y.o.  female present for management of  breast cancer  # Stage IB left triple negative breast cancer   03/27/2018 which showed left breast mass. She had diagnostic and ultrasound on 04/07/2018 which showed 0.9cm x 0.9cm x 1.4cm irregular border mixed echotexture mass at the left breast 11 o'clock 7 cm from nipple correlating to the mammographic finding. Ultrasound of the left axilla is negative She underwent biopsy of left breast mass. 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 Estrogen receptor negative, progesterone receptor negative, HER-2 negative.  S/p left lumpectomy and sentinel lymph node biopsy. pT1pN0 05/09/2018 Baseline MUGA showed no focal wall motion abnormality of the left ventricle.  Calculated LVEF 61%  # 05/24/2018- 07/05/2018 Adjuvant Chemotherapy ddAC- # 07/19/2018 started on weekly Taxol 80 mg/m. Starting cycle 4, Taxol was decreased to 7m/m2  INTERVAL HISTORY Megan SALAZARis a 68y.o. female who has above history reviewed by me today presents for follow up visit for management of Stage IB Triple negative breast cancer, evaluation prior to chemotherapy. #Patient has finished 4 cycles of dd AC with Onpro G-CSF support. Patient is currently on weekly Taxol treatment. Her daughter JAnderson Maltawas on the phone during this clinical encounter. Reports feeling fatigued sometimes. "worn and weak" from time to time. Bilateral lower extremity pre-existing  foot numbness and tingling, stable.  She uses ice pad with some symptom relief. Bilateral fingertips pain/tingling, stable.  Not worsened. Nausea is controlled with antiemetics. Appetite is fair. Continue use nystatin oral rinse.  Still has a thin whitish plaque on her tongue.    Review of Systems  Constitutional: Positive for fatigue. Negative for appetite change, chills, fever and unexpected weight change.  HENT:   Negative for hearing loss and voice change.   Eyes: Negative for eye problems.  Respiratory: Negative for chest tightness and cough.   Cardiovascular: Negative for chest pain.  Gastrointestinal: Negative for abdominal distention, abdominal pain and blood in stool.  Endocrine: Negative for hot flashes.  Genitourinary: Negative for difficulty urinating and frequency.   Musculoskeletal: Negative for arthralgias.  Skin: Negative for itching and rash.  Neurological: Positive for numbness. Negative for extremity weakness.  Hematological: Negative for adenopathy.  Psychiatric/Behavioral: Negative for confusion. The patient is not nervous/anxious.     MEDICAL HISTORY:  Past Medical History:  Diagnosis Date  . Abdominal pain, left lower quadrant 2012  . Anemia    vitamin d deficiency  . Arthritis   . Asthma   . Back pain 2005   lower back arthritis  [place]   [place]   110m T1c, N0 triple negative  . Chronic cholecystitis 2012   has had choleycystectomy.  not an issue  . Constipation 2005  . Endocrine problem 2005  . Family history of bone cancer   . Family history of brain cancer   . Family history of colon cancer   . Family history of lung cancer   . GERD (gastroesophageal reflux disease) 2012  . Hyperlipidemia 2012  . Hypertension 2002  .  Nausea with vomiting 2012  . Obesity, unspecified 2012  . Pneumonia   . PONV (postoperative nausea and vomiting)   . Special screening for malignant neoplasms, colon 2012    SURGICAL  HISTORY: Past Surgical History:  Procedure Laterality Date  . BILATERAL CARPAL TUNNEL RELEASE Bilateral   . BREAST BIOPSY Left 04/13/2018   Pending Path  . BREAST EXCISIONAL BIOPSY Left early 90s   neg  . BREAST LUMPECTOMY Left 04/26/2018   Procedure: BREAST LUMPECTOMY WITH EXCISION OF SENTINEL NODE;  Surgeon: Robert Bellow, MD;  Location: [facility] ORS;  Service: General;  Laterality: Left;  . BUNIONECTOMY Bilateral 2003  . CHOLECYSTECTOMY  06/04/2010  . COLON RESECTION  2004   due diverticulitis   . COLONOSCOPY  2005   [place], Dr. Bary Castilla  . COLONOSCOPY  06/28/2012  . COLOSTOMY  2004  . COLOSTOMY REVERSAL  2004  . FOOT SURGERY Bilateral 2012   plantar faciatis  . HAND SURGERY Bilateral    carpal tunnel  . HERNIA REPAIR  2005   at colostomy site after reversal done  . JOINT REPLACEMENT    . KNEE ARTHROPLASTY Right 04/20/2017   Procedure: COMPUTER ASSISTED TOTAL KNEE ARTHROPLASTY;  Surgeon: Dereck Leep, MD;  Location: [facility] ORS;  Service: Orthopedics;  Laterality: Right;  . KNEE ARTHROSCOPY Right 02/03/2015   Procedure: ARTHROSCOPY right knee, partial medial menisectomy, condyle malleolus, patella and femoral;  Surgeon: Dereck Leep, MD;  Location: [facility] ORS;  Service: Orthopedics;  Laterality: Right;  . NASAL SINUS SURGERY     2005  . PORTACATH PLACEMENT Right 04/26/2018   Procedure: INSERTION PORT-A-CATH RIGHT;  Surgeon: Robert Bellow, MD;  Location: [facility] ORS;  Service: General;  Laterality: Right;  . SALPINGOOPHORECTOMY  1998  . TONSILLECTOMY    . TUBAL LIGATION  1978  . UPPER GI ENDOSCOPY  06/28/2012  . VAGINAL HYSTERECTOMY  1987    SOCIAL HISTORY: Social History   Socioeconomic History  . Marital status: Married    Spouse name: Francee Piccolo  . Number of children: 2  . Years of education: Not on file  . Highest education level: Not on file  Occupational History  . 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 Financial resource strain: Not  on file  . Food insecurity    Worry: Not on file    Inability: Not on file  . Transportation needs    Medical: Not on file    Non-medical: Not on file  Tobacco Use  . Smoking status: Never Smoker  . Smokeless tobacco: Never Used  Substance and Sexual Activity  . Alcohol use: Not Currently  . Drug use: No  . Sexual activity: Yes    Birth control/protection: Surgical  Lifestyle  . Physical activity    Days per week: Not on file    Minutes per session: Not on file  . Stress: Not on file  Relationships  . Social Herbalist on phone: Not on file    Gets together: Not on file    Attends religious service: Not on file    Active member of club or organization: Not on file    Attends meetings of clubs or organizations: Not on file    Relationship status: Not on file  . Intimate partner violence    Fear of current or ex partner: Not on file    Emotionally abused: Not on file    Physically abused: Not  on file    Forced sexual activity: Not on file  Other Topics Concern  . Not on file  Social History Narrative  . Not on file   Lives at home with husband.  FAMILY HISTORY: Family History  Problem Relation Age of Onset  . Heart disease Father   . Diabetes Paternal Grandmother   . Diabetes Paternal Grandfather   . Brain cancer Mother 12  . Bone cancer Sister 11  . Colon cancer Maternal Uncle        dx 50s-60s  . Throat cancer Cousin   . Leukemia Cousin   . Lung cancer Cousin   . Ovarian cancer Neg Hx     ALLERGIES:  is allergic to codeine and penicillins.  MEDICATIONS:  Current Outpatient Medications  Medication Sig Dispense Refill  . acetaminophen (TYLENOL) 500 MG tablet Take 500 mg by mouth every 6 (six) hours as needed.    Marland Kitchen albuterol (PROVENTIL HFA;VENTOLIN HFA) 108 (90 Base) MCG/ACT inhaler Inhale 2 puffs into the lungs every 6 (six) hours as needed for wheezing or shortness of breath.     Marland Kitchen amLODipine (NORVASC) 5 MG tablet Take 5 mg by mouth daily.    Marland Kitchen  atorvastatin (LIPITOR) 10 MG tablet Take 10 mg by mouth daily.    . Cholecalciferol (VITAMIN D3 MAXIMUM STRENGTH) 5000 units capsule Take 5,000 Units by mouth daily.    Marland Kitchen gabapentin (NEURONTIN) 300 MG capsule Take 300 mg by mouth 4 (four) times daily.     . hydrochlorothiazide (HYDRODIURIL) 25 MG tablet Take 25 mg by mouth daily.    . lansoprazole (PREVACID) 30 MG capsule Take 30 mg by mouth 2 (two) times daily.     Marland Kitchen levothyroxine (SYNTHROID, LEVOTHROID) 50 MCG tablet Take 50 mcg by mouth daily before breakfast.     . lidocaine-prilocaine (EMLA) cream Apply to affected area once 30 g 3  . losartan (COZAAR) 100 MG tablet Take 100 mg by mouth daily.    . magnesium chloride (SLOW-MAG) 64 MG TBEC SR tablet Take 1 tablet (64 mg total) by mouth daily. 30 tablet 0  . nystatin (MYCOSTATIN) 100000 UNIT/ML suspension TAKE 1 TEASPOONFUL (5ML) BY MOUTH 4 TIMES DAILY 473 mL 0  . potassium chloride SA (K-DUR) 20 MEQ tablet Take 2 tablets (40 mEq total) by mouth daily. 60 tablet 0  . promethazine (PHENERGAN) 25 MG tablet TAKE ONE TABLET BY MOUTH EVERY 6 HOURS AS NEEDED FOR NAUSEA / VOMITING 60 tablet 1  . valACYclovir (VALTREX) 500 MG tablet Take 1 tablet (500 mg total) by mouth 2 (two) times daily. 70 tablet 0  . zolpidem (AMBIEN) 10 MG tablet Take 10 mg by mouth at bedtime.      No current facility-administered medications for this visit.      PHYSICAL EXAMINATION: ECOG PERFORMANCE STATUS: 0 - Asymptomatic Vitals:   08/16/18 0944  BP: (!) 170/107  Pulse: 80  Resp: 18  Temp: (!) 96.6 F (35.9 C)  SpO2: 97%   Filed Weights   08/16/18 0944  Weight: 239 lb 9.6 oz (108.7 kg)    Physical Exam Constitutional:      General: She is not in acute distress.    Appearance: She is obese.  HENT:     Head: Normocephalic and atraumatic.     Mouth/Throat:     Comments: White plaque on tongue. Eyes:     General: No scleral icterus.    Pupils: Pupils are equal, round, and reactive to light.  Neck:  Musculoskeletal: Normal range of motion and neck supple.  Cardiovascular:     Rate and Rhythm: Normal rate and regular rhythm.     Heart sounds: Normal heart sounds.  Pulmonary:     Effort: Pulmonary effort is normal. No respiratory distress.     Breath sounds: No wheezing.  Abdominal:     General: Bowel sounds are normal. There is no distension.     Palpations: Abdomen is soft. There is no mass.     Tenderness: There is no abdominal tenderness.  Musculoskeletal: Normal range of motion.        General: No deformity.  Skin:    General: Skin is warm and dry.     Findings: No erythema or rash.  Neurological:     Mental Status: She is alert and oriented to person, place, and time.     Cranial Nerves: No cranial nerve deficit.     Coordination: Coordination normal.  Psychiatric:        Behavior: Behavior normal.        Thought Content: Thought content normal.       LABORATORY DATA:  I have reviewed the data as listed Lab Results  Component Value Date   WBC 4.1 08/16/2018   HGB 10.9 (L) 08/16/2018   HCT 32.3 (L) 08/16/2018   MCV 93.6 08/16/2018   PLT 209 08/16/2018   Recent Labs    08/02/18 0930 08/09/18 0825 08/16/18 0901  NA 138 137 137  K 3.4* 3.3* 3.3*  CL 102 100 105  CO2 _0 GLUCOSE 162* 187* 187*  BUN _1 CREATININE 0.72 0.78 0.69  CALCIUM 8.9 9.3 8.8*  GFRNONAA >60 >60 >60  GFRAA >60 >60 >60  PROT 6.5 6.6 6.7  ALBUMIN 3.5 3.6 3.8  AST 64* 52* 66*  ALT 83* 66* 71*  ALKPHOS 54 52 52  BILITOT 0.7 0.9 0.9   Iron/TIBC/Ferritin/ %Sat No results found for: IRON, TIBC, FERRITIN, IRONPCTSAT   Pathology 04/26/2018 CASE: [id number]  PATIENT: Clovis Cao  Surgical Pathology Report DIAGNOSIS:  A. BREAST, LEFT UPPER INNER QUADRANT; WIDE EXCISION:  - INVASIVE MAMMARY CARCINOMA.  - SEE CANCER SUMMARY BELOW.  - BIOPSY SITE CHANGE WITH HEMATOMA AND RIBBON CLIP.  - UNREMARKABLE SKIN.  B. SENTINEL LYMPH NODE #1; EXCISION:  - ONE LYMPH NODE  NEGATIVE FOR MALIGNANCY (0/1).  C. NEW BREAST MARGIN, SUPERIOR; EXCISION:  - BENIGN BREAST TISSUE WITH BIOPSY CHANGE AND HEMATOMA.  - NEGATIVE FOR MALIGNANCY.  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, without comedonecrosis  Margins:    Invasive Carcinoma Margins: Uninvolved by invasive carcinoma            Distance from closest margin: 1.7 mm            Specify closest margin: Anterior       DCIS Margins: Uninvolved by DCIS  Distance from closest margin: 1.2 mm            Specify closest margin: Anterior  Regional Lymph Nodes: Uninvolved by tumor cells    Total Number of Lymph Nodes Examined: 1       Number of Sentinel Nodes Examined: 1  Treatment Effect in the Breast: No known presurgical therapy  Lymphovascular Invasion: Not identified  Pathologic Stage Classification (pTNM, AJCC 8th Edition): pT1c pN0 (sn)   Breast Biomarker Testing Performed on Previous Biopsy: [id number]  Estrogen Receptor (ER) Status: NEGATIVE  Progesterone Receptor (PgR) Status: NEGATIVE  HER2 (by immunohistochemistry): NEGATIVE     ASSESSMENT & PLAN:  1. 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 ([place])   2. Hypomagnesemia   3. Hypokalemia   4. Encounter for antineoplastic chemotherapy   5. Thrush   Cancer Staging 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 [facility]) Staging form: Breast, AJCC 8th Edition - Clinical stage from 04/28/2018: Stage IB (cT1c, cN0, cM0, G3, ER-, PR-, HER2-)  - Signed by Earlie Server, MD on 04/29/2018 - Pathologic stage from 05/25/2018: Stage IB (pT1c, pN0(sn), cM0, G3, ER-, PR-, HER2-) - Signed by Earlie Server, MD on 05/25/2018  # Stage IB left triple negative breast cancer S/p left lumpectomy and sentinel lymph node biopsy.  Status post 4 cycle of weekly Taxol.   Overall tolerating well with mild difficulties. Taxol dose has been decreased to 70 mg/m 6 cycle 4, due to neuropathy. Neuropathy recommend patient to continue use gabapentin 300 mg 4 times a day. Labs reviewed and discussed with patient. Counts acceptable to proceed with cycle 5 Taxol 70 mg/m today.  #Mild transaminitis, close monitoring. #Mild oral thrush, recommend patient continue use nystatin oral rinse.  Hold Diflucan as she already has mild transaminitis secondary to Taxol treatment.  Diflucan may cause additional liver toxicities.   If oral thrush worsen, will restart Diflucan.  #Recent history of shingle, continue Valtrex 500 mg twice daily for prophylaxis.  #Hyponatremia, potassium 3.3 today.  Likely secondary to electrolyte wasting Continue oral potassium supplementation, advised patient to switch to 20 mEq twice daily. #Hypomagnesia, stable magnesium level at 1.7.  Continue Slow-Mag 1 tablet daily.  Follow-up in 1 week  for evaluation prior to starting next cycle of  Taxol.  All questions were answered. The patient knows to call the clinic with any problems questions or concerns.  Earlie Server, MD, PhD 08/16/2018

## 2018-08-23 ENCOUNTER — Inpatient Hospital Stay: Payer: PPO | Attending: Oncology

## 2018-08-23 ENCOUNTER — Other Ambulatory Visit: Payer: Self-pay

## 2018-08-23 ENCOUNTER — Inpatient Hospital Stay: Payer: PPO

## 2018-08-23 ENCOUNTER — Inpatient Hospital Stay (HOSPITAL_BASED_OUTPATIENT_CLINIC_OR_DEPARTMENT_OTHER): Payer: PPO | Admitting: Oncology

## 2018-08-23 ENCOUNTER — Encounter: Payer: Self-pay | Admitting: Oncology

## 2018-08-23 VITALS — BP 123/84 | HR 84

## 2018-08-23 VITALS — BP 122/81 | HR 102 | Temp 98.0°F | Ht 65.0 in | Wt 241.4 lb

## 2018-08-23 DIAGNOSIS — E876 Hypokalemia: Secondary | ICD-10-CM

## 2018-08-23 DIAGNOSIS — B370 Candidal stomatitis: Secondary | ICD-10-CM

## 2018-08-23 DIAGNOSIS — C50919 Malignant neoplasm of unspecified site of unspecified female breast: Secondary | ICD-10-CM

## 2018-08-23 DIAGNOSIS — Z79899 Other long term (current) drug therapy: Secondary | ICD-10-CM | POA: Insufficient documentation

## 2018-08-23 DIAGNOSIS — B029 Zoster without complications: Secondary | ICD-10-CM

## 2018-08-23 DIAGNOSIS — E860 Dehydration: Secondary | ICD-10-CM

## 2018-08-23 DIAGNOSIS — Z5111 Encounter for antineoplastic chemotherapy: Secondary | ICD-10-CM | POA: Diagnosis not present

## 2018-08-23 DIAGNOSIS — G629 Polyneuropathy, unspecified: Secondary | ICD-10-CM

## 2018-08-23 DIAGNOSIS — Z171 Estrogen receptor negative status [ER-]: Secondary | ICD-10-CM | POA: Insufficient documentation

## 2018-08-23 DIAGNOSIS — Z95828 Presence of other vascular implants and grafts: Secondary | ICD-10-CM

## 2018-08-23 LAB — MAGNESIUM: Magnesium: 1.6 mg/dL — ABNORMAL LOW (ref 1.7–2.4)

## 2018-08-23 LAB — CBC WITH DIFFERENTIAL/PLATELET
Abs Immature Granulocytes: 0.01 10*3/uL (ref 0.00–0.07)
Basophils Absolute: 0 10*3/uL (ref 0.0–0.1)
Basophils Relative: 1 %
Eosinophils Absolute: 0.1 10*3/uL (ref 0.0–0.5)
Eosinophils Relative: 4 %
HCT: 29.6 % — ABNORMAL LOW (ref 36.0–46.0)
Hemoglobin: 10.1 g/dL — ABNORMAL LOW (ref 12.0–15.0)
Immature Granulocytes: 0 %
Lymphocytes Relative: 22 %
Lymphs Abs: 0.5 10*3/uL — ABNORMAL LOW (ref 0.7–4.0)
MCH: 32.5 pg (ref 26.0–34.0)
MCHC: 34.1 g/dL (ref 30.0–36.0)
MCV: 95.2 fL (ref 80.0–100.0)
Monocytes Absolute: 0.3 10*3/uL (ref 0.1–1.0)
Monocytes Relative: 12 %
Neutro Abs: 1.4 10*3/uL — ABNORMAL LOW (ref 1.7–7.7)
Neutrophils Relative %: 61 %
Platelets: 201 10*3/uL (ref 150–400)
RBC: 3.11 MIL/uL — ABNORMAL LOW (ref 3.87–5.11)
RDW: 17.2 % — ABNORMAL HIGH (ref 11.5–15.5)
WBC: 2.3 10*3/uL — ABNORMAL LOW (ref 4.0–10.5)
nRBC: 0 % (ref 0.0–0.2)

## 2018-08-23 LAB — COMPREHENSIVE METABOLIC PANEL
ALT: 57 U/L — ABNORMAL HIGH (ref 0–44)
AST: 58 U/L — ABNORMAL HIGH (ref 15–41)
Albumin: 3.6 g/dL (ref 3.5–5.0)
Alkaline Phosphatase: 59 U/L (ref 38–126)
Anion gap: 12 (ref 5–15)
BUN: 12 mg/dL (ref 8–23)
CO2: 25 mmol/L (ref 22–32)
Calcium: 9.1 mg/dL (ref 8.9–10.3)
Chloride: 99 mmol/L (ref 98–111)
Creatinine, Ser: 0.88 mg/dL (ref 0.44–1.00)
GFR calc Af Amer: >60 mL/min (ref >60)
GFR calc non Af Amer: >60 mL/min (ref >60)
Glucose, Bld: 206 mg/dL — ABNORMAL HIGH (ref 70–99)
Potassium: 3 mmol/L — ABNORMAL LOW (ref 3.5–5.1)
Sodium: 136 mmol/L (ref 135–145)
Total Bilirubin: 0.8 mg/dL (ref 0.3–1.2)
Total Protein: 6.5 g/dL (ref 6.5–8.1)

## 2018-08-23 MED ORDER — FAMOTIDINE IN NACL 20-0.9 MG/50ML-% IV SOLN
20.0000 mg | Freq: Once | INTRAVENOUS | Status: DC
  Filled 2018-08-23: qty 50

## 2018-08-23 MED ORDER — DIPHENHYDRAMINE HCL 50 MG/ML IJ SOLN
50.0000 mg | Freq: Once | INTRAMUSCULAR | Status: AC
  Administered 2018-08-23: 50 mg via INTRAVENOUS
  Filled 2018-08-23: qty 1

## 2018-08-23 MED ORDER — GABAPENTIN 300 MG PO CAPS: 600.0000 mg | ORAL_CAPSULE | Freq: Three times a day (TID) | ORAL | 0 refills | Status: DC

## 2018-08-23 MED ORDER — SODIUM CHLORIDE 0.9 % IV SOLN
Freq: Once | INTRAVENOUS | Status: AC
  Administered 2018-08-23: 12:00:00 via INTRAVENOUS
  Filled 2018-08-23: qty 10

## 2018-08-23 MED ORDER — HEPARIN SOD (PORK) LOCK FLUSH 100 UNIT/ML IV SOLN
500.0000 [IU] | Freq: Once | INTRAVENOUS | Status: AC | PRN
  Administered 2018-08-23: 500 [IU]
  Filled 2018-08-23: qty 5

## 2018-08-23 MED ORDER — SODIUM CHLORIDE 0.9% FLUSH
10.0000 mL | Freq: Once | INTRAVENOUS | Status: AC
  Administered 2018-08-23: 10 mL via INTRAVENOUS
  Filled 2018-08-23: qty 10

## 2018-08-23 MED ORDER — SODIUM CHLORIDE 0.9 % IV SOLN
65.0000 mg/m2 | Freq: Once | INTRAVENOUS | Status: AC
  Administered 2018-08-23: 11:00:00 144 mg via INTRAVENOUS
  Filled 2018-08-23: qty 24

## 2018-08-23 MED ORDER — SODIUM CHLORIDE 0.9 % IV SOLN
20.0000 mg | Freq: Once | INTRAVENOUS | Status: AC
  Administered 2018-08-23: 10:00:00 20 mg via INTRAVENOUS
  Filled 2018-08-23: qty 2

## 2018-08-23 MED ORDER — SODIUM CHLORIDE 0.9 % IV SOLN
Freq: Once | INTRAVENOUS | Status: AC
  Administered 2018-08-23: 10:00:00 via INTRAVENOUS
  Filled 2018-08-23: qty 250

## 2018-08-23 NOTE — Progress Notes (Signed)
Hematology/Oncology progress note [facility] Telephone:([phone] [phone]   Patient Care Team: Idelle Crouch, MD as PCP - General (Internal Medicine) Bary Castilla Forest Gleason, MD as Consulting Physician (General Surgery)  REFERRING PROVIDER: Idelle Crouch, MD REASON FOR VISIT:  Follow up for adjuvant chemotherapy for breast cancer.   HISTORY OF PRESENTING ILLNESS:  Megan Harris is a  68 y.o.  female present for management of  breast cancer  # Stage IB left triple negative breast cancer   03/27/2018 which showed left breast mass. She had diagnostic and ultrasound on 04/07/2018 which showed 0.9cm x 0.9cm x 1.4cm irregular border mixed echotexture mass at the left breast 11 o'clock 7 cm from nipple correlating to the mammographic finding. Ultrasound of the left axilla is negative She underwent biopsy of left breast mass. 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 Estrogen receptor negative, progesterone receptor negative, HER-2 negative.  S/p left lumpectomy and sentinel lymph node biopsy. pT1pN0 05/09/2018 Baseline MUGA showed no focal wall motion abnormality of the left ventricle.  Calculated LVEF 61%  # 05/24/2018- 07/05/2018 Adjuvant Chemotherapy ddAC- # 07/19/2018 started on weekly Taxol 80 mg/m. Starting cycle 4, Taxol was decreased to 15m/m2 # 08/23/2018 Cycle cycle 6, Taxol was decreased to 65 mg/m due to neuropathy.  INTERVAL HISTORY GPANAGIOTA PERFETTIis a 68y.o. female who has above history reviewed by me today presents for follow up visit for management of Stage IB Triple negative breast cancer, evaluation prior to chemotherapy. #Patient has finished 4 cycles of dd AC with Onpro G-CSF support. Patient is currently on weekly Taxol treatment. #Her lower extremity pre-existing neuropathy has gotten worse.  Bilateral fingertip pain/tingling stable.  Nausea is controlled with antiemetics. Appetite is fair. Continue to use nystatin oral rinse.      Review of Systems  Constitutional: Positive for fatigue. Negative for appetite change, chills, fever and unexpected weight change.  HENT:   Negative for hearing loss and voice change.   Eyes: Negative for eye problems.  Respiratory: Negative for chest tightness and cough.   Cardiovascular: Negative for chest pain.  Gastrointestinal: Negative for abdominal distention, abdominal pain and blood in stool.  Endocrine: Negative for hot flashes.  Genitourinary: Negative for difficulty urinating and frequency.   Musculoskeletal: Negative for arthralgias.  Skin: Negative for itching and rash.  Neurological: Positive for numbness. Negative for extremity weakness.  Hematological: Negative for adenopathy.  Psychiatric/Behavioral: Negative for confusion. The patient is not nervous/anxious.     MEDICAL HISTORY:  Past Medical History:  Diagnosis Date  . Abdominal pain, left lower quadrant 2012  . Anemia    vitamin d deficiency  . Arthritis   . Asthma   . Back pain 2005   lower back arthritis  . [place]   . [place]   161m T1c, N0 triple negative  . Chronic cholecystitis 2012   has had choleycystectomy.  not an issue  . Constipation 2005  . Endocrine problem 2005  . Family history of bone cancer   . Family history of brain cancer   . Family history of colon cancer   . Family history of lung cancer   . GERD (gastroesophageal reflux disease) 2012  . Hyperlipidemia 2012  . Hypertension 2002  . Nausea with vomiting 2012  . Obesity, unspecified 2012  . Pneumonia   . PONV (postoperative nausea and vomiting)   . Special screening for  malignant neoplasms, colon 2012    SURGICAL HISTORY: Past Surgical History:  Procedure Laterality Date  . BILATERAL CARPAL TUNNEL RELEASE Bilateral   . BREAST BIOPSY Left 04/13/2018   Pending Path  . BREAST EXCISIONAL BIOPSY  Left early 90s   neg  . BREAST LUMPECTOMY Left 04/26/2018   Procedure: BREAST LUMPECTOMY WITH EXCISION OF SENTINEL NODE;  Surgeon: Robert Bellow, MD;  Location: [facility] ORS;  Service: General;  Laterality: Left;  . BUNIONECTOMY Bilateral 2003  . CHOLECYSTECTOMY  06/04/2010  . COLON RESECTION  2004   due diverticulitis   . COLONOSCOPY  2005   [place], Dr. Bary Castilla  . COLONOSCOPY  06/28/2012  . COLOSTOMY  2004  . COLOSTOMY REVERSAL  2004  . FOOT SURGERY Bilateral 2012   plantar faciatis  . HAND SURGERY Bilateral    carpal tunnel  . HERNIA REPAIR  2005   at colostomy site after reversal done  . JOINT REPLACEMENT    . KNEE ARTHROPLASTY Right 04/20/2017   Procedure: COMPUTER ASSISTED TOTAL KNEE ARTHROPLASTY;  Surgeon: Dereck Leep, MD;  Location: [facility] ORS;  Service: Orthopedics;  Laterality: Right;  . KNEE ARTHROSCOPY Right 02/03/2015   Procedure: ARTHROSCOPY right knee, partial medial menisectomy, condyle malleolus, patella and femoral;  Surgeon: Dereck Leep, MD;  Location: [facility] ORS;  Service: Orthopedics;  Laterality: Right;  . NASAL SINUS SURGERY     2005  . PORTACATH PLACEMENT Right 04/26/2018   Procedure: INSERTION PORT-A-CATH RIGHT;  Surgeon: Robert Bellow, MD;  Location: [facility] ORS;  Service: General;  Laterality: Right;  . SALPINGOOPHORECTOMY  1998  . TONSILLECTOMY    . TUBAL LIGATION  1978  . UPPER GI ENDOSCOPY  06/28/2012  . VAGINAL HYSTERECTOMY  1987    SOCIAL HISTORY: Social History   Socioeconomic History  . Marital status: Married    Spouse name: Francee Piccolo  . Number of children: 2  . Years of education: Not on file  . Highest education level: Not on file  Occupational History  . 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 Financial resource strain: Not on file  . Food insecurity    Worry: Not on file    Inability: Not on file  . Transportation needs    Medical: Not on file    Non-medical: Not on file  Tobacco Use  . Smoking  status: Never Smoker  . Smokeless tobacco: Never Used  Substance and Sexual Activity  . Alcohol use: Not Currently  . Drug use: No  . Sexual activity: Yes    Birth control/protection: Surgical  Lifestyle  . Physical activity    Days per week: Not on file    Minutes per session: Not on file  . Stress: Not on file  Relationships  . Social Herbalist on phone: Not on file    Gets together: Not on file    Attends religious service: Not on file    Active member of club or organization: Not on file    Attends meetings of clubs or organizations: Not on file    Relationship status: Not on file  . Intimate partner violence    Fear of current or ex partner: Not on file    Emotionally abused: Not on file    Physically abused: Not on file    Forced sexual activity: Not on file  Other Topics Concern  . Not on file  Social History Narrative  .  Not on file   Lives at home with husband.  FAMILY HISTORY: Family History  Problem Relation Age of Onset  . Heart disease Father   . Diabetes Paternal Grandmother   . Diabetes Paternal Grandfather   . Brain cancer Mother 27  . Bone cancer Sister 73  . Colon cancer Maternal Uncle        dx 50s-60s  . Throat cancer Cousin   . Leukemia Cousin   . Lung cancer Cousin   . Ovarian cancer Neg Hx     ALLERGIES:  is allergic to codeine and penicillins.  MEDICATIONS:  Current Outpatient Medications  Medication Sig Dispense Refill  . acetaminophen (TYLENOL) 500 MG tablet Take 500 mg by mouth every 6 (six) hours as needed.    Marland Kitchen albuterol (PROVENTIL HFA;VENTOLIN HFA) 108 (90 Base) MCG/ACT inhaler Inhale 2 puffs into the lungs every 6 (six) hours as needed for wheezing or shortness of breath.     Marland Kitchen amLODipine (NORVASC) 5 MG tablet Take 5 mg by mouth daily.    Marland Kitchen atorvastatin (LIPITOR) 10 MG tablet Take 10 mg by mouth daily.    . Cholecalciferol (VITAMIN D3 MAXIMUM STRENGTH) 5000 units capsule Take 5,000 Units by mouth daily.    Marland Kitchen  gabapentin (NEURONTIN) 300 MG capsule Take 300 mg by mouth 4 (four) times daily.     . hydrochlorothiazide (HYDRODIURIL) 25 MG tablet Take 25 mg by mouth daily.    . lansoprazole (PREVACID) 30 MG capsule Take 30 mg by mouth 2 (two) times daily.     Marland Kitchen levothyroxine (SYNTHROID, LEVOTHROID) 50 MCG tablet Take 50 mcg by mouth daily before breakfast.     . lidocaine-prilocaine (EMLA) cream Apply to affected area once 30 g 3  . losartan (COZAAR) 100 MG tablet Take 100 mg by mouth daily.    . magnesium chloride (SLOW-MAG) 64 MG TBEC SR tablet Take 1 tablet (64 mg total) by mouth daily. 30 tablet 0  . nystatin (MYCOSTATIN) 100000 UNIT/ML suspension TAKE 1 TEASPOONFUL (5ML) BY MOUTH 4 TIMES DAILY 473 mL 0  . potassium chloride SA (K-DUR) 20 MEQ tablet Take 2 tablets (40 mEq total) by mouth daily. 60 tablet 0  . promethazine (PHENERGAN) 25 MG tablet TAKE ONE TABLET BY MOUTH EVERY 6 HOURS AS NEEDED FOR NAUSEA / VOMITING 60 tablet 1  . valACYclovir (VALTREX) 500 MG tablet Take 1 tablet (500 mg total) by mouth 2 (two) times daily. 70 tablet 0  . zolpidem (AMBIEN) 10 MG tablet Take 10 mg by mouth at bedtime.      No current facility-administered medications for this visit.      PHYSICAL EXAMINATION: ECOG PERFORMANCE STATUS: 0 - Asymptomatic Vitals:   08/23/18 0854  BP: 122/81  Pulse: (!) 102  Temp: 98 F (36.7 C)   Filed Weights   08/23/18 0854  Weight: 241 lb 6 oz (109.5 kg)    Physical Exam Constitutional:      General: She is not in acute distress.    Appearance: She is obese.  HENT:     Head: Normocephalic and atraumatic.     Mouth/Throat:     Comments: White plaque on tongue. Eyes:     General: No scleral icterus.    Pupils: Pupils are equal, round, and reactive to light.  Neck:     Musculoskeletal: Normal range of motion and neck supple.  Cardiovascular:     Rate and Rhythm: Normal rate and regular rhythm.     Heart sounds: Normal heart  sounds.  Pulmonary:     Effort:  Pulmonary effort is normal. No respiratory distress.     Breath sounds: No wheezing.  Abdominal:     General: Bowel sounds are normal. There is no distension.     Palpations: Abdomen is soft. There is no mass.     Tenderness: There is no abdominal tenderness.  Musculoskeletal: Normal range of motion.        General: No deformity.  Skin:    General: Skin is warm and dry.     Findings: No erythema or rash.  Neurological:     Mental Status: She is alert and oriented to person, place, and time.     Cranial Nerves: No cranial nerve deficit.     Coordination: Coordination normal.  Psychiatric:        Behavior: Behavior normal.        Thought Content: Thought content normal.       LABORATORY DATA:  I have reviewed the data as listed Lab Results  Component Value Date   WBC 2.3 (L) 08/23/2018   HGB 10.1 (L) 08/23/2018   HCT 29.6 (L) 08/23/2018   MCV 95.2 08/23/2018   PLT 201 08/23/2018   Recent Labs    08/09/18 0825 08/16/18 0901 08/23/18 0822  NA 137 137 136  K 3.3* 3.3* 3.0*  CL 100 105 99  CO2 26 25 25   GLUCOSE 187* 187* 206*  BUN 15 12 12   CREATININE 0.78 0.69 0.88  CALCIUM 9.3 8.8* 9.1  GFRNONAA >60 >60 >60  GFRAA >60 >60 >60  PROT 6.6 6.7 6.5  ALBUMIN 3.6 3.8 3.6  AST 52* 66* 58*  ALT 66* 71* 57*  ALKPHOS 52 52 59  BILITOT 0.9 0.9 0.8   Iron/TIBC/Ferritin/ %Sat No results found for: IRON, TIBC, FERRITIN, IRONPCTSAT   Pathology 04/26/2018 CASE: [id number]  PATIENT: Clovis Cao  Surgical Pathology Report DIAGNOSIS:  A. BREAST, LEFT UPPER INNER QUADRANT; WIDE EXCISION:  - INVASIVE MAMMARY CARCINOMA.  - SEE CANCER SUMMARY BELOW.  - BIOPSY SITE CHANGE WITH HEMATOMA AND RIBBON CLIP.  - UNREMARKABLE SKIN.  B. SENTINEL LYMPH NODE #1; EXCISION:  - ONE LYMPH NODE NEGATIVE FOR MALIGNANCY (0/1).  C. NEW BREAST MARGIN, SUPERIOR; EXCISION:  - BENIGN BREAST TISSUE WITH BIOPSY CHANGE AND HEMATOMA.  - NEGATIVE FOR MALIGNANCY.  CANCER CASE SUMMARY: INVASIVE  CARCINOMA OF THE BREAST  Procedure: Wide excision  Specimen Laterality: Left  Tumor Size: 16 mm  Histologic Type: Invasive carcinoma of no special type (ductal)  Histologic Grade (Nottingham Histologic Score)            Glandular (Acinar)/Tubular Differentiation: 3            Nuclear Pleomorphism: 2            Mitotic Rate: 3            Overall Grade: 3  Ductal Carcinoma In Situ (DCIS): Present, nuclear grade 2-3, without comedonecrosis  Margins:    Invasive Carcinoma Margins: Uninvolved by invasive carcinoma            Distance from closest margin: 1.7 mm            Specify closest margin: Anterior       DCIS Margins: Uninvolved by DCIS            Distance from closest margin: 1.2 mm            Specify closest margin: Anterior  Regional Lymph Nodes: Uninvolved by tumor  cells    Total Number of Lymph Nodes Examined: 1       Number of Sentinel Nodes Examined: 1  Treatment Effect in the Breast: No known presurgical therapy  Lymphovascular Invasion: Not identified  Pathologic Stage Classification (pTNM, AJCC 8th Edition): pT1c pN0 (sn)   Breast Biomarker Testing Performed on Previous Biopsy: [id number]  Estrogen Receptor (ER) Status: NEGATIVE  Progesterone Receptor (PgR) Status: NEGATIVE  HER2 (by immunohistochemistry): NEGATIVE     ASSESSMENT & PLAN:  1. 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 ([place])   2. Hypokalemia   3. Hypomagnesemia   4. Encounter for antineoplastic chemotherapy   5. Thrush   6. Herpes zoster without complication   7. Neuropathy   Cancer Staging 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 [facility]) Staging form: Breast, AJCC 8th Edition - Clinical stage from 04/28/2018: Stage IB (cT1c, cN0, cM0, G3, ER-, PR-, HER2-) - Signed by Earlie Server, MD on 04/29/2018 - Pathologic stage from 05/25/2018: Stage IB (pT1c, pN0(sn), cM0, G3, ER-, PR-, HER2-) - Signed by  Earlie Server, MD on 05/25/2018  # Stage IB left triple negative breast cancer S/p left lumpectomy and sentinel lymph node biopsy.  Currently on weekly Taxol treatment. Labs are reviewed and discussed with patient. Counts acceptable to proceed with cycle 6 Taxol.  Dose reduced to 65 mg/m today.  #Pre-existing neuropathy, worsening on Doxil. Decrease Taxol to 65 mg/m. Recommend patient to increase gabapentin to 600 mg 3 times daily.  #Mild transaminitis.  Stable.  Monitor  #Recent history of shingle, continue Valtrex 500 mg twice daily for prophylaxis.  #Hyponatremia, potassium 3.0 today.  #Hypo-magnesium, magnesium 1.6. Proceed with 2 g of magnesium sulfate IV x1, potassium chloride 20 mEq IV x1. Continue oral potassium chloride 40 mEq daily plus Slow-Mag 1 tablet daily. Follow-up in 1 week  for evaluation prior to starting next cycle of  Taxol.  All questions were answered. The patient knows to call the clinic with any problems questions or concerns.  Earlie Server, MD, PhD 08/23/2018

## 2018-08-23 NOTE — Progress Notes (Signed)
HR 102, ok to proceed per MD 

## 2018-08-25 ENCOUNTER — Telehealth: Payer: Self-pay | Admitting: *Deleted

## 2018-08-25 NOTE — Telephone Encounter (Signed)
Patient called reporting side effects from her chemotherapy. Her feet are peeling on the bottom and that she has a blister on the side of her big toe and her left big toe nail is coming. She has no open ores at this time and is using vaseline on her feet to help with hte dryness and keeping socks on. She is asking if you want to have a virtual appointment with her today to see her feet. Please advise

## 2018-08-25 NOTE — Telephone Encounter (Signed)
Called patient, asked her if she has tried Udder Cream, she says no but her friend (who was with her during the call) says she used Udder Cream for her husbands peeling feet during his chemo and told Megan Harris it works well and knows where to get it. Patient states that the peeling is not bothersome, just annoying. Offered her to come in to have an evaluation, patient chooses try the cream over the weekend, she will let us know on Monday if its not clearing up and will schedule to be evaluated.

## 2018-08-30 ENCOUNTER — Inpatient Hospital Stay (HOSPITAL_BASED_OUTPATIENT_CLINIC_OR_DEPARTMENT_OTHER): Payer: PPO | Admitting: Oncology

## 2018-08-30 ENCOUNTER — Other Ambulatory Visit: Payer: Self-pay

## 2018-08-30 ENCOUNTER — Inpatient Hospital Stay: Payer: PPO

## 2018-08-30 ENCOUNTER — Encounter: Payer: Self-pay | Admitting: Oncology

## 2018-08-30 VITALS — BP 114/87 | HR 93 | Temp 97.9°F | Resp 18 | Wt 242.2 lb

## 2018-08-30 DIAGNOSIS — C50919 Malignant neoplasm of unspecified site of unspecified female breast: Secondary | ICD-10-CM

## 2018-08-30 DIAGNOSIS — E860 Dehydration: Secondary | ICD-10-CM

## 2018-08-30 DIAGNOSIS — Z5111 Encounter for antineoplastic chemotherapy: Secondary | ICD-10-CM | POA: Diagnosis not present

## 2018-08-30 DIAGNOSIS — E876 Hypokalemia: Secondary | ICD-10-CM

## 2018-08-30 DIAGNOSIS — G629 Polyneuropathy, unspecified: Secondary | ICD-10-CM | POA: Diagnosis not present

## 2018-08-30 DIAGNOSIS — B029 Zoster without complications: Secondary | ICD-10-CM | POA: Diagnosis not present

## 2018-08-30 DIAGNOSIS — S90424A Blister (nonthermal), right lesser toe(s), initial encounter: Secondary | ICD-10-CM

## 2018-08-30 DIAGNOSIS — B370 Candidal stomatitis: Secondary | ICD-10-CM | POA: Diagnosis not present

## 2018-08-30 LAB — CBC WITH DIFFERENTIAL/PLATELET
Abs Immature Granulocytes: 0.06 10*3/uL (ref 0.00–0.07)
Basophils Absolute: 0 10*3/uL (ref 0.0–0.1)
Basophils Relative: 1 %
Eosinophils Absolute: 0.1 10*3/uL (ref 0.0–0.5)
Eosinophils Relative: 3 %
HCT: 30.5 % — ABNORMAL LOW (ref 36.0–46.0)
Hemoglobin: 10.4 g/dL — ABNORMAL LOW (ref 12.0–15.0)
Immature Granulocytes: 2 %
Lymphocytes Relative: 18 %
Lymphs Abs: 0.7 10*3/uL (ref 0.7–4.0)
MCH: 32.9 pg (ref 26.0–34.0)
MCHC: 34.1 g/dL (ref 30.0–36.0)
MCV: 96.5 fL (ref 80.0–100.0)
Monocytes Absolute: 0.4 10*3/uL (ref 0.1–1.0)
Monocytes Relative: 10 %
Neutro Abs: 2.6 10*3/uL (ref 1.7–7.7)
Neutrophils Relative %: 66 %
Platelets: 258 10*3/uL (ref 150–400)
RBC: 3.16 MIL/uL — ABNORMAL LOW (ref 3.87–5.11)
RDW: 16.4 % — ABNORMAL HIGH (ref 11.5–15.5)
WBC: 3.9 10*3/uL — ABNORMAL LOW (ref 4.0–10.5)
nRBC: 1.3 % — ABNORMAL HIGH (ref 0.0–0.2)

## 2018-08-30 LAB — COMPREHENSIVE METABOLIC PANEL
ALT: 56 U/L — ABNORMAL HIGH (ref 0–44)
AST: 47 U/L — ABNORMAL HIGH (ref 15–41)
Albumin: 3.7 g/dL (ref 3.5–5.0)
Alkaline Phosphatase: 55 U/L (ref 38–126)
Anion gap: 11 (ref 5–15)
BUN: 11 mg/dL (ref 8–23)
CO2: 26 mmol/L (ref 22–32)
Calcium: 9 mg/dL (ref 8.9–10.3)
Chloride: 101 mmol/L (ref 98–111)
Creatinine, Ser: 0.81 mg/dL (ref 0.44–1.00)
GFR calc Af Amer: >60 mL/min (ref >60)
GFR calc non Af Amer: >60 mL/min (ref >60)
Glucose, Bld: 173 mg/dL — ABNORMAL HIGH (ref 70–99)
Potassium: 3.2 mmol/L — ABNORMAL LOW (ref 3.5–5.1)
Sodium: 138 mmol/L (ref 135–145)
Total Bilirubin: 1 mg/dL (ref 0.3–1.2)
Total Protein: 6.5 g/dL (ref 6.5–8.1)

## 2018-08-30 LAB — MAGNESIUM: Magnesium: 1.6 mg/dL — ABNORMAL LOW (ref 1.7–2.4)

## 2018-08-30 LAB — URIC ACID: Uric Acid, Serum: 5.6 mg/dL (ref 2.5–7.1)

## 2018-08-30 MED ORDER — SODIUM CHLORIDE 0.9% FLUSH
10.0000 mL | Freq: Once | INTRAVENOUS | Status: AC
  Administered 2018-08-30: 10 mL via INTRAVENOUS
  Filled 2018-08-30: qty 10

## 2018-08-30 MED ORDER — FAMOTIDINE IN NACL 20-0.9 MG/50ML-% IV SOLN
20.0000 mg | Freq: Once | INTRAVENOUS | Status: AC
  Administered 2018-08-30: 20 mg via INTRAVENOUS
  Filled 2018-08-30: qty 50

## 2018-08-30 MED ORDER — DIPHENHYDRAMINE HCL 50 MG/ML IJ SOLN
50.0000 mg | Freq: Once | INTRAMUSCULAR | Status: AC
  Administered 2018-08-30: 50 mg via INTRAVENOUS
  Filled 2018-08-30: qty 1

## 2018-08-30 MED ORDER — MAGNESIUM CHLORIDE 64 MG PO TBEC: 1.0000 | DELAYED_RELEASE_TABLET | Freq: Two times a day (BID) | ORAL | 0 refills | Status: DC

## 2018-08-30 MED ORDER — SODIUM CHLORIDE 0.9 % IV SOLN
65.0000 mg/m2 | Freq: Once | INTRAVENOUS | Status: AC
  Administered 2018-08-30: 144 mg via INTRAVENOUS
  Filled 2018-08-30: qty 24

## 2018-08-30 MED ORDER — SODIUM CHLORIDE 0.9 % IV SOLN
Freq: Once | INTRAVENOUS | Status: AC
  Administered 2018-08-30: 10:00:00 via INTRAVENOUS
  Filled 2018-08-30: qty 10

## 2018-08-30 MED ORDER — HEPARIN SOD (PORK) LOCK FLUSH 100 UNIT/ML IV SOLN
500.0000 [IU] | Freq: Once | INTRAVENOUS | Status: AC
  Administered 2018-08-30: 500 [IU] via INTRAVENOUS
  Filled 2018-08-30: qty 5

## 2018-08-30 MED ORDER — SODIUM CHLORIDE 0.9 % IV SOLN
20.0000 mg | Freq: Once | INTRAVENOUS | Status: AC
  Administered 2018-08-30: 20 mg via INTRAVENOUS
  Filled 2018-08-30: qty 2

## 2018-08-30 MED ORDER — SODIUM CHLORIDE 0.9 % IV SOLN
Freq: Once | INTRAVENOUS | Status: AC
  Administered 2018-08-30: 10:00:00 via INTRAVENOUS
  Filled 2018-08-30: qty 250

## 2018-08-30 NOTE — Progress Notes (Signed)
Hematology/Oncology progress note [facility] Telephone:([phone] [phone]   Patient Care Team: Idelle Crouch, MD as PCP - General (Internal Medicine) Bary Castilla Forest Gleason, MD as Consulting Physician (General Surgery)  REFERRING PROVIDER: Idelle Crouch, MD REASON FOR VISIT:  Follow up for adjuvant chemotherapy for breast cancer.   HISTORY OF PRESENTING ILLNESS:  Megan Harris is a  68 y.o.  female present for management of  breast cancer  # Stage IB left triple negative breast cancer   03/27/2018 which showed left breast mass. She had diagnostic and ultrasound on 04/07/2018 which showed 0.9cm x 0.9cm x 1.4cm irregular border mixed echotexture mass at the left breast 11 o'clock 7 cm from nipple correlating to the mammographic finding. Ultrasound of the left axilla is negative She underwent biopsy of left breast mass. 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 Estrogen receptor negative, progesterone receptor negative, HER-2 negative.  S/p left lumpectomy and sentinel lymph node biopsy. pT1pN0 05/09/2018 Baseline MUGA showed no focal wall motion abnormality of the left ventricle.  Calculated LVEF 61%  # 05/24/2018- 07/05/2018 Adjuvant Chemotherapy ddAC- # 07/19/2018 started on weekly Taxol 80 mg/m. Starting cycle 4, Taxol was decreased to '70mg'$ /m2 # 08/23/2018 Cycle cycle 6, Taxol was decreased to 65 mg/m due to neuropathy.  INTERVAL HISTORY Megan Harris is a 68 y.o. female who has above history reviewed by me today presents for follow up visit for management of Stage IB Triple negative breast cancer, evaluation prior to chemotherapy. #Patient has finished 4 cycles of dd AC with Onpro G-CSF support. Patient is currently on weekly Taxol treatment. Patient reports that she developed skin peeling on the bottom of her feet.  Improved after using [place]  Cream Also had a blister over the right bunion area, with some soreness.  The area gets better today. Continue to have pre-existing neuropathy.  Bilateral fingertip pain/tingling stable. She takes gabapentin. Nausea is controlled with antiemetics. Appetite is fair.       Review of Systems  Constitutional: Positive for fatigue. Negative for appetite change, chills, fever and unexpected weight change.  HENT:   Negative for hearing loss and voice change.   Eyes: Negative for eye problems.  Respiratory: Negative for chest tightness and cough.   Cardiovascular: Negative for chest pain.  Gastrointestinal: Negative for abdominal distention, abdominal pain and blood in stool.  Endocrine: Negative for hot flashes.  Genitourinary: Negative for difficulty urinating and frequency.   Musculoskeletal: Negative for arthralgias.       Right toe bunion skin healing blister.  No discharge or fluid collection.   Skin: Negative for itching and rash.       Skin peeling Blister over right toe.  Neurological: Positive for numbness. Negative for extremity weakness.  Hematological: Negative for adenopathy.  Psychiatric/Behavioral: Negative for confusion. The patient is not nervous/anxious.     MEDICAL HISTORY:  Past Medical History:  Diagnosis Date  . Abdominal pain, left lower quadrant 2012  . Anemia    vitamin d deficiency  . Arthritis   . Asthma   . Back pain 2005   lower back arthritis  . Bone cancer ([place])   . Cancer ([place]) 04/26/2018   43m, T1c, N0 triple negative  . Chronic cholecystitis 2012   has had choleycystectomy.  not an issue  . Constipation 2005  . Endocrine problem 2005  . Family history of bone cancer   . Family history  of brain cancer   . Family history of colon cancer   . Family history of lung cancer   . GERD (gastroesophageal reflux disease) 2012  . Hyperlipidemia 2012  . Hypertension 2002  . Nausea with vomiting 2012  . Obesity, unspecified 2012  . Pneumonia   .  PONV (postoperative nausea and vomiting)   . Special screening for malignant neoplasms, colon 2012    SURGICAL HISTORY: Past Surgical History:  Procedure Laterality Date  . BILATERAL CARPAL TUNNEL RELEASE Bilateral   . BREAST BIOPSY Left 04/13/2018   Pending Path  . BREAST EXCISIONAL BIOPSY Left early 90s   neg  . BREAST LUMPECTOMY Left 04/26/2018   Procedure: BREAST LUMPECTOMY WITH EXCISION OF SENTINEL NODE;  Surgeon: Robert Bellow, MD;  Location: [facility] ORS;  Service: General;  Laterality: Left;  . BUNIONECTOMY Bilateral 2003  . CHOLECYSTECTOMY  06/04/2010  . COLON RESECTION  2004   due diverticulitis   . COLONOSCOPY  2005   [place], Dr. Bary Castilla  . COLONOSCOPY  06/28/2012  . COLOSTOMY  2004  . COLOSTOMY REVERSAL  2004  . FOOT SURGERY Bilateral 2012   plantar faciatis  . HAND SURGERY Bilateral    carpal tunnel  . HERNIA REPAIR  2005   at colostomy site after reversal done  . JOINT REPLACEMENT    . KNEE ARTHROPLASTY Right 04/20/2017   Procedure: COMPUTER ASSISTED TOTAL KNEE ARTHROPLASTY;  Surgeon: Dereck Leep, MD;  Location: [facility] ORS;  Service: Orthopedics;  Laterality: Right;  . KNEE ARTHROSCOPY Right 02/03/2015   Procedure: ARTHROSCOPY right knee, partial medial menisectomy, condyle malleolus, patella and femoral;  Surgeon: Dereck Leep, MD;  Location: [facility] ORS;  Service: Orthopedics;  Laterality: Right;  . NASAL SINUS SURGERY     2005  . PORTACATH PLACEMENT Right 04/26/2018   Procedure: INSERTION PORT-A-CATH RIGHT;  Surgeon: Robert Bellow, MD;  Location: [facility] ORS;  Service: General;  Laterality: Right;  . SALPINGOOPHORECTOMY  1998  . TONSILLECTOMY    . TUBAL LIGATION  1978  . UPPER GI ENDOSCOPY  06/28/2012  . VAGINAL HYSTERECTOMY  1987    SOCIAL HISTORY: Social History   Socioeconomic History  . Marital status: Married    Spouse name: Francee Piccolo  . Number of children: 2  . Years of education: Not on file  . Highest education level: Not on file  Occupational  History  . 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 Financial resource strain: Not on file  . Food insecurity    Worry: Not on file    Inability: Not on file  . Transportation needs    Medical: Not on file    Non-medical: Not on file  Tobacco Use  . Smoking status: Never Smoker  . Smokeless tobacco: Never Used  Substance and Sexual Activity  . Alcohol use: Not Currently  . Drug use: No  . Sexual activity: Yes    Birth control/protection: Surgical  Lifestyle  . Physical activity    Days per week: Not on file    Minutes per session: Not on file  . Stress: Not on file  Relationships  . Social Herbalist on phone: Not on file    Gets together: Not on file    Attends religious service: Not on file    Active member of club or organization: Not on file    Attends meetings of clubs or organizations: Not on file  Relationship status: Not on file  . Intimate partner violence    Fear of current or ex partner: Not on file    Emotionally abused: Not on file    Physically abused: Not on file    Forced sexual activity: Not on file  Other Topics Concern  . Not on file  Social History Narrative  . Not on file   Lives at home with husband.  FAMILY HISTORY: Family History  Problem Relation Age of Onset  . Heart disease Father   . Diabetes Paternal Grandmother   . Diabetes Paternal Grandfather   . Brain cancer Mother 36  . Bone cancer Sister 58  . Colon cancer Maternal Uncle        dx 50s-60s  . Throat cancer Cousin   . Leukemia Cousin   . Lung cancer Cousin   . Ovarian cancer Neg Hx     ALLERGIES:  is allergic to codeine and penicillins.  MEDICATIONS:  Current Outpatient Medications  Medication Sig Dispense Refill  . acetaminophen (TYLENOL) 500 MG tablet Take 500 mg by mouth every 6 (six) hours as needed.    Marland Kitchen albuterol (PROVENTIL HFA;VENTOLIN HFA) 108 (90 Base) MCG/ACT inhaler Inhale 2 puffs into the lungs every 6 (six)  hours as needed for wheezing or shortness of breath.     Marland Kitchen amLODipine (NORVASC) 5 MG tablet Take 5 mg by mouth daily.    Marland Kitchen atorvastatin (LIPITOR) 10 MG tablet Take 10 mg by mouth daily.    . Cholecalciferol (VITAMIN D3 MAXIMUM STRENGTH) 5000 units capsule Take 5,000 Units by mouth daily.    Marland Kitchen gabapentin (NEURONTIN) 300 MG capsule Take 2 capsules (600 mg total) by mouth 3 (three) times daily. 180 capsule 0  . hydrochlorothiazide (HYDRODIURIL) 25 MG tablet Take 25 mg by mouth daily.    . lansoprazole (PREVACID) 30 MG capsule Take 30 mg by mouth 2 (two) times daily.     Marland Kitchen levothyroxine (SYNTHROID, LEVOTHROID) 50 MCG tablet Take 50 mcg by mouth daily before breakfast.     . lidocaine-prilocaine (EMLA) cream Apply to affected area once 30 g 3  . losartan (COZAAR) 100 MG tablet Take 100 mg by mouth daily.    . magnesium chloride (SLOW-MAG) 64 MG TBEC SR tablet Take 1 tablet (64 mg total) by mouth daily. 30 tablet 0  . nystatin (MYCOSTATIN) 100000 UNIT/ML suspension TAKE 1 TEASPOONFUL (5ML) BY MOUTH 4 TIMES DAILY 473 mL 0  . potassium chloride SA (K-DUR) 20 MEQ tablet Take 2 tablets (40 mEq total) by mouth daily. 60 tablet 0  . promethazine (PHENERGAN) 25 MG tablet TAKE ONE TABLET BY MOUTH EVERY 6 HOURS AS NEEDED FOR NAUSEA / VOMITING 60 tablet 1  . valACYclovir (VALTREX) 500 MG tablet Take 1 tablet (500 mg total) by mouth 2 (two) times daily. 70 tablet 0  . zolpidem (AMBIEN) 10 MG tablet Take 10 mg by mouth at bedtime.      No current facility-administered medications for this visit.      PHYSICAL EXAMINATION: ECOG PERFORMANCE STATUS: 0 - Asymptomatic Vitals:   08/30/18 0908  BP: 114/87  Pulse: 93  Resp: 18  Temp: 97.9 F (36.6 C)   Filed Weights   08/30/18 0908  Weight: 242 lb 3.2 oz (109.9 kg)    Physical Exam Constitutional:      General: She is not in acute distress.    Appearance: She is obese.  HENT:     Head: Normocephalic and atraumatic.  Mouth/Throat:     Comments:  White plaque on tongue. Eyes:     General: No scleral icterus.    Pupils: Pupils are equal, round, and reactive to light.  Neck:     Musculoskeletal: Normal range of motion and neck supple.  Cardiovascular:     Rate and Rhythm: Normal rate and regular rhythm.     Heart sounds: Normal heart sounds.  Pulmonary:     Effort: Pulmonary effort is normal. No respiratory distress.     Breath sounds: No wheezing.  Abdominal:     General: Bowel sounds are normal. There is no distension.     Palpations: Abdomen is soft. There is no mass.     Tenderness: There is no abdominal tenderness.  Musculoskeletal: Normal range of motion.        General: No deformity.  Skin:    General: Skin is warm and dry.     Findings: No erythema or rash.  Neurological:     Mental Status: She is alert and oriented to person, place, and time.     Cranial Nerves: No cranial nerve deficit.     Coordination: Coordination normal.  Psychiatric:        Behavior: Behavior normal.        Thought Content: Thought content normal.         LABORATORY DATA:  I have reviewed the data as listed Lab Results  Component Value Date   WBC 3.9 (L) 08/30/2018   HGB 10.4 (L) 08/30/2018   HCT 30.5 (L) 08/30/2018   MCV 96.5 08/30/2018   PLT 258 08/30/2018   Recent Labs    08/16/18 0901 08/23/18 0822 08/30/18 0843  NA 137 136 138  K 3.3* 3.0* 3.2*  CL 105 99 101  CO2 _0 GLUCOSE 187* 206* 173*  BUN _1 CREATININE 0.69 0.88 0.81  CALCIUM 8.8* 9.1 9.0  GFRNONAA >60 >60 >60  GFRAA >60 >60 >60  PROT 6.7 6.5 6.5  ALBUMIN 3.8 3.6 3.7  AST 66* 58* 47*  ALT 71* 57* 56*  ALKPHOS 52 59 55  BILITOT 0.9 0.8 1.0   Iron/TIBC/Ferritin/ %Sat No results found for: IRON, TIBC, FERRITIN, IRONPCTSAT   Pathology 04/26/2018 CASE: [id number]  PATIENT: Clovis Cao  Surgical Pathology Report DIAGNOSIS:  A. BREAST, LEFT UPPER INNER QUADRANT; WIDE EXCISION:  - INVASIVE MAMMARY CARCINOMA.  - SEE CANCER SUMMARY  BELOW.  - BIOPSY SITE CHANGE WITH HEMATOMA AND RIBBON CLIP.  - UNREMARKABLE SKIN.  B. SENTINEL LYMPH NODE #1; EXCISION:  - ONE LYMPH NODE NEGATIVE FOR MALIGNANCY (0/1).  C. NEW BREAST MARGIN, SUPERIOR; EXCISION:  - BENIGN BREAST TISSUE WITH BIOPSY CHANGE AND HEMATOMA.  - NEGATIVE FOR MALIGNANCY.  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, without comedonecrosis  Margins:    Invasive Carcinoma Margins: Uninvolved by invasive carcinoma            Distance from  closest margin: 1.7 mm            Specify closest margin: Anterior       DCIS Margins: Uninvolved by DCIS            Distance from closest margin: 1.2 mm            Specify closest margin: Anterior  Regional Lymph Nodes: Uninvolved by tumor cells    Total Number of Lymph Nodes Examined: 1       Number of Sentinel Nodes Examined: 1  Treatment Effect in the Breast: No known presurgical therapy  Lymphovascular Invasion: Not identified  Pathologic Stage Classification (pTNM, AJCC 8th Edition): pT1c pN0 (sn)   Breast Biomarker Testing Performed on Previous Biopsy: [id number]  Estrogen Receptor (ER) Status: NEGATIVE  Progesterone Receptor (PgR) Status: NEGATIVE  HER2 (by immunohistochemistry): NEGATIVE     ASSESSMENT & PLAN:  1. 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 ([place])   2. Hypomagnesemia   3. Hypokalemia   4. Encounter for antineoplastic chemotherapy   5. Herpes zoster without complication   6. Thrush   7.  Neuropathy   8. Toe blister without infection, right, initial encounter   Cancer Staging 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 [facility]) Staging form: Breast, AJCC 8th Edition - Clinical stage from 04/28/2018: Stage IB (cT1c, cN0, cM0, G3, ER-, PR-, HER2-) - Signed by Earlie Server, MD on 04/29/2018 - Pathologic stage from 05/25/2018: Stage IB (pT1c, pN0(sn), cM0, G3, ER-, PR-, HER2-) - Signed by Earlie Server, MD on 05/25/2018  # Stage IB left triple negative breast cancer S/p left lumpectomy and sentinel lymph node biopsy.  Currently on weekly Taxol treatment. Clinically tolerating well with neuropathy Labs are reviewed and discussed with patient.    counts acceptable proceed with cycle 7 Taxol at reduced dose of 65 mg/m. Marland Kitchen  #Pre-existing neuropathy, worsening on Taxol. Decrease Taxol to 65 mg/m. Continue gabapentin to 600 mg 3 times daily.  #Mild transaminitis.  Stable monitor.  #Recent history of shingle, continue   #Hyponatremia, potassium 3.2 today.   #Hypo-magnesium, magnesium 1.6. We will proceed with 2 g of magnesium sulfate IV x1, potassium chloride 20 mEq IV x1 in 250 mils of normal saline. Continue oral potassium chloride 20 mEq twice daily, suggest to increase Slow-Mag 1 tablet twice daily.  #Right toe blister, improved per patient.  Uric acid was normal.  Continue monitor. #Thrush continue nystatin oral rinse.  Follow-up in 1 week  for evaluation prior to starting next cycle of  Taxol.  All questions were answered. The patient knows to call the clinic with any problems questions or concerns.  Earlie Server, MD, PhD 08/30/2018

## 2018-08-30 NOTE — Progress Notes (Signed)
Pt in for follow up, has peeling on soles of feet and large blister below right big toe on the "bunion area of toe".

## 2018-08-30 NOTE — Progress Notes (Signed)
Per Dr. Tasia Catchings pt to receive 20 MEQs of Potassium and 2 g of Magnesium in addition to scheduled Taxol treatment.

## 2018-08-31 ENCOUNTER — Other Ambulatory Visit: Payer: Self-pay | Admitting: Oncology

## 2018-08-31 NOTE — Telephone Encounter (Signed)
...    Ref Range & Units 1d ago 8d ago 2wk ago  Potassium 3.5 - 5.1 mmol/L 3.2Low   3.0Low   3.3Low

## 2018-09-06 ENCOUNTER — Other Ambulatory Visit: Payer: Self-pay

## 2018-09-06 ENCOUNTER — Inpatient Hospital Stay: Payer: PPO

## 2018-09-06 ENCOUNTER — Inpatient Hospital Stay (HOSPITAL_BASED_OUTPATIENT_CLINIC_OR_DEPARTMENT_OTHER): Payer: PPO | Admitting: Oncology

## 2018-09-06 ENCOUNTER — Encounter: Payer: Self-pay | Admitting: Oncology

## 2018-09-06 VITALS — BP 125/78 | HR 94 | Temp 98.0°F | Resp 16 | Ht 65.0 in | Wt 242.1 lb

## 2018-09-06 DIAGNOSIS — D701 Agranulocytosis secondary to cancer chemotherapy: Secondary | ICD-10-CM

## 2018-09-06 DIAGNOSIS — B029 Zoster without complications: Secondary | ICD-10-CM | POA: Diagnosis not present

## 2018-09-06 DIAGNOSIS — E876 Hypokalemia: Secondary | ICD-10-CM

## 2018-09-06 DIAGNOSIS — Z5111 Encounter for antineoplastic chemotherapy: Secondary | ICD-10-CM | POA: Diagnosis not present

## 2018-09-06 DIAGNOSIS — B370 Candidal stomatitis: Secondary | ICD-10-CM | POA: Diagnosis not present

## 2018-09-06 DIAGNOSIS — T451X5A Adverse effect of antineoplastic and immunosuppressive drugs, initial encounter: Secondary | ICD-10-CM

## 2018-09-06 DIAGNOSIS — E860 Dehydration: Secondary | ICD-10-CM

## 2018-09-06 DIAGNOSIS — C50919 Malignant neoplasm of unspecified site of unspecified female breast: Secondary | ICD-10-CM

## 2018-09-06 DIAGNOSIS — S90424A Blister (nonthermal), right lesser toe(s), initial encounter: Secondary | ICD-10-CM | POA: Diagnosis not present

## 2018-09-06 DIAGNOSIS — G629 Polyneuropathy, unspecified: Secondary | ICD-10-CM | POA: Diagnosis not present

## 2018-09-06 LAB — CBC WITH DIFFERENTIAL/PLATELET
Abs Immature Granulocytes: 0.05 10*3/uL (ref 0.00–0.07)
Basophils Absolute: 0 10*3/uL (ref 0.0–0.1)
Basophils Relative: 1 %
Eosinophils Absolute: 0.1 10*3/uL (ref 0.0–0.5)
Eosinophils Relative: 3 %
HCT: 30.7 % — ABNORMAL LOW (ref 36.0–46.0)
Hemoglobin: 10.3 g/dL — ABNORMAL LOW (ref 12.0–15.0)
Immature Granulocytes: 2 %
Lymphocytes Relative: 21 %
Lymphs Abs: 0.5 10*3/uL — ABNORMAL LOW (ref 0.7–4.0)
MCH: 33 pg (ref 26.0–34.0)
MCHC: 33.6 g/dL (ref 30.0–36.0)
MCV: 98.4 fL (ref 80.0–100.0)
Monocytes Absolute: 0.3 10*3/uL (ref 0.1–1.0)
Monocytes Relative: 13 %
Neutro Abs: 1.4 10*3/uL — ABNORMAL LOW (ref 1.7–7.7)
Neutrophils Relative %: 60 %
Platelets: 231 10*3/uL (ref 150–400)
RBC: 3.12 MIL/uL — ABNORMAL LOW (ref 3.87–5.11)
RDW: 15.8 % — ABNORMAL HIGH (ref 11.5–15.5)
WBC: 2.4 10*3/uL — ABNORMAL LOW (ref 4.0–10.5)
nRBC: 0 % (ref 0.0–0.2)

## 2018-09-06 LAB — COMPREHENSIVE METABOLIC PANEL
ALT: 53 U/L — ABNORMAL HIGH (ref 0–44)
AST: 54 U/L — ABNORMAL HIGH (ref 15–41)
Albumin: 3.7 g/dL (ref 3.5–5.0)
Alkaline Phosphatase: 51 U/L (ref 38–126)
Anion gap: 10 (ref 5–15)
BUN: 11 mg/dL (ref 8–23)
CO2: 26 mmol/L (ref 22–32)
Calcium: 9 mg/dL (ref 8.9–10.3)
Chloride: 101 mmol/L (ref 98–111)
Creatinine, Ser: 0.82 mg/dL (ref 0.44–1.00)
GFR calc Af Amer: >60 mL/min (ref >60)
GFR calc non Af Amer: >60 mL/min (ref >60)
Glucose, Bld: 207 mg/dL — ABNORMAL HIGH (ref 70–99)
Potassium: 3.2 mmol/L — ABNORMAL LOW (ref 3.5–5.1)
Sodium: 137 mmol/L (ref 135–145)
Total Bilirubin: 0.9 mg/dL (ref 0.3–1.2)
Total Protein: 6.7 g/dL (ref 6.5–8.1)

## 2018-09-06 LAB — MAGNESIUM: Magnesium: 1.6 mg/dL — ABNORMAL LOW (ref 1.7–2.4)

## 2018-09-06 MED ORDER — SODIUM CHLORIDE 0.9 % IV SOLN
20.0000 mg | Freq: Once | INTRAVENOUS | Status: AC
  Administered 2018-09-06: 11:00:00 20 mg via INTRAVENOUS
  Filled 2018-09-06: qty 2

## 2018-09-06 MED ORDER — HEPARIN SOD (PORK) LOCK FLUSH 100 UNIT/ML IV SOLN
500.0000 [IU] | Freq: Once | INTRAVENOUS | Status: AC | PRN
  Administered 2018-09-06: 500 [IU]
  Filled 2018-09-06: qty 5

## 2018-09-06 MED ORDER — MAGNESIUM SULFATE 2 GM/50ML IV SOLN
2.0000 g | Freq: Once | INTRAVENOUS | Status: AC
  Administered 2018-09-06: 2 g via INTRAVENOUS
  Filled 2018-09-06: qty 50

## 2018-09-06 MED ORDER — SODIUM CHLORIDE 0.9 % IV SOLN: 2.0000 g | Freq: Once | INTRAVENOUS | Status: DC

## 2018-09-06 MED ORDER — DIPHENHYDRAMINE HCL 50 MG/ML IJ SOLN
50.0000 mg | Freq: Once | INTRAMUSCULAR | Status: AC
  Administered 2018-09-06: 50 mg via INTRAVENOUS
  Filled 2018-09-06: qty 1

## 2018-09-06 MED ORDER — FAMOTIDINE IN NACL 20-0.9 MG/50ML-% IV SOLN
20.0000 mg | Freq: Once | INTRAVENOUS | Status: AC
  Administered 2018-09-06: 20 mg via INTRAVENOUS
  Filled 2018-09-06: qty 50

## 2018-09-06 MED ORDER — POTASSIUM CHLORIDE 20 MEQ/100ML IV SOLN
20.0000 meq | Freq: Once | INTRAVENOUS | Status: AC
  Administered 2018-09-06: 20 meq via INTRAVENOUS

## 2018-09-06 MED ORDER — SODIUM CHLORIDE 0.9 % IV SOLN
Freq: Once | INTRAVENOUS | Status: AC
  Administered 2018-09-06: 10:00:00 via INTRAVENOUS
  Filled 2018-09-06: qty 250

## 2018-09-06 MED ORDER — SODIUM CHLORIDE 0.9 % IV SOLN: Freq: Once | INTRAVENOUS | Status: DC

## 2018-09-06 MED ORDER — SODIUM CHLORIDE 0.9 % IV SOLN
60.0000 mg/m2 | Freq: Once | INTRAVENOUS | Status: AC
  Administered 2018-09-06: 12:00:00 132 mg via INTRAVENOUS
  Filled 2018-09-06: qty 22

## 2018-09-06 NOTE — Progress Notes (Signed)
Hematology/Oncology progress note [facility] [phone] [phone]   Patient Care Team: Idelle Crouch, MD as PCP - General (Internal Medicine) Bary Castilla Forest Gleason, MD as Consulting Physician (General Surgery)  REFERRING PROVIDER: Idelle Crouch, MD REASON FOR VISIT:  Follow up for adjuvant chemotherapy for breast cancer.   HISTORY OF PRESENTING ILLNESS:  Megan Harris is a  68 y.o.  female present for management of  breast cancer  # Stage IB left triple negative breast cancer   03/27/2018 which showed left breast mass. She had diagnostic and ultrasound on 04/07/2018 which showed 0.9cm x 0.9cm x 1.4cm irregular border mixed echotexture mass at the left breast 11 o'clock 7 cm from nipple correlating to the mammographic finding. Ultrasound of the left axilla is negative She underwent biopsy of left breast mass. 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 Estrogen receptor negative, progesterone receptor negative, HER-2 negative.  S/p left lumpectomy and sentinel lymph node biopsy. pT1pN0 05/09/2018 Baseline MUGA showed no focal wall motion abnormality of the left ventricle.  Calculated LVEF 61%  # 05/24/2018- 07/05/2018 Adjuvant Chemotherapy ddAC- # 07/19/2018 started on weekly Taxol 80 mg/m. Starting cycle 4, Taxol was decreased to '70mg'$ /m2 # 08/23/2018 Cycle cycle 6, Taxol was decreased to 65 mg/m due to neuropathy.  INTERVAL HISTORY Megan Harris is a 68 y.o. female who has above history reviewed by me today presents for follow up visit for management of Stage IB Triple negative breast cancer, evaluation prior to chemotherapy. #Patient has finished 4 cycles of dd AC with Onpro G-CSF support. Patient is currently on weekly Taxol treatment. Patient reports that overall she is tolerating the chemotherapy well except pre-existing neuropathy of  bilateral lower foot are getting worse.  Taxol has been reduced to 65 mg/m. Patient takes gabapentin. Nausea is well controlled with antiemetics. Antibiotics is planned. Right great toe blister is getting better and resolving. Skin peeling is getting better after using moisturizer cream.       Review of Systems  Constitutional: Positive for fatigue. Negative for appetite change, chills, fever and unexpected weight change.  HENT:   Negative for hearing loss and voice change.   Eyes: Negative for eye problems.  Respiratory: Negative for chest tightness and cough.   Cardiovascular: Negative for chest pain.  Gastrointestinal: Negative for abdominal distention, abdominal pain and blood in stool.  Endocrine: Negative for hot flashes.  Genitourinary: Negative for difficulty urinating and frequency.   Musculoskeletal: Negative for arthralgias.       Right toe bunion skin healing blister.  No discharge or fluid collection.   Skin: Negative for itching and rash.       Skin peeling Healing blister over right toe.  Neurological: Positive for numbness. Negative for extremity weakness.  Hematological: Negative for adenopathy.  Psychiatric/Behavioral: Negative for confusion. The patient is not nervous/anxious.     MEDICAL HISTORY:  Past Medical History:  Diagnosis Date  . Abdominal pain, left lower quadrant 2012  . Anemia    vitamin d deficiency  . Arthritis   . Asthma   . Back pain 2005   lower back arthritis  . Bone cancer ([place])   . Cancer ([place]) 04/26/2018   52m, T1c, N0 triple negative  . Chronic cholecystitis 2012   has had choleycystectomy.  not an issue  . Constipation 2005  . Endocrine problem 2005  . Family history of bone cancer   . Family  history of brain cancer   . Family history of colon cancer   . Family history of lung cancer   . GERD (gastroesophageal reflux disease) 2012  . Hyperlipidemia 2012  . Hypertension 2002  . Nausea with vomiting 2012  . Obesity,  unspecified 2012  . Pneumonia   . PONV (postoperative nausea and vomiting)   . Special screening for malignant neoplasms, colon 2012    SURGICAL HISTORY: Past Surgical History:  Procedure Laterality Date  . BILATERAL CARPAL TUNNEL RELEASE Bilateral   . BREAST BIOPSY Left 04/13/2018   Pending Path  . BREAST EXCISIONAL BIOPSY Left early 90s   neg  . BREAST LUMPECTOMY Left 04/26/2018   Procedure: BREAST LUMPECTOMY WITH EXCISION OF SENTINEL NODE;  Surgeon: Robert Bellow, MD;  Location: [facility] ORS;  Service: General;  Laterality: Left;  . BUNIONECTOMY Bilateral 2003  . CHOLECYSTECTOMY  06/04/2010  . COLON RESECTION  2004   due diverticulitis   . COLONOSCOPY  2005   [place], Dr. Bary Castilla  . COLONOSCOPY  06/28/2012  . COLOSTOMY  2004  . COLOSTOMY REVERSAL  2004  . FOOT SURGERY Bilateral 2012   plantar faciatis  . HAND SURGERY Bilateral    carpal tunnel  . HERNIA REPAIR  2005   at colostomy site after reversal done  . JOINT REPLACEMENT    . KNEE ARTHROPLASTY Right 04/20/2017   Procedure: COMPUTER ASSISTED TOTAL KNEE ARTHROPLASTY;  Surgeon: Dereck Leep, MD;  Location: [facility] ORS;  Service: Orthopedics;  Laterality: Right;  . KNEE ARTHROSCOPY Right 02/03/2015   Procedure: ARTHROSCOPY right knee, partial medial menisectomy, condyle malleolus, patella and femoral;  Surgeon: Dereck Leep, MD;  Location: [facility] ORS;  Service: Orthopedics;  Laterality: Right;  . NASAL SINUS SURGERY     2005  . PORTACATH PLACEMENT Right 04/26/2018   Procedure: INSERTION PORT-A-CATH RIGHT;  Surgeon: Robert Bellow, MD;  Location: [facility] ORS;  Service: General;  Laterality: Right;  . SALPINGOOPHORECTOMY  1998  . TONSILLECTOMY    . TUBAL LIGATION  1978  . UPPER GI ENDOSCOPY  06/28/2012  . VAGINAL HYSTERECTOMY  1987    SOCIAL HISTORY: Social History   Socioeconomic History  . Marital status: Married    Spouse name: Francee Piccolo  . Number of children: 2  . Years of education: Not on file  . Highest education  level: Not on file  Occupational History  . 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 Financial resource strain: Not on file  . Food insecurity    Worry: Not on file    Inability: Not on file  . Transportation needs    Medical: Not on file    Non-medical: Not on file  Tobacco Use  . Smoking status: Never Smoker  . Smokeless tobacco: Never Used  Substance and Sexual Activity  . Alcohol use: Not Currently  . Drug use: No  . Sexual activity: Yes    Birth control/protection: Surgical  Lifestyle  . Physical activity    Days per week: Not on file    Minutes per session: Not on file  . Stress: Not on file  Relationships  . Social Herbalist on phone: Not on file    Gets together: Not on file    Attends religious service: Not on file    Active member of club or organization: Not on file    Attends meetings of clubs or organizations: Not on file  Relationship status: Not on file  . Intimate partner violence    Fear of current or ex partner: Not on file    Emotionally abused: Not on file    Physically abused: Not on file    Forced sexual activity: Not on file  Other Topics Concern  . Not on file  Social History Narrative  . Not on file   Lives at home with husband.  FAMILY HISTORY: Family History  Problem Relation Age of Onset  . Heart disease Father   . Diabetes Paternal Grandmother   . Diabetes Paternal Grandfather   . Brain cancer Mother 33  . Bone cancer Sister 71  . Colon cancer Maternal Uncle        dx 50s-60s  . Throat cancer Cousin   . Leukemia Cousin   . Lung cancer Cousin   . Ovarian cancer Neg Hx     ALLERGIES:  is allergic to codeine and penicillins.  MEDICATIONS:  Current Outpatient Medications  Medication Sig Dispense Refill  . acetaminophen (TYLENOL) 500 MG tablet Take 500 mg by mouth every 6 (six) hours as needed.    Marland Kitchen albuterol (PROVENTIL HFA;VENTOLIN HFA) 108 (90 Base) MCG/ACT inhaler Inhale 2 puffs  into the lungs every 6 (six) hours as needed for wheezing or shortness of breath.     Marland Kitchen amLODipine (NORVASC) 5 MG tablet Take 5 mg by mouth daily.    Marland Kitchen atorvastatin (LIPITOR) 10 MG tablet Take 10 mg by mouth daily.    . Cholecalciferol (VITAMIN D3 MAXIMUM STRENGTH) 5000 units capsule Take 5,000 Units by mouth daily.    Marland Kitchen gabapentin (NEURONTIN) 300 MG capsule TAKE 2 CAPSULES BY MOUTH 3 TIMES DAILY 180 capsule 0  . hydrochlorothiazide (HYDRODIURIL) 25 MG tablet Take 25 mg by mouth daily.    . lansoprazole (PREVACID) 30 MG capsule Take 30 mg by mouth 2 (two) times daily.     Marland Kitchen levothyroxine (SYNTHROID, LEVOTHROID) 50 MCG tablet Take 50 mcg by mouth daily before breakfast.     . lidocaine-prilocaine (EMLA) cream Apply to affected area once 30 g 3  . losartan (COZAAR) 100 MG tablet Take 100 mg by mouth daily.    . magnesium chloride (SLOW-MAG) 64 MG TBEC SR tablet Take 1 tablet (64 mg total) by mouth 2 (two) times daily. 60 tablet 0  . nystatin (MYCOSTATIN) 100000 UNIT/ML suspension TAKE 1 TEASPOONFUL (5ML) BY MOUTH 4 TIMES DAILY 473 mL 0  . potassium chloride SA (K-DUR) 20 MEQ tablet TAKE 2 TABLETS BY MOUTH DAILY 60 tablet 0  . promethazine (PHENERGAN) 25 MG tablet TAKE ONE TABLET BY MOUTH EVERY 6 HOURS AS NEEDED FOR NAUSEA / VOMITING 60 tablet 1  . valACYclovir (VALTREX) 500 MG tablet TAKE 1 TABLET BY MOUTH TWICE DAILY 70 tablet 0  . zolpidem (AMBIEN) 10 MG tablet Take 10 mg by mouth at bedtime.      No current facility-administered medications for this visit.    Facility-Administered Medications Ordered in Other Visits  Medication Dose Route Frequency Provider Last Rate Last Dose  . heparin lock flush 100 unit/mL  500 Units Intracatheter Once PRN Earlie Server, MD      . PACLitaxel (TAXOL) 132 mg in sodium chloride 0.9 % 250 mL chemo infusion (</= 35m/m2)  60 mg/m2 (Treatment Plan Recorded) Intravenous Once YEarlie Server MD 272 mL/hr at 09/06/18 1151 132 mg at 09/06/18 1151     PHYSICAL EXAMINATION:  ECOG PERFORMANCE STATUS: 0 - Asymptomatic Vitals:   09/06/18 0848  BP:  125/78  Pulse: 94  Resp: 16  Temp: 98 F (36.7 C)  SpO2: 98%   Filed Weights   09/06/18 0848  Weight: 242 lb 2 oz (109.8 kg)    Physical Exam Constitutional:      General: She is not in acute distress.    Appearance: She is obese.  HENT:     Head: Normocephalic and atraumatic.     Mouth/Throat:     Comments: White plaque on tongue. Eyes:     General: No scleral icterus.    Pupils: Pupils are equal, round, and reactive to light.  Neck:     Musculoskeletal: Normal range of motion and neck supple.  Cardiovascular:     Rate and Rhythm: Normal rate and regular rhythm.     Heart sounds: Normal heart sounds.  Pulmonary:     Effort: Pulmonary effort is normal. No respiratory distress.     Breath sounds: No wheezing.  Abdominal:     General: Bowel sounds are normal. There is no distension.     Palpations: Abdomen is soft. There is no mass.     Tenderness: There is no abdominal tenderness.  Musculoskeletal: Normal range of motion.        General: No deformity.  Skin:    General: Skin is warm and dry.     Findings: No erythema or rash.  Neurological:     Mental Status: She is alert and oriented to person, place, and time.     Cranial Nerves: No cranial nerve deficit.     Coordination: Coordination normal.  Psychiatric:        Behavior: Behavior normal.        Thought Content: Thought content normal.         LABORATORY DATA:  I have reviewed the data as listed Lab Results  Component Value Date   WBC 2.4 (L) 09/06/2018   HGB 10.3 (L) 09/06/2018   HCT 30.7 (L) 09/06/2018   MCV 98.4 09/06/2018   PLT 231 09/06/2018   Recent Labs    08/23/18 0822 08/30/18 0843 09/06/18 0815  NA 136 138 137  K 3.0* 3.2* 3.2*  CL 99 101 101  CO2 _0 GLUCOSE 206* 173* 207*  BUN _1 CREATININE 0.88 0.81 0.82  CALCIUM 9.1 9.0 9.0  GFRNONAA >60 >60 >60  GFRAA >60 >60 >60  PROT 6.5 6.5 6.7   ALBUMIN 3.6 3.7 3.7  AST 58* 47* 54*  ALT 57* 56* 53*  ALKPHOS 59 55 51  BILITOT 0.8 1.0 0.9   Iron/TIBC/Ferritin/ %Sat No results found for: IRON, TIBC, FERRITIN, IRONPCTSAT   Pathology 04/26/2018 CASE: [id number]  PATIENT: Clovis Cao  Surgical Pathology Report DIAGNOSIS:  A. BREAST, LEFT UPPER INNER QUADRANT; WIDE EXCISION:  - INVASIVE MAMMARY CARCINOMA.  - SEE CANCER SUMMARY BELOW.  - BIOPSY SITE CHANGE WITH HEMATOMA AND RIBBON CLIP.  - UNREMARKABLE SKIN.  B. SENTINEL LYMPH NODE #1; EXCISION:  - ONE LYMPH NODE NEGATIVE FOR MALIGNANCY (0/1).  C. NEW BREAST MARGIN, SUPERIOR; EXCISION:  - BENIGN BREAST TISSUE WITH BIOPSY CHANGE AND HEMATOMA.  - NEGATIVE FOR MALIGNANCY.  CANCER CASE SUMMARY: INVASIVE CARCINOMA OF THE BREAST  Procedure: Wide excision  Specimen Laterality: Left  Tumor Size: 16 mm  Histologic Type: Invasive carcinoma of no special type (ductal)  Histologic Grade (Nottingham Histologic Score)            Glandular (Acinar)/Tubular Differentiation: 3  Nuclear Pleomorphism: 2            Mitotic Rate: 3            Overall Grade: 3  Ductal Carcinoma In Situ (DCIS): Present, nuclear grade 2-3, without comedonecrosis  Margins:    Invasive Carcinoma Margins: Uninvolved by invasive carcinoma            Distance from closest margin: 1.7 mm            Specify closest margin: Anterior       DCIS Margins: Uninvolved by DCIS            Distance from closest margin: 1.2 mm            Specify closest margin: Anterior  Regional Lymph Nodes: Uninvolved by tumor cells    Total Number of Lymph Nodes Examined: 1       Number of Sentinel Nodes Examined: 1  Treatment Effect in the Breast: No known presurgical therapy  Lymphovascular Invasion: Not identified  Pathologic Stage Classification (pTNM, AJCC 8th Edition): pT1c pN0 (sn)   Breast Biomarker Testing  Performed on Previous Biopsy: [id number]  Estrogen Receptor (ER) Status: NEGATIVE  Progesterone Receptor (PgR) Status: NEGATIVE  HER2 (by immunohistochemistry): NEGATIVE     ASSESSMENT & PLAN:  1. 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 ([place])   2. Hypokalemia   3. Hypomagnesemia   4. Encounter for antineoplastic chemotherapy   5. Herpes zoster without complication   6. Thrush   7. Neuropathy   8. Toe blister without infection, right, initial encounter   9. Chemotherapy-induced neutropenia (HCC)   Cancer Staging 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 [facility]) Staging form: Breast, AJCC 8th Edition - Clinical stage from 04/28/2018: Stage IB (cT1c, cN0, cM0, G3, ER-, PR-, HER2-) - Signed by Earlie Server, MD on 04/29/2018 - Pathologic stage from 05/25/2018: Stage IB (pT1c, pN0(sn), cM0, G3, ER-, PR-, HER2-) - Signed by Earlie Server, MD on 05/25/2018  # Stage IB left triple negative breast cancer S/p left lumpectomy and sentinel lymph node biopsy.  Currently on weekly Taxol treatment. Clinically tolerating well except worsening of pre-existing neuropathy.  I asked if the patient wanted take a chemotherapy break, patient prefers to proceed with chemotherapy with reduced dose and see how she feels Labs are reviewed and discussed with patient Leukopenia with mild neutropenia ANC 1.4. Marland Kitchen  Counts acceptable to proceed with cycle 8 Taxol at a reduced dose of 60 mg/m.   #Pre-existing neuropathy, worsened by chemotherapy Taxol. Decrease Taxol to 60 mg/m. Continue gabapentin 600 mg 3 times a day . #Mild transaminitis.  Stable continue to monitor.  #Recent history of shingle, continue prophylactic antibiotics.  #Hyponatremia, potassium 3.2 today.   #Hypo-magnesium, magnesium 1.6. We will proceed with  2 g of magnesium sulfate IV x1, potassium chloride 20 mEq IV x1 in 250 mils of normal saline. Recommend patient to continue take chloride 20 mEq twice daily, continue taking Slow-Mag 1 tablet  twice daily.  #Right toe blister, improving.  Continue to monitor.   #Thrush continue nystatin oral rinse.  Follow-up in 1 week  for evaluation prior to starting next cycle of  Taxol. Patient's son and daughter were FaceTimed per patient's request using her phone.  All questions were answered. The patient knows to call the clinic with any problems questions or concerns.  Earlie Server, MD, PhD 09/06/2018

## 2018-09-06 NOTE — Progress Notes (Signed)
Patient here today for follow up. Patient denies any decrease in appetite, nausea, vomiting, diarrhea, constipation or pain.

## 2018-09-07 DIAGNOSIS — R7309 Other abnormal glucose: Secondary | ICD-10-CM | POA: Diagnosis not present

## 2018-09-07 DIAGNOSIS — I10 Essential (primary) hypertension: Secondary | ICD-10-CM | POA: Diagnosis not present

## 2018-09-07 DIAGNOSIS — Z79899 Other long term (current) drug therapy: Secondary | ICD-10-CM | POA: Diagnosis not present

## 2018-09-07 DIAGNOSIS — E7800 Pure hypercholesterolemia, unspecified: Secondary | ICD-10-CM | POA: Diagnosis not present

## 2018-09-07 DIAGNOSIS — F5104 Psychophysiologic insomnia: Secondary | ICD-10-CM | POA: Diagnosis not present

## 2018-09-13 ENCOUNTER — Encounter: Payer: Self-pay | Admitting: Oncology

## 2018-09-13 ENCOUNTER — Other Ambulatory Visit: Payer: Self-pay

## 2018-09-13 ENCOUNTER — Inpatient Hospital Stay: Payer: PPO

## 2018-09-13 ENCOUNTER — Inpatient Hospital Stay (HOSPITAL_BASED_OUTPATIENT_CLINIC_OR_DEPARTMENT_OTHER): Payer: PPO | Admitting: Oncology

## 2018-09-13 VITALS — BP 130/82 | HR 80 | Temp 98.3°F | Resp 16 | Ht 65.0 in | Wt 242.1 lb

## 2018-09-13 DIAGNOSIS — E7800 Pure hypercholesterolemia, unspecified: Secondary | ICD-10-CM

## 2018-09-13 DIAGNOSIS — B370 Candidal stomatitis: Secondary | ICD-10-CM | POA: Diagnosis not present

## 2018-09-13 DIAGNOSIS — R5383 Other fatigue: Secondary | ICD-10-CM

## 2018-09-13 DIAGNOSIS — G629 Polyneuropathy, unspecified: Secondary | ICD-10-CM | POA: Diagnosis not present

## 2018-09-13 DIAGNOSIS — E860 Dehydration: Secondary | ICD-10-CM

## 2018-09-13 DIAGNOSIS — B029 Zoster without complications: Secondary | ICD-10-CM | POA: Diagnosis not present

## 2018-09-13 DIAGNOSIS — Z95828 Presence of other vascular implants and grafts: Secondary | ICD-10-CM | POA: Diagnosis not present

## 2018-09-13 DIAGNOSIS — C50919 Malignant neoplasm of unspecified site of unspecified female breast: Secondary | ICD-10-CM

## 2018-09-13 DIAGNOSIS — Z0000 Encounter for general adult medical examination without abnormal findings: Secondary | ICD-10-CM | POA: Diagnosis not present

## 2018-09-13 DIAGNOSIS — E876 Hypokalemia: Secondary | ICD-10-CM

## 2018-09-13 DIAGNOSIS — E039 Hypothyroidism, unspecified: Secondary | ICD-10-CM

## 2018-09-13 DIAGNOSIS — Z5111 Encounter for antineoplastic chemotherapy: Secondary | ICD-10-CM | POA: Diagnosis not present

## 2018-09-13 DIAGNOSIS — R7309 Other abnormal glucose: Secondary | ICD-10-CM

## 2018-09-13 LAB — COMPREHENSIVE METABOLIC PANEL
ALT: 56 U/L — ABNORMAL HIGH (ref 0–44)
AST: 47 U/L — ABNORMAL HIGH (ref 15–41)
Albumin: 3.6 g/dL (ref 3.5–5.0)
Alkaline Phosphatase: 52 U/L (ref 38–126)
Anion gap: 13 (ref 5–15)
BUN: 11 mg/dL (ref 8–23)
CO2: 24 mmol/L (ref 22–32)
Calcium: 8.6 mg/dL — ABNORMAL LOW (ref 8.9–10.3)
Chloride: 101 mmol/L (ref 98–111)
Creatinine, Ser: 0.79 mg/dL (ref 0.44–1.00)
GFR calc Af Amer: >60 mL/min (ref >60)
GFR calc non Af Amer: >60 mL/min (ref >60)
Glucose, Bld: 135 mg/dL — ABNORMAL HIGH (ref 70–99)
Potassium: 3.2 mmol/L — ABNORMAL LOW (ref 3.5–5.1)
Sodium: 138 mmol/L (ref 135–145)
Total Bilirubin: 1 mg/dL (ref 0.3–1.2)
Total Protein: 6.3 g/dL — ABNORMAL LOW (ref 6.5–8.1)

## 2018-09-13 LAB — URINALYSIS, COMPLETE (UACMP) WITH MICROSCOPIC
Bilirubin Urine: NEGATIVE
Glucose, UA: NEGATIVE mg/dL
Hgb urine dipstick: NEGATIVE
Ketones, ur: NEGATIVE mg/dL
Nitrite: NEGATIVE
Protein, ur: 30 mg/dL — AB
Specific Gravity, Urine: 1.02 (ref 1.005–1.030)
pH: 5 (ref 5.0–8.0)

## 2018-09-13 LAB — MAGNESIUM: Magnesium: 1.8 mg/dL (ref 1.7–2.4)

## 2018-09-13 LAB — CBC WITH DIFFERENTIAL/PLATELET
Abs Immature Granulocytes: 0.04 10*3/uL (ref 0.00–0.07)
Basophils Absolute: 0 10*3/uL (ref 0.0–0.1)
Basophils Relative: 1 %
Eosinophils Absolute: 0.1 10*3/uL (ref 0.0–0.5)
Eosinophils Relative: 4 %
HCT: 30.8 % — ABNORMAL LOW (ref 36.0–46.0)
Hemoglobin: 10.5 g/dL — ABNORMAL LOW (ref 12.0–15.0)
Immature Granulocytes: 1 %
Lymphocytes Relative: 20 %
Lymphs Abs: 0.6 10*3/uL — ABNORMAL LOW (ref 0.7–4.0)
MCH: 33.3 pg (ref 26.0–34.0)
MCHC: 34.1 g/dL (ref 30.0–36.0)
MCV: 97.8 fL (ref 80.0–100.0)
Monocytes Absolute: 0.3 10*3/uL (ref 0.1–1.0)
Monocytes Relative: 11 %
Neutro Abs: 1.9 10*3/uL (ref 1.7–7.7)
Neutrophils Relative %: 63 %
Platelets: 242 10*3/uL (ref 150–400)
RBC: 3.15 MIL/uL — ABNORMAL LOW (ref 3.87–5.11)
RDW: 14.6 % (ref 11.5–15.5)
WBC: 3.1 10*3/uL — ABNORMAL LOW (ref 4.0–10.5)
nRBC: 0.7 % — ABNORMAL HIGH (ref 0.0–0.2)

## 2018-09-13 LAB — TSH: TSH: 2.275 u[IU]/mL (ref 0.350–4.500)

## 2018-09-13 LAB — LIPID PANEL
Cholesterol: 156 mg/dL (ref 0–200)
HDL: 50 mg/dL (ref >40)
LDL Cholesterol: 74 mg/dL (ref 0–99)
Total CHOL/HDL Ratio: 3.1 RATIO
Triglycerides: 162 mg/dL — ABNORMAL HIGH (ref <150)
VLDL: 32 mg/dL (ref 0–40)

## 2018-09-13 LAB — HEMOGLOBIN A1C
Hgb A1c MFr Bld: 5.6 % (ref 4.8–5.6)
Mean Plasma Glucose: 114.02 mg/dL

## 2018-09-13 MED ORDER — HEPARIN SOD (PORK) LOCK FLUSH 100 UNIT/ML IV SOLN
500.0000 [IU] | Freq: Once | INTRAVENOUS | Status: AC
  Administered 2018-09-13: 13:00:00 500 [IU] via INTRAVENOUS
  Filled 2018-09-13: qty 5

## 2018-09-13 MED ORDER — DIPHENHYDRAMINE HCL 50 MG/ML IJ SOLN
50.0000 mg | Freq: Once | INTRAMUSCULAR | Status: AC
  Administered 2018-09-13: 50 mg via INTRAVENOUS
  Filled 2018-09-13: qty 1

## 2018-09-13 MED ORDER — SODIUM CHLORIDE 0.9 % IV SOLN
20.0000 mg | Freq: Once | INTRAVENOUS | Status: AC
  Administered 2018-09-13: 20 mg via INTRAVENOUS
  Filled 2018-09-13: qty 2

## 2018-09-13 MED ORDER — POTASSIUM CHLORIDE 20 MEQ/100ML IV SOLN
20.0000 meq | Freq: Once | INTRAVENOUS | Status: AC
  Administered 2018-09-13: 20 meq via INTRAVENOUS

## 2018-09-13 MED ORDER — SODIUM CHLORIDE 0.9% FLUSH
10.0000 mL | INTRAVENOUS | Status: DC | PRN
  Administered 2018-09-13: 10 mL via INTRAVENOUS
  Filled 2018-09-13: qty 10

## 2018-09-13 MED ORDER — LORAZEPAM 2 MG/ML IJ SOLN
1.0000 mg | Freq: Once | INTRAMUSCULAR | Status: AC
  Administered 2018-09-13: 1 mg via INTRAVENOUS
  Filled 2018-09-13: qty 1

## 2018-09-13 MED ORDER — SODIUM CHLORIDE 0.9 % IV SOLN
60.0000 mg/m2 | Freq: Once | INTRAVENOUS | Status: AC
  Administered 2018-09-13: 132 mg via INTRAVENOUS
  Filled 2018-09-13: qty 22

## 2018-09-13 MED ORDER — FAMOTIDINE IN NACL 20-0.9 MG/50ML-% IV SOLN
20.0000 mg | Freq: Once | INTRAVENOUS | Status: AC
  Administered 2018-09-13: 20 mg via INTRAVENOUS
  Filled 2018-09-13: qty 50

## 2018-09-13 MED ORDER — PROMETHAZINE HCL 25 MG/ML IJ SOLN: 25.0000 mg | Freq: Once | INTRAMUSCULAR | Status: DC

## 2018-09-13 MED ORDER — SODIUM CHLORIDE 0.9 % IV SOLN
Freq: Once | INTRAVENOUS | Status: AC
  Administered 2018-09-13: 10:00:00 via INTRAVENOUS
  Filled 2018-09-13: qty 250

## 2018-09-13 NOTE — Progress Notes (Addendum)
Hematology/Oncology progress note [facility] [phone] [phone]   Patient Care Team: Idelle Crouch, MD as PCP - General (Internal Medicine) Bary Castilla Forest Gleason, MD as Consulting Physician (General Surgery)  REFERRING PROVIDER: Idelle Crouch, MD REASON FOR VISIT:  Follow up for adjuvant chemotherapy for breast cancer.   HISTORY OF PRESENTING ILLNESS:  Megan [facility] is a  68 y.o.  female present for management of  breast cancer  # Stage IB left triple negative breast cancer   03/27/2018 which showed left breast mass. She had diagnostic and ultrasound on 04/07/2018 which showed 0.9cm x 0.9cm x 1.4cm irregular border mixed echotexture mass at the left breast 11 o'clock 7 cm from nipple correlating to the mammographic finding. Ultrasound of the left axilla is negative She underwent biopsy of left breast mass. 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 Estrogen receptor negative, progesterone receptor negative, HER-2 negative.  S/p left lumpectomy and sentinel lymph node biopsy. pT1pN0 05/09/2018 Baseline MUGA showed no focal wall motion abnormality of the left ventricle.  Calculated LVEF 61%  # 05/24/2018- 07/05/2018 Adjuvant Chemotherapy ddAC- # 07/19/2018 started on weekly Taxol 80 mg/m. Starting cycle 4, Taxol was decreased to 37m/m2 # 08/23/2018 Cycle cycle 6, Taxol was decreased to 65 mg/m due to neuropathy.  INTERVAL HISTORY Megan MOWBRAYis a 68y.o. female who has above history reviewed by me today presents for follow up visit for management of Stage IB Triple negative breast cancer, evaluation prior to chemotherapy. #Patient has finished 4 cycles of dd AC with Onpro G-CSF support. Patient is currently on weekly Taxol treatment. Patient reports that overall she is tolerating the chemotherapy well except pre-existing neuropathy of  bilateral lower foot are getting worse.  Taxol has been reduced to 65 mg/m. She takes gabapentin 600 mg 3 times a day.  Feels that neuropathy is manageable. She she has been icing her feet, bilateral foot pre-existing numbness and tingling are better. Bilateral fingertips chemotherapy-induced neuropathy stable. Appetite is fair. She brought labs ordered by primary care physician Dr. SDoy Hutchingand had done at the [facility] today. Eat anything except a very small bite of her breakfast. Nausea, she takes Phenergan as needed and feels the nausea is well controlled except today due to not eating anything the morning, she feels some " unsettling feeling".      Review of Systems  Constitutional: Positive for fatigue. Negative for appetite change, chills, fever and unexpected weight change.  HENT:   Negative for hearing loss and voice change.   Eyes: Negative for eye problems.  Respiratory: Negative for chest tightness and cough.   Cardiovascular: Negative for chest pain.  Gastrointestinal: Positive for nausea. Negative for abdominal distention, abdominal pain and blood in stool.  Endocrine: Negative for hot flashes.  Genitourinary: Negative for difficulty urinating and frequency.   Musculoskeletal: Negative for arthralgias.       Right toe bunion skin healing blister.  No discharge or fluid collection.   Skin: Negative for itching and rash.       Skin peeling Healing blister over right toe.  Neurological: Positive for numbness. Negative for extremity weakness.  Hematological: Negative for adenopathy.  Psychiatric/Behavioral: Negative for confusion. The patient is not nervous/anxious.     MEDICAL HISTORY:  Past Medical History:  Diagnosis Date  . Abdominal pain, left lower quadrant 2012  . Anemia    vitamin d deficiency  . Arthritis   .  Asthma   . Back pain 2005   lower back arthritis  . Bone cancer ([place])   . Cancer ([place]) 04/26/2018   36m, T1c, N0 triple negative  . Chronic  cholecystitis 2012   has had choleycystectomy.  not an issue  . Constipation 2005  . Endocrine problem 2005  . Family history of bone cancer   . Family history of brain cancer   . Family history of colon cancer   . Family history of lung cancer   . GERD (gastroesophageal reflux disease) 2012  . Hyperlipidemia 2012  . Hypertension 2002  . Nausea with vomiting 2012  . Obesity, unspecified 2012  . Pneumonia   . PONV (postoperative nausea and vomiting)   . Special screening for malignant neoplasms, colon 2012    SURGICAL HISTORY: Past Surgical History:  Procedure Laterality Date  . BILATERAL CARPAL TUNNEL RELEASE Bilateral   . BREAST BIOPSY Left 04/13/2018   Pending Path  . BREAST EXCISIONAL BIOPSY Left early 90s   neg  . BREAST LUMPECTOMY Left 04/26/2018   Procedure: BREAST LUMPECTOMY WITH EXCISION OF SENTINEL NODE;  Surgeon: BRobert Bellow MD;  Location: [facility] ORS;  Service: General;  Laterality: Left;  . BUNIONECTOMY Bilateral 2003  . CHOLECYSTECTOMY  06/04/2010  . COLON RESECTION  2004   due diverticulitis   . [place] . COLONOSCOPY  06/28/2012  . COLOSTOMY  2004  . COLOSTOMY REVERSAL  2004  . FOOT SURGERY Bilateral 2012   plantar faciatis  . HAND SURGERY Bilateral    carpal tunnel  . HERNIA REPAIR  2005   at colostomy site after reversal done  . JOINT REPLACEMENT    . KNEE ARTHROPLASTY Right 04/20/2017   Procedure: COMPUTER ASSISTED TOTAL KNEE ARTHROPLASTY;  Surgeon: HDereck Leep MD;  Location: [facility] ORS;  Service: Orthopedics;  Laterality: Right;  . KNEE ARTHROSCOPY Right 02/03/2015   Procedure: ARTHROSCOPY right knee, partial medial menisectomy, condyle malleolus, patella and femoral;  Surgeon: JDereck Leep MD;  Location: [facility] ORS;  Service: Orthopedics;  Laterality: Right;  . NASAL SINUS SURGERY     2005  . PORTACATH PLACEMENT Right 04/26/2018   Procedure: INSERTION PORT-A-CATH RIGHT;  Surgeon: BRobert Bellow MD;  Location:  [facility] ORS;  Service: General;  Laterality: Right;  . SALPINGOOPHORECTOMY  1998  . TONSILLECTOMY    . TUBAL LIGATION  1978  . UPPER GI ENDOSCOPY  06/28/2012  . VAGINAL HYSTERECTOMY  1987    SOCIAL HISTORY: Social History   Socioeconomic History  . Marital status: Married    Spouse name: RFrancee Piccolo . Number of children: 2  . Years of education: Not on file  . Highest education level: Not on file  Occupational History  . 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 Financial resource strain: Not on file  . Food insecurity    Worry: Not on file    Inability: Not on file  . Transportation needs    Medical: Not on file    Non-medical: Not on file  Tobacco Use  . Smoking status: Never Smoker  . Smokeless tobacco: Never Used  Substance and Sexual Activity  . Alcohol use: Not Currently  . Drug use: No  . Sexual activity: Yes    Birth control/protection: Surgical  Lifestyle  . Physical activity    Days per week: Not on file    Minutes per session: Not on  file  . Stress: Not on file  Relationships  . Social Herbalist on phone: Not on file    Gets together: Not on file    Attends religious service: Not on file    Active member of club or organization: Not on file    Attends meetings of clubs or organizations: Not on file    Relationship status: Not on file  . Intimate partner violence    Fear of current or ex partner: Not on file    Emotionally abused: Not on file    Physically abused: Not on file    Forced sexual activity: Not on file  Other Topics Concern  . Not on file  Social History Narrative  . Not on file   Lives at home with husband.  FAMILY HISTORY: Family History  Problem Relation Age of Onset  . Heart disease Father   . Diabetes Paternal Grandmother   . Diabetes Paternal Grandfather   . Brain cancer Mother 76  . Bone cancer Sister 78  . Colon cancer Maternal Uncle        dx 50s-60s  . Throat cancer Cousin   .  Leukemia Cousin   . Lung cancer Cousin   . Ovarian cancer Neg Hx     ALLERGIES:  is allergic to codeine and penicillins.  MEDICATIONS:  Current Outpatient Medications  Medication Sig Dispense Refill  . acetaminophen (TYLENOL) 500 MG tablet Take 500 mg by mouth every 6 (six) hours as needed.    Marland Kitchen albuterol (PROVENTIL HFA;VENTOLIN HFA) 108 (90 Base) MCG/ACT inhaler Inhale 2 puffs into the lungs every 6 (six) hours as needed for wheezing or shortness of breath.     Marland Kitchen amLODipine (NORVASC) 5 MG tablet Take 5 mg by mouth daily.    Marland Kitchen atorvastatin (LIPITOR) 10 MG tablet Take 10 mg by mouth daily.    . Cholecalciferol (VITAMIN D3 MAXIMUM STRENGTH) 5000 units capsule Take 5,000 Units by mouth daily.    Marland Kitchen gabapentin (NEURONTIN) 300 MG capsule TAKE 2 CAPSULES BY MOUTH 3 TIMES DAILY (Patient taking differently: Take 300 mg by mouth 4 (four) times daily. ) 180 capsule 0  . hydrochlorothiazide (HYDRODIURIL) 25 MG tablet Take 25 mg by mouth daily.    . lansoprazole (PREVACID) 30 MG capsule Take 30 mg by mouth 2 (two) times daily.     Marland Kitchen levothyroxine (SYNTHROID, LEVOTHROID) 50 MCG tablet Take 50 mcg by mouth daily before breakfast.     . lidocaine-prilocaine (EMLA) cream Apply to affected area once 30 g 3  . losartan (COZAAR) 100 MG tablet Take 100 mg by mouth daily.    . magnesium chloride (SLOW-MAG) 64 MG TBEC SR tablet Take 1 tablet (64 mg total) by mouth 2 (two) times daily. 60 tablet 0  . nystatin (MYCOSTATIN) 100000 UNIT/ML suspension TAKE 1 TEASPOONFUL (5ML) BY MOUTH 4 TIMES DAILY 473 mL 0  . potassium chloride SA (K-DUR) 20 MEQ tablet TAKE 2 TABLETS BY MOUTH DAILY 60 tablet 0  . promethazine (PHENERGAN) 25 MG tablet TAKE ONE TABLET BY MOUTH EVERY 6 HOURS AS NEEDED FOR NAUSEA / VOMITING 60 tablet 1  . valACYclovir (VALTREX) 500 MG tablet TAKE 1 TABLET BY MOUTH TWICE DAILY 70 tablet 0  . zolpidem (AMBIEN) 10 MG tablet Take 10 mg by mouth at bedtime.      No current facility-administered medications  for this visit.      PHYSICAL EXAMINATION: ECOG PERFORMANCE STATUS: 0 - Asymptomatic Vitals:   09/13/18  0845 09/13/18 0928  BP:  130/82  Pulse: 80   Resp: 16   Temp: 98.3 F (36.8 C)    Filed Weights   09/13/18 0845  Weight: 242 lb 1.6 oz (109.8 kg)    Physical Exam Constitutional:      General: She is not in acute distress.    Appearance: She is obese.  HENT:     Head: Normocephalic and atraumatic.     Mouth/Throat:     Comments: White plaque on tongue. Eyes:     General: No scleral icterus.    Pupils: Pupils are equal, round, and reactive to light.  Neck:     Musculoskeletal: Normal range of motion and neck supple.  Cardiovascular:     Rate and Rhythm: Normal rate and regular rhythm.     Heart sounds: Normal heart sounds.  Pulmonary:     Effort: Pulmonary effort is normal. No respiratory distress.     Breath sounds: No wheezing.  Abdominal:     General: Bowel sounds are normal. There is no distension.     Palpations: Abdomen is soft. There is no mass.     Tenderness: There is no abdominal tenderness.  Musculoskeletal: Normal range of motion.        General: No deformity.  Skin:    General: Skin is warm and dry.     Findings: No erythema or rash.  Neurological:     Mental Status: She is alert and oriented to person, place, and time.     Cranial Nerves: No cranial nerve deficit.     Coordination: Coordination normal.  Psychiatric:        Behavior: Behavior normal.        Thought Content: Thought content normal.         LABORATORY DATA:  I have reviewed the data as listed Lab Results  Component Value Date   WBC 3.1 (L) 09/13/2018   HGB 10.5 (L) 09/13/2018   HCT 30.8 (L) 09/13/2018   MCV 97.8 09/13/2018   PLT 242 09/13/2018   Recent Labs    08/30/18 0843 09/06/18 0815 09/13/18 0812  NA 138 137 138  K 3.2* 3.2* 3.2*  CL 101 101 101  CO2 _0 GLUCOSE 173* 207* 135*  BUN _1 CREATININE 0.81 0.82 0.79  CALCIUM 9.0 9.0 8.6*   GFRNONAA >60 >60 >60  GFRAA >60 >60 >60  PROT 6.5 6.7 6.3*  ALBUMIN 3.7 3.7 3.6  AST 47* 54* 47*  ALT 56* 53* 56*  ALKPHOS 55 51 52  BILITOT 1.0 0.9 1.0   Iron/TIBC/Ferritin/ %Sat No results found for: IRON, TIBC, FERRITIN, IRONPCTSAT   Pathology 04/26/2018 CASE: [id number]  PATIENT: Clovis Cao  Surgical Pathology Report DIAGNOSIS:  A. BREAST, LEFT UPPER INNER QUADRANT; WIDE EXCISION:  - INVASIVE MAMMARY CARCINOMA.  - SEE CANCER SUMMARY BELOW.  - BIOPSY SITE CHANGE WITH HEMATOMA AND RIBBON CLIP.  - UNREMARKABLE SKIN.  B. SENTINEL LYMPH NODE #1; EXCISION:  - ONE LYMPH NODE NEGATIVE FOR MALIGNANCY (0/1).  C. NEW BREAST MARGIN, SUPERIOR; EXCISION:  - BENIGN BREAST TISSUE WITH BIOPSY CHANGE AND HEMATOMA.  - NEGATIVE FOR MALIGNANCY.  CANCER CASE SUMMARY: INVASIVE CARCINOMA OF THE BREAST  Procedure: Wide excision  Specimen Laterality: Left  Tumor Size: 16 mm  Histologic Type: Invasive carcinoma of no special type (ductal)  Histologic Grade (Nottingham Histologic Score)            Glandular (Acinar)/Tubular Differentiation: 3  Nuclear Pleomorphism: 2            Mitotic Rate: 3            Overall Grade: 3  Ductal Carcinoma In Situ (DCIS): Present, nuclear grade 2-3, without comedonecrosis  Margins:    Invasive Carcinoma Margins: Uninvolved by invasive carcinoma            Distance from closest margin: 1.7 mm            Specify closest margin: Anterior       DCIS Margins: Uninvolved by DCIS            Distance from closest margin: 1.2 mm            Specify closest margin: Anterior  Regional Lymph Nodes: Uninvolved by tumor cells    Total Number of Lymph Nodes Examined: 1       Number of Sentinel Nodes Examined: 1  Treatment Effect in the Breast: No known presurgical therapy  Lymphovascular Invasion: Not identified  Pathologic Stage Classification (pTNM,  AJCC 8th Edition): pT1c pN0 (sn)   Breast Biomarker Testing Performed on Previous Biopsy: [id number]  Estrogen Receptor (ER) Status: NEGATIVE  Progesterone Receptor (PgR) Status: NEGATIVE  HER2 (by immunohistochemistry): NEGATIVE     ASSESSMENT & PLAN:  1. 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 ([place])   2. Routine health maintenance   3. Pure hypercholesterolemia   4. Other fatigue   5. Hypokalemia   6. Encounter for antineoplastic chemotherapy   7. Herpes zoster without complication   8. Thrush   9. Neuropathy   10. Port-A-Cath in place   Cancer Staging 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 [facility]) Staging form: Breast, AJCC 8th Edition - Clinical stage from 04/28/2018: Stage IB (cT1c, cN0, cM0, G3, ER-, PR-, HER2-) - Signed by Earlie Server, MD on 04/29/2018 - Pathologic stage from 05/25/2018: Stage IB (pT1c, pN0(sn), cM0, G3, ER-, PR-, HER2-) - Signed by Earlie Server, MD on 05/25/2018  # Stage IB left triple negative breast cancer S/p left lumpectomy and sentinel lymph node biopsy.  Currently on weekly Taxol 58m/m2 treatment. Reports manageable neuropathy. Takes gabapentin 600 mg 3-5 times daily. Labs are reviewed and discussed with patient.  Counts acceptable to proceed with today's Taxol treatment.  #Mild transaminitis, labs reviewed and discussed with patient.  Stable.  Continue to monitor. # shingle during chemotherapy, continue prophylactic antibiotics.  #Hyponatremia, potassium 3.2 today.  Proceed with potassium chloride 20 mEq IV x1 today.  Continue oral potassium chloride 20 mEq twice daily #Hypo-magnesium, magnesium 1.8.  Continue Slow-Mag 1 tablet twice daily.  #Right toe blister, improving.  Advised patient to continue to keep hand and left foot moisturized  #Thrush continue nystatin swish and spit. #Was called by infusion RN who reported that patient feels nauseated.  She took Phenergan 25 mg at 9:30AM, still feel " unsettling feeling".  I went over to infusion  [facility] and discussed with patient.  She feels that in general nausea is well controlled with oral Phenergan.  Today it might be secondary to not eating breakfast prior to the chemotherapy. We will proceed with Ativan 1 mg IV x 1.  Continue antiemetics at home.  #Patient had blood work prescription prescribed by primary care physician and hope to have blood work done at the [facility] along with her other labs labs are reviewed and forwarded to primary care physician Dr. SDoy Hutching  #Mediport was put in by Dr. BTerri Piedra  She has communicated with surgery and  desires to remove Port-A-Cath after she finishes chemotherapy.  Patient called her daughter Anderson Malta who was updated with above plan. Follow-up in 1 week  for evaluation prior to starting next cycle of  Taxol. Patient's son and daughter were FaceTimed per patient's request using her phone.  All questions were answered. The patient knows to call the clinic with any problems questions or concerns.  Earlie Server, MD, PhD 09/13/2018

## 2018-09-13 NOTE — Progress Notes (Signed)
Patient states, "I have been nauseous this morning. I took my Phenergan at 9:30, but it hasn't helped. I still have an unsettling feeling. Zofran and Compazine do not help. That is why Dr. Tasia Catchings has me on the Phenergan. Can you check with Dr. Tasia Catchings about getting me something for nausea while I'm here today?" MD, Dr. Tasia Catchings, notified. MD came to chair side to see patient. MD placing order for Ativan, see MAR.   [id number] Patient states, "I'm not feeling nauseous anymore. The feeling has settled. I'm feeling much better since you gave me that medicine and I had a nap."

## 2018-09-13 NOTE — Progress Notes (Signed)
Pt states shingles is almost gone. She still has neuropathy of finger tips on both hands and she has it on bottom of feet and going up her legs to shin. She has been icing her feet at times through the day and it is helping it per pt. She also brought labs that dr sparks wants done and Tasia Catchings agreed and I have entered them in

## 2018-09-19 NOTE — Progress Notes (Signed)
Patient is doing well.Patient is asking about when she can get her radiation lined up with Dr. Baruch Gouty. Her last treatment 9/16.

## 2018-09-20 ENCOUNTER — Other Ambulatory Visit: Payer: Self-pay

## 2018-09-20 ENCOUNTER — Inpatient Hospital Stay: Payer: PPO

## 2018-09-20 ENCOUNTER — Inpatient Hospital Stay: Payer: PPO | Attending: Oncology

## 2018-09-20 ENCOUNTER — Inpatient Hospital Stay (HOSPITAL_BASED_OUTPATIENT_CLINIC_OR_DEPARTMENT_OTHER): Payer: PPO | Admitting: Oncology

## 2018-09-20 VITALS — BP 108/74 | HR 99 | Temp 98.2°F | Wt 241.0 lb

## 2018-09-20 DIAGNOSIS — E876 Hypokalemia: Secondary | ICD-10-CM | POA: Insufficient documentation

## 2018-09-20 DIAGNOSIS — C50919 Malignant neoplasm of unspecified site of unspecified female breast: Secondary | ICD-10-CM | POA: Insufficient documentation

## 2018-09-20 DIAGNOSIS — E860 Dehydration: Secondary | ICD-10-CM

## 2018-09-20 DIAGNOSIS — Z5111 Encounter for antineoplastic chemotherapy: Secondary | ICD-10-CM

## 2018-09-20 DIAGNOSIS — Z79899 Other long term (current) drug therapy: Secondary | ICD-10-CM | POA: Insufficient documentation

## 2018-09-20 LAB — CBC WITH DIFFERENTIAL/PLATELET
Abs Immature Granulocytes: 0.07 10*3/uL (ref 0.00–0.07)
Basophils Absolute: 0 10*3/uL (ref 0.0–0.1)
Basophils Relative: 1 %
Eosinophils Absolute: 0.1 10*3/uL (ref 0.0–0.5)
Eosinophils Relative: 3 %
HCT: 31.9 % — ABNORMAL LOW (ref 36.0–46.0)
Hemoglobin: 10.8 g/dL — ABNORMAL LOW (ref 12.0–15.0)
Immature Granulocytes: 2 %
Lymphocytes Relative: 12 %
Lymphs Abs: 0.4 10*3/uL — ABNORMAL LOW (ref 0.7–4.0)
MCH: 33.3 pg (ref 26.0–34.0)
MCHC: 33.9 g/dL (ref 30.0–36.0)
MCV: 98.5 fL (ref 80.0–100.0)
Monocytes Absolute: 0.4 10*3/uL (ref 0.1–1.0)
Monocytes Relative: 13 %
Neutro Abs: 2.2 10*3/uL (ref 1.7–7.7)
Neutrophils Relative %: 69 %
Platelets: 218 10*3/uL (ref 150–400)
RBC: 3.24 MIL/uL — ABNORMAL LOW (ref 3.87–5.11)
RDW: 14.4 % (ref 11.5–15.5)
WBC: 3.2 10*3/uL — ABNORMAL LOW (ref 4.0–10.5)
nRBC: 0.6 % — ABNORMAL HIGH (ref 0.0–0.2)

## 2018-09-20 LAB — COMPREHENSIVE METABOLIC PANEL
ALT: 48 U/L — ABNORMAL HIGH (ref 0–44)
AST: 48 U/L — ABNORMAL HIGH (ref 15–41)
Albumin: 3.6 g/dL (ref 3.5–5.0)
Alkaline Phosphatase: 50 U/L (ref 38–126)
Anion gap: 10 (ref 5–15)
BUN: 14 mg/dL (ref 8–23)
CO2: 25 mmol/L (ref 22–32)
Calcium: 8.8 mg/dL — ABNORMAL LOW (ref 8.9–10.3)
Chloride: 101 mmol/L (ref 98–111)
Creatinine, Ser: 1.13 mg/dL — ABNORMAL HIGH (ref 0.44–1.00)
GFR calc Af Amer: 57 mL/min — ABNORMAL LOW (ref >60)
GFR calc non Af Amer: 49 mL/min — ABNORMAL LOW (ref >60)
Glucose, Bld: 206 mg/dL — ABNORMAL HIGH (ref 70–99)
Potassium: 3.2 mmol/L — ABNORMAL LOW (ref 3.5–5.1)
Sodium: 136 mmol/L (ref 135–145)
Total Bilirubin: 1 mg/dL (ref 0.3–1.2)
Total Protein: 6.4 g/dL — ABNORMAL LOW (ref 6.5–8.1)

## 2018-09-20 LAB — MAGNESIUM: Magnesium: 1.7 mg/dL (ref 1.7–2.4)

## 2018-09-20 MED ORDER — POTASSIUM CHLORIDE 20 MEQ/100ML IV SOLN
20.0000 meq | Freq: Once | INTRAVENOUS | Status: AC
  Administered 2018-09-20: 11:00:00 20 meq via INTRAVENOUS

## 2018-09-20 MED ORDER — DIPHENHYDRAMINE HCL 50 MG/ML IJ SOLN
50.0000 mg | Freq: Once | INTRAMUSCULAR | Status: AC
  Administered 2018-09-20: 50 mg via INTRAVENOUS
  Filled 2018-09-20: qty 1

## 2018-09-20 MED ORDER — FAMOTIDINE IN NACL 20-0.9 MG/50ML-% IV SOLN
20.0000 mg | Freq: Once | INTRAVENOUS | Status: AC
  Administered 2018-09-20: 20 mg via INTRAVENOUS
  Filled 2018-09-20: qty 50

## 2018-09-20 MED ORDER — SODIUM CHLORIDE 0.9 % IV SOLN
Freq: Once | INTRAVENOUS | Status: AC
  Administered 2018-09-20: 10:00:00 via INTRAVENOUS
  Filled 2018-09-20: qty 250

## 2018-09-20 MED ORDER — SODIUM CHLORIDE 0.9 % IV SOLN
20.0000 mg | Freq: Once | INTRAVENOUS | Status: AC
  Administered 2018-09-20: 20 mg via INTRAVENOUS
  Filled 2018-09-20: qty 2

## 2018-09-20 MED ORDER — HEPARIN SOD (PORK) LOCK FLUSH 100 UNIT/ML IV SOLN
500.0000 [IU] | Freq: Once | INTRAVENOUS | Status: AC | PRN
  Administered 2018-09-20: 500 [IU]
  Filled 2018-09-20: qty 5

## 2018-09-20 MED ORDER — SODIUM CHLORIDE 0.9% FLUSH
10.0000 mL | Freq: Once | INTRAVENOUS | Status: AC
  Administered 2018-09-20: 10 mL via INTRAVENOUS
  Filled 2018-09-20: qty 10

## 2018-09-20 MED ORDER — LORAZEPAM 2 MG/ML IJ SOLN
1.0000 mg | Freq: Once | INTRAMUSCULAR | Status: AC | PRN
  Administered 2018-09-20: 1 mg via INTRAVENOUS
  Filled 2018-09-20: qty 1

## 2018-09-20 MED ORDER — SODIUM CHLORIDE 0.9 % IV SOLN
60.0000 mg/m2 | Freq: Once | INTRAVENOUS | Status: AC
  Administered 2018-09-20: 132 mg via INTRAVENOUS
  Filled 2018-09-20: qty 22

## 2018-09-21 ENCOUNTER — Encounter: Payer: Self-pay | Admitting: Oncology

## 2018-09-21 NOTE — Progress Notes (Signed)
Hematology/Oncology progress note [facility] [phone] [phone]   Patient Care Team: Sparks, Jeffrey D, MD as PCP - General (Internal Medicine) Byrnett, Jeffrey W, MD as Consulting Physician (General Surgery)  REFERRING PROVIDER: Sparks, Jeffrey D, MD REASON FOR VISIT:  Follow up for adjuvant chemotherapy for breast cancer.   HISTORY OF PRESENTING ILLNESS:  Megan Harris is a  68 y.o.  female present for management of  breast cancer  # Stage IB left triple negative breast cancer   03/27/2018 which showed left breast mass. She had diagnostic and ultrasound on 04/07/2018 which showed 0.9cm x 0.9cm x 1.4cm irregular border mixed echotexture mass at the left breast 11 o'clock 7 cm from nipple correlating to the mammographic finding. Ultrasound of the left axilla is negative She underwent biopsy of left breast mass. 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 Estrogen receptor negative, progesterone receptor negative, HER-2 negative.  S/p left lumpectomy and sentinel lymph node biopsy. pT1pN0 05/09/2018 Baseline MUGA showed no focal wall motion abnormality of the left ventricle.  Calculated LVEF 61%  # 05/24/2018- 07/05/2018 Adjuvant Chemotherapy ddAC- # 07/19/2018 started on weekly Taxol 80 mg/m. Starting cycle 4, Taxol was decreased to 70mg/m2 # 08/23/2018 Cycle cycle 6, Taxol was decreased to 65 mg/m due to neuropathy.  INTERVAL HISTORY Megan Harris is a 68 y.o. female who has above history reviewed by me today presents for follow up visit for management of Stage IB Triple negative breast cancer, evaluation prior to chemotherapy. #Patient has finished 4 cycles of dd AC with Onpro G-CSF support. Patient is currently on weekly Taxol treatment. 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  Fingertip chemotherapy-induced numbness tingling are  stable. She is taking gabapentin 600 mg 3 times daily. Appetite is fair. Nausea, she takes Phenergan as needed and he feels that nausea is well controlled. During last infusion, she felt nausea and was given Ativan as premed.  She feels that significantly helped her nausea Electrolyte imbalance, she is taking potassium and magnesium supplementations.     Review of Systems  Constitutional: Positive for fatigue. Negative for appetite change, chills, fever and unexpected weight change.  HENT:   Negative for hearing loss and voice change.   Eyes: Negative for eye problems.  Respiratory: Negative for chest tightness and cough.   Cardiovascular: Negative for chest pain.  Gastrointestinal: Positive for nausea. Negative for abdominal distention, abdominal pain and blood in stool.  Endocrine: Negative for hot flashes.  Genitourinary: Negative for difficulty urinating and frequency.   Musculoskeletal: Negative for arthralgias.  Skin: Negative for itching and rash.  Neurological: Positive for numbness. Negative for extremity weakness.  Hematological: Negative for adenopathy.  Psychiatric/Behavioral: Negative for confusion. The patient is not nervous/anxious.     MEDICAL HISTORY:  Past Medical History:  Diagnosis Date  . Abdominal pain, left lower quadrant 2012  . Anemia    vitamin d deficiency  . Arthritis   . Asthma   . Back pain 2005   lower back arthritis  . Bone cancer (HCC)   . Cancer (HCC) 04/26/2018   16mm, T1c, N0 triple negative  . Chronic cholecystitis 2012   has had choleycystectomy.  not an issue  . Constipation 2005  . Endocrine problem 2005  . Family history of bone cancer   . Family history of brain cancer   . Family history of colon cancer   . Family history of lung cancer   .   GERD (gastroesophageal reflux disease) 2012  . Hyperlipidemia 2012  . Hypertension 2002  . Nausea with vomiting 2012  . Obesity, unspecified 2012  . Pneumonia   . PONV (postoperative  nausea and vomiting)   . Special screening for malignant neoplasms, colon 2012    SURGICAL HISTORY: Past Surgical History:  Procedure Laterality Date  . BILATERAL CARPAL TUNNEL RELEASE Bilateral   . BREAST BIOPSY Left 04/13/2018   Pending Path  . BREAST EXCISIONAL BIOPSY Left early 90s   neg  . BREAST LUMPECTOMY Left 04/26/2018   Procedure: BREAST LUMPECTOMY WITH EXCISION OF SENTINEL NODE;  Surgeon: Byrnett, Jeffrey W, MD;  Location: [facility] ORS;  Service: General;  Laterality: Left;  . BUNIONECTOMY Bilateral 2003  . CHOLECYSTECTOMY  06/04/2010  . COLON RESECTION  2004   due diverticulitis   . COLONOSCOPY  2005   [facility], Dr. Byrnett  . COLONOSCOPY  06/28/2012  . COLOSTOMY  2004  . COLOSTOMY REVERSAL  2004  . FOOT SURGERY Bilateral 2012   plantar faciatis  . HAND SURGERY Bilateral    carpal tunnel  . HERNIA REPAIR  2005   at colostomy site after reversal done  . JOINT REPLACEMENT    . KNEE ARTHROPLASTY Right 04/20/2017   Procedure: COMPUTER ASSISTED TOTAL KNEE ARTHROPLASTY;  Surgeon: Hooten, James P, MD;  Location: [facility] ORS;  Service: Orthopedics;  Laterality: Right;  . KNEE ARTHROSCOPY Right 02/03/2015   Procedure: ARTHROSCOPY right knee, partial medial menisectomy, condyle malleolus, patella and femoral;  Surgeon: James P Hooten, MD;  Location: [facility] ORS;  Service: Orthopedics;  Laterality: Right;  . NASAL SINUS SURGERY     2005  . PORTACATH PLACEMENT Right 04/26/2018   Procedure: INSERTION PORT-A-CATH RIGHT;  Surgeon: Byrnett, Jeffrey W, MD;  Location: [facility] ORS;  Service: General;  Laterality: Right;  . SALPINGOOPHORECTOMY  1998  . TONSILLECTOMY    . TUBAL LIGATION  1978  . UPPER GI ENDOSCOPY  06/28/2012  . VAGINAL HYSTERECTOMY  1987    SOCIAL HISTORY: Social History   Socioeconomic History  . Marital status: Married    Spouse name: Roger  . Number of children: 2  . Years of education: Not on file  . Highest education level: Not on file  Occupational History  . 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 Financial resource strain: Not on file  . Food insecurity    Worry: Not on file    Inability: Not on file  . Transportation needs    Medical: Not on file    Non-medical: Not on file  Tobacco Use  . Smoking status: Never Smoker  . Smokeless tobacco: Never Used  Substance and Sexual Activity  . Alcohol use: Not Currently  . Drug use: No  . Sexual activity: Yes    Birth control/protection: Surgical  Lifestyle  . Physical activity    Days per week: Not on file    Minutes per session: Not on file  . Stress: Not on file  Relationships  . Social connections    Talks on phone: Not on file    Gets together: Not on file    Attends religious service: Not on file    Active member of club or organization: Not on file    Attends meetings of clubs or organizations: Not on file    Relationship status: Not on file  . Intimate partner violence    Fear of current or ex partner: Not on   file    Emotionally abused: Not on file    Physically abused: Not on file    Forced sexual activity: Not on file  Other Topics Concern  . Not on file  Social History Narrative  . Not on file   Lives at home with husband.  FAMILY HISTORY: Family History  Problem Relation Age of Onset  . Heart disease Father   . Diabetes Paternal Grandmother   . Diabetes Paternal Grandfather   . Brain cancer Mother 11  . Bone cancer Sister 71  . Colon cancer Maternal Uncle        dx 50s-60s  . Throat cancer Cousin   . Leukemia Cousin   . Lung cancer Cousin   . Ovarian cancer Neg Hx     ALLERGIES:  is allergic to codeine and penicillins.  MEDICATIONS:  Current Outpatient Medications  Medication Sig Dispense Refill  . acetaminophen (TYLENOL) 500 MG tablet Take 500 mg by mouth every 6 (six) hours as needed.    Marland Kitchen albuterol (PROVENTIL HFA;VENTOLIN HFA) 108 (90 Base) MCG/ACT inhaler Inhale 2 puffs into the lungs every 6 (six) hours as needed for wheezing  or shortness of breath.     Marland Kitchen amLODipine (NORVASC) 5 MG tablet Take 5 mg by mouth daily.    Marland Kitchen atorvastatin (LIPITOR) 10 MG tablet Take 10 mg by mouth daily.    . Cholecalciferol (VITAMIN D3 MAXIMUM STRENGTH) 5000 units capsule Take 5,000 Units by mouth daily.    Marland Kitchen gabapentin (NEURONTIN) 300 MG capsule TAKE 2 CAPSULES BY MOUTH 3 TIMES DAILY (Patient taking differently: Take 300 mg by mouth 4 (four) times daily. ) 180 capsule 0  . hydrochlorothiazide (HYDRODIURIL) 25 MG tablet Take 25 mg by mouth daily.    . lansoprazole (PREVACID) 30 MG capsule Take 30 mg by mouth 2 (two) times daily.     Marland Kitchen levothyroxine (SYNTHROID, LEVOTHROID) 50 MCG tablet Take 50 mcg by mouth daily before breakfast.     . lidocaine-prilocaine (EMLA) cream Apply to affected area once 30 g 3  . losartan (COZAAR) 100 MG tablet Take 100 mg by mouth daily.    . magnesium chloride (SLOW-MAG) 64 MG TBEC SR tablet Take 1 tablet (64 mg total) by mouth 2 (two) times daily. 60 tablet 0  . nystatin (MYCOSTATIN) 100000 UNIT/ML suspension TAKE 1 TEASPOONFUL (5ML) BY MOUTH 4 TIMES DAILY 473 mL 0  . potassium chloride SA (K-DUR) 20 MEQ tablet TAKE 2 TABLETS BY MOUTH DAILY 60 tablet 0  . promethazine (PHENERGAN) 25 MG tablet TAKE ONE TABLET BY MOUTH EVERY 6 HOURS AS NEEDED FOR NAUSEA / VOMITING 60 tablet 1  . valACYclovir (VALTREX) 500 MG tablet TAKE 1 TABLET BY MOUTH TWICE DAILY 70 tablet 0  . zolpidem (AMBIEN) 10 MG tablet Take 10 mg by mouth at bedtime.      No current facility-administered medications for this visit.      PHYSICAL EXAMINATION: ECOG PERFORMANCE STATUS: 0 - Asymptomatic Vitals:   09/20/18 0907  BP: 108/74  Pulse: 99  Temp: 98.2 F (36.8 C)   Filed Weights   09/20/18 0907  Weight: 241 lb (109.3 kg)    Physical Exam Constitutional:      General: She is not in acute distress.    Appearance: She is obese.  HENT:     Head: Normocephalic and atraumatic.     Mouth/Throat:     Comments: White plaque on tongue.  Eyes:     General: No scleral icterus.  Pupils: Pupils are equal, round, and reactive to light.  Neck:     Musculoskeletal: Normal range of motion and neck supple.  Cardiovascular:     Rate and Rhythm: Normal rate and regular rhythm.     Heart sounds: Normal heart sounds.  Pulmonary:     Effort: Pulmonary effort is normal. No respiratory distress.     Breath sounds: No wheezing.  Abdominal:     General: Bowel sounds are normal. There is no distension.     Palpations: Abdomen is soft. There is no mass.     Tenderness: There is no abdominal tenderness.  Musculoskeletal: Normal range of motion.        General: No deformity.  Skin:    General: Skin is warm and dry.     Findings: No erythema or rash.  Neurological:     Mental Status: She is alert and oriented to person, place, and time.     Cranial Nerves: No cranial nerve deficit.     Coordination: Coordination normal.  Psychiatric:        Behavior: Behavior normal.        Thought Content: Thought content normal.         LABORATORY DATA:  I have reviewed the data as listed Lab Results  Component Value Date   WBC 3.2 (L) 09/20/2018   HGB 10.8 (L) 09/20/2018   HCT 31.9 (L) 09/20/2018   MCV 98.5 09/20/2018   PLT 218 09/20/2018   Recent Labs    09/06/18 0815 09/13/18 0812 09/20/18 0848  NA 137 138 136  K 3.2* 3.2* 3.2*  CL 101 101 101  CO2 26 24 25  GLUCOSE 207* 135* 206*  BUN 11 11 14  CREATININE 0.82 0.79 1.13*  CALCIUM 9.0 8.6* 8.8*  GFRNONAA >60 >60 49*  GFRAA >60 >60 57*  PROT 6.7 6.3* 6.4*  ALBUMIN 3.7 3.6 3.6  AST 54* 47* 48*  ALT 53* 56* 48*  ALKPHOS 51 52 50  BILITOT 0.9 1.0 1.0   Iron/TIBC/Ferritin/ %Sat No results found for: IRON, TIBC, FERRITIN, IRONPCTSAT   Pathology 04/26/2018 CASE: [id number]  PATIENT: Ivalee League  Surgical Pathology Report DIAGNOSIS:  A. BREAST, LEFT UPPER INNER QUADRANT; WIDE EXCISION:  - INVASIVE MAMMARY CARCINOMA.  - SEE CANCER SUMMARY BELOW.  - BIOPSY SITE  CHANGE WITH HEMATOMA AND RIBBON CLIP.  - UNREMARKABLE SKIN.  B. SENTINEL LYMPH NODE #1; EXCISION:  - ONE LYMPH NODE NEGATIVE FOR MALIGNANCY (0/1).  C. NEW BREAST MARGIN, SUPERIOR; EXCISION:  - BENIGN BREAST TISSUE WITH BIOPSY CHANGE AND HEMATOMA.  - NEGATIVE FOR MALIGNANCY.  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, without comedonecrosis  Margins:    Invasive Carcinoma Margins: Uninvolved by invasive carcinoma            Distance from closest margin: 1.7 mm            Specify closest margin: Anterior         DCIS Margins: Uninvolved by DCIS            Distance from closest margin: 1.2 mm            Specify closest margin: Anterior  Regional Lymph Nodes: Uninvolved by tumor cells    Total Number of Lymph Nodes Examined: 1       Number of Sentinel Nodes Examined: 1  Treatment Effect in the Breast: No known presurgical therapy  Lymphovascular Invasion: Not identified  Pathologic Stage Classification (pTNM, AJCC 8th Edition): pT1c pN0 (sn)   Breast Biomarker Testing Performed on Previous Biopsy: [id number]  Estrogen Receptor (ER) Status: NEGATIVE  Progesterone Receptor (PgR) Status: NEGATIVE  HER2 (by immunohistochemistry): NEGATIVE     ASSESSMENT & PLAN:  No diagnosis found.Cancer Staging 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 [facility]) Staging form: Breast, AJCC 8th Edition - Clinical stage from 04/28/2018: Stage IB (cT1c, cN0, cM0, G3, ER-, PR-, HER2-) - Signed by Earlie Server, MD on 04/29/2018 - Pathologic stage from 05/25/2018: Stage IB (pT1c, pN0(sn), cM0, G3, ER-, PR-, HER2-) - Signed by Earlie Server, MD on 05/25/2018  # Stage IB left triple negative breast cancer S/p left lumpectomy and sentinel lymph node biopsy.  Currently on weekly Taxol 21m/m2 treatment. Clinically tolerating. Labs were reviewed and discussed with patient. Counts acceptable to proceed with Taxol 60 mg/m.    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 Reports symptoms are stable.  Continue gabapentin 600 mg 3 times a day.  #Grade 1 transaminitis, labs are reviewed.  Stable.  Continue to monitor. #Shingle during chemotherapy, continue prophylactic antibiotics.  Recommend patient to stay on prophylactic antibiotics until a few weeks after she finishes radiation.  #She will finish cycle 2 of Taxol on 10/04/2018.  Will refer to Dr. CBaruch Goutyto establish care for evaluation of adjuvant radiation. #Thrush prophylaxis continue nystatin swish and spit. #I was contacted by infusion nurse that patient feels nausea during infusion.  She took Phenergan 25 mg in the morning.  Still feeling nauseated .  Advised nurse to give Ativan 1 mg IV x1 as needed as premed if she feels nauseated.  Feels that there is a component of anxiety.  #Mediport was put in by Dr. BTerri Piedra  She has communicated with surgery and desires to remove Port-A-Cath after she finishes chemotherapy. Follow-up in 1 week  for evaluation prior to starting next cycle of  Taxol.  All questions were answered. The patient knows to call the clinic with any problems questions or concerns.  ZEarlie Server MD, PhD 09/21/2018

## 2018-09-27 ENCOUNTER — Inpatient Hospital Stay (HOSPITAL_BASED_OUTPATIENT_CLINIC_OR_DEPARTMENT_OTHER): Payer: PPO | Admitting: Oncology

## 2018-09-27 ENCOUNTER — Inpatient Hospital Stay: Payer: PPO

## 2018-09-27 ENCOUNTER — Other Ambulatory Visit: Payer: Self-pay

## 2018-09-27 ENCOUNTER — Encounter: Payer: Self-pay | Admitting: Oncology

## 2018-09-27 VITALS — BP 129/82 | HR 97 | Temp 97.5°F | Ht 65.0 in | Wt 242.0 lb

## 2018-09-27 DIAGNOSIS — E876 Hypokalemia: Secondary | ICD-10-CM

## 2018-09-27 DIAGNOSIS — C50919 Malignant neoplasm of unspecified site of unspecified female breast: Secondary | ICD-10-CM

## 2018-09-27 DIAGNOSIS — E860 Dehydration: Secondary | ICD-10-CM

## 2018-09-27 DIAGNOSIS — Z95828 Presence of other vascular implants and grafts: Secondary | ICD-10-CM

## 2018-09-27 DIAGNOSIS — C50912 Malignant neoplasm of unspecified site of left female breast: Secondary | ICD-10-CM

## 2018-09-27 DIAGNOSIS — Z5111 Encounter for antineoplastic chemotherapy: Secondary | ICD-10-CM | POA: Diagnosis not present

## 2018-09-27 LAB — COMPREHENSIVE METABOLIC PANEL
ALT: 50 U/L — ABNORMAL HIGH (ref 0–44)
AST: 51 U/L — ABNORMAL HIGH (ref 15–41)
Albumin: 3.6 g/dL (ref 3.5–5.0)
Alkaline Phosphatase: 52 U/L (ref 38–126)
Anion gap: 11 (ref 5–15)
BUN: 11 mg/dL (ref 8–23)
CO2: 26 mmol/L (ref 22–32)
Calcium: 8.7 mg/dL — ABNORMAL LOW (ref 8.9–10.3)
Chloride: 99 mmol/L (ref 98–111)
Creatinine, Ser: 0.81 mg/dL (ref 0.44–1.00)
GFR calc Af Amer: >60 mL/min (ref >60)
GFR calc non Af Amer: >60 mL/min (ref >60)
Glucose, Bld: 209 mg/dL — ABNORMAL HIGH (ref 70–99)
Potassium: 3.1 mmol/L — ABNORMAL LOW (ref 3.5–5.1)
Sodium: 136 mmol/L (ref 135–145)
Total Bilirubin: 0.9 mg/dL (ref 0.3–1.2)
Total Protein: 6.1 g/dL — ABNORMAL LOW (ref 6.5–8.1)

## 2018-09-27 LAB — CBC WITH DIFFERENTIAL/PLATELET
Abs Immature Granulocytes: 0.14 10*3/uL — ABNORMAL HIGH (ref 0.00–0.07)
Basophils Absolute: 0 10*3/uL (ref 0.0–0.1)
Basophils Relative: 1 %
Eosinophils Absolute: 0.1 10*3/uL (ref 0.0–0.5)
Eosinophils Relative: 3 %
HCT: 31.8 % — ABNORMAL LOW (ref 36.0–46.0)
Hemoglobin: 10.8 g/dL — ABNORMAL LOW (ref 12.0–15.0)
Immature Granulocytes: 5 %
Lymphocytes Relative: 18 %
Lymphs Abs: 0.6 10*3/uL — ABNORMAL LOW (ref 0.7–4.0)
MCH: 33.3 pg (ref 26.0–34.0)
MCHC: 34 g/dL (ref 30.0–36.0)
MCV: 98.1 fL (ref 80.0–100.0)
Monocytes Absolute: 0.3 10*3/uL (ref 0.1–1.0)
Monocytes Relative: 10 %
Neutro Abs: 2 10*3/uL (ref 1.7–7.7)
Neutrophils Relative %: 63 %
Platelets: 233 10*3/uL (ref 150–400)
RBC: 3.24 MIL/uL — ABNORMAL LOW (ref 3.87–5.11)
RDW: 13.9 % (ref 11.5–15.5)
WBC: 3.1 10*3/uL — ABNORMAL LOW (ref 4.0–10.5)
nRBC: 1 % — ABNORMAL HIGH (ref 0.0–0.2)

## 2018-09-27 LAB — MAGNESIUM: Magnesium: 1.5 mg/dL — ABNORMAL LOW (ref 1.7–2.4)

## 2018-09-27 MED ORDER — SODIUM CHLORIDE 0.9 % IV SOLN
Freq: Once | INTRAVENOUS | Status: AC
  Administered 2018-09-27: 10:00:00 via INTRAVENOUS
  Filled 2018-09-27: qty 250

## 2018-09-27 MED ORDER — HEPARIN SOD (PORK) LOCK FLUSH 100 UNIT/ML IV SOLN
500.0000 [IU] | Freq: Once | INTRAVENOUS | Status: AC
  Administered 2018-09-27: 13:00:00 500 [IU] via INTRAVENOUS
  Filled 2018-09-27: qty 5

## 2018-09-27 MED ORDER — FAMOTIDINE IN NACL 20-0.9 MG/50ML-% IV SOLN
20.0000 mg | Freq: Once | INTRAVENOUS | Status: AC
  Administered 2018-09-27: 11:00:00 20 mg via INTRAVENOUS
  Filled 2018-09-27: qty 50

## 2018-09-27 MED ORDER — SODIUM CHLORIDE 0.9% FLUSH
10.0000 mL | Freq: Once | INTRAVENOUS | Status: AC
  Administered 2018-09-27: 09:00:00 10 mL via INTRAVENOUS
  Filled 2018-09-27: qty 10

## 2018-09-27 MED ORDER — SODIUM CHLORIDE 0.9 % IV SOLN
60.0000 mg/m2 | Freq: Once | INTRAVENOUS | Status: AC
  Administered 2018-09-27: 12:00:00 132 mg via INTRAVENOUS
  Filled 2018-09-27: qty 22

## 2018-09-27 MED ORDER — PROMETHAZINE HCL 25 MG PO TABS: 25.0000 mg | ORAL_TABLET | Freq: Four times a day (QID) | ORAL | 1 refills | Status: DC | PRN

## 2018-09-27 MED ORDER — LORAZEPAM 2 MG/ML IJ SOLN
1.0000 mg | Freq: Once | INTRAMUSCULAR | Status: AC | PRN
  Administered 2018-09-27: 10:00:00 1 mg via INTRAVENOUS
  Filled 2018-09-27: qty 1

## 2018-09-27 MED ORDER — SODIUM CHLORIDE 0.9 % IV SOLN
20.0000 mg | Freq: Once | INTRAVENOUS | Status: AC
  Administered 2018-09-27: 11:00:00 20 mg via INTRAVENOUS
  Filled 2018-09-27: qty 2

## 2018-09-27 MED ORDER — SODIUM CHLORIDE 0.9 % IV SOLN
Freq: Once | INTRAVENOUS | Status: AC
  Administered 2018-09-27: 12:00:00 via INTRAVENOUS
  Filled 2018-09-27: qty 10

## 2018-09-27 MED ORDER — DIPHENHYDRAMINE HCL 50 MG/ML IJ SOLN
50.0000 mg | Freq: Once | INTRAMUSCULAR | Status: AC
  Administered 2018-09-27: 10:00:00 50 mg via INTRAVENOUS
  Filled 2018-09-27: qty 1

## 2018-09-27 NOTE — Progress Notes (Signed)
Hematology/Oncology progress note [facility] [phone] [phone]   Patient Care Team: Idelle Crouch, MD as PCP - General (Internal Medicine) Bary Castilla Forest Gleason, MD as Consulting Physician (General Surgery)  REFERRING PROVIDER: Idelle Crouch, MD REASON FOR VISIT:  Follow up for adjuvant chemotherapy for breast cancer.   HISTORY OF PRESENTING ILLNESS:  Megan Harris is a  68 y.o.  female present for management of  breast cancer  # Stage IB left triple negative breast cancer   03/27/2018 which showed left breast mass. She had diagnostic and ultrasound on 04/07/2018 which showed 0.9cm x 0.9cm x 1.4cm irregular border mixed echotexture mass at the left breast 11 o'clock 7 cm from nipple correlating to the mammographic finding. Ultrasound of the left axilla is negative She underwent biopsy of left breast mass. 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 Estrogen receptor negative, progesterone receptor negative, HER-2 negative.  S/p left lumpectomy and sentinel lymph node biopsy. pT1pN0 05/09/2018 Baseline MUGA showed no focal wall motion abnormality of the left ventricle.  Calculated LVEF 61%  # 05/24/2018- 07/05/2018 Adjuvant Chemotherapy ddAC- # 07/19/2018 started on weekly Taxol 80 mg/m. Starting cycle 4, Taxol was decreased to '70mg'$ /m2 # 08/23/2018 Cycle cycle 6, Taxol was decreased to 65 mg/m due to neuropathy.   #Patient was recommended for genetic testing and she declined.  INTERVAL HISTORY Megan Harris is a 68 y.o. female who has above history reviewed by me today presents for follow up visit for management of Stage IB Triple negative breast cancer, evaluation prior to chemotherapy. #Patient has finished 4 cycles of dd AC with Onpro G-CSF support. Patient is currently on weekly Taxol treatments. 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 Fingertip chemotherapy-induced numbness tingling are stable Patient takes gabapentin 600 mg 3 times daily .  Appetite is fair peer Nausea is stable, Phenergan as needed. Electrolyte imbalance, she is taking potassium chloride 20 mEq twice daily and Slow-Mag 20 mg twice daily    Review of Systems  Constitutional: Positive for fatigue. Negative for appetite change, chills, fever and unexpected weight change.  HENT:   Negative for hearing loss and voice change.   Eyes: Negative for eye problems.  Respiratory: Negative for chest tightness and cough.   Cardiovascular: Negative for chest pain.  Gastrointestinal: Positive for nausea. Negative for abdominal distention, abdominal pain and blood in stool.  Endocrine: Negative for hot flashes.  Genitourinary: Negative for difficulty urinating and frequency.   Musculoskeletal: Negative for arthralgias.  Skin: Negative for itching and rash.  Neurological: Positive for numbness. Negative for extremity weakness.  Hematological: Negative for adenopathy.  Psychiatric/Behavioral: Negative for confusion. The patient is not nervous/anxious.     MEDICAL HISTORY:  Past Medical History:  Diagnosis Date  . Abdominal pain, left lower quadrant 2012  . Anemia    vitamin d deficiency  . Arthritis   . Asthma   . Back pain 2005   lower back arthritis  . Bone cancer ([place])   . Cancer ([place]) 04/26/2018   35m, T1c, N0 triple negative  . Chronic cholecystitis 2012   has had choleycystectomy.  not an issue  . Constipation 2005  . Endocrine problem 2005  . Family history of bone cancer   . Family history of brain cancer   . Family history of colon cancer   . Family history of lung cancer   . GERD (gastroesophageal reflux disease) 2012  .  Hyperlipidemia 2012  . Hypertension 2002  . Nausea with vomiting 2012  . Obesity, unspecified 2012  . Pneumonia   . PONV (postoperative nausea and vomiting)   . Special screening for malignant neoplasms,  colon 2012    SURGICAL HISTORY: Past Surgical History:  Procedure Laterality Date  . BILATERAL CARPAL TUNNEL RELEASE Bilateral   . BREAST BIOPSY Left 04/13/2018   Pending Path  . BREAST EXCISIONAL BIOPSY Left early 90s   neg  . BREAST LUMPECTOMY Left 04/26/2018   Procedure: BREAST LUMPECTOMY WITH EXCISION OF SENTINEL NODE;  Surgeon: Robert Bellow, MD;  Location: [facility] ORS;  Service: General;  Laterality: Left;  . BUNIONECTOMY Bilateral 2003  . CHOLECYSTECTOMY  06/04/2010  . COLON RESECTION  2004   due diverticulitis   . COLONOSCOPY  2005   [facility], Dr. Bary Castilla  . COLONOSCOPY  06/28/2012  . COLOSTOMY  2004  . COLOSTOMY REVERSAL  2004  . FOOT SURGERY Bilateral 2012   plantar faciatis  . HAND SURGERY Bilateral    carpal tunnel  . HERNIA REPAIR  2005   at colostomy site after reversal done  . JOINT REPLACEMENT    . KNEE ARTHROPLASTY Right 04/20/2017   Procedure: COMPUTER ASSISTED TOTAL KNEE ARTHROPLASTY;  Surgeon: Dereck Leep, MD;  Location: [facility] ORS;  Service: Orthopedics;  Laterality: Right;  . KNEE ARTHROSCOPY Right 02/03/2015   Procedure: ARTHROSCOPY right knee, partial medial menisectomy, condyle malleolus, patella and femoral;  Surgeon: Dereck Leep, MD;  Location: [facility] ORS;  Service: Orthopedics;  Laterality: Right;  . NASAL SINUS SURGERY     2005  . PORTACATH PLACEMENT Right 04/26/2018   Procedure: INSERTION PORT-A-CATH RIGHT;  Surgeon: Robert Bellow, MD;  Location: [facility] ORS;  Service: General;  Laterality: Right;  . SALPINGOOPHORECTOMY  1998  . TONSILLECTOMY    . TUBAL LIGATION  1978  . UPPER GI ENDOSCOPY  06/28/2012  . VAGINAL HYSTERECTOMY  1987    SOCIAL HISTORY: Social History   Socioeconomic History  . Marital status: Married    Spouse name: Francee Piccolo  . Number of children: 2  . Years of education: Not on file  . Highest education level: Not on file  Occupational History  . 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  Financial resource strain: Not on file  . Food insecurity    Worry: Not on file    Inability: Not on file  . Transportation needs    Medical: Not on file    Non-medical: Not on file  Tobacco Use  . Smoking status: Never Smoker  . Smokeless tobacco: Never Used  Substance and Sexual Activity  . Alcohol use: Not Currently  . Drug use: No  . Sexual activity: Yes    Birth control/protection: Surgical  Lifestyle  . Physical activity    Days per week: Not on file    Minutes per session: Not on file  . Stress: Not on file  Relationships  . Social Herbalist on phone: Not on file    Gets together: Not on file    Attends religious service: Not on file    Active member of club or organization: Not on file    Attends meetings of clubs or organizations: Not on file    Relationship status: Not on file  . Intimate partner violence    Fear of current or ex partner: Not on file    Emotionally abused: Not  on file    Physically abused: Not on file    Forced sexual activity: Not on file  Other Topics Concern  . Not on file  Social History Narrative  . Not on file   Lives at home with husband.  FAMILY HISTORY: Family History  Problem Relation Age of Onset  . Heart disease Father   . Diabetes Paternal Grandmother   . Diabetes Paternal Grandfather   . Brain cancer Mother 28  . Bone cancer Sister 97  . Colon cancer Maternal Uncle        dx 50s-60s  . Throat cancer Cousin   . Leukemia Cousin   . Lung cancer Cousin   . Ovarian cancer Neg Hx     ALLERGIES:  is allergic to codeine and penicillins.  MEDICATIONS:  Current Outpatient Medications  Medication Sig Dispense Refill  . acetaminophen (TYLENOL) 500 MG tablet Take 500 mg by mouth every 6 (six) hours as needed.    Marland Kitchen albuterol (PROVENTIL HFA;VENTOLIN HFA) 108 (90 Base) MCG/ACT inhaler Inhale 2 puffs into the lungs every 6 (six) hours as needed for wheezing or shortness of breath.     Marland Kitchen amLODipine (NORVASC) 5 MG tablet  Take 5 mg by mouth daily.    Marland Kitchen atorvastatin (LIPITOR) 10 MG tablet Take 10 mg by mouth daily.    . Cholecalciferol (VITAMIN D3 MAXIMUM STRENGTH) 5000 units capsule Take 5,000 Units by mouth daily.    Marland Kitchen gabapentin (NEURONTIN) 300 MG capsule TAKE 2 CAPSULES BY MOUTH 3 TIMES DAILY (Patient taking differently: Take 300 mg by mouth 4 (four) times daily. ) 180 capsule 0  . hydrochlorothiazide (HYDRODIURIL) 25 MG tablet Take 25 mg by mouth daily.    . lansoprazole (PREVACID) 30 MG capsule Take 30 mg by mouth 2 (two) times daily.     Marland Kitchen levothyroxine (SYNTHROID, LEVOTHROID) 50 MCG tablet Take 50 mcg by mouth daily before breakfast.     . lidocaine-prilocaine (EMLA) cream Apply to affected area once 30 g 3  . losartan (COZAAR) 100 MG tablet Take 100 mg by mouth daily.    . magnesium chloride (SLOW-MAG) 64 MG TBEC SR tablet Take 1 tablet (64 mg total) by mouth 2 (two) times daily. 60 tablet 0  . nystatin (MYCOSTATIN) 100000 UNIT/ML suspension TAKE 1 TEASPOONFUL (5ML) BY MOUTH 4 TIMES DAILY 473 mL 0  . potassium chloride SA (K-DUR) 20 MEQ tablet TAKE 2 TABLETS BY MOUTH DAILY 60 tablet 0  . promethazine (PHENERGAN) 25 MG tablet Take 1 tablet (25 mg total) by mouth every 6 (six) hours as needed for nausea or vomiting. 60 tablet 1  . valACYclovir (VALTREX) 500 MG tablet TAKE 1 TABLET BY MOUTH TWICE DAILY 70 tablet 0  . zolpidem (AMBIEN) 10 MG tablet Take 10 mg by mouth at bedtime.      No current facility-administered medications for this visit.      PHYSICAL EXAMINATION: ECOG PERFORMANCE STATUS: 0 - Asymptomatic Vitals:   09/27/18 0901  BP: 129/82  Pulse: 97  Temp: (!) 97.5 F (36.4 C)   Filed Weights   09/27/18 0901  Weight: 242 lb (109.8 kg)    Physical Exam Constitutional:      General: She is not in acute distress.    Appearance: She is obese.  HENT:     Head: Normocephalic and atraumatic.     Mouth/Throat:     Comments: White plaque on tongue. Eyes:     General: No scleral icterus.     Pupils:  Pupils are equal, round, and reactive to light.  Neck:     Musculoskeletal: Normal range of motion and neck supple.  Cardiovascular:     Rate and Rhythm: Normal rate and regular rhythm.     Heart sounds: Normal heart sounds.  Pulmonary:     Effort: Pulmonary effort is normal. No respiratory distress.     Breath sounds: No wheezing.  Abdominal:     General: Bowel sounds are normal. There is no distension.     Palpations: Abdomen is soft. There is no mass.     Tenderness: There is no abdominal tenderness.  Musculoskeletal: Normal range of motion.        General: No deformity.  Skin:    General: Skin is warm and dry.     Findings: No erythema or rash.  Neurological:     Mental Status: She is alert and oriented to person, place, and time.     Cranial Nerves: No cranial nerve deficit.     Coordination: Coordination normal.  Psychiatric:        Behavior: Behavior normal.        Thought Content: Thought content normal.         LABORATORY DATA:  I have reviewed the data as listed Lab Results  Component Value Date   WBC 3.1 (L) 09/27/2018   HGB 10.8 (L) 09/27/2018   HCT 31.8 (L) 09/27/2018   MCV 98.1 09/27/2018   PLT 233 09/27/2018   Recent Labs    09/13/18 0812 09/20/18 0848 09/27/18 0849  NA 138 136 136  K 3.2* 3.2* 3.1*  CL 101 101 99  CO2 24 25 26   GLUCOSE 135* 206* 209*  BUN 11 14 11   CREATININE 0.79 1.13* 0.81  CALCIUM 8.6* 8.8* 8.7*  GFRNONAA >60 49* >60  GFRAA >60 57* >60  PROT 6.3* 6.4* 6.1*  ALBUMIN 3.6 3.6 3.6  AST 47* 48* 51*  ALT 56* 48* 50*  ALKPHOS 52 50 52  BILITOT 1.0 1.0 0.9   Iron/TIBC/Ferritin/ %Sat No results found for: IRON, TIBC, FERRITIN, IRONPCTSAT   Pathology 04/26/2018 CASE: [id number]  PATIENT: Clovis Cao  Surgical Pathology Report DIAGNOSIS:  A. BREAST, LEFT UPPER INNER QUADRANT; WIDE EXCISION:  - INVASIVE MAMMARY CARCINOMA.  - SEE CANCER SUMMARY BELOW.  - BIOPSY SITE CHANGE WITH HEMATOMA AND RIBBON CLIP.   - UNREMARKABLE SKIN.  B. SENTINEL LYMPH NODE #1; EXCISION:  - ONE LYMPH NODE NEGATIVE FOR MALIGNANCY (0/1).  C. NEW BREAST MARGIN, SUPERIOR; EXCISION:  - BENIGN BREAST TISSUE WITH BIOPSY CHANGE AND HEMATOMA.  - NEGATIVE FOR MALIGNANCY.  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, without comedonecrosis  Margins:    Invasive Carcinoma Margins: Uninvolved by invasive carcinoma            Distance from closest margin: 1.7 mm            Specify closest margin: Anterior  DCIS Margins: Uninvolved by DCIS            Distance from closest margin: 1.2 mm            Specify closest margin: Anterior  Regional Lymph Nodes: Uninvolved by tumor cells    Total Number of Lymph Nodes Examined: 1       Number of Sentinel Nodes Examined: 1  Treatment Effect in the Breast: No known presurgical therapy  Lymphovascular Invasion: Not identified  Pathologic Stage Classification (pTNM, AJCC 8th Edition): pT1c pN0 (sn)   Breast Biomarker Testing Performed on Previous Biopsy: [id number]  Estrogen Receptor (ER) Status: NEGATIVE  Progesterone Receptor (PgR) Status: NEGATIVE  HER2 (by immunohistochemistry): NEGATIVE     ASSESSMENT & PLAN:  1. Malignant neoplasm of left female breast, unspecified estrogen receptor status, unspecified site of breast ([place])   Cancer Staging 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 ([place]) Staging form: Breast, AJCC 8th Edition - Clinical stage from 04/28/2018:  Stage IB (cT1c, cN0, cM0, G3, ER-, PR-, HER2-) - Signed by Earlie Server, MD on 04/29/2018 - Pathologic stage from 05/25/2018: Stage IB (pT1c, pN0(sn), cM0, G3, ER-, PR-, HER2-) - Signed by Earlie Server, MD on 05/25/2018  # Stage IB left triple negative breast cancer S/p left lumpectomy and sentinel lymph node biopsy.  Currently on weekly Taxol 36m/m2 treatment. Clinically tolerating well.  Low Reviewed and discussed with patient. Counts acceptable to proceed with Taxol 60 mg/m today. She is finishing Taxol treatment next week  #Hypokalemia with potassium level 3.1.  Hypo-magnesium level 1.5. Patient will receive IV 20 mEq potassium chloride x1 and 2 g of magnesium sulfate IV x1. 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 Reports symptoms are stable.  Continue gabapentin 600 mg 3 times daily. #Grade 1 transaminitis, secondary to chemotherapy.  Labs are reviewed.  Stable.  Continue to monitor. #Shingle prophylaxis, recommend patient to continue shingle prophylaxis until a few weeks after she finishes radiation Okay with flu shots.  #Thrush prophylaxis, continue nystatin swish and spit. She has been referred to establish care with Dr. CBaruch Goutyfor evaluation of adjuvant radiation  All questions were answered. The patient knows to call the clinic with any problems questions or concerns. Follow-up in 1 week. ZEarlie Server MD, PhD 09/27/2018

## 2018-10-03 ENCOUNTER — Encounter: Payer: Self-pay | Admitting: Oncology

## 2018-10-03 ENCOUNTER — Other Ambulatory Visit: Payer: Self-pay

## 2018-10-03 NOTE — Progress Notes (Signed)
Patient stated that she had been doing well and she is super excited that tomorrow is her last chemo therapy.

## 2018-10-04 ENCOUNTER — Inpatient Hospital Stay (HOSPITAL_BASED_OUTPATIENT_CLINIC_OR_DEPARTMENT_OTHER): Payer: PPO | Admitting: Oncology

## 2018-10-04 ENCOUNTER — Inpatient Hospital Stay: Payer: PPO

## 2018-10-04 ENCOUNTER — Other Ambulatory Visit: Payer: Self-pay

## 2018-10-04 VITALS — BP 118/78 | HR 98 | Temp 98.3°F | Wt 242.0 lb

## 2018-10-04 DIAGNOSIS — G629 Polyneuropathy, unspecified: Secondary | ICD-10-CM

## 2018-10-04 DIAGNOSIS — Z5111 Encounter for antineoplastic chemotherapy: Secondary | ICD-10-CM

## 2018-10-04 DIAGNOSIS — E876 Hypokalemia: Secondary | ICD-10-CM | POA: Diagnosis not present

## 2018-10-04 DIAGNOSIS — C50919 Malignant neoplasm of unspecified site of unspecified female breast: Secondary | ICD-10-CM

## 2018-10-04 DIAGNOSIS — B029 Zoster without complications: Secondary | ICD-10-CM | POA: Diagnosis not present

## 2018-10-04 DIAGNOSIS — B370 Candidal stomatitis: Secondary | ICD-10-CM

## 2018-10-04 DIAGNOSIS — Z95828 Presence of other vascular implants and grafts: Secondary | ICD-10-CM | POA: Diagnosis not present

## 2018-10-04 DIAGNOSIS — E860 Dehydration: Secondary | ICD-10-CM

## 2018-10-04 LAB — COMPREHENSIVE METABOLIC PANEL
ALT: 39 U/L (ref 0–44)
AST: 45 U/L — ABNORMAL HIGH (ref 15–41)
Albumin: 3.5 g/dL (ref 3.5–5.0)
Alkaline Phosphatase: 55 U/L (ref 38–126)
Anion gap: 10 (ref 5–15)
BUN: 11 mg/dL (ref 8–23)
CO2: 26 mmol/L (ref 22–32)
Calcium: 8.7 mg/dL — ABNORMAL LOW (ref 8.9–10.3)
Chloride: 99 mmol/L (ref 98–111)
Creatinine, Ser: 0.8 mg/dL (ref 0.44–1.00)
GFR calc Af Amer: >60 mL/min (ref >60)
GFR calc non Af Amer: >60 mL/min (ref >60)
Glucose, Bld: 170 mg/dL — ABNORMAL HIGH (ref 70–99)
Potassium: 3 mmol/L — ABNORMAL LOW (ref 3.5–5.1)
Sodium: 135 mmol/L (ref 135–145)
Total Bilirubin: 1.1 mg/dL (ref 0.3–1.2)
Total Protein: 6.3 g/dL — ABNORMAL LOW (ref 6.5–8.1)

## 2018-10-04 LAB — CBC WITH DIFFERENTIAL/PLATELET
Abs Immature Granulocytes: 0.19 10*3/uL — ABNORMAL HIGH (ref 0.00–0.07)
Basophils Absolute: 0.1 10*3/uL (ref 0.0–0.1)
Basophils Relative: 2 %
Eosinophils Absolute: 0.1 10*3/uL (ref 0.0–0.5)
Eosinophils Relative: 4 %
HCT: 33.5 % — ABNORMAL LOW (ref 36.0–46.0)
Hemoglobin: 11.2 g/dL — ABNORMAL LOW (ref 12.0–15.0)
Immature Granulocytes: 6 %
Lymphocytes Relative: 16 %
Lymphs Abs: 0.6 10*3/uL — ABNORMAL LOW (ref 0.7–4.0)
MCH: 32.5 pg (ref 26.0–34.0)
MCHC: 33.4 g/dL (ref 30.0–36.0)
MCV: 97.1 fL (ref 80.0–100.0)
Monocytes Absolute: 0.4 10*3/uL (ref 0.1–1.0)
Monocytes Relative: 13 %
Neutro Abs: 2 10*3/uL (ref 1.7–7.7)
Neutrophils Relative %: 59 %
Platelets: 245 10*3/uL (ref 150–400)
RBC: 3.45 MIL/uL — ABNORMAL LOW (ref 3.87–5.11)
RDW: 13.8 % (ref 11.5–15.5)
Smear Review: ADEQUATE
WBC: 3.4 10*3/uL — ABNORMAL LOW (ref 4.0–10.5)
nRBC: 0.9 % — ABNORMAL HIGH (ref 0.0–0.2)

## 2018-10-04 LAB — MAGNESIUM: Magnesium: 1.7 mg/dL (ref 1.7–2.4)

## 2018-10-04 MED ORDER — SODIUM CHLORIDE 0.9 % IV SOLN
20.0000 mg | Freq: Once | INTRAVENOUS | Status: AC
  Administered 2018-10-04: 20 mg via INTRAVENOUS
  Filled 2018-10-04: qty 2

## 2018-10-04 MED ORDER — SODIUM CHLORIDE 0.9% FLUSH
10.0000 mL | Freq: Once | INTRAVENOUS | Status: AC
  Administered 2018-10-04: 10 mL via INTRAVENOUS
  Filled 2018-10-04: qty 10

## 2018-10-04 MED ORDER — POTASSIUM CHLORIDE 20 MEQ/100ML IV SOLN
20.0000 meq | Freq: Once | INTRAVENOUS | Status: AC
  Administered 2018-10-04: 20 meq via INTRAVENOUS

## 2018-10-04 MED ORDER — LORAZEPAM 2 MG/ML IJ SOLN
1.0000 mg | Freq: Once | INTRAMUSCULAR | Status: AC | PRN
  Administered 2018-10-04: 10:00:00 1 mg via INTRAVENOUS
  Filled 2018-10-04: qty 1

## 2018-10-04 MED ORDER — SODIUM CHLORIDE 0.9 % IV SOLN
Freq: Once | INTRAVENOUS | Status: AC
  Administered 2018-10-04: 10:00:00 via INTRAVENOUS
  Filled 2018-10-04: qty 250

## 2018-10-04 MED ORDER — FAMOTIDINE IN NACL 20-0.9 MG/50ML-% IV SOLN
20.0000 mg | Freq: Once | INTRAVENOUS | Status: AC
  Administered 2018-10-04: 10:00:00 20 mg via INTRAVENOUS
  Filled 2018-10-04: qty 50

## 2018-10-04 MED ORDER — SODIUM CHLORIDE 0.9 % IV SOLN
60.0000 mg/m2 | Freq: Once | INTRAVENOUS | Status: AC
  Administered 2018-10-04: 132 mg via INTRAVENOUS
  Filled 2018-10-04: qty 22

## 2018-10-04 MED ORDER — SODIUM CHLORIDE 0.9 % IV SOLN
Freq: Once | INTRAVENOUS | Status: AC
  Administered 2018-10-04: 09:00:00 via INTRAVENOUS
  Filled 2018-10-04: qty 250

## 2018-10-04 MED ORDER — DIPHENHYDRAMINE HCL 50 MG/ML IJ SOLN
50.0000 mg | Freq: Once | INTRAMUSCULAR | Status: AC
  Administered 2018-10-04: 50 mg via INTRAVENOUS
  Filled 2018-10-04: qty 1

## 2018-10-04 MED ORDER — FLUCONAZOLE 100 MG PO TABS: ORAL_TABLET | ORAL | 0 refills | Status: DC

## 2018-10-04 MED ORDER — HEPARIN SOD (PORK) LOCK FLUSH 100 UNIT/ML IV SOLN
500.0000 [IU] | Freq: Once | INTRAVENOUS | Status: AC
  Administered 2018-10-04: 500 [IU] via INTRAVENOUS
  Filled 2018-10-04: qty 5

## 2018-10-05 NOTE — Progress Notes (Signed)
Hematology/Oncology progress note [facility] [phone] [phone]   Patient Care Team: Idelle Crouch, MD as PCP - General (Internal Medicine) Bary Castilla Forest Gleason, MD as Consulting Physician (General Surgery)  REFERRING PROVIDER: Idelle Crouch, MD REASON FOR VISIT:  Follow up for adjuvant chemotherapy for breast cancer.   HISTORY OF PRESENTING ILLNESS:  Megan Harris is a  68 y.o.  female present for management of  breast cancer  # Stage IB left triple negative breast cancer   03/27/2018 which showed left breast mass. She had diagnostic and ultrasound on 04/07/2018 which showed 0.9cm x 0.9cm x 1.4cm irregular border mixed echotexture mass at the left breast 11 o'clock 7 cm from nipple correlating to the mammographic finding. Ultrasound of the left axilla is negative She underwent biopsy of left breast mass. 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 Estrogen receptor negative, progesterone receptor negative, HER-2 negative.  S/p left lumpectomy and sentinel lymph node biopsy. pT1pN0 05/09/2018 Baseline MUGA showed no focal wall motion abnormality of the left ventricle.  Calculated LVEF 61%  # 05/24/2018- 07/05/2018 Adjuvant Chemotherapy ddAC- # 07/19/2018 started on weekly Taxol 80 mg/m. Starting cycle 4, Taxol was decreased to 5m/m2 # 08/23/2018 Cycle cycle 6, Taxol was decreased to 65 mg/m due to neuropathy.   #Patient was recommended for genetic testing and she declined.  INTERVAL HISTORY Megan BOURNis a 68y.o. female who has above history reviewed by me today presents for follow up visit for management of Stage IB Triple negative breast cancer, evaluation prior to chemotherapy. #Patient has finished 4 cycles of dd AC with Onpro G-CSF support. Patient is currently on weekly Taxol treatments. 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  Fingertip chemotherapy-induced numbness tingling are stable.  Patient takes gabapentin 600 mg 3 times daily.  Appetite is fair.  Weight has been stable. Intermittent nausea, no vomiting.  Phenergan as needed.  She reports that the Ativan prior to the chemotherapy has helped a lot for her nausea. Electrolyte imbalance, patient takes potassium chloride 20 mEq twice daily and Slow-Mag 20 mg twice daily. Denies any fever, chills, chest pain or abdominal pain.  No new complaints today.   Review of Systems  Constitutional: Positive for fatigue. Negative for appetite change, chills, fever and unexpected weight change.  HENT:   Negative for hearing loss and voice change.   Eyes: Negative for eye problems.  Respiratory: Negative for chest tightness and cough.   Cardiovascular: Negative for chest pain.  Gastrointestinal: Positive for nausea. Negative for abdominal distention, abdominal pain and blood in stool.  Endocrine: Negative for hot flashes.  Genitourinary: Negative for difficulty urinating and frequency.   Musculoskeletal: Negative for arthralgias.  Skin: Negative for itching and rash.  Neurological: Positive for numbness. Negative for extremity weakness.  Hematological: Negative for adenopathy.  Psychiatric/Behavioral: Negative for confusion. The patient is not nervous/anxious.     MEDICAL HISTORY:  Past Medical History:  Diagnosis Date  . Abdominal pain, left lower quadrant 2012  . Anemia    vitamin d deficiency  . Arthritis   . Asthma   . Back pain 2005   lower back arthritis  . [place] cancer [place]   . Cancer ([facility] 04/26/2018   120m T1c, N0 triple negative  . Chronic cholecystitis 2012   has had choleycystectomy.  not an issue  . Constipation 2005  . Endocrine problem 2005  . Family history  of bone cancer   . Family history of brain cancer   . Family history of colon cancer   . Family history of lung cancer   . GERD (gastroesophageal reflux disease) 2012  .  Hyperlipidemia 2012  . Hypertension 2002  . Nausea with vomiting 2012  . Obesity, unspecified 2012  . Pneumonia   . PONV (postoperative nausea and vomiting)   . Special screening for malignant neoplasms, colon 2012    SURGICAL HISTORY: Past Surgical History:  Procedure Laterality Date  . BILATERAL CARPAL TUNNEL RELEASE Bilateral   . BREAST BIOPSY Left 04/13/2018   Pending Path  . BREAST EXCISIONAL BIOPSY Left early 90s   neg  . BREAST LUMPECTOMY Left 04/26/2018   Procedure: BREAST LUMPECTOMY WITH EXCISION OF SENTINEL NODE;  Surgeon: Robert Bellow, MD;  Location: [facility] ORS;  Service: General;  Laterality: Left;  . BUNIONECTOMY Bilateral 2003  . CHOLECYSTECTOMY  06/04/2010  . COLON RESECTION  2004   due diverticulitis   . COLONOSCOPY  2005   [place], Dr. Bary Castilla  . COLONOSCOPY  06/28/2012  . COLOSTOMY  2004  . COLOSTOMY REVERSAL  2004  . FOOT SURGERY Bilateral 2012   plantar faciatis  . HAND SURGERY Bilateral    carpal tunnel  . HERNIA REPAIR  2005   at colostomy site after reversal done  . JOINT REPLACEMENT    . KNEE ARTHROPLASTY Right 04/20/2017   Procedure: COMPUTER ASSISTED TOTAL KNEE ARTHROPLASTY;  Surgeon: Dereck Leep, MD;  Location: [facility] ORS;  Service: Orthopedics;  Laterality: Right;  . KNEE ARTHROSCOPY Right 02/03/2015   Procedure: ARTHROSCOPY right knee, partial medial menisectomy, condyle malleolus, patella and femoral;  Surgeon: Dereck Leep, MD;  Location: [facility] ORS;  Service: Orthopedics;  Laterality: Right;  . NASAL SINUS SURGERY     2005  . PORTACATH PLACEMENT Right 04/26/2018   Procedure: INSERTION PORT-A-CATH RIGHT;  Surgeon: Robert Bellow, MD;  Location: [facility] ORS;  Service: General;  Laterality: Right;  . SALPINGOOPHORECTOMY  1998  . TONSILLECTOMY    . TUBAL LIGATION  1978  . UPPER GI ENDOSCOPY  06/28/2012  . VAGINAL HYSTERECTOMY  1987    SOCIAL HISTORY: Social History   Socioeconomic History  . Marital status: Married    Spouse name: Francee Piccolo   . Number of children: 2  . Years of education: Not on file  . Highest education level: Not on file  Occupational History  . 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 Financial resource strain: Not on file  . Food insecurity    Worry: Not on file    Inability: Not on file  . Transportation needs    Medical: Not on file    Non-medical: Not on file  Tobacco Use  . Smoking status: Never Smoker  . Smokeless tobacco: Never Used  Substance and Sexual Activity  . Alcohol use: Not Currently  . Drug use: No  . Sexual activity: Yes    Birth control/protection: Surgical  Lifestyle  . Physical activity    Days per week: Not on file    Minutes per session: Not on file  . Stress: Not on file  Relationships  . Social Herbalist on phone: Not on file    Gets together: Not on file    Attends religious service: Not on file    Active member of club or organization: Not on file    Attends meetings of  clubs or organizations: Not on file    Relationship status: Not on file  . Intimate partner violence    Fear of current or ex partner: Not on file    Emotionally abused: Not on file    Physically abused: Not on file    Forced sexual activity: Not on file  Other Topics Concern  . Not on file  Social History Narrative  . Not on file   Lives at home with husband.  FAMILY HISTORY: Family History  Problem Relation Age of Onset  . Heart disease Father   . Diabetes Paternal Grandmother   . Diabetes Paternal Grandfather   . Brain cancer Mother 2  . Bone cancer Sister 43  . Colon cancer Maternal Uncle        dx 50s-60s  . Throat cancer Cousin   . Leukemia Cousin   . Lung cancer Cousin   . Ovarian cancer Neg Hx     ALLERGIES:  is allergic to codeine and penicillins.  MEDICATIONS:  Current Outpatient Medications  Medication Sig Dispense Refill  . acetaminophen (TYLENOL) 500 MG tablet Take 500 mg by mouth every 6 (six) hours as needed.    Marland Kitchen  albuterol (PROVENTIL HFA;VENTOLIN HFA) 108 (90 Base) MCG/ACT inhaler Inhale 2 puffs into the lungs every 6 (six) hours as needed for wheezing or shortness of breath.     Marland Kitchen amLODipine (NORVASC) 5 MG tablet Take 5 mg by mouth daily.    Marland Kitchen atorvastatin (LIPITOR) 10 MG tablet Take 10 mg by mouth daily.    . Cholecalciferol (VITAMIN D3 MAXIMUM STRENGTH) 5000 units capsule Take 5,000 Units by mouth daily.    Marland Kitchen gabapentin (NEURONTIN) 300 MG capsule TAKE 2 CAPSULES BY MOUTH 3 TIMES DAILY (Patient taking differently: Take 300 mg by mouth 4 (four) times daily. ) 180 capsule 0  . hydrochlorothiazide (HYDRODIURIL) 25 MG tablet Take 25 mg by mouth daily.    . lansoprazole (PREVACID) 30 MG capsule Take 30 mg by mouth 2 (two) times daily.     Marland Kitchen levothyroxine (SYNTHROID, LEVOTHROID) 50 MCG tablet Take 50 mcg by mouth daily before breakfast.     . lidocaine-prilocaine (EMLA) cream Apply to affected area once 30 g 3  . losartan (COZAAR) 100 MG tablet Take 100 mg by mouth daily.    . magnesium chloride (SLOW-MAG) 64 MG TBEC SR tablet Take 1 tablet (64 mg total) by mouth 2 (two) times daily. 60 tablet 0  . nystatin (MYCOSTATIN) 100000 UNIT/ML suspension TAKE 1 TEASPOONFUL (5ML) BY MOUTH 4 TIMES DAILY 473 mL 0  . potassium chloride SA (K-DUR) 20 MEQ tablet TAKE 2 TABLETS BY MOUTH DAILY 60 tablet 0  . promethazine (PHENERGAN) 25 MG tablet Take 1 tablet (25 mg total) by mouth every 6 (six) hours as needed for nausea or vomiting. 60 tablet 1  . valACYclovir (VALTREX) 500 MG tablet TAKE 1 TABLET BY MOUTH TWICE DAILY 70 tablet 0  . zolpidem (AMBIEN) 10 MG tablet Take 10 mg by mouth at bedtime.     . fluconazole (DIFLUCAN) 100 MG tablet Take 265m on Day 1, then take 1058mevery day 8 tablet 0   No current facility-administered medications for this visit.      PHYSICAL EXAMINATION: ECOG PERFORMANCE STATUS: 1 - Symptomatic but completely ambulatory Vitals:   10/04/18 0836  BP: 118/78  Pulse: 98  Temp: 98.3 F (36.8  C)   Filed Weights   10/04/18 0836  Weight: 242 lb (109.8 kg)  Physical Exam Constitutional:      General: She is not in acute distress.    Appearance: She is obese.  HENT:     Head: Normocephalic and atraumatic.     Mouth/Throat:     Comments: White plaque on tongue. Eyes:     General: No scleral icterus.    Pupils: Pupils are equal, round, and reactive to light.  Neck:     Musculoskeletal: Normal range of motion and neck supple.  Cardiovascular:     Rate and Rhythm: Normal rate and regular rhythm.     Heart sounds: Normal heart sounds.  Pulmonary:     Effort: Pulmonary effort is normal. No respiratory distress.     Breath sounds: No wheezing.  Abdominal:     General: Bowel sounds are normal. There is no distension.     Palpations: Abdomen is soft. There is no mass.     Tenderness: There is no abdominal tenderness.  Musculoskeletal: Normal range of motion.        General: No deformity.  Skin:    General: Skin is warm and dry.     Findings: No erythema or rash.     Comments: Discoloration of nails  Neurological:     Mental Status: She is alert and oriented to person, place, and time.     Cranial Nerves: No cranial nerve deficit.     Coordination: Coordination normal.  Psychiatric:        Behavior: Behavior normal.        Thought Content: Thought content normal.         LABORATORY DATA:  I have reviewed the data as listed Lab Results  Component Value Date   WBC 3.4 (L) 10/04/2018   HGB 11.2 (L) 10/04/2018   HCT 33.5 (L) 10/04/2018   MCV 97.1 10/04/2018   PLT 245 10/04/2018   Recent Labs    09/20/18 0848 09/27/18 0849 10/04/18 0817  NA 136 136 135  K 3.2* 3.1* 3.0*  CL 101 99 99  CO2 25 26 26   GLUCOSE 206* 209* 170*  BUN 14 11 11   CREATININE 1.13* 0.81 0.80  CALCIUM 8.8* 8.7* 8.7*  GFRNONAA 49* >60 >60  GFRAA 57* >60 >60  PROT 6.4* 6.1* 6.3*  ALBUMIN 3.6 3.6 3.5  AST 48* 51* 45*  ALT 48* 50* 39  ALKPHOS 50 52 55  BILITOT 1.0 0.9 1.1    Iron/TIBC/Ferritin/ %Sat No results found for: IRON, TIBC, FERRITIN, IRONPCTSAT   Pathology 04/26/2018 CASE: [id number]  PATIENT: Clovis Cao  Surgical Pathology Report DIAGNOSIS:  A. BREAST, LEFT UPPER INNER QUADRANT; WIDE EXCISION:  - INVASIVE MAMMARY CARCINOMA.  - SEE CANCER SUMMARY BELOW.  - BIOPSY SITE CHANGE WITH HEMATOMA AND RIBBON CLIP.  - UNREMARKABLE SKIN.  B. SENTINEL LYMPH NODE #1; EXCISION:  - ONE LYMPH NODE NEGATIVE FOR MALIGNANCY (0/1).  C. NEW BREAST MARGIN, SUPERIOR; EXCISION:  - BENIGN BREAST TISSUE WITH BIOPSY CHANGE AND HEMATOMA.  - NEGATIVE FOR MALIGNANCY.  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, without comedonecrosis  Margins:    Invasive Carcinoma Margins: Uninvolved by invasive carcinoma            Distance from closest margin: 1.7 mm            Specify closest margin: Anterior       DCIS Margins: Uninvolved by DCIS            Distance from closest margin: 1.2 mm            Specify closest margin: Anterior  Regional Lymph Nodes: Uninvolved by tumor cells    Total Number of Lymph Nodes Examined: 1       Number of Sentinel Nodes Examined: 1  Treatment Effect in the Breast: No known presurgical therapy  Lymphovascular Invasion: Not identified  Pathologic Stage Classification (pTNM, AJCC 8th Edition): pT1c pN0 (sn)   Breast Biomarker Testing Performed on Previous Biopsy: [id number]  Estrogen Receptor (ER) Status: NEGATIVE  Progesterone  Receptor (PgR) Status: NEGATIVE  HER2 (by immunohistochemistry): NEGATIVE     ASSESSMENT & PLAN:  1. 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 ([place])   2. Hypomagnesemia   3. Hypokalemia   4. Port-A-Cath in place   5. Encounter for antineoplastic chemotherapy   6. Thrush   7. Herpes zoster without complication   8. Neuropathy   Cancer Staging 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 [facility]) Staging form: Breast, AJCC 8th Edition - Clinical stage from 04/28/2018: Stage IB (cT1c, cN0, cM0, G3, ER-, PR-, HER2-) - Signed by Earlie Server, MD on 04/29/2018 - Pathologic stage from 05/25/2018: Stage IB (pT1c, pN0(sn), cM0, G3, ER-, PR-, HER2-) - Signed by Earlie Server, MD on 05/25/2018  # Stage IB left triple negative breast cancer S/p left lumpectomy and sentinel lymph node biopsy.  Currently on weekly Taxol 69m/m2 treatment-dose reduced secondary to neuropathy.   Clinically tolerating well. Labs reviewed and discussed with patient. Acceptable to proceed with Taxol 60 mg/per meter squared today. She is finishing adjuvant chemotherapy today.  We discussed about history and physical every 3 months, annual mammogram screening.  #Port-A-Cath in place, patient plans to have Mediport taken out by Dr. BBary Castillain October.  She understands that if she developed recurrence in the future she may have to have Mediport inserted again.  #Thrush, she uses nystatin oral rinse with no significant improvement. Recommend Diflucan 200 mg on day 1, followed by 100 mg daily x6 days.  #Hypokalemia with potassium level 3.0.  Hypo-magnesium level 1.7 improved. Patient will receive IV potassium chloride 20 mEq x 1. Recommend patient to continue take potassium chloride 20 mEq twice daily and Slow-Mag 1 tablet twice daily. Patient will check potassium and magnesium level every 2 weeks x2 with IV supplementation.  If needed. .Marland Kitchen#Pre-existing neuropathy on her feet and chemotherapy-induced neuropathy of bilateral fingertips.  Reports symptoms are stable.  Continue gabapentin 600 mg 3 times daily. #Grade 1 transaminitis, secondary to chemotherapy.  Stable. #Shingle prophylaxis, recommend patient to continue shingle prophylaxis until a few weeks after she finishes radiation Okay with flu shots.  She has been referred to establish care with Dr. CBaruch Goutyfor evaluation of adjuvant radiation Follow-up in 3 months All questions were answered. The patient knows to call the clinic with any problems questions or concerns. Follow-up in 1 week. ZEarlie Server MD, PhD 10/05/2018

## 2018-10-06 ENCOUNTER — Other Ambulatory Visit: Payer: Self-pay

## 2018-10-09 ENCOUNTER — Other Ambulatory Visit: Payer: Self-pay

## 2018-10-09 ENCOUNTER — Inpatient Hospital Stay: Payer: PPO

## 2018-10-09 ENCOUNTER — Inpatient Hospital Stay (HOSPITAL_BASED_OUTPATIENT_CLINIC_OR_DEPARTMENT_OTHER): Payer: PPO | Admitting: Oncology

## 2018-10-09 ENCOUNTER — Ambulatory Visit
Admission: RE | Admit: 2018-10-09 | Discharge: 2018-10-09 | Disposition: A | Discharge status: Home / Self Care | Payer: PPO | Source: Ambulatory Visit | Attending: Radiation Oncology | Admitting: Radiation Oncology

## 2018-10-09 ENCOUNTER — Encounter: Payer: Self-pay | Admitting: Oncology

## 2018-10-09 VITALS — BP 140/89 | HR 97 | Temp 98.9°F | Resp 18 | Wt 241.2 lb

## 2018-10-09 VITALS — BP 101/73 | HR 97 | Temp 98.9°F | Resp 16 | Wt 241.3 lb

## 2018-10-09 DIAGNOSIS — E785 Hyperlipidemia, unspecified: Secondary | ICD-10-CM | POA: Insufficient documentation

## 2018-10-09 DIAGNOSIS — B029 Zoster without complications: Secondary | ICD-10-CM | POA: Diagnosis not present

## 2018-10-09 DIAGNOSIS — G629 Polyneuropathy, unspecified: Secondary | ICD-10-CM | POA: Diagnosis not present

## 2018-10-09 DIAGNOSIS — B370 Candidal stomatitis: Secondary | ICD-10-CM | POA: Diagnosis not present

## 2018-10-09 DIAGNOSIS — D649 Anemia, unspecified: Secondary | ICD-10-CM | POA: Insufficient documentation

## 2018-10-09 DIAGNOSIS — M129 Arthropathy, unspecified: Secondary | ICD-10-CM | POA: Insufficient documentation

## 2018-10-09 DIAGNOSIS — K5900 Constipation, unspecified: Secondary | ICD-10-CM | POA: Diagnosis not present

## 2018-10-09 DIAGNOSIS — K219 Gastro-esophageal reflux disease without esophagitis: Secondary | ICD-10-CM | POA: Insufficient documentation

## 2018-10-09 DIAGNOSIS — I10 Essential (primary) hypertension: Secondary | ICD-10-CM | POA: Insufficient documentation

## 2018-10-09 DIAGNOSIS — Z800 Family history of malignant neoplasm of digestive organs: Secondary | ICD-10-CM | POA: Diagnosis not present

## 2018-10-09 DIAGNOSIS — C50919 Malignant neoplasm of unspecified site of unspecified female breast: Secondary | ICD-10-CM | POA: Diagnosis not present

## 2018-10-09 DIAGNOSIS — Z95828 Presence of other vascular implants and grafts: Secondary | ICD-10-CM

## 2018-10-09 DIAGNOSIS — E860 Dehydration: Secondary | ICD-10-CM

## 2018-10-09 DIAGNOSIS — Z808 Family history of malignant neoplasm of other organs or systems: Secondary | ICD-10-CM | POA: Diagnosis not present

## 2018-10-09 DIAGNOSIS — Z801 Family history of malignant neoplasm of trachea, bronchus and lung: Secondary | ICD-10-CM | POA: Insufficient documentation

## 2018-10-09 DIAGNOSIS — Z171 Estrogen receptor negative status [ER-]: Secondary | ICD-10-CM | POA: Diagnosis not present

## 2018-10-09 DIAGNOSIS — Z5111 Encounter for antineoplastic chemotherapy: Secondary | ICD-10-CM | POA: Diagnosis not present

## 2018-10-09 DIAGNOSIS — J45909 Unspecified asthma, uncomplicated: Secondary | ICD-10-CM | POA: Insufficient documentation

## 2018-10-09 DIAGNOSIS — C50212 Malignant neoplasm of upper-inner quadrant of left female breast: Secondary | ICD-10-CM | POA: Insufficient documentation

## 2018-10-09 DIAGNOSIS — Z79899 Other long term (current) drug therapy: Secondary | ICD-10-CM | POA: Insufficient documentation

## 2018-10-09 DIAGNOSIS — E876 Hypokalemia: Secondary | ICD-10-CM | POA: Diagnosis not present

## 2018-10-09 DIAGNOSIS — R109 Unspecified abdominal pain: Secondary | ICD-10-CM

## 2018-10-09 LAB — URINALYSIS, COMPLETE (UACMP) WITH MICROSCOPIC
Bilirubin Urine: NEGATIVE
Glucose, UA: NEGATIVE mg/dL
Hgb urine dipstick: NEGATIVE
Ketones, ur: NEGATIVE mg/dL
Nitrite: NEGATIVE
Protein, ur: 30 mg/dL — AB
Specific Gravity, Urine: 1.018 (ref 1.005–1.030)
pH: 6 (ref 5.0–8.0)

## 2018-10-09 LAB — COMPREHENSIVE METABOLIC PANEL
ALT: 54 U/L — ABNORMAL HIGH (ref 0–44)
AST: 46 U/L — ABNORMAL HIGH (ref 15–41)
Albumin: 3.7 g/dL (ref 3.5–5.0)
Alkaline Phosphatase: 53 U/L (ref 38–126)
Anion gap: 11 (ref 5–15)
BUN: 13 mg/dL (ref 8–23)
CO2: 27 mmol/L (ref 22–32)
Calcium: 9.1 mg/dL (ref 8.9–10.3)
Chloride: 98 mmol/L (ref 98–111)
Creatinine, Ser: 0.94 mg/dL (ref 0.44–1.00)
GFR calc Af Amer: >60 mL/min (ref >60)
GFR calc non Af Amer: >60 mL/min (ref >60)
Glucose, Bld: 127 mg/dL — ABNORMAL HIGH (ref 70–99)
Potassium: 3.8 mmol/L (ref 3.5–5.1)
Sodium: 136 mmol/L (ref 135–145)
Total Bilirubin: 0.9 mg/dL (ref 0.3–1.2)
Total Protein: 6.6 g/dL (ref 6.5–8.1)

## 2018-10-09 LAB — CBC WITH DIFFERENTIAL/PLATELET
Abs Immature Granulocytes: 0.05 10*3/uL (ref 0.00–0.07)
Basophils Absolute: 0 10*3/uL (ref 0.0–0.1)
Basophils Relative: 1 %
Eosinophils Absolute: 0.1 10*3/uL (ref 0.0–0.5)
Eosinophils Relative: 2 %
HCT: 33.9 % — ABNORMAL LOW (ref 36.0–46.0)
Hemoglobin: 11.3 g/dL — ABNORMAL LOW (ref 12.0–15.0)
Immature Granulocytes: 2 %
Lymphocytes Relative: 15 %
Lymphs Abs: 0.5 10*3/uL — ABNORMAL LOW (ref 0.7–4.0)
MCH: 32.6 pg (ref 26.0–34.0)
MCHC: 33.3 g/dL (ref 30.0–36.0)
MCV: 97.7 fL (ref 80.0–100.0)
Monocytes Absolute: 0.2 10*3/uL (ref 0.1–1.0)
Monocytes Relative: 7 %
Neutro Abs: 2.4 10*3/uL (ref 1.7–7.7)
Neutrophils Relative %: 73 %
Platelets: 246 10*3/uL (ref 150–400)
RBC: 3.47 MIL/uL — ABNORMAL LOW (ref 3.87–5.11)
RDW: 14 % (ref 11.5–15.5)
WBC: 3.3 10*3/uL — ABNORMAL LOW (ref 4.0–10.5)
nRBC: 0 % (ref 0.0–0.2)

## 2018-10-09 NOTE — Progress Notes (Signed)
Hematology/Oncology progress note [facility] Telephone:([phone] [phone]   Patient Care Team: Megan Crouch, MD as PCP - General (Internal Medicine) Megan Harris Forest Gleason, MD as Consulting Physician (General Surgery)  REFERRING PROVIDER: Idelle Crouch, MD REASON FOR VISIT:  Follow up for adjuvant chemotherapy for breast cancer.   HISTORY OF PRESENTING ILLNESS:  Megan Harris is a  69 y.o.  female present for management of  breast cancer  # Stage IB left triple negative breast cancer   03/27/2018 which showed left breast mass. She had diagnostic and ultrasound on 04/07/2018 which showed 0.9cm x 0.9cm x 1.4cm irregular border mixed echotexture mass at the left breast 11 o'clock 7 cm from nipple correlating to the mammographic finding. Ultrasound of the left axilla is negative She underwent biopsy of left breast mass. 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 Estrogen receptor negative, progesterone receptor negative, HER-2 negative.  S/p left lumpectomy and sentinel lymph node biopsy. pT1pN0 05/09/2018 Baseline MUGA showed no focal wall motion abnormality of the left ventricle.  Calculated LVEF 61%  # 05/24/2018- 07/05/2018 Adjuvant Chemotherapy ddAC- # 07/19/2018 started on weekly Taxol 80 mg/m. Starting cycle 4, Taxol was decreased to 31m/m2 # 08/23/2018 Cycle cycle 6, Taxol was decreased to 65 mg/m due to neuropathy.   #Patient was recommended for genetic testing and she declined.  INTERVAL HISTORY Megan Harris a 68y.o. female who has above history reviewed by me today presents for acute visit Patient finished adjuvant chemotherapy with ddAC x 4 followed by 12 weekly Taxol. Finished all chemotherapy last week. She has an appointment with radiation oncology and has some concern about mouth sores and new onset of left flank pain.  Pain is  constant, 4 out of 10.  Nonradiating. Denies any fever, chills, nausea, vomiting, abdominal pain dysuria.  She did mention that she urinates more frequently. Chronic neuropathy, stable.  Takes gabapentin. She has thrush, is on a course of Diflucan.   Review of Systems  Constitutional: Positive for fatigue. Negative for appetite change, chills, fever and unexpected weight change.  HENT:   Negative for hearing loss and voice change.   Eyes: Negative for eye problems.  Respiratory: Negative for chest tightness and cough.   Cardiovascular: Negative for chest pain.  Gastrointestinal: Negative for abdominal distention, abdominal pain, blood in stool and nausea.  Endocrine: Negative for hot flashes.  Genitourinary: Negative for difficulty urinating and frequency.   Musculoskeletal: Negative for arthralgias.  Skin: Negative for itching and rash.  Neurological: Positive for numbness. Negative for extremity weakness.  Hematological: Negative for adenopathy.  Psychiatric/Behavioral: Negative for confusion. The patient is not nervous/anxious.     MEDICAL HISTORY:  Past Medical History:  Diagnosis Date   Abdominal pain, left lower quadrant 2012   Anemia    vitamin d deficiency   Arthritis    Asthma    Back pain 2005   lower back arthritis   [place]   132m T1c, N0 triple negative   Chronic cholecystitis 2012   has had choleycystectomy.  not an issue   Constipation 2005   Endocrine problem 2005   Family history of bone cancer    Family history of brain cancer    Family history of colon cancer    Family history of lung cancer    GERD (gastroesophageal reflux disease) 2012   Hyperlipidemia 2012  Hypertension 2002   Nausea with vomiting 2012   Obesity, unspecified 2012   Pneumonia    PONV (postoperative nausea and vomiting)    Special screening for malignant neoplasms, colon 2012    SURGICAL HISTORY: Past Surgical  History:  Procedure Laterality Date   BILATERAL CARPAL TUNNEL RELEASE Bilateral    BREAST BIOPSY Left 04/13/2018   Pending Path   BREAST EXCISIONAL BIOPSY Left early 90s   neg   BREAST LUMPECTOMY Left 04/26/2018   Procedure: BREAST LUMPECTOMY WITH EXCISION OF SENTINEL NODE;  Surgeon: Robert Bellow, MD;  Location: [facility] ORS;  Service: General;  Laterality: Left;   BUNIONECTOMY Bilateral 2003   CHOLECYSTECTOMY  06/04/2010   COLON RESECTION  2004   due diverticulitis    COLONOSCOPY  2005   [place], Dr. Bary Harris   COLONOSCOPY  06/28/2012   COLOSTOMY  2004   COLOSTOMY REVERSAL  2004   FOOT SURGERY Bilateral 2012   plantar faciatis   HAND SURGERY Bilateral    carpal tunnel   HERNIA REPAIR  2005   at colostomy site after reversal done   JOINT REPLACEMENT     KNEE ARTHROPLASTY Right 04/20/2017   Procedure: COMPUTER ASSISTED TOTAL KNEE ARTHROPLASTY;  Surgeon: Dereck Leep, MD;  Location: [facility] ORS;  Service: Orthopedics;  Laterality: Right;   KNEE ARTHROSCOPY Right 02/03/2015   Procedure: ARTHROSCOPY right knee, partial medial menisectomy, condyle malleolus, patella and femoral;  Surgeon: Dereck Leep, MD;  Location: [facility] ORS;  Service: Orthopedics;  Laterality: Right;   NASAL SINUS SURGERY     2005   PORTACATH PLACEMENT Right 04/26/2018   Procedure: INSERTION PORT-A-CATH RIGHT;  Surgeon: Robert Bellow, MD;  Location: [facility] ORS;  Service: General;  Laterality: Right;   [place]   UPPER GI ENDOSCOPY  06/28/2012   VAGINAL HYSTERECTOMY  1987    SOCIAL HISTORY: Social History   Socioeconomic History   Marital status: Married    Spouse name: Megan Harris   Number of children: 2   Years of education: Not on file   Highest education level: Not on file  Occupational History   Occupation: worked at [employer] in [place]: retired  Scientist, product/process development strain: Not on file   Food  insecurity    Worry: Not on file    Inability: Not on Lexicographer needs    Medical: Not on file    Non-medical: Not on file  Tobacco Use   Smoking status: Never Smoker   Smokeless tobacco: Never Used  Substance and Sexual Activity   Alcohol use: Not Currently   Drug use: No   Sexual activity: Yes    Birth control/protection: Surgical  Lifestyle   Physical activity    Days per week: Not on file    Minutes per session: Not on file   Stress: Not on file  Relationships   Social connections    Talks on phone: Not on file    Gets together: Not on file    Attends religious service: Not on file    Active member of club or organization: Not on file    Attends meetings of clubs or organizations: Not on file    Relationship status: Not on file   Intimate partner violence    Fear of current or ex partner: Not on file    Emotionally abused: Not on file  Physically abused: Not on file    Forced sexual activity: Not on file  Other Topics Concern   Not on file  Social History Narrative   Not on file   Lives at home with husband.  FAMILY HISTORY: Family History  Problem Relation Age of Onset   Heart disease Father    Diabetes Paternal Grandmother    Diabetes Paternal Grandfather    Brain cancer Mother 68   Bone cancer Sister 8   Colon cancer Maternal Uncle        dx 50s-60s   Throat cancer Cousin    Leukemia Cousin    Lung cancer Cousin    Ovarian cancer Neg Hx     ALLERGIES:  is allergic to codeine and penicillins.  MEDICATIONS:  Current Outpatient Medications  Medication Sig Dispense Refill   acetaminophen (TYLENOL) 500 MG tablet Take 500 mg by mouth every 6 (six) hours as needed.     albuterol (PROVENTIL HFA;VENTOLIN HFA) 108 (90 Base) MCG/ACT inhaler Inhale 2 puffs into the lungs every 6 (six) hours as needed for wheezing or shortness of breath.      amLODipine (NORVASC) 5 MG tablet Take 5 mg by mouth daily.     atorvastatin  (LIPITOR) 10 MG tablet Take 10 mg by mouth daily.     Cholecalciferol (VITAMIN D3 MAXIMUM STRENGTH) 5000 units capsule Take 5,000 Units by mouth daily.     fluconazole (DIFLUCAN) 100 MG tablet Take 264m on Day 1, then take 1017mevery day 8 tablet 0   gabapentin (NEURONTIN) 300 MG capsule TAKE 2 CAPSULES BY MOUTH 3 TIMES DAILY (Patient taking differently: Take 300 mg by mouth 4 (four) times daily. ) 180 capsule 0   hydrochlorothiazide (HYDRODIURIL) 25 MG tablet Take 25 mg by mouth daily.     lansoprazole (PREVACID) 30 MG capsule Take 30 mg by mouth 2 (two) times daily.      levothyroxine (SYNTHROID, LEVOTHROID) 50 MCG tablet Take 50 mcg by mouth daily before breakfast.      lidocaine-prilocaine (EMLA) cream Apply to affected area once 30 g 3   losartan (COZAAR) 100 MG tablet Take 100 mg by mouth daily.     magnesium chloride (SLOW-MAG) 64 MG TBEC SR tablet Take 1 tablet (64 mg total) by mouth 2 (two) times daily. 60 tablet 0   nystatin (MYCOSTATIN) 100000 UNIT/ML suspension TAKE 1 TEASPOONFUL (5ML) BY MOUTH 4 TIMES DAILY 473 mL 0   potassium chloride SA (K-DUR) 20 MEQ tablet TAKE 2 TABLETS BY MOUTH DAILY 60 tablet 0   promethazine (PHENERGAN) 25 MG tablet Take 1 tablet (25 mg total) by mouth every 6 (six) hours as needed for nausea or vomiting. 60 tablet 1   valACYclovir (VALTREX) 500 MG tablet TAKE 1 TABLET BY MOUTH TWICE DAILY 70 tablet 0   zolpidem (AMBIEN) 10 MG tablet Take 10 mg by mouth at bedtime.      No current facility-administered medications for this visit.      PHYSICAL EXAMINATION: ECOG PERFORMANCE STATUS: 1 - Symptomatic but completely ambulatory Vitals:   10/09/18 1023  BP: 101/73  Pulse: 97  Resp: 16  Temp: 98.9 F (37.2 C)   Filed Weights   10/09/18 1023  Weight: 241 lb 4.8 oz (109.5 kg)    Physical Exam Constitutional:      General: She is not in acute distress.    Appearance: She is obese.  HENT:     Head: Normocephalic and atraumatic.      Mouth/Throat:  Comments: [name] has cleared.  Eyes:     General: No scleral icterus.    Pupils: Pupils are equal, round, and reactive to light.  Neck:     Musculoskeletal: Normal range of motion and neck supple.  Cardiovascular:     Rate and Rhythm: Normal rate and regular rhythm.     Heart sounds: Normal heart sounds.  Pulmonary:     Effort: Pulmonary effort is normal. No respiratory distress.     Breath sounds: No wheezing.  Abdominal:     General: Bowel sounds are normal. There is no distension.     Palpations: Abdomen is soft. There is no mass.     Tenderness: There is no abdominal tenderness.  Musculoskeletal: Normal range of motion.        General: No deformity.  Skin:    General: Skin is warm and dry.     Findings: No erythema or rash.     Comments: Discoloration of nails  Neurological:     Mental Status: She is alert and oriented to person, place, and time.     Cranial Nerves: No cranial nerve deficit.     Coordination: Coordination normal.  Psychiatric:        Behavior: Behavior normal.        Thought Content: Thought content normal.         LABORATORY DATA:  I have reviewed the data as listed Lab Results  Component Value Date   WBC 3.3 (L) 10/09/2018   HGB 11.3 (L) 10/09/2018   HCT 33.9 (L) 10/09/2018   MCV 97.7 10/09/2018   PLT 246 10/09/2018   Recent Labs    09/27/18 0849 10/04/18 0817 10/09/18 1100  NA 136 135 136  K 3.1* 3.0* 3.8  CL 99 99 98  CO2 26 26 27   GLUCOSE 209* 170* 127*  BUN 11 11 13   CREATININE 0.81 0.80 0.94  CALCIUM 8.7* 8.7* 9.1  GFRNONAA >60 >60 >60  GFRAA >60 >60 >60  PROT 6.1* 6.3* 6.6  ALBUMIN 3.6 3.5 3.7  AST 51* 45* 46*  ALT 50* 39 54*  ALKPHOS 52 55 53  BILITOT 0.9 1.1 0.9   Iron/TIBC/Ferritin/ %Sat No results found for: IRON, TIBC, FERRITIN, IRONPCTSAT   Pathology 04/26/2018 CASE: [id number]  PATIENT: Megan Harris  Surgical Pathology Report DIAGNOSIS:  A. BREAST, LEFT UPPER INNER QUADRANT; WIDE  EXCISION:  - INVASIVE MAMMARY CARCINOMA.  - SEE CANCER SUMMARY BELOW.  - BIOPSY SITE CHANGE WITH HEMATOMA AND RIBBON CLIP.  - UNREMARKABLE SKIN.  B. SENTINEL LYMPH NODE #1; EXCISION:  - ONE LYMPH NODE NEGATIVE FOR MALIGNANCY (0/1).  C. NEW BREAST MARGIN, SUPERIOR; EXCISION:  - BENIGN BREAST TISSUE WITH BIOPSY CHANGE AND HEMATOMA.  - NEGATIVE FOR MALIGNANCY.  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, without comedonecrosis  Margins:    Invasive Carcinoma Margins: Uninvolved by invasive carcinoma  Distance from closest margin: 1.7 mm            Specify closest margin: Anterior       DCIS Margins: Uninvolved by DCIS            Distance from closest margin: 1.2 mm            Specify closest margin: Anterior  Regional Lymph Nodes: Uninvolved by tumor cells    Total Number of Lymph Nodes Examined: 1       Number of Sentinel Nodes Examined: 1  Treatment Effect in the Breast: No known presurgical therapy  Lymphovascular Invasion: Not identified  Pathologic Stage Classification (pTNM, AJCC 8th Edition): pT1c pN0 (sn)   Breast Biomarker Testing Performed on Previous Biopsy: [id number]  Estrogen Receptor (ER) Status: NEGATIVE  Progesterone Receptor (PgR) Status: NEGATIVE  HER2 (by immunohistochemistry): NEGATIVE     ASSESSMENT & PLAN:  1. Left flank pain   2. 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 ([place])   3. Thrush   Cancer Staging Triple negative malignant  neoplasm of breast [facility]) Staging form: Breast, AJCC 8th Edition - Clinical stage from 04/28/2018: Stage IB (cT1c, cN0, cM0, G3, ER-, PR-, HER2-) - Signed by Earlie Server, MD on 04/29/2018 - Pathologic stage from 05/25/2018: Stage IB (pT1c, pN0(sn), cM0, G3, ER-, PR-, HER2-) - Signed by Earlie Server, MD on 05/25/2018  #Left flank pain, this is a new problem for her. Check CBC, CMP, UA, urine culture. UA is positive for mild leukocytes.  Await for urine culture. Differential includes UTI- [ though patient does not have fever or chills or dysuria], nephrolithiasis, etc.   # #Thrush, she uses nystatin oral rinse with no significant improvement. Started on a course of Diflucan 200 mg on day 1, followed by 100 mg daily x6 days, recommend her to complete course.   # Stage IB left triple negative breast cancer S/p adjuvant chemotherapy. She has established care with radiation oncology.   #Port-A-Cath in place, patient plans to have Mediport taken out by Dr. Bary Harris in October.  She understands that if she developed recurrence in the future she may have to have Mediport inserted again.  Marland Kitchen  #Hypokalemia, potassium level has improved. K is 3.8 today.  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 Continue gabapentin 600 mg 3 times daily. #Grade 1 transaminitis, secondary to chemotherapy. Stable.  #Shingle prophylaxis, recommend patient to continue shingle prophylaxis until a few weeks after she finishes radiation Okay with flu shots.  Follow up as planned.  All questions were answered. The patient knows to call the clinic with any problems questions or concerns. Follow-up in 1 week. ZEarlie Server MD, PhD 10/09/2018

## 2018-10-09 NOTE — Consult Note (Signed)
NEW PATIENT EVALUATION  Name: Megan Harris  MRN: [id number]  Date:   10/09/2018     DOB: 1950-09-12   This 68 y.o. 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  REFERRING PHYSICIAN: Idelle Crouch, MD  CHIEF COMPLAINT:  Chief Complaint  Patient presents with  . Breast Cancer    Inital Eval    DIAGNOSIS: The encounter diagnosis was 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 ([place]).   PREVIOUS INVESTIGATIONS:  Mammogram and ultrasound reviewed Clinical notes reviewed Pathology report reviewed  HPI: Patient is a 68 year old female who presented with an abnormal mammogram of her left breast showing a lesion in the upper inner quadrant at the 11 o'clock position 10 cm from the nipple.  This was confirmed on ultrasound to be a hypoechoic mass measuring 1.2 cm in greatest dimension.  Patient underwent wide local excision for a 1.6 cm triple negative invasive mammary carcinoma overall grade 3.  Margins were clear but close at 1.7 mm for the invasive component.  1 regional lymph node sentinel node was examined and negative for metastatic disease.  Tumor was a T1CN0 invasive mammary carcinoma.  Based on the triple negative nature for disease she is completed AC plus Taxol.  Patient does have some peripheral neuropathy is otherwise without complaint.  She is referred to radiation oncology today.  She specifically denies breast tenderness cough or bone pain.  PLANNED TREATMENT REGIMEN: Left whole breast radiation  PAST MEDICAL HISTORY:  has a past medical history of Abdominal pain, left lower quadrant (2012), Anemia, Arthritis, Asthma, Back pain (2005), Bone cancer ([place]), Cancer ([place]) (04/26/2018), Chronic cholecystitis (2012), Constipation (2005), Endocrine problem (2005), Family  history of bone cancer, Family history of brain cancer, Family history of colon cancer, Family history of lung cancer, GERD (gastroesophageal reflux disease) (2012), Hyperlipidemia (2012), Hypertension (2002), Nausea with vomiting (2012), Obesity, unspecified (2012), Pneumonia, PONV (postoperative nausea and vomiting), and Special screening for malignant neoplasms, colon (2012).    PAST SURGICAL HISTORY:  Past Surgical History:  Procedure Laterality Date  . BILATERAL CARPAL TUNNEL RELEASE Bilateral   . BREAST BIOPSY Left 04/13/2018   Pending Path  . BREAST EXCISIONAL BIOPSY Left early 90s   neg  . BREAST LUMPECTOMY Left 04/26/2018   Procedure: BREAST LUMPECTOMY WITH EXCISION OF SENTINEL NODE;  Surgeon: Robert Bellow, MD;  Location: [facility] ORS;  Service: General;  Laterality: Left;  . BUNIONECTOMY Bilateral 2003  . CHOLECYSTECTOMY  06/04/2010  . COLON RESECTION  2004   due diverticulitis   . COLONOSCOPY  2005   [place], Dr. Bary Castilla  . COLONOSCOPY  06/28/2012  . COLOSTOMY  2004  . COLOSTOMY REVERSAL  2004  . FOOT SURGERY Bilateral 2012   plantar faciatis  . HAND SURGERY Bilateral    carpal tunnel  . HERNIA REPAIR  2005   at colostomy site after reversal done  . JOINT REPLACEMENT    . KNEE ARTHROPLASTY Right 04/20/2017   Procedure: COMPUTER ASSISTED TOTAL KNEE ARTHROPLASTY;  Surgeon: Dereck Leep, MD;  Location: [facility] ORS;  Service: Orthopedics;  Laterality: Right;  . KNEE ARTHROSCOPY Right 02/03/2015   Procedure: ARTHROSCOPY right knee, partial medial menisectomy, condyle malleolus, patella and femoral;  Surgeon: Dereck Leep, MD;  Location: [facility] ORS;  Service: Orthopedics;  Laterality: Right;  . NASAL SINUS SURGERY  2005  . PORTACATH PLACEMENT Right 04/26/2018   Procedure: INSERTION PORT-A-CATH RIGHT;  Surgeon: Robert Bellow, MD;  Location: [facility] ORS;  Service: General;  Laterality: Right;  . SALPINGOOPHORECTOMY  1998  . TONSILLECTOMY    . TUBAL LIGATION  1978  . UPPER GI  ENDOSCOPY  06/28/2012  . VAGINAL HYSTERECTOMY  1987    FAMILY HISTORY: family history includes Bone cancer (age of onset: 65) in her sister; Brain cancer (age of onset: 82) in her mother; Colon cancer in her maternal uncle; Diabetes in her paternal grandfather and paternal grandmother; Heart disease in her father; Leukemia in her cousin; Lung cancer in her cousin; Throat cancer in her cousin.  SOCIAL HISTORY:  reports that she has never smoked. She has never used smokeless tobacco. She reports previous alcohol use. She reports that she does not use drugs.  ALLERGIES: Codeine and Penicillins  MEDICATIONS:  Current Outpatient Medications  Medication Sig Dispense Refill  . acetaminophen (TYLENOL) 500 MG tablet Take 500 mg by mouth every 6 (six) hours as needed.    Marland Kitchen albuterol (PROVENTIL HFA;VENTOLIN HFA) 108 (90 Base) MCG/ACT inhaler Inhale 2 puffs into the lungs every 6 (six) hours as needed for wheezing or shortness of breath.     Marland Kitchen amLODipine (NORVASC) 5 MG tablet Take 5 mg by mouth daily.    Marland Kitchen atorvastatin (LIPITOR) 10 MG tablet Take 10 mg by mouth daily.    . Cholecalciferol (VITAMIN D3 MAXIMUM STRENGTH) 5000 units capsule Take 5,000 Units by mouth daily.    . fluconazole (DIFLUCAN) 100 MG tablet Take 200mg  on Day 1, then take 100mg  every day 8 tablet 0  . gabapentin (NEURONTIN) 300 MG capsule TAKE 2 CAPSULES BY MOUTH 3 TIMES DAILY (Patient taking differently: Take 300 mg by mouth 4 (four) times daily. ) 180 capsule 0  . hydrochlorothiazide (HYDRODIURIL) 25 MG tablet Take 25 mg by mouth daily.    . lansoprazole (PREVACID) 30 MG capsule Take 30 mg by mouth 2 (two) times daily.     Marland Kitchen levothyroxine (SYNTHROID, LEVOTHROID) 50 MCG tablet Take 50 mcg by mouth daily before breakfast.     . lidocaine-prilocaine (EMLA) cream Apply to affected area once 30 g 3  . losartan (COZAAR) 100 MG tablet Take 100 mg by mouth daily.    . magnesium chloride (SLOW-MAG) 64 MG TBEC SR tablet Take 1 tablet (64 mg  total) by mouth 2 (two) times daily. 60 tablet 0  . nystatin (MYCOSTATIN) 100000 UNIT/ML suspension TAKE 1 TEASPOONFUL (5ML) BY MOUTH 4 TIMES DAILY 473 mL 0  . potassium chloride SA (K-DUR) 20 MEQ tablet TAKE 2 TABLETS BY MOUTH DAILY 60 tablet 0  . promethazine (PHENERGAN) 25 MG tablet Take 1 tablet (25 mg total) by mouth every 6 (six) hours as needed for nausea or vomiting. 60 tablet 1  . valACYclovir (VALTREX) 500 MG tablet TAKE 1 TABLET BY MOUTH TWICE DAILY 70 tablet 0  . zolpidem (AMBIEN) 10 MG tablet Take 10 mg by mouth at bedtime.      No current facility-administered medications for this encounter.     ECOG PERFORMANCE STATUS:  0 - Asymptomatic  REVIEW OF SYSTEMS: Patient denies any weight loss, fatigue, weakness, fever, chills or night sweats. Patient denies any loss of vision, blurred vision. Patient denies any ringing  of the ears or hearing loss. No irregular heartbeat. Patient denies heart murmur or history of fainting. Patient denies any chest pain or pain radiating to her upper extremities. Patient denies  any shortness of breath, difficulty breathing at night, cough or hemoptysis. Patient denies any swelling in the lower legs. Patient denies any nausea vomiting, vomiting of blood, or coffee ground material in the vomitus. Patient denies any stomach pain. Patient states has had normal bowel movements no significant constipation or diarrhea. Patient denies any dysuria, hematuria or significant nocturia. Patient denies any problems walking, swelling in the joints or loss of balance. Patient denies any skin changes, loss of hair or loss of weight. Patient denies any excessive worrying or anxiety or significant depression. Patient denies any problems with insomnia. Patient denies excessive thirst, polyuria, polydipsia. Patient denies any swollen glands, patient denies easy bruising or easy bleeding. Patient denies any recent infections, allergies or URI. Patient "s visual fields have not  changed significantly in recent time.   PHYSICAL EXAM: BP 140/89   Pulse 97   Temp 98.9 F (37.2 C)   Resp 18   Wt 241 lb 3.2 oz (109.4 kg)   BMI 40.14 kg/m  Patient is rather obese with large pendulous breasts making hypofractionated course of treatment difficult.  No dominant mass or nodularity is noted in either breast in 2 positions examined.  Her initial lumpectomy scar and sentinel biopsy scars are both well-healed.  No axillary or supraclavicular adenopathy is appreciated.  Well-developed well-nourished patient in NAD. HEENT reveals PERLA, EOMI, discs not visualized.  Oral cavity is clear. No oral mucosal lesions are identified. Neck is clear without evidence of cervical or supraclavicular adenopathy. Lungs are clear to A&P. Cardiac examination is essentially unremarkable with regular rate and rhythm without murmur rub or thrill. Abdomen is benign with no organomegaly or masses noted. Motor sensory and DTR levels are equal and symmetric in the upper and lower extremities. Cranial nerves II through XII are grossly intact. Proprioception is intact. No peripheral adenopathy or edema is identified. No motor or sensory levels are noted. Crude visual fields are within normal range.  LABORATORY DATA: Pathology report reviewed    RADIOLOGY RESULTS: Mammogram and ultrasound reviewed   IMPRESSION: Stage I triple negative invasive mammary carcinoma the left breast status post adjuvant chemotherapy after lumpectomy and sentinel node biopsy in 68 year old female  PLAN: At this time like to go with whole breast radiation to her left breast.  Based on the large size of her breasts would not treat with hypofractionated course of treatment.  I have planned on a course of 5040 cGy in 28 fractions to her whole breast.  Would also boost her scar another 1600 centigrade based on the less than 2 mm margin.  Risks and benefits of treatment including skin reaction fatigue alteration of blood counts possible  occlusion of superficial lung all were discussed in detail with the patient.  We will keep her heart completely out of our treatment field as possible.  I have personally set up and ordered CT simulation for later this week.  Patient seems to comprehend my treatment plan well.  I would like to take this opportunity to thank you for allowing me to participate in the care of your patient.Noreene Filbert, MD

## 2018-10-09 NOTE — Progress Notes (Signed)
Patient being seen for mouth sores and right side back pain.  The right side back started 3 days ago.

## 2018-10-11 ENCOUNTER — Other Ambulatory Visit: Payer: Self-pay

## 2018-10-11 ENCOUNTER — Ambulatory Visit
Admission: RE | Admit: 2018-10-11 | Discharge: 2018-10-11 | Disposition: A | Discharge status: Home / Self Care | Payer: PPO | Source: Ambulatory Visit | Attending: Radiation Oncology | Admitting: Radiation Oncology

## 2018-10-11 ENCOUNTER — Other Ambulatory Visit: Payer: Self-pay | Admitting: Oncology

## 2018-10-11 DIAGNOSIS — Z171 Estrogen receptor negative status [ER-]: Secondary | ICD-10-CM | POA: Diagnosis not present

## 2018-10-11 DIAGNOSIS — C50212 Malignant neoplasm of upper-inner quadrant of left female breast: Secondary | ICD-10-CM | POA: Insufficient documentation

## 2018-10-11 DIAGNOSIS — Z510 Encounter for antineoplastic radiation therapy: Secondary | ICD-10-CM | POA: Insufficient documentation

## 2018-10-11 LAB — URINE CULTURE: Culture: >=100000 — AB

## 2018-10-11 MED ORDER — NITROFURANTOIN MONOHYD MACRO 100 MG PO CAPS: 100.0000 mg | ORAL_CAPSULE | Freq: Two times a day (BID) | ORAL | 0 refills | Status: DC

## 2018-10-12 ENCOUNTER — Telehealth: Payer: Self-pay

## 2018-10-12 DIAGNOSIS — Z171 Estrogen receptor negative status [ER-]: Secondary | ICD-10-CM | POA: Diagnosis not present

## 2018-10-12 DIAGNOSIS — Z510 Encounter for antineoplastic radiation therapy: Secondary | ICD-10-CM | POA: Diagnosis not present

## 2018-10-12 DIAGNOSIS — C50212 Malignant neoplasm of upper-inner quadrant of left female breast: Secondary | ICD-10-CM | POA: Diagnosis not present

## 2018-10-12 NOTE — Telephone Encounter (Signed)
Patient informed. 

## 2018-10-12 NOTE — Telephone Encounter (Signed)
-----   Message from Earlie Server, MD sent at 10/11/2018 11:53 PM EDT ----- Please let pt know that her urine culture grew bacteria, she has UTI, I recommend Macrobid BID x 5 days. Rx sent to pharmacy. Thanks.

## 2018-10-13 ENCOUNTER — Other Ambulatory Visit: Payer: Self-pay | Admitting: *Deleted

## 2018-10-13 DIAGNOSIS — C50919 Malignant neoplasm of unspecified site of unspecified female breast: Secondary | ICD-10-CM

## 2018-10-16 ENCOUNTER — Other Ambulatory Visit: Payer: Self-pay | Admitting: Oncology

## 2018-10-17 ENCOUNTER — Other Ambulatory Visit: Payer: Self-pay | Admitting: *Deleted

## 2018-10-17 MED ORDER — MAGNESIUM CHLORIDE 64 MG PO TBEC: 1.0000 | DELAYED_RELEASE_TABLET | Freq: Two times a day (BID) | ORAL | 0 refills | Status: DC

## 2018-10-18 ENCOUNTER — Inpatient Hospital Stay: Payer: PPO

## 2018-10-18 ENCOUNTER — Other Ambulatory Visit: Payer: Self-pay

## 2018-10-18 ENCOUNTER — Ambulatory Visit
Admission: RE | Admit: 2018-10-18 | Discharge: 2018-10-18 | Disposition: A | Discharge status: Home / Self Care | Payer: PPO | Source: Ambulatory Visit | Attending: Radiation Oncology | Admitting: Radiation Oncology

## 2018-10-18 DIAGNOSIS — Z171 Estrogen receptor negative status [ER-]: Secondary | ICD-10-CM | POA: Diagnosis not present

## 2018-10-18 DIAGNOSIS — Z5111 Encounter for antineoplastic chemotherapy: Secondary | ICD-10-CM | POA: Diagnosis not present

## 2018-10-18 DIAGNOSIS — C50919 Malignant neoplasm of unspecified site of unspecified female breast: Secondary | ICD-10-CM

## 2018-10-18 DIAGNOSIS — C50212 Malignant neoplasm of upper-inner quadrant of left female breast: Secondary | ICD-10-CM | POA: Diagnosis not present

## 2018-10-18 DIAGNOSIS — Z510 Encounter for antineoplastic radiation therapy: Secondary | ICD-10-CM | POA: Diagnosis not present

## 2018-10-18 LAB — BASIC METABOLIC PANEL
Anion gap: 8 (ref 5–15)
BUN: 15 mg/dL (ref 8–23)
CO2: 28 mmol/L (ref 22–32)
Calcium: 9.4 mg/dL (ref 8.9–10.3)
Chloride: 103 mmol/L (ref 98–111)
Creatinine, Ser: 0.8 mg/dL (ref 0.44–1.00)
GFR calc Af Amer: >60 mL/min (ref >60)
GFR calc non Af Amer: >60 mL/min (ref >60)
Glucose, Bld: 141 mg/dL — ABNORMAL HIGH (ref 70–99)
Potassium: 4.1 mmol/L (ref 3.5–5.1)
Sodium: 139 mmol/L (ref 135–145)

## 2018-10-18 LAB — MAGNESIUM: Magnesium: 1.6 mg/dL — ABNORMAL LOW (ref 1.7–2.4)

## 2018-10-18 NOTE — Telephone Encounter (Signed)
Refill request received for potassium.  Dr. Tasia Catchings would like patient to stop the potassium and re-check labs in 2 weeks to re-assess need for potassium supplement.

## 2018-10-19 ENCOUNTER — Ambulatory Visit
Admission: RE | Admit: 2018-10-19 | Discharge: 2018-10-19 | Disposition: A | Discharge status: Home / Self Care | Payer: PPO | Source: Ambulatory Visit | Attending: Radiation Oncology | Admitting: Radiation Oncology

## 2018-10-19 ENCOUNTER — Other Ambulatory Visit: Payer: Self-pay

## 2018-10-19 DIAGNOSIS — C50212 Malignant neoplasm of upper-inner quadrant of left female breast: Secondary | ICD-10-CM | POA: Diagnosis not present

## 2018-10-19 DIAGNOSIS — Z171 Estrogen receptor negative status [ER-]: Secondary | ICD-10-CM | POA: Insufficient documentation

## 2018-10-19 DIAGNOSIS — Z510 Encounter for antineoplastic radiation therapy: Secondary | ICD-10-CM | POA: Diagnosis not present

## 2018-10-20 ENCOUNTER — Ambulatory Visit
Admission: RE | Admit: 2018-10-20 | Discharge: 2018-10-20 | Disposition: A | Discharge status: Home / Self Care | Payer: PPO | Source: Ambulatory Visit | Attending: Radiation Oncology | Admitting: Radiation Oncology

## 2018-10-20 ENCOUNTER — Other Ambulatory Visit: Payer: Self-pay

## 2018-10-20 DIAGNOSIS — Z171 Estrogen receptor negative status [ER-]: Secondary | ICD-10-CM | POA: Diagnosis not present

## 2018-10-20 DIAGNOSIS — C50212 Malignant neoplasm of upper-inner quadrant of left female breast: Secondary | ICD-10-CM | POA: Diagnosis not present

## 2018-10-20 DIAGNOSIS — Z510 Encounter for antineoplastic radiation therapy: Secondary | ICD-10-CM | POA: Diagnosis not present

## 2018-10-23 ENCOUNTER — Other Ambulatory Visit: Payer: Self-pay

## 2018-10-23 ENCOUNTER — Ambulatory Visit
Admission: RE | Admit: 2018-10-23 | Discharge: 2018-10-23 | Disposition: A | Discharge status: Home / Self Care | Payer: PPO | Source: Ambulatory Visit | Attending: Radiation Oncology | Admitting: Radiation Oncology

## 2018-10-23 DIAGNOSIS — Z510 Encounter for antineoplastic radiation therapy: Secondary | ICD-10-CM | POA: Diagnosis not present

## 2018-10-23 DIAGNOSIS — C50212 Malignant neoplasm of upper-inner quadrant of left female breast: Secondary | ICD-10-CM | POA: Diagnosis not present

## 2018-10-23 DIAGNOSIS — Z171 Estrogen receptor negative status [ER-]: Secondary | ICD-10-CM | POA: Diagnosis not present

## 2018-10-24 ENCOUNTER — Inpatient Hospital Stay: Payer: PPO | Attending: Oncology

## 2018-10-24 ENCOUNTER — Ambulatory Visit
Admission: RE | Admit: 2018-10-24 | Discharge: 2018-10-24 | Disposition: A | Discharge status: Home / Self Care | Payer: PPO | Source: Ambulatory Visit | Attending: Radiation Oncology | Admitting: Radiation Oncology

## 2018-10-24 ENCOUNTER — Telehealth: Payer: Self-pay

## 2018-10-24 ENCOUNTER — Other Ambulatory Visit: Payer: Self-pay

## 2018-10-24 DIAGNOSIS — Z510 Encounter for antineoplastic radiation therapy: Secondary | ICD-10-CM | POA: Diagnosis not present

## 2018-10-24 DIAGNOSIS — Z171 Estrogen receptor negative status [ER-]: Secondary | ICD-10-CM | POA: Diagnosis not present

## 2018-10-24 DIAGNOSIS — C50212 Malignant neoplasm of upper-inner quadrant of left female breast: Secondary | ICD-10-CM | POA: Diagnosis not present

## 2018-10-24 LAB — MAGNESIUM: Magnesium: 1.9 mg/dL (ref 1.7–2.4)

## 2018-10-24 NOTE — Telephone Encounter (Signed)
-----   Message from Earlie Server, MD sent at 10/23/2018 10:52 PM EDT ----- Please schedule pt to repeat magnesium-[only need Magnesium] level this week, possible IV magnesium. thankyou

## 2018-10-24 NOTE — Telephone Encounter (Signed)
LM informing patient.

## 2018-10-24 NOTE — Telephone Encounter (Signed)
Patient can't keep the appt that was scheduled for [id number] due to conflict in another MD appt.  We will get the mag lab today and work on getting her infusion appt if needed for mag infusion.

## 2018-10-25 ENCOUNTER — Other Ambulatory Visit: Payer: Self-pay

## 2018-10-25 ENCOUNTER — Ambulatory Visit
Admission: RE | Admit: 2018-10-25 | Discharge: 2018-10-25 | Disposition: A | Discharge status: Home / Self Care | Payer: PPO | Source: Ambulatory Visit | Attending: Radiation Oncology | Admitting: Radiation Oncology

## 2018-10-25 DIAGNOSIS — C50212 Malignant neoplasm of upper-inner quadrant of left female breast: Secondary | ICD-10-CM | POA: Diagnosis not present

## 2018-10-25 DIAGNOSIS — Z171 Estrogen receptor negative status [ER-]: Secondary | ICD-10-CM | POA: Diagnosis not present

## 2018-10-25 DIAGNOSIS — Z510 Encounter for antineoplastic radiation therapy: Secondary | ICD-10-CM | POA: Diagnosis not present

## 2018-10-25 NOTE — Telephone Encounter (Signed)
Patient notified via MyChart result note.

## 2018-10-26 ENCOUNTER — Other Ambulatory Visit: Payer: Self-pay

## 2018-10-26 ENCOUNTER — Inpatient Hospital Stay: Payer: PPO

## 2018-10-26 ENCOUNTER — Ambulatory Visit
Admission: RE | Admit: 2018-10-26 | Discharge: 2018-10-26 | Disposition: A | Discharge status: Home / Self Care | Payer: PPO | Source: Ambulatory Visit | Attending: Radiation Oncology | Admitting: Radiation Oncology

## 2018-10-26 ENCOUNTER — Other Ambulatory Visit: Payer: PPO

## 2018-10-26 DIAGNOSIS — Z510 Encounter for antineoplastic radiation therapy: Secondary | ICD-10-CM | POA: Diagnosis not present

## 2018-10-26 DIAGNOSIS — C50212 Malignant neoplasm of upper-inner quadrant of left female breast: Secondary | ICD-10-CM | POA: Diagnosis not present

## 2018-10-26 DIAGNOSIS — Z171 Estrogen receptor negative status [ER-]: Secondary | ICD-10-CM | POA: Diagnosis not present

## 2018-10-26 DIAGNOSIS — C50312 Malignant neoplasm of lower-inner quadrant of left female breast: Secondary | ICD-10-CM | POA: Diagnosis not present

## 2018-10-27 ENCOUNTER — Other Ambulatory Visit: Payer: Self-pay

## 2018-10-27 ENCOUNTER — Ambulatory Visit
Admission: RE | Admit: 2018-10-27 | Discharge: 2018-10-27 | Disposition: A | Discharge status: Home / Self Care | Payer: PPO | Source: Ambulatory Visit | Attending: Radiation Oncology | Admitting: Radiation Oncology

## 2018-10-27 DIAGNOSIS — C50212 Malignant neoplasm of upper-inner quadrant of left female breast: Secondary | ICD-10-CM | POA: Diagnosis not present

## 2018-10-27 DIAGNOSIS — Z510 Encounter for antineoplastic radiation therapy: Secondary | ICD-10-CM | POA: Diagnosis not present

## 2018-10-27 DIAGNOSIS — Z171 Estrogen receptor negative status [ER-]: Secondary | ICD-10-CM | POA: Diagnosis not present

## 2018-10-30 ENCOUNTER — Ambulatory Visit
Admission: RE | Admit: 2018-10-30 | Discharge: 2018-10-30 | Disposition: A | Discharge status: Home / Self Care | Payer: PPO | Source: Ambulatory Visit | Attending: Radiation Oncology | Admitting: Radiation Oncology

## 2018-10-30 ENCOUNTER — Other Ambulatory Visit: Payer: Self-pay

## 2018-10-30 DIAGNOSIS — Z510 Encounter for antineoplastic radiation therapy: Secondary | ICD-10-CM | POA: Diagnosis not present

## 2018-10-30 DIAGNOSIS — Z171 Estrogen receptor negative status [ER-]: Secondary | ICD-10-CM | POA: Diagnosis not present

## 2018-10-30 DIAGNOSIS — C50212 Malignant neoplasm of upper-inner quadrant of left female breast: Secondary | ICD-10-CM | POA: Diagnosis not present

## 2018-10-31 ENCOUNTER — Ambulatory Visit
Admission: RE | Admit: 2018-10-31 | Discharge: 2018-10-31 | Disposition: A | Discharge status: Home / Self Care | Payer: PPO | Source: Ambulatory Visit | Attending: Radiation Oncology | Admitting: Radiation Oncology

## 2018-10-31 ENCOUNTER — Other Ambulatory Visit: Payer: Self-pay

## 2018-10-31 ENCOUNTER — Inpatient Hospital Stay: Payer: PPO

## 2018-10-31 DIAGNOSIS — Z510 Encounter for antineoplastic radiation therapy: Secondary | ICD-10-CM | POA: Diagnosis not present

## 2018-10-31 DIAGNOSIS — Z171 Estrogen receptor negative status [ER-]: Secondary | ICD-10-CM | POA: Diagnosis not present

## 2018-10-31 DIAGNOSIS — C50212 Malignant neoplasm of upper-inner quadrant of left female breast: Secondary | ICD-10-CM | POA: Diagnosis not present

## 2018-11-01 ENCOUNTER — Inpatient Hospital Stay: Payer: PPO

## 2018-11-01 ENCOUNTER — Ambulatory Visit
Admission: RE | Admit: 2018-11-01 | Discharge: 2018-11-01 | Disposition: A | Discharge status: Home / Self Care | Payer: PPO | Source: Ambulatory Visit | Attending: Radiation Oncology | Admitting: Radiation Oncology

## 2018-11-01 ENCOUNTER — Other Ambulatory Visit: Payer: Self-pay

## 2018-11-01 DIAGNOSIS — Z171 Estrogen receptor negative status [ER-]: Secondary | ICD-10-CM | POA: Diagnosis not present

## 2018-11-01 DIAGNOSIS — C50919 Malignant neoplasm of unspecified site of unspecified female breast: Secondary | ICD-10-CM

## 2018-11-01 DIAGNOSIS — Z510 Encounter for antineoplastic radiation therapy: Secondary | ICD-10-CM | POA: Diagnosis not present

## 2018-11-01 DIAGNOSIS — C50212 Malignant neoplasm of upper-inner quadrant of left female breast: Secondary | ICD-10-CM | POA: Diagnosis not present

## 2018-11-01 LAB — BASIC METABOLIC PANEL
Anion gap: 8 (ref 5–15)
BUN: 14 mg/dL (ref 8–23)
CO2: 28 mmol/L (ref 22–32)
Calcium: 9.2 mg/dL (ref 8.9–10.3)
Chloride: 102 mmol/L (ref 98–111)
Creatinine, Ser: 1.12 mg/dL — ABNORMAL HIGH (ref 0.44–1.00)
GFR calc Af Amer: 58 mL/min — ABNORMAL LOW (ref >60)
GFR calc non Af Amer: 50 mL/min — ABNORMAL LOW (ref >60)
Glucose, Bld: 124 mg/dL — ABNORMAL HIGH (ref 70–99)
Potassium: 3.9 mmol/L (ref 3.5–5.1)
Sodium: 138 mmol/L (ref 135–145)

## 2018-11-01 LAB — MAGNESIUM: Magnesium: 1.8 mg/dL (ref 1.7–2.4)

## 2018-11-02 ENCOUNTER — Telehealth: Payer: Self-pay

## 2018-11-02 ENCOUNTER — Other Ambulatory Visit: Payer: Self-pay

## 2018-11-02 ENCOUNTER — Other Ambulatory Visit: Payer: PPO

## 2018-11-02 ENCOUNTER — Ambulatory Visit
Admission: RE | Admit: 2018-11-02 | Discharge: 2018-11-02 | Disposition: A | Discharge status: Home / Self Care | Payer: PPO | Source: Ambulatory Visit | Attending: Radiation Oncology | Admitting: Radiation Oncology

## 2018-11-02 DIAGNOSIS — Z510 Encounter for antineoplastic radiation therapy: Secondary | ICD-10-CM | POA: Diagnosis not present

## 2018-11-02 DIAGNOSIS — C50212 Malignant neoplasm of upper-inner quadrant of left female breast: Secondary | ICD-10-CM | POA: Diagnosis not present

## 2018-11-02 DIAGNOSIS — Z171 Estrogen receptor negative status [ER-]: Secondary | ICD-10-CM | POA: Diagnosis not present

## 2018-11-02 NOTE — Telephone Encounter (Signed)
-----   Message from Earlie Server, MD sent at 11/02/2018  3:35 PM EDT ----- Creatine jumped up a bit. Please advise pt to increase oral fluid intake and stay hydrated. Please arrange her to repeat bmp in a week.

## 2018-11-02 NOTE — Telephone Encounter (Signed)
Done.. Pt has been scheduled for labs in 1 week as requested.

## 2018-11-03 ENCOUNTER — Ambulatory Visit
Admission: RE | Admit: 2018-11-03 | Discharge: 2018-11-03 | Disposition: A | Discharge status: Home / Self Care | Payer: PPO | Source: Ambulatory Visit | Attending: Radiation Oncology | Admitting: Radiation Oncology

## 2018-11-03 ENCOUNTER — Other Ambulatory Visit: Payer: Self-pay

## 2018-11-03 DIAGNOSIS — C50212 Malignant neoplasm of upper-inner quadrant of left female breast: Secondary | ICD-10-CM | POA: Diagnosis not present

## 2018-11-03 DIAGNOSIS — Z510 Encounter for antineoplastic radiation therapy: Secondary | ICD-10-CM | POA: Diagnosis not present

## 2018-11-03 DIAGNOSIS — Z171 Estrogen receptor negative status [ER-]: Secondary | ICD-10-CM | POA: Diagnosis not present

## 2018-11-03 NOTE — Telephone Encounter (Signed)
Thanks. I called patient to notify here and she voiced understanding.

## 2018-11-06 ENCOUNTER — Ambulatory Visit
Admission: RE | Admit: 2018-11-06 | Discharge: 2018-11-06 | Disposition: A | Discharge status: Home / Self Care | Payer: PPO | Source: Ambulatory Visit | Attending: Radiation Oncology | Admitting: Radiation Oncology

## 2018-11-06 ENCOUNTER — Other Ambulatory Visit: Payer: Self-pay

## 2018-11-06 DIAGNOSIS — Z510 Encounter for antineoplastic radiation therapy: Secondary | ICD-10-CM | POA: Diagnosis not present

## 2018-11-06 DIAGNOSIS — C50212 Malignant neoplasm of upper-inner quadrant of left female breast: Secondary | ICD-10-CM | POA: Diagnosis not present

## 2018-11-06 DIAGNOSIS — Z171 Estrogen receptor negative status [ER-]: Secondary | ICD-10-CM | POA: Diagnosis not present

## 2018-11-07 ENCOUNTER — Ambulatory Visit
Admission: RE | Admit: 2018-11-07 | Discharge: 2018-11-07 | Disposition: A | Discharge status: Home / Self Care | Payer: PPO | Source: Ambulatory Visit | Attending: Radiation Oncology | Admitting: Radiation Oncology

## 2018-11-07 ENCOUNTER — Other Ambulatory Visit: Payer: Self-pay

## 2018-11-07 DIAGNOSIS — C50212 Malignant neoplasm of upper-inner quadrant of left female breast: Secondary | ICD-10-CM | POA: Diagnosis not present

## 2018-11-07 DIAGNOSIS — Z171 Estrogen receptor negative status [ER-]: Secondary | ICD-10-CM | POA: Diagnosis not present

## 2018-11-07 DIAGNOSIS — Z510 Encounter for antineoplastic radiation therapy: Secondary | ICD-10-CM | POA: Diagnosis not present

## 2018-11-08 ENCOUNTER — Ambulatory Visit
Admission: RE | Admit: 2018-11-08 | Discharge: 2018-11-08 | Disposition: A | Discharge status: Home / Self Care | Payer: PPO | Source: Ambulatory Visit | Attending: Radiation Oncology | Admitting: Radiation Oncology

## 2018-11-08 ENCOUNTER — Ambulatory Visit: Payer: PPO | Admitting: Surgery

## 2018-11-08 ENCOUNTER — Other Ambulatory Visit: Payer: Self-pay

## 2018-11-08 DIAGNOSIS — C50212 Malignant neoplasm of upper-inner quadrant of left female breast: Secondary | ICD-10-CM | POA: Diagnosis not present

## 2018-11-08 DIAGNOSIS — Z510 Encounter for antineoplastic radiation therapy: Secondary | ICD-10-CM | POA: Diagnosis not present

## 2018-11-08 DIAGNOSIS — Z171 Estrogen receptor negative status [ER-]: Secondary | ICD-10-CM | POA: Diagnosis not present

## 2018-11-09 ENCOUNTER — Inpatient Hospital Stay: Payer: PPO

## 2018-11-09 ENCOUNTER — Other Ambulatory Visit: Payer: Self-pay

## 2018-11-09 ENCOUNTER — Ambulatory Visit
Admission: RE | Admit: 2018-11-09 | Discharge: 2018-11-09 | Disposition: A | Discharge status: Home / Self Care | Payer: PPO | Source: Ambulatory Visit | Attending: Radiation Oncology | Admitting: Radiation Oncology

## 2018-11-09 DIAGNOSIS — Z171 Estrogen receptor negative status [ER-]: Secondary | ICD-10-CM | POA: Diagnosis not present

## 2018-11-09 DIAGNOSIS — Z510 Encounter for antineoplastic radiation therapy: Secondary | ICD-10-CM | POA: Diagnosis not present

## 2018-11-09 DIAGNOSIS — C50212 Malignant neoplasm of upper-inner quadrant of left female breast: Secondary | ICD-10-CM | POA: Diagnosis not present

## 2018-11-09 LAB — BASIC METABOLIC PANEL
Anion gap: 9 (ref 5–15)
BUN: 12 mg/dL (ref 8–23)
CO2: 28 mmol/L (ref 22–32)
Calcium: 9.2 mg/dL (ref 8.9–10.3)
Chloride: 99 mmol/L (ref 98–111)
Creatinine, Ser: 0.8 mg/dL (ref 0.44–1.00)
GFR calc Af Amer: >60 mL/min (ref >60)
GFR calc non Af Amer: >60 mL/min (ref >60)
Glucose, Bld: 105 mg/dL — ABNORMAL HIGH (ref 70–99)
Potassium: 3.2 mmol/L — ABNORMAL LOW (ref 3.5–5.1)
Sodium: 136 mmol/L (ref 135–145)

## 2018-11-10 ENCOUNTER — Other Ambulatory Visit: Payer: Self-pay

## 2018-11-10 ENCOUNTER — Ambulatory Visit
Admission: RE | Admit: 2018-11-10 | Discharge: 2018-11-10 | Disposition: A | Discharge status: Home / Self Care | Payer: PPO | Source: Ambulatory Visit | Attending: Radiation Oncology | Admitting: Radiation Oncology

## 2018-11-10 DIAGNOSIS — Z510 Encounter for antineoplastic radiation therapy: Secondary | ICD-10-CM | POA: Diagnosis not present

## 2018-11-13 ENCOUNTER — Other Ambulatory Visit: Payer: Self-pay

## 2018-11-13 ENCOUNTER — Ambulatory Visit
Admission: RE | Admit: 2018-11-13 | Discharge: 2018-11-13 | Disposition: A | Discharge status: Home / Self Care | Payer: PPO | Source: Ambulatory Visit | Attending: Radiation Oncology | Admitting: Radiation Oncology

## 2018-11-13 DIAGNOSIS — Z510 Encounter for antineoplastic radiation therapy: Secondary | ICD-10-CM | POA: Diagnosis not present

## 2018-11-13 DIAGNOSIS — C50212 Malignant neoplasm of upper-inner quadrant of left female breast: Secondary | ICD-10-CM | POA: Diagnosis not present

## 2018-11-13 DIAGNOSIS — Z171 Estrogen receptor negative status [ER-]: Secondary | ICD-10-CM | POA: Diagnosis not present

## 2018-11-14 ENCOUNTER — Other Ambulatory Visit: Payer: Self-pay

## 2018-11-14 ENCOUNTER — Ambulatory Visit
Admission: RE | Admit: 2018-11-14 | Discharge: 2018-11-14 | Disposition: A | Discharge status: Home / Self Care | Payer: PPO | Source: Ambulatory Visit | Attending: Radiation Oncology | Admitting: Radiation Oncology

## 2018-11-14 DIAGNOSIS — C50212 Malignant neoplasm of upper-inner quadrant of left female breast: Secondary | ICD-10-CM | POA: Diagnosis not present

## 2018-11-14 DIAGNOSIS — Z171 Estrogen receptor negative status [ER-]: Secondary | ICD-10-CM | POA: Diagnosis not present

## 2018-11-14 DIAGNOSIS — Z510 Encounter for antineoplastic radiation therapy: Secondary | ICD-10-CM | POA: Diagnosis not present

## 2018-11-15 ENCOUNTER — Inpatient Hospital Stay: Payer: PPO

## 2018-11-15 ENCOUNTER — Ambulatory Visit
Admission: RE | Admit: 2018-11-15 | Discharge: 2018-11-15 | Disposition: A | Discharge status: Home / Self Care | Payer: PPO | Source: Ambulatory Visit | Attending: Radiation Oncology | Admitting: Radiation Oncology

## 2018-11-15 ENCOUNTER — Other Ambulatory Visit: Payer: Self-pay

## 2018-11-15 DIAGNOSIS — Z510 Encounter for antineoplastic radiation therapy: Secondary | ICD-10-CM | POA: Diagnosis not present

## 2018-11-15 DIAGNOSIS — C50212 Malignant neoplasm of upper-inner quadrant of left female breast: Secondary | ICD-10-CM | POA: Diagnosis not present

## 2018-11-15 DIAGNOSIS — Z171 Estrogen receptor negative status [ER-]: Secondary | ICD-10-CM | POA: Diagnosis not present

## 2018-11-15 DIAGNOSIS — C50919 Malignant neoplasm of unspecified site of unspecified female breast: Secondary | ICD-10-CM

## 2018-11-15 DIAGNOSIS — Z95828 Presence of other vascular implants and grafts: Secondary | ICD-10-CM

## 2018-11-15 LAB — CBC
HCT: 36.1 % (ref 36.0–46.0)
Hemoglobin: 11.9 g/dL — ABNORMAL LOW (ref 12.0–15.0)
MCH: 30.6 pg (ref 26.0–34.0)
MCHC: 33 g/dL (ref 30.0–36.0)
MCV: 92.8 fL (ref 80.0–100.0)
Platelets: 201 10*3/uL (ref 150–400)
RBC: 3.89 MIL/uL (ref 3.87–5.11)
RDW: 13.2 % (ref 11.5–15.5)
WBC: 4 10*3/uL (ref 4.0–10.5)
nRBC: 0 % (ref 0.0–0.2)

## 2018-11-15 MED ORDER — SODIUM CHLORIDE 0.9% FLUSH
10.0000 mL | Freq: Once | INTRAVENOUS | Status: AC
  Administered 2018-11-15: 10 mL via INTRAVENOUS
  Filled 2018-11-15: qty 10

## 2018-11-15 MED ORDER — HEPARIN SOD (PORK) LOCK FLUSH 100 UNIT/ML IV SOLN
500.0000 [IU] | Freq: Once | INTRAVENOUS | Status: AC
  Administered 2018-11-15: 500 [IU] via INTRAVENOUS
  Filled 2018-11-15: qty 5

## 2018-11-16 ENCOUNTER — Other Ambulatory Visit: Payer: Self-pay

## 2018-11-16 ENCOUNTER — Other Ambulatory Visit: Payer: Self-pay | Admitting: *Deleted

## 2018-11-16 ENCOUNTER — Ambulatory Visit
Admission: RE | Admit: 2018-11-16 | Discharge: 2018-11-16 | Disposition: A | Discharge status: Home / Self Care | Payer: PPO | Source: Ambulatory Visit | Attending: Radiation Oncology | Admitting: Radiation Oncology

## 2018-11-16 ENCOUNTER — Inpatient Hospital Stay: Payer: PPO

## 2018-11-16 ENCOUNTER — Telehealth: Payer: Self-pay

## 2018-11-16 DIAGNOSIS — Z171 Estrogen receptor negative status [ER-]: Secondary | ICD-10-CM | POA: Diagnosis not present

## 2018-11-16 DIAGNOSIS — E876 Hypokalemia: Secondary | ICD-10-CM

## 2018-11-16 DIAGNOSIS — C50212 Malignant neoplasm of upper-inner quadrant of left female breast: Secondary | ICD-10-CM | POA: Diagnosis not present

## 2018-11-16 DIAGNOSIS — Z510 Encounter for antineoplastic radiation therapy: Secondary | ICD-10-CM | POA: Diagnosis not present

## 2018-11-16 LAB — BASIC METABOLIC PANEL
Anion gap: 8 (ref 5–15)
BUN: 12 mg/dL (ref 8–23)
CO2: 29 mmol/L (ref 22–32)
Calcium: 8.9 mg/dL (ref 8.9–10.3)
Chloride: 103 mmol/L (ref 98–111)
Creatinine, Ser: 0.7 mg/dL (ref 0.44–1.00)
GFR calc Af Amer: >60 mL/min (ref >60)
GFR calc non Af Amer: >60 mL/min (ref >60)
Glucose, Bld: 121 mg/dL — ABNORMAL HIGH (ref 70–99)
Potassium: 3.5 mmol/L (ref 3.5–5.1)
Sodium: 140 mmol/L (ref 135–145)

## 2018-11-16 LAB — MAGNESIUM: Magnesium: 1.9 mg/dL (ref 1.7–2.4)

## 2018-11-16 MED ORDER — SILVER SULFADIAZINE 1 % EX CREA: 1.0000 "application " | TOPICAL_CREAM | Freq: Two times a day (BID) | CUTANEOUS | 2 refills | Status: DC

## 2018-11-16 NOTE — Telephone Encounter (Signed)
Patient informed K+ and mag labs are within normal limits today and no need for supplement infusion tomorrow.  Also advised that she does not need to have weekly lab checks moving forward.

## 2018-11-17 ENCOUNTER — Inpatient Hospital Stay: Payer: PPO

## 2018-11-17 ENCOUNTER — Other Ambulatory Visit: Payer: Self-pay

## 2018-11-17 ENCOUNTER — Ambulatory Visit
Admission: RE | Admit: 2018-11-17 | Discharge: 2018-11-17 | Disposition: A | Discharge status: Home / Self Care | Payer: PPO | Source: Ambulatory Visit | Attending: Radiation Oncology | Admitting: Radiation Oncology

## 2018-11-17 DIAGNOSIS — C50212 Malignant neoplasm of upper-inner quadrant of left female breast: Secondary | ICD-10-CM | POA: Diagnosis not present

## 2018-11-17 DIAGNOSIS — Z510 Encounter for antineoplastic radiation therapy: Secondary | ICD-10-CM | POA: Diagnosis not present

## 2018-11-17 DIAGNOSIS — Z171 Estrogen receptor negative status [ER-]: Secondary | ICD-10-CM | POA: Diagnosis not present

## 2018-11-20 ENCOUNTER — Other Ambulatory Visit: Payer: Self-pay

## 2018-11-20 ENCOUNTER — Ambulatory Visit
Admission: RE | Admit: 2018-11-20 | Discharge: 2018-11-20 | Disposition: A | Discharge status: Home / Self Care | Payer: PPO | Source: Ambulatory Visit | Attending: Radiation Oncology | Admitting: Radiation Oncology

## 2018-11-20 DIAGNOSIS — Z171 Estrogen receptor negative status [ER-]: Secondary | ICD-10-CM | POA: Insufficient documentation

## 2018-11-20 DIAGNOSIS — Z510 Encounter for antineoplastic radiation therapy: Secondary | ICD-10-CM | POA: Diagnosis not present

## 2018-11-20 DIAGNOSIS — C50212 Malignant neoplasm of upper-inner quadrant of left female breast: Secondary | ICD-10-CM | POA: Diagnosis not present

## 2018-11-21 ENCOUNTER — Other Ambulatory Visit: Payer: Self-pay

## 2018-11-21 ENCOUNTER — Ambulatory Visit
Admission: RE | Admit: 2018-11-21 | Discharge: 2018-11-21 | Disposition: A | Discharge status: Home / Self Care | Payer: PPO | Source: Ambulatory Visit | Attending: Radiation Oncology | Admitting: Radiation Oncology

## 2018-11-21 DIAGNOSIS — Z171 Estrogen receptor negative status [ER-]: Secondary | ICD-10-CM | POA: Diagnosis not present

## 2018-11-21 DIAGNOSIS — Z510 Encounter for antineoplastic radiation therapy: Secondary | ICD-10-CM | POA: Diagnosis not present

## 2018-11-21 DIAGNOSIS — C50212 Malignant neoplasm of upper-inner quadrant of left female breast: Secondary | ICD-10-CM | POA: Diagnosis not present

## 2018-11-22 ENCOUNTER — Ambulatory Visit: Payer: PPO

## 2018-11-22 ENCOUNTER — Ambulatory Visit
Admission: RE | Admit: 2018-11-22 | Discharge: 2018-11-22 | Disposition: A | Discharge status: Home / Self Care | Payer: PPO | Source: Ambulatory Visit | Attending: Radiation Oncology | Admitting: Radiation Oncology

## 2018-11-22 DIAGNOSIS — C50212 Malignant neoplasm of upper-inner quadrant of left female breast: Secondary | ICD-10-CM | POA: Diagnosis not present

## 2018-11-22 DIAGNOSIS — Z171 Estrogen receptor negative status [ER-]: Secondary | ICD-10-CM | POA: Diagnosis not present

## 2018-11-22 DIAGNOSIS — Z510 Encounter for antineoplastic radiation therapy: Secondary | ICD-10-CM | POA: Diagnosis not present

## 2018-11-23 ENCOUNTER — Ambulatory Visit
Admission: RE | Admit: 2018-11-23 | Discharge: 2018-11-23 | Disposition: A | Discharge status: Home / Self Care | Payer: PPO | Source: Ambulatory Visit | Attending: Radiation Oncology | Admitting: Radiation Oncology

## 2018-11-23 ENCOUNTER — Other Ambulatory Visit: Payer: Self-pay

## 2018-11-23 ENCOUNTER — Ambulatory Visit: Payer: PPO

## 2018-11-23 DIAGNOSIS — Z510 Encounter for antineoplastic radiation therapy: Secondary | ICD-10-CM | POA: Diagnosis not present

## 2018-11-24 ENCOUNTER — Ambulatory Visit
Admission: RE | Admit: 2018-11-24 | Discharge: 2018-11-24 | Disposition: A | Discharge status: Home / Self Care | Payer: PPO | Source: Ambulatory Visit | Attending: Radiation Oncology | Admitting: Radiation Oncology

## 2018-11-24 ENCOUNTER — Ambulatory Visit: Payer: PPO

## 2018-11-24 ENCOUNTER — Other Ambulatory Visit: Payer: Self-pay

## 2018-11-24 DIAGNOSIS — Z510 Encounter for antineoplastic radiation therapy: Secondary | ICD-10-CM | POA: Diagnosis not present

## 2018-11-27 ENCOUNTER — Ambulatory Visit: Payer: PPO

## 2018-11-27 ENCOUNTER — Other Ambulatory Visit: Payer: Self-pay

## 2018-11-27 DIAGNOSIS — Z510 Encounter for antineoplastic radiation therapy: Secondary | ICD-10-CM | POA: Diagnosis not present

## 2018-11-28 ENCOUNTER — Other Ambulatory Visit: Payer: Self-pay

## 2018-11-28 ENCOUNTER — Ambulatory Visit: Payer: PPO

## 2018-11-28 DIAGNOSIS — Z20822 Contact with and (suspected) exposure to covid-19: Secondary | ICD-10-CM

## 2018-11-29 ENCOUNTER — Inpatient Hospital Stay: Payer: PPO

## 2018-11-29 ENCOUNTER — Ambulatory Visit: Payer: PPO

## 2018-11-30 ENCOUNTER — Ambulatory Visit: Payer: PPO

## 2018-12-01 ENCOUNTER — Ambulatory Visit: Payer: PPO

## 2018-12-01 ENCOUNTER — Other Ambulatory Visit: Payer: Self-pay

## 2018-12-01 ENCOUNTER — Ambulatory Visit
Admission: RE | Admit: 2018-12-01 | Discharge: 2018-12-01 | Disposition: A | Discharge status: Home / Self Care | Payer: PPO | Source: Ambulatory Visit | Attending: Radiation Oncology | Admitting: Radiation Oncology

## 2018-12-01 DIAGNOSIS — Z510 Encounter for antineoplastic radiation therapy: Secondary | ICD-10-CM | POA: Diagnosis not present

## 2018-12-01 LAB — NOVEL CORONAVIRUS, NAA: SARS-CoV-2, NAA: NOT DETECTED

## 2018-12-04 ENCOUNTER — Other Ambulatory Visit: Payer: Self-pay

## 2018-12-04 ENCOUNTER — Ambulatory Visit: Payer: PPO

## 2018-12-04 DIAGNOSIS — Z510 Encounter for antineoplastic radiation therapy: Secondary | ICD-10-CM | POA: Diagnosis not present

## 2018-12-04 DIAGNOSIS — Z171 Estrogen receptor negative status [ER-]: Secondary | ICD-10-CM | POA: Diagnosis not present

## 2018-12-04 DIAGNOSIS — C50212 Malignant neoplasm of upper-inner quadrant of left female breast: Secondary | ICD-10-CM | POA: Diagnosis not present

## 2018-12-05 ENCOUNTER — Ambulatory Visit
Admission: RE | Admit: 2018-12-05 | Discharge: 2018-12-05 | Disposition: A | Discharge status: Home / Self Care | Payer: PPO | Source: Ambulatory Visit | Attending: Radiation Oncology | Admitting: Radiation Oncology

## 2018-12-05 ENCOUNTER — Other Ambulatory Visit: Payer: Self-pay

## 2018-12-05 ENCOUNTER — Ambulatory Visit: Payer: PPO

## 2018-12-05 DIAGNOSIS — Z510 Encounter for antineoplastic radiation therapy: Secondary | ICD-10-CM | POA: Diagnosis not present

## 2018-12-05 DIAGNOSIS — C50312 Malignant neoplasm of lower-inner quadrant of left female breast: Secondary | ICD-10-CM | POA: Diagnosis not present

## 2018-12-06 ENCOUNTER — Ambulatory Visit: Payer: PPO

## 2018-12-06 ENCOUNTER — Inpatient Hospital Stay: Payer: PPO | Attending: Oncology

## 2018-12-06 ENCOUNTER — Other Ambulatory Visit: Payer: Self-pay

## 2018-12-06 ENCOUNTER — Ambulatory Visit
Admission: RE | Admit: 2018-12-06 | Discharge: 2018-12-06 | Disposition: A | Discharge status: Home / Self Care | Payer: PPO | Source: Ambulatory Visit | Attending: Radiation Oncology | Admitting: Radiation Oncology

## 2018-12-06 DIAGNOSIS — Z510 Encounter for antineoplastic radiation therapy: Secondary | ICD-10-CM | POA: Diagnosis not present

## 2018-12-07 ENCOUNTER — Ambulatory Visit: Payer: PPO

## 2018-12-07 ENCOUNTER — Ambulatory Visit
Admission: RE | Admit: 2018-12-07 | Discharge: 2018-12-07 | Disposition: A | Discharge status: Home / Self Care | Payer: PPO | Source: Ambulatory Visit | Attending: Radiation Oncology | Admitting: Radiation Oncology

## 2018-12-07 ENCOUNTER — Other Ambulatory Visit: Payer: Self-pay

## 2018-12-07 DIAGNOSIS — Z171 Estrogen receptor negative status [ER-]: Secondary | ICD-10-CM | POA: Diagnosis not present

## 2018-12-07 DIAGNOSIS — Z510 Encounter for antineoplastic radiation therapy: Secondary | ICD-10-CM | POA: Diagnosis not present

## 2018-12-07 DIAGNOSIS — C50212 Malignant neoplasm of upper-inner quadrant of left female breast: Secondary | ICD-10-CM | POA: Diagnosis not present

## 2018-12-08 ENCOUNTER — Ambulatory Visit: Payer: PPO

## 2018-12-08 ENCOUNTER — Other Ambulatory Visit: Payer: Self-pay

## 2018-12-08 ENCOUNTER — Ambulatory Visit
Admission: RE | Admit: 2018-12-08 | Discharge: 2018-12-08 | Disposition: A | Discharge status: Home / Self Care | Payer: PPO | Source: Ambulatory Visit | Attending: Radiation Oncology | Admitting: Radiation Oncology

## 2018-12-08 DIAGNOSIS — C50212 Malignant neoplasm of upper-inner quadrant of left female breast: Secondary | ICD-10-CM | POA: Diagnosis not present

## 2018-12-08 DIAGNOSIS — Z510 Encounter for antineoplastic radiation therapy: Secondary | ICD-10-CM | POA: Diagnosis not present

## 2018-12-08 DIAGNOSIS — Z171 Estrogen receptor negative status [ER-]: Secondary | ICD-10-CM | POA: Diagnosis not present

## 2018-12-11 ENCOUNTER — Ambulatory Visit
Admission: RE | Admit: 2018-12-11 | Discharge: 2018-12-11 | Disposition: A | Discharge status: Home / Self Care | Payer: PPO | Source: Ambulatory Visit | Attending: Radiation Oncology | Admitting: Radiation Oncology

## 2018-12-11 ENCOUNTER — Ambulatory Visit: Payer: PPO

## 2018-12-11 DIAGNOSIS — Z171 Estrogen receptor negative status [ER-]: Secondary | ICD-10-CM | POA: Diagnosis not present

## 2018-12-11 DIAGNOSIS — C50212 Malignant neoplasm of upper-inner quadrant of left female breast: Secondary | ICD-10-CM | POA: Diagnosis not present

## 2018-12-11 DIAGNOSIS — Z510 Encounter for antineoplastic radiation therapy: Secondary | ICD-10-CM | POA: Diagnosis not present

## 2018-12-12 ENCOUNTER — Ambulatory Visit
Admission: RE | Admit: 2018-12-12 | Discharge: 2018-12-12 | Disposition: A | Discharge status: Home / Self Care | Payer: PPO | Source: Ambulatory Visit | Attending: Radiation Oncology | Admitting: Radiation Oncology

## 2018-12-12 ENCOUNTER — Ambulatory Visit: Payer: PPO

## 2018-12-12 DIAGNOSIS — C50212 Malignant neoplasm of upper-inner quadrant of left female breast: Secondary | ICD-10-CM | POA: Diagnosis not present

## 2018-12-12 DIAGNOSIS — Z510 Encounter for antineoplastic radiation therapy: Secondary | ICD-10-CM | POA: Diagnosis not present

## 2018-12-12 DIAGNOSIS — Z171 Estrogen receptor negative status [ER-]: Secondary | ICD-10-CM | POA: Diagnosis not present

## 2018-12-13 ENCOUNTER — Ambulatory Visit: Payer: PPO

## 2018-12-28 ENCOUNTER — Telehealth: Payer: Self-pay

## 2018-12-28 NOTE — Telephone Encounter (Signed)
Telephone call to patient to discuss SCP visit.  Patient states she is coming with a friend to chemo class on 12/15 and would like visit to be after chemo class.

## 2019-01-01 ENCOUNTER — Other Ambulatory Visit: Payer: Self-pay

## 2019-01-01 ENCOUNTER — Inpatient Hospital Stay: Payer: PPO

## 2019-01-01 DIAGNOSIS — Z171 Estrogen receptor negative status [ER-]: Secondary | ICD-10-CM | POA: Diagnosis not present

## 2019-01-01 DIAGNOSIS — C50919 Malignant neoplasm of unspecified site of unspecified female breast: Secondary | ICD-10-CM | POA: Diagnosis not present

## 2019-01-01 LAB — CBC WITH DIFFERENTIAL/PLATELET
Abs Immature Granulocytes: 0.01 K/uL (ref 0.00–0.07)
Basophils Absolute: 0 K/uL (ref 0.0–0.1)
Basophils Relative: 1 %
Eosinophils Absolute: 0.1 K/uL (ref 0.0–0.5)
Eosinophils Relative: 3 %
HCT: 40.3 % (ref 36.0–46.0)
Hemoglobin: 12.9 g/dL (ref 12.0–15.0)
Immature Granulocytes: 0 %
Lymphocytes Relative: 31 %
Lymphs Abs: 1.2 K/uL (ref 0.7–4.0)
MCH: 28.7 pg (ref 26.0–34.0)
MCHC: 32 g/dL (ref 30.0–36.0)
MCV: 89.6 fL (ref 80.0–100.0)
Monocytes Absolute: 0.4 K/uL (ref 0.1–1.0)
Monocytes Relative: 10 %
Neutro Abs: 2.1 K/uL (ref 1.7–7.7)
Neutrophils Relative %: 55 %
Platelets: 168 K/uL (ref 150–400)
RBC: 4.5 MIL/uL (ref 3.87–5.11)
RDW: 13 % (ref 11.5–15.5)
WBC: 3.8 K/uL — ABNORMAL LOW (ref 4.0–10.5)
nRBC: 0 % (ref 0.0–0.2)

## 2019-01-01 LAB — COMPREHENSIVE METABOLIC PANEL WITH GFR
ALT: 48 U/L — ABNORMAL HIGH (ref 0–44)
AST: 47 U/L — ABNORMAL HIGH (ref 15–41)
Albumin: 3.7 g/dL (ref 3.5–5.0)
Alkaline Phosphatase: 82 U/L (ref 38–126)
Anion gap: 10 (ref 5–15)
BUN: 15 mg/dL (ref 8–23)
CO2: 26 mmol/L (ref 22–32)
Calcium: 9 mg/dL (ref 8.9–10.3)
Chloride: 102 mmol/L (ref 98–111)
Creatinine, Ser: 0.72 mg/dL (ref 0.44–1.00)
GFR calc Af Amer: >60 mL/min (ref >60)
GFR calc non Af Amer: >60 mL/min (ref >60)
Glucose, Bld: 129 mg/dL — ABNORMAL HIGH (ref 70–99)
Potassium: 3.7 mmol/L (ref 3.5–5.1)
Sodium: 138 mmol/L (ref 135–145)
Total Bilirubin: 0.7 mg/dL (ref 0.3–1.2)
Total Protein: 6.6 g/dL (ref 6.5–8.1)

## 2019-01-02 ENCOUNTER — Inpatient Hospital Stay: Payer: PPO | Admitting: Oncology

## 2019-01-02 ENCOUNTER — Encounter: Payer: Self-pay | Admitting: Oncology

## 2019-01-02 ENCOUNTER — Inpatient Hospital Stay: Payer: PPO | Attending: Oncology | Admitting: Oncology

## 2019-01-02 ENCOUNTER — Other Ambulatory Visit: Payer: Self-pay

## 2019-01-02 ENCOUNTER — Other Ambulatory Visit: Payer: PPO

## 2019-01-02 DIAGNOSIS — R7401 Elevation of levels of liver transaminase levels: Secondary | ICD-10-CM | POA: Diagnosis not present

## 2019-01-02 DIAGNOSIS — Z17421 Hormone receptor negative with human epidermal growth factor receptor 2 negative status: Secondary | ICD-10-CM

## 2019-01-02 DIAGNOSIS — C50919 Malignant neoplasm of unspecified site of unspecified female breast: Secondary | ICD-10-CM

## 2019-01-02 NOTE — Progress Notes (Signed)
HEMATOLOGY-ONCOLOGY TeleHEALTH VISIT PROGRESS NOTE  I connected with Megan Harris on 01/02/19 at  1:45 PM EST by video enabled telemedicine visit and verified that I am speaking with the correct person using two identifiers. I discussed the limitations, risks, security and privacy concerns of performing an evaluation and management service by telemedicine and the availability of in-person appointments. I also discussed with the patient that there may be a patient responsible charge related to this service. The patient expressed understanding and agreed to proceed.   Other persons participating in the visit and their role in the encounter:  None  Patient's location: Home  Provider's location: office Chief Complaint: Stage Ib triple negative breast cancer   INTERVAL HISTORY Megan Harris is a 68 y.o. female who has above history reviewed by me today presents for follow up visit for management of  Problems and complaints are listed below:  Patient has finished radiation on 12/11/2018  She reports tolerating treatments.  Today she has no new complaints.  Feeling well.  [name] has completely resolved. Denies any shortness of breath, fever, chills, nausea, vomiting, chest pain or abdominal pain.  Denies any lower extremity swelling.  Review of Systems  Constitutional: Negative for appetite change, chills, fatigue and fever.  HENT:   Negative for hearing loss and voice change.   Eyes: Negative for eye problems.  Respiratory: Negative for chest tightness and cough.   Cardiovascular: Negative for chest pain.  Gastrointestinal: Negative for abdominal distention, abdominal pain and blood in stool.  Endocrine: Negative for hot flashes.  Genitourinary: Negative for difficulty urinating and frequency.   Musculoskeletal: Negative for arthralgias.  Skin: Negative for itching and rash.  Neurological: Negative for extremity weakness.  Hematological: Negative for adenopathy.  Psychiatric/Behavioral:  Negative for confusion.    Past Medical History:  Diagnosis Date  . Abdominal pain, left lower quadrant 2012  . Anemia    vitamin d deficiency  . Arthritis   . Asthma   . Back pain 2005   lower back arthritis  . Bone cancer ([place])   . Cancer ([employer]) 04/26/2018   80mm, T1c, N0 triple negative  . Chronic cholecystitis 2012   has had choleycystectomy.  not an issue  . Constipation 2005  . Endocrine problem 2005  . Family history of bone cancer   . Family history of brain cancer   . Family history of colon cancer   . Family history of lung cancer   . GERD (gastroesophageal reflux disease) 2012  . Hyperlipidemia 2012  . Hypertension 2002  . Nausea with vomiting 2012  . Obesity, unspecified 2012  . Pneumonia   . PONV (postoperative nausea and vomiting)   . Special screening for malignant neoplasms, colon 2012   Past Surgical History:  Procedure Laterality Date  . BILATERAL CARPAL TUNNEL RELEASE Bilateral   . BREAST BIOPSY Left 04/13/2018   Pending Path  . BREAST EXCISIONAL BIOPSY Left early 90s   neg  . BREAST LUMPECTOMY Left 04/26/2018   Procedure: BREAST LUMPECTOMY WITH EXCISION OF SENTINEL NODE;  Surgeon: Robert Bellow, MD;  Location: [facility] ORS;  Service: General;  Laterality: Left;  . BUNIONECTOMY Bilateral 2003  . CHOLECYSTECTOMY  06/04/2010  . COLON RESECTION  2004   due diverticulitis   . COLONOSCOPY  2005   [place], Dr. Bary Castilla  . COLONOSCOPY  06/28/2012  . COLOSTOMY  2004  . COLOSTOMY REVERSAL  2004  . FOOT SURGERY Bilateral 2012   plantar faciatis  . HAND SURGERY Bilateral  carpal tunnel  . HERNIA REPAIR  2005   at colostomy site after reversal done  . JOINT REPLACEMENT    . KNEE ARTHROPLASTY Right 04/20/2017   Procedure: COMPUTER ASSISTED TOTAL KNEE ARTHROPLASTY;  Surgeon: Dereck Leep, MD;  Location: [facility] ORS;  Service: Orthopedics;  Laterality: Right;  . KNEE ARTHROSCOPY Right 02/03/2015   Procedure: ARTHROSCOPY right knee, partial medial menisectomy,  condyle malleolus, patella and femoral;  Surgeon: Dereck Leep, MD;  Location: [facility] ORS;  Service: Orthopedics;  Laterality: Right;  . NASAL SINUS SURGERY     2005  . PORTACATH PLACEMENT Right 04/26/2018   Procedure: INSERTION PORT-A-CATH RIGHT;  Surgeon: Robert Bellow, MD;  Location: [facility] ORS;  Service: General;  Laterality: Right;  . SALPINGOOPHORECTOMY  1998  . TONSILLECTOMY    . TUBAL LIGATION  1978  . UPPER GI ENDOSCOPY  06/28/2012  . VAGINAL HYSTERECTOMY  1987    Family History  Problem Relation Age of Onset  . Heart disease Father   . Diabetes Paternal Grandmother   . Diabetes Paternal Grandfather   . Brain cancer Mother 47  . Bone cancer Sister 66  . Colon cancer Maternal Uncle        dx 50s-60s  . Throat cancer Cousin   . Leukemia Cousin   . Lung cancer Cousin   . Ovarian cancer Neg Hx     Social History   Socioeconomic History  . Marital status: Married    Spouse name: Francee Piccolo  . Number of children: 2  . Years of education: Not on file  . Highest education level: Not on file  Occupational History  . Occupation: worked at [employer] in [place]: retired  Tobacco Use  . Smoking status: Never Smoker  . Smokeless tobacco: Never Used  Substance and Sexual Activity  . Alcohol use: Not Currently  . Drug use: No  . Sexual activity: Yes    Birth control/protection: Surgical  Other Topics Concern  . Not on file  Social History Narrative  . Not on file   Social Determinants of Health   Financial Resource Strain:   . Difficulty of Paying Living Expenses: Not on file  Food Insecurity:   . Worried About Charity fundraiser in the Last Year: Not on file  . Ran Out of Food in the Last Year: Not on file  Transportation Needs:   . Lack of Transportation (Medical): Not on file  . Lack of Transportation (Non-Medical): Not on file  Physical Activity:   . Days of Exercise per Week: Not on file  . Minutes of Exercise per Session: Not on file  Stress:   .  Feeling of Stress : Not on file  Social Connections:   . Frequency of Communication with Friends and Family: Not on file  . Frequency of Social Gatherings with Friends and Family: Not on file  . Attends Religious Services: Not on file  . Active Member of Clubs or Organizations: Not on file  . Attends Archivist Meetings: Not on file  . Marital Status: Not on file  Intimate Partner Violence:   . Fear of Current or Ex-Partner: Not on file  . Emotionally Abused: Not on file  . Physically Abused: Not on file  . Sexually Abused: Not on file    Current Outpatient Medications on File Prior to Visit  Medication Sig Dispense Refill  . acetaminophen (TYLENOL) 500 MG tablet Take 500 mg by mouth every 6 (six)  hours as needed.    Marland Kitchen albuterol (PROVENTIL HFA;VENTOLIN HFA) 108 (90 Base) MCG/ACT inhaler Inhale 2 puffs into the lungs every 6 (six) hours as needed for wheezing or shortness of breath.     Marland Kitchen amLODipine (NORVASC) 5 MG tablet Take 5 mg by mouth daily.    Marland Kitchen atorvastatin (LIPITOR) 10 MG tablet Take 10 mg by mouth daily.    . Cholecalciferol (VITAMIN D3 MAXIMUM STRENGTH) 5000 units capsule Take 5,000 Units by mouth daily.    Marland Kitchen gabapentin (NEURONTIN) 300 MG capsule TAKE 2 CAPSULES BY MOUTH 3 TIMES DAILY (Patient taking differently: Take 300 mg by mouth 4 (four) times daily. ) 180 capsule 0  . hydrochlorothiazide (HYDRODIURIL) 25 MG tablet Take 25 mg by mouth daily.    . lansoprazole (PREVACID) 30 MG capsule Take 30 mg by mouth 2 (two) times daily.     Marland Kitchen levothyroxine (SYNTHROID, LEVOTHROID) 50 MCG tablet Take 50 mcg by mouth daily before breakfast.     . lidocaine-prilocaine (EMLA) cream Apply to affected area once 30 g 3  . losartan (COZAAR) 100 MG tablet Take 100 mg by mouth daily.    . promethazine (PHENERGAN) 25 MG tablet Take 1 tablet (25 mg total) by mouth every 6 (six) hours as needed for nausea or vomiting. 60 tablet 1  . silver sulfADIAZINE (SILVADENE) 1 % cream Apply 1  application topically 2 (two) times daily. 50 g 2  . zolpidem (AMBIEN) 10 MG tablet Take 10 mg by mouth at bedtime.     . magnesium chloride (SLOW-MAG) 64 MG TBEC SR tablet Take 1 tablet (64 mg total) by mouth 2 (two) times daily. (Patient not taking: Reported on 01/02/2019) 60 tablet 0   No current facility-administered medications on file prior to visit.    Allergies  Allergen Reactions  . Codeine Other (See Comments)    Goes to sleep and unable to wake up.  Marland Kitchen Penicillins Hives    Did it involve swelling of the face/tongue/throat, SOB, or low BP? No Did it involve sudden or severe rash/hives, skin peeling, or any reaction on the inside of your mouth or nose? No Did you need to seek medical attention at a hospital or doctor's office? Yes When did it last happen?50s If all above answers are "NO", may proceed with cephalosporin use.        Observations/Objective: Today's Vitals   01/02/19 1407  PainSc: 0-No pain   There is no height or weight on file to calculate BMI.  Physical Exam  Constitutional: She is oriented to person, place, and time. No distress.  Neurological: She is alert and oriented to person, place, and time.  Psychiatric: Mood normal.    CBC    Component Value Date/Time   WBC 3.8 (L) 01/01/2019 1139   RBC 4.50 01/01/2019 1139   HGB 12.9 01/01/2019 1139   HCT 40.3 01/01/2019 1139   PLT 168 01/01/2019 1139   MCV 89.6 01/01/2019 1139   MCH 28.7 01/01/2019 1139   MCHC 32.0 01/01/2019 1139   RDW 13.0 01/01/2019 1139   LYMPHSABS 1.2 01/01/2019 1139   MONOABS 0.4 01/01/2019 1139   EOSABS 0.1 01/01/2019 1139   BASOSABS 0.0 01/01/2019 1139    CMP     Component Value Date/Time   NA 138 01/01/2019 1139   K 3.7 01/01/2019 1139   CL 102 01/01/2019 1139   CO2 26 01/01/2019 1139   GLUCOSE 129 (H) 01/01/2019 1139   BUN 15 01/01/2019 1139   CREATININE  0.72 01/01/2019 1139   CALCIUM 9.0 01/01/2019 1139   PROT 6.6 01/01/2019 1139   ALBUMIN 3.7 01/01/2019  1139   AST 47 (H) 01/01/2019 1139   ALT 48 (H) 01/01/2019 1139   ALKPHOS 82 01/01/2019 1139   BILITOT 0.7 01/01/2019 1139   GFRNONAA >60 01/01/2019 1139   GFRAA >60 01/01/2019 1139     Assessment and Plan: 1. 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 ([place])   2. Transaminitis   Triple negative breast cancer, stage IB She now has finished all adjuvant treatments. Labs reviewed and discussed with patient. Very mild leukopenia with normal ANC. Continue history and physical every 3 months.  Chronic transaminitis, pre-existing to chemotherapy.  Previous ultrasound in 2012 indicates probable fatty liver disease.  I will obtain ultrasound liver for further evaluation.  Follow Up Instructions: 3 months   I discussed the assessment and treatment plan with the patient. The patient was provided an opportunity to ask questions and all were answered. The patient agreed with the plan and demonstrated an understanding of the instructions.  The patient was advised to call back or seek an in-person evaluation if the symptoms worsen or if the condition fails to improve as anticipated.   I provided 15 minutes of face-to-face video visit time during this encounter, and > 50% was spent counseling as documented under my assessment & plan.  Earlie Server, MD 01/02/2019 9:17 PM

## 2019-01-02 NOTE — Progress Notes (Signed)
Patient reports neuropathy and has spoken to [facility] who is going to set her up for accupuncture.

## 2019-01-02 NOTE — Progress Notes (Signed)
Survivorship Care Plan visit completed.  Treatment summary reviewed and given to patient.  ASCO answers booklet reviewed and given to patient.  CARE program and Cancer Transitions discussed with patient along with other resources cancer center offers to patients and caregivers.  Patient verbalized understanding.    

## 2019-01-02 NOTE — Progress Notes (Signed)
CLINIC:  Survivorship   REASON FOR VISIT:  Routine follow-up for history of breast cancer.   BRIEF ONCOLOGIC HISTORY:  Oncology History Overview Note  # Stage IB left triple negative breast cancer   03/27/2018 which showed left breast mass. She had diagnostic and ultrasound on 04/07/2018 which showed 0.9cm x 0.9cm x 1.4cm irregular border mixed echotexture mass at the left breast 11 o'clock 7 cm from nipple correlating to the mammographic finding. Ultrasound of the left axilla is negative She underwent biopsy of left breast mass. 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 Estrogen receptor negative, progesterone receptor negative, HER-2 negative.   S/p left lumpectomy and sentinel lymph node biopsy. pT1pN0 05/09/2018 Baseline MUGA showed no focal wall motion abnormality of the left ventricle.  Calculated LVEF 61%   # 05/24/2018- 07/05/2018 Adjuvant Chemotherapy ddAC- # 07/19/2018 started on weekly Taxol 80 mg/m. Starting cycle 4, Taxol was decreased to 67m/m2 # 08/23/2018 Cycle cycle 6, Taxol was decreased to 65 mg/m due to neuropathy.     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 ([facility]  04/28/2018 Cancer Staging   Staging form: Breast, AJCC 8th Edition - Clinical stage from 04/28/2018: Stage IB (cT1c, cN0, cM0, G3, ER-, PR-, HER2-) - Signed by YEarlie Server MD on 04/29/2018   04/29/2018 Initial Diagnosis   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 ([facility]   05/24/2018 - 07/18/2018 Chemotherapy   The patient had DOXOrubicin (ADRIAMYCIN) chemo injection 136 mg, 60 mg/m2 = 136 mg, Intravenous,  Once, 4 of 4 cycles Administration: 136 mg (05/24/2018), 136 mg (06/07/2018), 136 mg (06/21/2018), 136 mg (07/05/2018) palonosetron (ALOXI) injection 0.25 mg, 0.25 mg, Intravenous,  Once, 4 of 4 cycles Administration: 0.25 mg (05/24/2018), 0.25 mg (06/07/2018), 0.25 mg (06/21/2018), 0.25 mg (07/05/2018) pegfilgrastim  (NEULASTA ONPRO KIT) injection 6 mg, 6 mg, Subcutaneous, Once, 4 of 4 cycles Administration: 6 mg (05/24/2018), 6 mg (06/07/2018), 6 mg (06/21/2018), 6 mg (07/05/2018) cyclophosphamide (CYTOXAN) 1,360 mg in sodium chloride 0.9 % 250 mL chemo infusion, 600 mg/m2 = 1,360 mg, Intravenous,  Once, 4 of 4 cycles Administration: 1,360 mg (05/24/2018), 1,360 mg (06/07/2018), 1,360 mg (06/21/2018), 1,360 mg (07/05/2018) fosaprepitant (EMEND) 150 mg, dexamethasone (DECADRON) 12 mg in sodium chloride 0.9 % 145 mL IVPB, , Intravenous,  Once, 4 of 4 cycles Administration:  (05/24/2018),  (06/07/2018),  (06/21/2018),  (07/05/2018)  for chemotherapy treatment.    05/25/2018 Cancer Staging   Staging form: Breast, AJCC 8th Edition - Pathologic stage from 05/25/2018: Stage IB (pT1c, pN0(sn), cM0, G3, ER-, PR-, HER2-) - Signed by YEarlie Server MD on 05/25/2018   07/18/2018 - 07/18/2018 Chemotherapy   The patient had [No matching medication found in this treatment plan]  for chemotherapy treatment.    07/19/2018 -  Chemotherapy   The patient had DOXORUBICIN HCL CHEMO IV INJECTION 2 MG/ML, 60 mg/m2, Intravenous,  Once, 0 of 4 cycles PALONOSETRON HCL INJECTION 0.25 MG/5ML, 0.25 mg, Intravenous,  Once, 0 of 4 cycles PEGFILGRASTIM-CBQV 6 MG/0.6ML Megan Harris SOSY, 6 mg, Subcutaneous, Once, 0 of 4 cycles CYCLOPHOSPHAMIDE CHEMO IV INFUSION ORDERABLE, 600 mg/m2, Intravenous,  Once, 0 of 4 cycles PACLITAXEL CHEMO IV INFUSION ORDERABLE (</= 80 MG/M2), 80 mg/m2, Intravenous,  Once, 0 of 12 cycles FOSAPREPITANT 150MG + DEXAMETHASONE INFUSION [facility], , Intravenous,  Once, 0 of 4 cycles  for chemotherapy treatment.    07/19/2018 -  Chemotherapy   The patient had DOXORUBICIN HCL CHEMO IV INJECTION 2 MG/ML, 60 mg/m2,  Intravenous,  Once, 0 of 4 cycles PALONOSETRON HCL INJECTION 0.25 MG/5ML, 0.25 mg, Intravenous,  Once, 0 of 4 cycles PEGFILGRASTIM-CBQV 6 MG/0.6ML Sebastian SOSY, 6 mg, Subcutaneous, Once, 0 of 4 cycles CYCLOPHOSPHAMIDE CHEMO IV INFUSION ORDERABLE, 600 mg/m2,  Intravenous,  Once, 0 of 4 cycles PACLITAXEL CHEMO IV INFUSION ORDERABLE (</= 80 MG/M2), 80 mg/m2, Intravenous,  Once, 0 of 12 cycles FOSAPREPITANT 150MG + DEXAMETHASONE INFUSION [facility], , Intravenous,  Once, 0 of 4 cycles  for chemotherapy treatment.    07/19/2018 -  Chemotherapy   The patient had PACLitaxel (TAXOL) 180 mg in sodium chloride 0.9 % 250 mL chemo infusion (</= 12m/m2), 80 mg/m2 = 180 mg, Intravenous,  Once, 12 of 12 cycles Dose modification: 70 mg/m2 (original dose 80 mg/m2, Cycle 4, Reason: Dose not tolerated, Comment: neuropathy), 65 mg/m2 (original dose 80 mg/m2, Cycle 6, Reason: Dose not tolerated, Comment: neuropathy), 60 mg/m2 (original dose 80 mg/m2, Cycle 8, Reason: Dose not tolerated) Administration: 180 mg (07/19/2018), 180 mg (07/26/2018), 180 mg (08/02/2018), 156 mg (08/09/2018), 156 mg (08/16/2018), 144 mg (08/23/2018), 144 mg (08/30/2018), 132 mg (09/06/2018), 132 mg (09/13/2018), 132 mg (09/20/2018), 132 mg (09/27/2018), 132 mg (10/04/2018)  for chemotherapy treatment.     Imaging       INTERVAL HISTORY:  [name] to the Survivorship Clinic today for routine follow-up for her history of breast cancer.  Overall, she reports feeling quite well.  She continues to complain of neuropathy.  She is currently taking gabapentin which was recently increased.  States her neuropathy has not improved.  It is not any worse but it is not any better.  States she can still button her shirt and occasionally has trouble picking things off the floor.  States "They just feel funny".  Otherwise she is doing well.  Her appetite has been stable.  She denies any recent fevers or illness, shortness of breath or chest pain, constipation or diarrhea, nausea or vomiting or any urinary complaints.  REVIEW OF SYSTEMS:  Review of Systems  Constitutional: Negative.  Negative for appetite change, chills, fatigue and fever.  HENT:  Negative.  Negative for hearing loss, lump/mass, mouth sores and nosebleeds.    Eyes: Negative.  Negative for eye problems.  Respiratory: Negative for cough, hemoptysis and shortness of breath.   Cardiovascular: Negative.  Negative for chest pain and leg swelling.  Gastrointestinal: Negative.  Negative for abdominal pain, blood in stool, constipation, diarrhea, nausea and vomiting.  Endocrine: Negative.  Negative for hot flashes.  Genitourinary: Negative.  Negative for bladder incontinence, difficulty urinating, dysuria, frequency and hematuria.   Musculoskeletal: Negative.  Negative for back pain, flank pain, gait problem and myalgias.  Skin: Negative.  Negative for itching and rash.  Neurological: Positive for numbness. Negative for dizziness, gait problem, headaches and light-headedness.  Hematological: Negative.  Negative for adenopathy.  Psychiatric/Behavioral: Negative for confusion. The patient is not nervous/anxious.     PAST MEDICAL/SURGICAL HISTORY:  Past Medical History:  Diagnosis Date  . Abdominal pain, left lower quadrant 2012  . Anemia    vitamin d deficiency  . Arthritis   . Asthma   . Back pain 2005   lower back arthritis  . Bone cancer ([facility]   . Cancer ([facility] 04/26/2018   133m T1c, N0 triple negative  . Chronic cholecystitis 2012   has had choleycystectomy.  not an issue  . Constipation 2005  . Endocrine problem 2005  . Family history of bone cancer   . Family history of  brain cancer   . Family history of colon cancer   . Family history of lung cancer   . GERD (gastroesophageal reflux disease) 2012  . Hyperlipidemia 2012  . Hypertension 2002  . Nausea with vomiting 2012  . Obesity, unspecified 2012  . Pneumonia   . PONV (postoperative nausea and vomiting)   . Special screening for malignant neoplasms, colon 2012   Past Surgical History:  Procedure Laterality Date  . BILATERAL CARPAL TUNNEL RELEASE Bilateral   . BREAST BIOPSY Left 04/13/2018   Pending Path  . BREAST EXCISIONAL BIOPSY Left early 90s   neg  . BREAST LUMPECTOMY  Left 04/26/2018   Procedure: BREAST LUMPECTOMY WITH EXCISION OF SENTINEL NODE;  Surgeon: Robert Bellow, MD;  Location: [facility] ORS;  Service: General;  Laterality: Left;  . BUNIONECTOMY Bilateral 2003  . CHOLECYSTECTOMY  06/04/2010  . COLON RESECTION  2004   due diverticulitis   . COLONOSCOPY  2005   [place], Dr. Bary Castilla  . COLONOSCOPY  06/28/2012  . COLOSTOMY  2004  . COLOSTOMY REVERSAL  2004  . FOOT SURGERY Bilateral 2012   plantar faciatis  . HAND SURGERY Bilateral    carpal tunnel  . HERNIA REPAIR  2005   at colostomy site after reversal done  . JOINT REPLACEMENT    . KNEE ARTHROPLASTY Right 04/20/2017   Procedure: COMPUTER ASSISTED TOTAL KNEE ARTHROPLASTY;  Surgeon: Dereck Leep, MD;  Location: [facility] ORS;  Service: Orthopedics;  Laterality: Right;  . KNEE ARTHROSCOPY Right 02/03/2015   Procedure: ARTHROSCOPY right knee, partial medial menisectomy, condyle malleolus, patella and femoral;  Surgeon: Dereck Leep, MD;  Location: [facility] ORS;  Service: Orthopedics;  Laterality: Right;  . NASAL SINUS SURGERY     2005  . PORTACATH PLACEMENT Right 04/26/2018   Procedure: INSERTION PORT-A-CATH RIGHT;  Surgeon: Robert Bellow, MD;  Location: [facility] ORS;  Service: General;  Laterality: Right;  . SALPINGOOPHORECTOMY  1998  . TONSILLECTOMY    . TUBAL LIGATION  1978  . UPPER GI ENDOSCOPY  06/28/2012  . VAGINAL HYSTERECTOMY  1987     ALLERGIES:  Allergies  Allergen Reactions  . Codeine Other (See Comments)    Goes to sleep and unable to wake up.  Marland Kitchen Penicillins Hives    Did it involve swelling of the face/tongue/throat, SOB, or low BP? No Did it involve sudden or severe rash/hives, skin peeling, or any reaction on the inside of your mouth or nose? No Did you need to seek medical attention at a hospital or doctor's office? Yes When did it last happen?50s If all above answers are "NO", may proceed with cephalosporin use.      CURRENT MEDICATIONS:  Outpatient Encounter Medications  as of 01/02/2019  Medication Sig  . acetaminophen (TYLENOL) 500 MG tablet Take 500 mg by mouth every 6 (six) hours as needed.  Marland Kitchen albuterol (PROVENTIL HFA;VENTOLIN HFA) 108 (90 Base) MCG/ACT inhaler Inhale 2 puffs into the lungs every 6 (six) hours as needed for wheezing or shortness of breath.   Marland Kitchen amLODipine (NORVASC) 5 MG tablet Take 5 mg by mouth daily.  Marland Kitchen atorvastatin (LIPITOR) 10 MG tablet Take 10 mg by mouth daily.  . Cholecalciferol (VITAMIN D3 MAXIMUM STRENGTH) 5000 units capsule Take 5,000 Units by mouth daily.  Marland Kitchen gabapentin (NEURONTIN) 300 MG capsule TAKE 2 CAPSULES BY MOUTH 3 TIMES DAILY (Patient taking differently: Take 300 mg by mouth 4 (four) times daily. )  . hydrochlorothiazide (HYDRODIURIL) 25 MG tablet Take 25 mg  by mouth daily.  . lansoprazole (PREVACID) 30 MG capsule Take 30 mg by mouth 2 (two) times daily.   Marland Kitchen levothyroxine (SYNTHROID, LEVOTHROID) 50 MCG tablet Take 50 mcg by mouth daily before breakfast.   . lidocaine-prilocaine (EMLA) cream Apply to affected area once  . losartan (COZAAR) 100 MG tablet Take 100 mg by mouth daily.  . magnesium chloride (SLOW-MAG) 64 MG TBEC SR tablet Take 1 tablet (64 mg total) by mouth 2 (two) times daily. (Patient not taking: Reported on 01/02/2019)  . promethazine (PHENERGAN) 25 MG tablet Take 1 tablet (25 mg total) by mouth every 6 (six) hours as needed for nausea or vomiting.  . silver sulfADIAZINE (SILVADENE) 1 % cream Apply 1 application topically 2 (two) times daily.  Marland Kitchen zolpidem (AMBIEN) 10 MG tablet Take 10 mg by mouth at bedtime.    No facility-administered encounter medications on file as of 01/02/2019.   ONCOLOGIC FAMILY HISTORY:  Family History  Problem Relation Age of Onset  . Heart disease Father   . Diabetes Paternal Grandmother   . Diabetes Paternal Grandfather   . Brain cancer Mother 26  . Bone cancer Sister 55  . Colon cancer Maternal Uncle        dx 50s-60s  . Throat cancer Cousin   . Leukemia Cousin   . Lung  cancer Cousin   . Ovarian cancer Neg Hx     GENETIC COUNSELING/TESTING: None  SOCIAL HISTORY:  Megan Harris is married and lives with her husband in [place], [place]. Mrs.  Harris is currently retired.  She denies any current or history of tobacco, alcohol, or illicit drug use.     PHYSICAL EXAMINATION:  Vital Signs: There were no vitals filed for this visit. There were no vitals filed for this visit. Limited due to telephone visit  LABORATORY DATA:  Lab Results  Component Value Date   WBC 3.8 (L) 01/01/2019   HGB 12.9 01/01/2019   HCT 40.3 01/01/2019   MCV 89.6 01/01/2019   PLT 168 01/01/2019     Chemistry      Component Value Date/Time   NA 138 01/01/2019 1139   K 3.7 01/01/2019 1139   CL 102 01/01/2019 1139   CO2 26 01/01/2019 1139   BUN 15 01/01/2019 1139   CREATININE 0.72 01/01/2019 1139      Component Value Date/Time   CALCIUM 9.0 01/01/2019 1139   ALKPHOS 82 01/01/2019 1139   AST 47 (H) 01/01/2019 1139   ALT 48 (H) 01/01/2019 1139   BILITOT 0.7 01/01/2019 1139     DIAGNOSTIC IMAGING:  Has not had a repeat mammogram since surgery.  Mammogram  04/20/18  Ultrasound exam was undertaken to determine that wire localization would not be required prior to surgical intervention. The breast is essentially fatty replaced on mammography and the only lesion identified during scanning of the upper inner quadrant was at the 11 o'clock position, 10 cm from the nipple where a hypoechoic mass measuring 0.86 x 1.02 x 1.21 cm with evidence of a metallic clip was identified. This corresponds with the [place] study pre-biopsy. The lesion comes within 1 cm of the overlying skin. BI-RADS-6.   ASSESSMENT AND PLAN:  Megan Harris is a pleasant 68 y.o. female with history of triple negative stage Ib right/left breast cancer, diagnosed on 04/07/2018 by ultrasound.  Underwent biopsy of left breast mass revealing invasive mammary carcinoma triple negative.  She is status post left lung  lobectomy and sentinel lymph node biopsy.  Received  adjuvant chemotherapy with dose dense AC.  Slight dose reduction secondary to toxicities mainly neuropathy declined genetic testing. She presents to the Survivorship Clinic for surveillance and routine follow-up.   1. History of breast cancer:  Megan Harris is currently clinically and radiographically without evidence of disease or recurrence of breast cancer. She will be due for mammogram in April 2021; Dr. Tasia Catchings will place at next visit.  She will return to the cancer center to see her medical oncologist, Dr. Tasia Catchings , on 04/03/19.  I encouraged her to call me with any questions or concerns before her next visit at the cancer center, and I would be happy to see her sooner, if needed.    #. 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   Accupuncture trial.   #. Bone health:  Given Megan Harris's age, history of breast cancer she is at risk for bone demineralization. She has not had a DEXA scan. In the meantime, she was encouraged to increase her consumption of foods rich in calcium, as well as increase her weight-bearing activities.  She was given education on specific food and activities to promote bone health.  #. Cancer screening:  Due to Megan Harris's history and her age, she should receive screening for skin cancers, colon cancer, and gynecologic cancers. She was encouraged to follow-up with her PCP for appropriate cancer screenings.   #. Health maintenance and wellness promotion: Megan Harris was encouraged to consume 5-7 servings of fruits and vegetables per day. She was also encouraged to engage in moderate to vigorous exercise for 30 minutes per day most days of the week. She was instructed to limit her alcohol consumption and continue to abstain from tobacco use.    Dispo:  -RTC on 04/03/19 for follow-up with Dr. Tasia Catchings. -RTC on 01/24/2019 for follow-up with Dr. Donella Stade  A total of (30) minutes of  face-to-face time was spent with this patient with greater than 50% of that time in counseling and care-coordination.  Rulon Abide NP AGNP-C [place] [phone]   Note: PRIMARY CARE PROVIDER Idelle Crouch, [place] [phone]

## 2019-01-05 ENCOUNTER — Ambulatory Visit: Payer: PPO

## 2019-01-08 ENCOUNTER — Ambulatory Visit: Payer: PPO | Attending: Internal Medicine

## 2019-01-08 ENCOUNTER — Other Ambulatory Visit: Payer: PPO

## 2019-01-08 DIAGNOSIS — Z20822 Contact with and (suspected) exposure to covid-19: Secondary | ICD-10-CM

## 2019-01-08 DIAGNOSIS — Z20828 Contact with and (suspected) exposure to other viral communicable diseases: Secondary | ICD-10-CM | POA: Diagnosis not present

## 2019-01-09 LAB — NOVEL CORONAVIRUS, NAA: SARS-CoV-2, NAA: NOT DETECTED

## 2019-01-15 ENCOUNTER — Ambulatory Visit: Admission: RE | Admit: 2019-01-15 | Payer: PPO | Source: Ambulatory Visit

## 2019-01-15 ENCOUNTER — Telehealth: Payer: Self-pay | Admitting: *Deleted

## 2019-01-15 NOTE — Telephone Encounter (Signed)
US called to report that patient did not show up for her appointment this morning ?

## 2019-01-17 NOTE — Telephone Encounter (Signed)
LC, please get U/S rescheduled.

## 2019-01-17 NOTE — Telephone Encounter (Signed)
Done...  [place] has been R/S to 01/24/19 as requested and pt is aware.

## 2019-01-21 ENCOUNTER — Other Ambulatory Visit: Payer: Self-pay | Admitting: General Surgery

## 2019-01-21 DIAGNOSIS — C50212 Malignant neoplasm of upper-inner quadrant of left female breast: Secondary | ICD-10-CM

## 2019-01-21 DIAGNOSIS — Z171 Estrogen receptor negative status [ER-]: Secondary | ICD-10-CM

## 2019-01-24 ENCOUNTER — Other Ambulatory Visit: Payer: Self-pay

## 2019-01-24 ENCOUNTER — Ambulatory Visit
Admission: RE | Admit: 2019-01-24 | Discharge: 2019-01-24 | Disposition: A | Discharge status: Home / Self Care | Payer: PPO | Source: Ambulatory Visit | Attending: Radiation Oncology | Admitting: Radiation Oncology

## 2019-01-24 ENCOUNTER — Ambulatory Visit
Admission: RE | Admit: 2019-01-24 | Discharge: 2019-01-24 | Disposition: A | Discharge status: Home / Self Care | Payer: PPO | Source: Ambulatory Visit | Attending: Oncology | Admitting: Oncology

## 2019-01-24 ENCOUNTER — Encounter: Payer: Self-pay | Admitting: Radiation Oncology

## 2019-01-24 DIAGNOSIS — K760 Fatty (change of) liver, not elsewhere classified: Secondary | ICD-10-CM | POA: Diagnosis not present

## 2019-01-24 DIAGNOSIS — Z171 Estrogen receptor negative status [ER-]: Secondary | ICD-10-CM | POA: Insufficient documentation

## 2019-01-24 DIAGNOSIS — Z923 Personal history of irradiation: Secondary | ICD-10-CM | POA: Diagnosis not present

## 2019-01-24 DIAGNOSIS — L818 Other specified disorders of pigmentation: Secondary | ICD-10-CM | POA: Diagnosis not present

## 2019-01-24 DIAGNOSIS — C50919 Malignant neoplasm of unspecified site of unspecified female breast: Secondary | ICD-10-CM

## 2019-01-24 DIAGNOSIS — C50212 Malignant neoplasm of upper-inner quadrant of left female breast: Secondary | ICD-10-CM | POA: Insufficient documentation

## 2019-01-24 DIAGNOSIS — R7401 Elevation of levels of liver transaminase levels: Secondary | ICD-10-CM

## 2019-01-24 NOTE — Progress Notes (Signed)
Radiation Oncology Follow up Note  Name: Megan Harris   Date:   01/24/2019 MRN:  [id number] DOB: 1950-10-18    This 69 y.o. female presents to the clinic today for 1 month follow-up status post whole breast radiation to her left breast for stage I triple negative invasive mammary carcinoma status post wide local excision.  REFERRING PROVIDER: Idelle Crouch, MD  HPI: Patient is a 69 year old female now out 1 month and completed whole breast radiation to her left breast status post wide local excision for a stage I triple negative invasive mammary carcinoma.  Seen today in routine follow-up she is doing well still has some slight tenderness in the breast there is also still some hyperpigmentation of the skin.  She is otherwise without complaints.  She specifically denies cough or bone pain.  She is not on antiestrogen therapy based on the triple negative nature of her disease..  COMPLICATIONS OF TREATMENT: none  FOLLOW UP COMPLIANCE: keeps appointments   PHYSICAL EXAM:  BP (P) 132/73 (BP Location: Right Arm, Patient Position: Sitting)   Pulse (P) 75   Resp (P) 16   Wt (P) 250 lb (113.4 kg)   BMI (P) 41.60 kg/m  Lungs are clear to A&P cardiac examination essentially unremarkable with regular rate and rhythm. No dominant mass or nodularity is noted in either breast in 2 positions examined. Incision is well-healed. No axillary or supraclavicular adenopathy is appreciated. Cosmetic result is excellent.  Still some hyperpigmentation of the skin although cosmetic result is still excellent.  Well-developed well-nourished patient in NAD. HEENT reveals PERLA, EOMI, discs not visualized.  Oral cavity is clear. No oral mucosal lesions are identified. Neck is clear without evidence of cervical or supraclavicular adenopathy. Lungs are clear to A&P. Cardiac examination is essentially unremarkable with regular rate and rhythm without murmur rub or thrill. Abdomen is benign with no organomegaly or masses  noted. Motor sensory and DTR levels are equal and symmetric in the upper and lower extremities. Cranial nerves II through XII are grossly intact. Proprioception is intact. No peripheral adenopathy or edema is identified. No motor or sensory levels are noted. Crude visual fields are within normal range.  RADIOLOGY RESULTS: No current films for review  PLAN: Present time patient is doing well 1 month out from whole breast radiation and pleased with her overall progress I explained to her the tenderness will subside over time.  Also the hyperpigmentation of her skin will also improve.  I have asked to see her back in 4 to 5 months for follow-up.  She continues close follow-up care with Dr. Tollie Pizza who will be ordering her follow-up mammograms.  Patient knows to call with any concerns.  I would like to take this opportunity to thank you for allowing me to participate in the care of your patient.Noreene Filbert, MD

## 2019-01-25 ENCOUNTER — Ambulatory Visit: Payer: PPO | Attending: Internal Medicine

## 2019-01-25 DIAGNOSIS — Z20822 Contact with and (suspected) exposure to covid-19: Secondary | ICD-10-CM | POA: Diagnosis not present

## 2019-01-27 LAB — NOVEL CORONAVIRUS, NAA: SARS-CoV-2, NAA: NOT DETECTED

## 2019-01-29 DIAGNOSIS — D2272 Melanocytic nevi of left lower limb, including hip: Secondary | ICD-10-CM | POA: Diagnosis not present

## 2019-01-29 DIAGNOSIS — L821 Other seborrheic keratosis: Secondary | ICD-10-CM | POA: Diagnosis not present

## 2019-01-29 DIAGNOSIS — D2271 Melanocytic nevi of right lower limb, including hip: Secondary | ICD-10-CM | POA: Diagnosis not present

## 2019-01-29 DIAGNOSIS — D2262 Melanocytic nevi of left upper limb, including shoulder: Secondary | ICD-10-CM | POA: Diagnosis not present

## 2019-01-29 DIAGNOSIS — D225 Melanocytic nevi of trunk: Secondary | ICD-10-CM | POA: Diagnosis not present

## 2019-01-29 DIAGNOSIS — D2261 Melanocytic nevi of right upper limb, including shoulder: Secondary | ICD-10-CM | POA: Diagnosis not present

## 2019-01-30 DIAGNOSIS — C50312 Malignant neoplasm of lower-inner quadrant of left female breast: Secondary | ICD-10-CM | POA: Diagnosis not present

## 2019-02-01 ENCOUNTER — Ambulatory Visit: Payer: PPO | Attending: Internal Medicine

## 2019-02-01 ENCOUNTER — Encounter: Payer: Self-pay | Admitting: Oncology

## 2019-02-01 ENCOUNTER — Encounter: Payer: Self-pay | Admitting: *Deleted

## 2019-02-01 DIAGNOSIS — Z20822 Contact with and (suspected) exposure to covid-19: Secondary | ICD-10-CM

## 2019-02-02 DIAGNOSIS — C50919 Malignant neoplasm of unspecified site of unspecified female breast: Secondary | ICD-10-CM | POA: Diagnosis not present

## 2019-02-02 DIAGNOSIS — Z79899 Other long term (current) drug therapy: Secondary | ICD-10-CM | POA: Diagnosis not present

## 2019-02-02 DIAGNOSIS — I10 Essential (primary) hypertension: Secondary | ICD-10-CM | POA: Diagnosis not present

## 2019-02-02 LAB — NOVEL CORONAVIRUS, NAA: SARS-CoV-2, NAA: DETECTED — AB

## 2019-02-08 ENCOUNTER — Encounter: Payer: Self-pay | Admitting: *Deleted

## 2019-02-21 DIAGNOSIS — H16223 Keratoconjunctivitis sicca, not specified as Sjogren's, bilateral: Secondary | ICD-10-CM | POA: Diagnosis not present

## 2019-03-29 ENCOUNTER — Ambulatory Visit
Admission: RE | Admit: 2019-03-29 | Discharge: 2019-03-29 | Disposition: A | Discharge status: Home / Self Care | Payer: PPO | Source: Ambulatory Visit | Attending: General Surgery | Admitting: General Surgery

## 2019-03-29 DIAGNOSIS — C50212 Malignant neoplasm of upper-inner quadrant of left female breast: Secondary | ICD-10-CM | POA: Insufficient documentation

## 2019-03-29 DIAGNOSIS — Z171 Estrogen receptor negative status [ER-]: Secondary | ICD-10-CM

## 2019-03-29 DIAGNOSIS — N6489 Other specified disorders of breast: Secondary | ICD-10-CM | POA: Diagnosis not present

## 2019-03-29 DIAGNOSIS — R921 Mammographic calcification found on diagnostic imaging of breast: Secondary | ICD-10-CM | POA: Diagnosis not present

## 2019-03-29 HISTORY — DX: Personal history of antineoplastic chemotherapy: Z92.21

## 2019-03-30 NOTE — Progress Notes (Signed)
Patient called with concerns over recent mammogram results, and recommendations.  Notified Dr. Dwyane Luo office.

## 2019-04-02 NOTE — Progress Notes (Signed)
Patient is coming in for follow up, she had a question about the PET scan that she was told she would have done. When to do it?

## 2019-04-03 ENCOUNTER — Other Ambulatory Visit: Payer: Self-pay | Admitting: General Surgery

## 2019-04-03 ENCOUNTER — Inpatient Hospital Stay: Payer: PPO | Attending: Oncology

## 2019-04-03 ENCOUNTER — Inpatient Hospital Stay (HOSPITAL_BASED_OUTPATIENT_CLINIC_OR_DEPARTMENT_OTHER): Payer: PPO | Admitting: Oncology

## 2019-04-03 ENCOUNTER — Encounter: Payer: Self-pay | Admitting: Oncology

## 2019-04-03 VITALS — BP 143/74 | HR 77 | Temp 96.0°F | Resp 18 | Wt 246.3 lb

## 2019-04-03 DIAGNOSIS — C50212 Malignant neoplasm of upper-inner quadrant of left female breast: Secondary | ICD-10-CM | POA: Insufficient documentation

## 2019-04-03 DIAGNOSIS — R928 Other abnormal and inconclusive findings on diagnostic imaging of breast: Secondary | ICD-10-CM | POA: Diagnosis not present

## 2019-04-03 DIAGNOSIS — R7401 Elevation of levels of liver transaminase levels: Secondary | ICD-10-CM

## 2019-04-03 DIAGNOSIS — C50919 Malignant neoplasm of unspecified site of unspecified female breast: Secondary | ICD-10-CM | POA: Diagnosis not present

## 2019-04-03 DIAGNOSIS — Z8616 Personal history of COVID-19: Secondary | ICD-10-CM | POA: Diagnosis not present

## 2019-04-03 DIAGNOSIS — Z171 Estrogen receptor negative status [ER-]: Secondary | ICD-10-CM | POA: Diagnosis not present

## 2019-04-03 DIAGNOSIS — G629 Polyneuropathy, unspecified: Secondary | ICD-10-CM | POA: Diagnosis not present

## 2019-04-03 DIAGNOSIS — C50312 Malignant neoplasm of lower-inner quadrant of left female breast: Secondary | ICD-10-CM | POA: Diagnosis not present

## 2019-04-03 DIAGNOSIS — R599 Enlarged lymph nodes, unspecified: Secondary | ICD-10-CM | POA: Insufficient documentation

## 2019-04-03 DIAGNOSIS — R920 Mammographic microcalcification found on diagnostic imaging of breast: Secondary | ICD-10-CM

## 2019-04-03 DIAGNOSIS — Z17421 Hormone receptor negative with human epidermal growth factor receptor 2 negative status: Secondary | ICD-10-CM

## 2019-04-03 HISTORY — PX: BREAST BIOPSY: SHX20

## 2019-04-03 LAB — CBC WITH DIFFERENTIAL/PLATELET
Abs Immature Granulocytes: 0.01 10*3/uL (ref 0.00–0.07)
Basophils Absolute: 0 10*3/uL (ref 0.0–0.1)
Basophils Relative: 1 %
Eosinophils Absolute: 0.1 10*3/uL (ref 0.0–0.5)
Eosinophils Relative: 3 %
HCT: 42.1 % (ref 36.0–46.0)
Hemoglobin: 14 g/dL (ref 12.0–15.0)
Immature Granulocytes: 0 %
Lymphocytes Relative: 31 %
Lymphs Abs: 1.6 10*3/uL (ref 0.7–4.0)
MCH: 29.2 pg (ref 26.0–34.0)
MCHC: 33.3 g/dL (ref 30.0–36.0)
MCV: 87.9 fL (ref 80.0–100.0)
Monocytes Absolute: 0.4 10*3/uL (ref 0.1–1.0)
Monocytes Relative: 8 %
Neutro Abs: 2.9 10*3/uL (ref 1.7–7.7)
Neutrophils Relative %: 57 %
Platelets: 182 10*3/uL (ref 150–400)
RBC: 4.79 MIL/uL (ref 3.87–5.11)
RDW: 15.2 % (ref 11.5–15.5)
WBC: 5 10*3/uL (ref 4.0–10.5)
nRBC: 0 % (ref 0.0–0.2)

## 2019-04-03 LAB — COMPREHENSIVE METABOLIC PANEL
ALT: 72 U/L — ABNORMAL HIGH (ref 0–44)
AST: 57 U/L — ABNORMAL HIGH (ref 15–41)
Albumin: 4 g/dL (ref 3.5–5.0)
Alkaline Phosphatase: 85 U/L (ref 38–126)
Anion gap: 11 (ref 5–15)
BUN: 17 mg/dL (ref 8–23)
CO2: 28 mmol/L (ref 22–32)
Calcium: 9.3 mg/dL (ref 8.9–10.3)
Chloride: 99 mmol/L (ref 98–111)
Creatinine, Ser: 0.66 mg/dL (ref 0.44–1.00)
GFR calc Af Amer: >60 mL/min (ref >60)
GFR calc non Af Amer: >60 mL/min (ref >60)
Glucose, Bld: 159 mg/dL — ABNORMAL HIGH (ref 70–99)
Potassium: 3.6 mmol/L (ref 3.5–5.1)
Sodium: 138 mmol/L (ref 135–145)
Total Bilirubin: 0.8 mg/dL (ref 0.3–1.2)
Total Protein: 7.1 g/dL (ref 6.5–8.1)

## 2019-04-03 NOTE — Progress Notes (Signed)
Hematology/Oncology progress note [facility] Telephone:([phone] [phone]   Patient Care Team: Idelle Crouch, MD as PCP - General (Internal Medicine) Bary Castilla, Forest Gleason, MD as Consulting Physician (General Surgery) Theodore Demark, RN as Oncology Nurse Navigator Noreene Filbert, MD as Referring Physician (Radiation Oncology) Earlie Server, MD as Consulting Physician (Oncology)  REFERRING PROVIDER: Idelle Crouch, MD REASON FOR VISIT:  Follow up for adjuvant chemotherapy for breast cancer.   HISTORY OF PRESENTING ILLNESS:  Megan Harris is a  69 y.o.  female present for management of  breast cancer  # Stage IB left triple negative breast cancer   03/27/2018 which showed left breast mass. She had diagnostic and ultrasound on 04/07/2018 which showed 0.9cm x 0.9cm x 1.4cm irregular border mixed echotexture mass at the left breast 11 o'clock 7 cm from nipple correlating to the mammographic finding. Ultrasound of the left axilla is negative She underwent biopsy of left breast mass. 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 Estrogen receptor negative, progesterone receptor negative, HER-2 negative.  S/p left lumpectomy and sentinel lymph node biopsy. pT1pN0 05/09/2018 Baseline MUGA showed no focal wall motion abnormality of the left ventricle.  Calculated LVEF 61%  # 05/24/2018- 07/05/2018 Adjuvant Chemotherapy ddAC- # 07/19/2018 started on weekly Taxol 80 mg/m. Starting cycle 4, Taxol was decreased to 53m/m2 # 08/23/2018 Cycle cycle 6, Taxol was decreased to 65 mg/m due to neuropathy. 10/04/2018 patient finished adjuvant chemotherapy with ddAC x 4 followed by 12 weekly Taxol. 12/13/2018, she finished adjuvant radiation.  #Patient was recommended for genetic testing and she declined. Mediport was discontinued per patient's preference   INTERVAL  HISTORY Megan PRELLis a 69y.o. female who has above history reviewed by me presents for follow-up of stage I left triple negative breast cancer 03/29/2019 patient underwent annual diagnostic breast mammogram bilaterally. Mammogram showed new calcifications in the superior lateral right breast spanning 5 mm. Also there is an abnormal lymph node in the low left axilla representing a change from previous mammogram. Patient has appointment to see Dr. BBary Castillafor left axillary lymph node biopsy.  Chronic neuropathy, secondary to chemotherapy.  Patient has tried acupuncture sessions which she feels helpful to her neuropathy symptoms.  Acupuncture sessions were disrupted due to her COVID-19 infection in January.  She had mild symptoms which have all resolved.   Review of Systems  Constitutional: Positive for fatigue. Negative for appetite change, chills, fever and unexpected weight change.  HENT:   Negative for hearing loss and voice change.   Eyes: Negative for eye problems.  Respiratory: Negative for chest tightness and cough.   Cardiovascular: Negative for chest pain.  Gastrointestinal: Negative for abdominal distention, abdominal pain, blood in stool and nausea.  Endocrine: Negative for hot flashes.  Genitourinary: Negative for difficulty urinating and frequency.   Musculoskeletal: Negative for arthralgias.  Skin: Negative for itching and rash.  Neurological: Positive for numbness. Negative for extremity weakness.  Hematological: Negative for adenopathy.  Psychiatric/Behavioral: Negative for confusion. The patient is not nervous/anxious.     MEDICAL HISTORY:  Past Medical History:  Diagnosis Date  . Abdominal pain, left lower quadrant 2012  . Anemia    vitamin d deficiency  . Arthritis   . Asthma   . Back pain 2005   lower back arthritis  . [place]   . [place]   left invasive mammary  . Cancer (  [place]) 04/26/2018   42m, T1c, N0 triple negative  .  Chronic cholecystitis 2012   has had choleycystectomy.  not an issue  . Constipation 2005  . Endocrine problem 2005  . Family history of bone cancer   . Family history of brain cancer   . Family history of colon cancer   . Family history of lung cancer   . GERD (gastroesophageal reflux disease) 2012  . Hyperlipidemia 2012  . Hypertension 2002  . Nausea with vomiting 2012  . Obesity, unspecified 2012  . Personal history of chemotherapy   . Personal history of radiation therapy 2020   invasive mammary  . Pneumonia   . PONV (postoperative nausea and vomiting)   . Special screening for malignant neoplasms, colon 2012    SURGICAL HISTORY: Past Surgical History:  Procedure Laterality Date  . BILATERAL CARPAL TUNNEL RELEASE Bilateral   . BREAST BIOPSY Left 04/13/2018   invasive mamm and DCIS  . BREAST EXCISIONAL BIOPSY Left early 90s   neg  . BREAST LUMPECTOMY Left 04/26/2018   Procedure: BREAST LUMPECTOMY WITH EXCISION OF SENTINEL NODE;  Surgeon: BRobert Bellow MD;  Location: [facility] ORS;  Service: General;  Laterality: Left;  . BUNIONECTOMY Bilateral 2003  . CHOLECYSTECTOMY  06/04/2010  . COLON RESECTION  2004   due diverticulitis   . COLONOSCOPY  2005   [place] Dr. BBary Castilla . COLONOSCOPY  06/28/2012  . COLOSTOMY  2004  . COLOSTOMY REVERSAL  2004  . FOOT SURGERY Bilateral 2012   plantar faciatis  . HAND SURGERY Bilateral    carpal tunnel  . HERNIA REPAIR  2005   at colostomy site after reversal done  . JOINT REPLACEMENT    . KNEE ARTHROPLASTY Right 04/20/2017   Procedure: COMPUTER ASSISTED TOTAL KNEE ARTHROPLASTY;  Surgeon: HDereck Leep MD;  Location: [facility] ORS;  Service: Orthopedics;  Laterality: Right;  . KNEE ARTHROSCOPY Right 02/03/2015   Procedure: ARTHROSCOPY right knee, partial medial menisectomy, condyle malleolus, patella and femoral;  Surgeon: JDereck Leep MD;  Location: [facility] ORS;  Service: Orthopedics;  Laterality: Right;  . NASAL SINUS SURGERY     2005  .  PORTACATH PLACEMENT Right 04/26/2018   Procedure: INSERTION PORT-A-CATH RIGHT;  Surgeon: BRobert Bellow MD;  Location: [facility] ORS;  Service: General;  Laterality: Right;  . SALPINGOOPHORECTOMY  1998  . TONSILLECTOMY    . TUBAL LIGATION  1978  . UPPER GI ENDOSCOPY  06/28/2012  . VAGINAL HYSTERECTOMY  1987    SOCIAL HISTORY: Social History   Socioeconomic History  . Marital status: Married    Spouse name: RFrancee Piccolo . Number of children: 2  . Years of education: Not on file  . Highest education level: Not on file  Occupational History  . [place]ation[place] [employer]  Tobacco Use  . Smoking status: Never Smoker  . Smokeless tobacco: Never Used  Substance and Sexual Activity  . Alcohol use: Not Currently  . Drug use: No  . Sexual activity: Yes    Birth control/protection: Surgical  Other Topics Concern  . Not on file  Social History Narrative  . Not on file   Social Determinants of Health   Financial Resource Strain:   . Difficulty of Paying Living Expenses:   Food Insecurity:   . Worried About RCharity fundraiserin the Last Year:   . RArboriculturistin the Last Year:   Transportation Needs:   .  Lack of Transportation (Medical):   Marland Kitchen Lack of Transportation (Non-Medical):   Physical Activity:   . Days of Exercise per Week:   . Minutes of Exercise per Session:   Stress:   . Feeling of Stress :   Social Connections:   . Frequency of Communication with Friends and Family:   . Frequency of Social Gatherings with Friends and Family:   . Attends Religious Services:   . Active Member of Clubs or Organizations:   . Attends Archivist Meetings:   Marland Kitchen Marital Status:   Intimate Partner Violence:   . Fear of Current or Ex-Partner:   . Emotionally Abused:   Marland Kitchen Physically Abused:   . Sexually Abused:    Lives at home with husband.  FAMILY HISTORY: Family History  Problem Relation Age of Onset  . Heart disease Father   . Diabetes  Paternal Grandmother   . Diabetes Paternal Grandfather   . Brain cancer Mother 11  . Bone cancer Sister 76  . Colon cancer Maternal Uncle        dx 50s-60s  . Throat cancer Cousin   . Leukemia Cousin   . Lung cancer Cousin   . Ovarian cancer Neg Hx   . Breast cancer Neg Hx     ALLERGIES:  is allergic to codeine and penicillins.  MEDICATIONS:  Current Outpatient Medications  Medication Sig Dispense Refill  . amLODipine (NORVASC) 5 MG tablet Take 5 mg by mouth daily.    Marland Kitchen atorvastatin (LIPITOR) 10 MG tablet Take 10 mg by mouth daily.    . Cholecalciferol (VITAMIN D3 MAXIMUM STRENGTH) 5000 units capsule Take 5,000 Units by mouth daily.    Marland Kitchen gabapentin (NEURONTIN) 300 MG capsule TAKE 2 CAPSULES BY MOUTH 3 TIMES DAILY (Patient taking differently: Take 300 mg by mouth 2 (two) times daily. ) 180 capsule 0  . hydrochlorothiazide (HYDRODIURIL) 25 MG tablet Take 25 mg by mouth daily.    . lansoprazole (PREVACID) 30 MG capsule Take 30 mg by mouth 2 (two) times daily.     Marland Kitchen levothyroxine (SYNTHROID, LEVOTHROID) 50 MCG tablet Take 50 mcg by mouth daily before breakfast.     . losartan (COZAAR) 100 MG tablet Take 100 mg by mouth daily.    Marland Kitchen zolpidem (AMBIEN) 10 MG tablet Take 10 mg by mouth at bedtime.     Marland Kitchen acetaminophen (TYLENOL) 500 MG tablet Take 500 mg by mouth every 6 (six) hours as needed.    Marland Kitchen albuterol (PROVENTIL HFA;VENTOLIN HFA) 108 (90 Base) MCG/ACT inhaler Inhale 2 puffs into the lungs every 6 (six) hours as needed for wheezing or shortness of breath.     . lidocaine-prilocaine (EMLA) cream Apply to affected area once (Patient not taking: Reported on 04/02/2019) 30 g 3  . magnesium chloride (SLOW-MAG) 64 MG TBEC SR tablet Take 1 tablet (64 mg total) by mouth 2 (two) times daily. (Patient not taking: Reported on 01/02/2019) 60 tablet 0  . promethazine (PHENERGAN) 25 MG tablet Take 1 tablet (25 mg total) by mouth every 6 (six) hours as needed for nausea or vomiting. (Patient not taking:  Reported on 04/02/2019) 60 tablet 1   No current facility-administered medications for this visit.     PHYSICAL EXAMINATION: ECOG PERFORMANCE STATUS: 1 - Symptomatic but completely ambulatory Vitals:   04/02/19 1522  BP: (!) 143/74  Pulse: 77  Resp: 18  Temp: (!) 96 F (35.6 C)  SpO2: 96%   Filed Weights   04/02/19 1522  Weight:  246 lb 4.8 oz (111.7 kg)    Physical Exam Constitutional:      General: She is not in acute distress.    Appearance: She is obese.  HENT:     Head: Normocephalic and atraumatic.     Mouth/Throat:     Comments: [name] has cleared.  Eyes:     General: No scleral icterus.    Pupils: Pupils are equal, round, and reactive to light.  Cardiovascular:     Rate and Rhythm: Normal rate and regular rhythm.     Heart sounds: Normal heart sounds.  Pulmonary:     Effort: Pulmonary effort is normal. No respiratory distress.     Breath sounds: No wheezing.  Abdominal:     General: Bowel sounds are normal. There is no distension.     Palpations: Abdomen is soft. There is no mass.     Tenderness: There is no abdominal tenderness.  Musculoskeletal:        General: No deformity. Normal range of motion.     Cervical back: Normal range of motion and neck supple.  Skin:    General: Skin is warm and dry.     Findings: No erythema or rash.  Neurological:     Mental Status: She is alert and oriented to person, place, and time. Mental status is at baseline.     Cranial Nerves: No cranial nerve deficit.     Coordination: Coordination normal.  Psychiatric:        Mood and Affect: Mood normal.   Breast exam was performed in seated and lying down position. Patient is status post left lumpectomy with a well-healed surgical scar.  No palpable breast mass bilaterally.  I cannot appreciate left axillary lymphadenopathy.  No palpable right axillary lymphadenopathy.  LABORATORY DATA:  I have reviewed the data as listed Lab Results  Component Value Date   WBC 5.0  04/03/2019   HGB 14.0 04/03/2019   HCT 42.1 04/03/2019   MCV 87.9 04/03/2019   PLT 182 04/03/2019   Recent Labs    10/09/18 1100 10/18/18 1112 11/16/18 1442 01/01/19 1139 04/03/19 0959  NA 136   < > 140 138 138  K 3.8   < > 3.5 3.7 3.6  CL 98   < > 103 102 99  CO2 27   < > _0 GLUCOSE 127*   < > 121* 129* 159*  BUN 13   < > _1 CREATININE 0.94   < > 0.70 0.72 0.66  CALCIUM 9.1   < > 8.9 9.0 9.3  GFRNONAA >60   < > >60 >60 >60  GFRAA >60   < > >60 >60 >60  PROT 6.6  --   --  6.6 7.1  ALBUMIN 3.7  --   --  3.7 4.0  AST 46*  --   --  47* 57*  ALT 54*  --   --  48* 72*  ALKPHOS 53  --   --  82 85  BILITOT 0.9  --   --  0.7 0.8   < > = values in this interval not displayed.   Iron/TIBC/Ferritin/ %Sat No results found for: IRON, TIBC, FERRITIN, IRONPCTSAT   RADIOGRAPHIC STUDIES: I have personally reviewed the radiological images as listed and agreed with the findings in the report. US BREAST LTD UNI LEFT INC AXILLA  Result Date: 03/29/2019 CLINICAL DATA:  Right lumpectomy 1 year ago. EXAM: DIGITAL DIAGNOSTIC BILATERAL MAMMOGRAM WITH CAD AND  TOMO ULTRASOUND LEFT BREAST COMPARISON:  Previous exam(s). ACR Breast Density Category b: There are scattered areas of fibroglandular density. FINDINGS: There are new calcifications in the superolateral right breast spanning 5 mm. There is an abnormal lymph node in the low left axilla, representing a change from previous mammography. Post lumpectomy and radiation changes are seen on the left. Developing fat necrosis is identified. No other suspicious findings. Mammographic images were processed with CAD. On physical exam, no suspicious lumps are identified. Targeted ultrasound is performed, showing an abnormal right axillary lymph node with a cortex measuring 11 mm. IMPRESSION: Abnormal left axillary lymph node. This node could be metastatic or reactive. New indeterminate calcifications in the right breast. RECOMMENDATION: Recommend  stereotactic biopsy of the right breast calcifications and ultrasound-guided biopsy of the abnormal left axillary node. If the left axillary node demonstrates metastatic disease, consider breast MRI. I have discussed the findings and recommendations with the patient. If applicable, a reminder letter will be sent to the patient regarding the next appointment. BI-RADS CATEGORY  4: Suspicious. Electronically Signed   By: Dorise Bullion III M.D   On: 03/29/2019 15:55   MM DIAG BREAST TOMO BILATERAL  Result Date: 03/29/2019 CLINICAL DATA:  Right lumpectomy 1 year ago. EXAM: DIGITAL DIAGNOSTIC BILATERAL MAMMOGRAM WITH CAD AND TOMO ULTRASOUND LEFT BREAST COMPARISON:  Previous exam(s). ACR Breast Density Category b: There are scattered areas of fibroglandular density. FINDINGS: There are new calcifications in the superolateral right breast spanning 5 mm. There is an abnormal lymph node in the low left axilla, representing a change from previous mammography. Post lumpectomy and radiation changes are seen on the left. Developing fat necrosis is identified. No other suspicious findings. Mammographic images were processed with CAD. On physical exam, no suspicious lumps are identified. Targeted ultrasound is performed, showing an abnormal right axillary lymph node with a cortex measuring 11 mm. IMPRESSION: Abnormal left axillary lymph node. This node could be metastatic or reactive. New indeterminate calcifications in the right breast. RECOMMENDATION: Recommend stereotactic biopsy of the right breast calcifications and ultrasound-guided biopsy of the abnormal left axillary node. If the left axillary node demonstrates metastatic disease, consider breast MRI. I have discussed the findings and recommendations with the patient. If applicable, a reminder letter will be sent to the patient regarding the next appointment. BI-RADS CATEGORY  4: Suspicious. Electronically Signed   By: Dorise Bullion III M.D   On: 03/29/2019 15:55    US Abdomen Limited RUQ  Result Date: 01/24/2019 CLINICAL DATA:  Transaminitis EXAM: ULTRASOUND ABDOMEN LIMITED RIGHT UPPER QUADRANT COMPARISON:  None. FINDINGS: Gallbladder: Prior cholecystectomy Common bile duct: Diameter: Normal for patient's age and post cholecystectomy state, 7 mm. Liver: Increased echotexture compatible with fatty infiltration. No focal abnormality or biliary ductal dilatation. Portal vein is patent on color Doppler imaging with normal direction of blood flow towards the liver. Other: None. IMPRESSION: Fatty infiltration of the liver.  No acute findings. Electronically Signed   By: Rolm Baptise M.D.   On: 01/24/2019 09:09      ASSESSMENT & PLAN:  1. 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 ([place])   2. Transaminitis   3. Neuropathy   Cancer Staging 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 [facility]) Staging form: Breast, AJCC 8th Edition - Clinical stage from 04/28/2018: Stage IB (cT1c, cN0, cM0, G3, ER-, PR-, HER2-) - Signed by Earlie Server, MD on 04/29/2018 - Pathologic stage from 05/25/2018: Stage IB (pT1c, pN0(sn), cM0, G3, ER-, PR-, HER2-) - Signed by Earlie Server, MD  on 05/25/2018   # Stage IB left triple negative breast cancer S/p adjuvant chemotherapy.  Status post adjuvant radiation. Recent mammogram findings were independently reviewed and discussed with patient and her daughter Marzetta Board. Left axillary lymph node can be reactive versus local recurrence. Patient has appointment with surgery Dr. Bary Castilla for left axillary lymph node biopsy. If biopsies positive for breast cancer, I will obtain MRI breast. Right breast mammographic calcification, will need biopsy as well. -Mammogram findings described abnormal right axillary lymph node. I  wonder if there is any type of.  Need to clarify with radiology.   #Labs reviewed and discussed with patient.  Patient has stable potassium level. Chronic transaminitis, ultrasound liver in January showed fatty liver disease.  ALT and AST  slightly increased. Depending on breast/axillary lymph node biopsy results, patient may need restaging work-up.  #Pre-existing neuropathy, exacerbated by chemotherapy, continue gabapentin.  Patient has decreased her dose to 300 mg 2 times daily.  Continue acupuncture sessions.  #Follow-up to be determined. All questions were answered. The patient knows to call the clinic with any problems questions or concerns.  Earlie Server, MD, PhD 04/03/2019

## 2019-04-04 ENCOUNTER — Other Ambulatory Visit: Payer: Self-pay | Admitting: General Surgery

## 2019-04-04 DIAGNOSIS — Z9889 Other specified postprocedural states: Secondary | ICD-10-CM

## 2019-04-04 LAB — CANCER ANTIGEN 15-3: CA 15-3: 21.7 U/mL (ref 0.0–25.0)

## 2019-04-04 LAB — SURGICAL PATHOLOGY

## 2019-04-04 LAB — CANCER ANTIGEN 27.29: CA 27.29: 27.3 U/mL (ref 0.0–38.6)

## 2019-04-05 ENCOUNTER — Encounter: Payer: Self-pay | Admitting: Radiology

## 2019-04-10 ENCOUNTER — Ambulatory Visit
Admission: RE | Admit: 2019-04-10 | Discharge: 2019-04-10 | Disposition: A | Discharge status: Home / Self Care | Payer: PPO | Source: Ambulatory Visit | Attending: General Surgery | Admitting: General Surgery

## 2019-04-10 DIAGNOSIS — R920 Mammographic microcalcification found on diagnostic imaging of breast: Secondary | ICD-10-CM

## 2019-04-10 DIAGNOSIS — Z7689 Persons encountering health services in other specified circumstances: Secondary | ICD-10-CM | POA: Diagnosis not present

## 2019-04-10 DIAGNOSIS — R921 Mammographic calcification found on diagnostic imaging of breast: Secondary | ICD-10-CM | POA: Diagnosis not present

## 2019-04-10 HISTORY — PX: BREAST BIOPSY: SHX20

## 2019-04-11 ENCOUNTER — Telehealth: Payer: Self-pay

## 2019-04-11 DIAGNOSIS — C50919 Malignant neoplasm of unspecified site of unspecified female breast: Secondary | ICD-10-CM

## 2019-04-11 LAB — SURGICAL PATHOLOGY

## 2019-04-11 NOTE — Telephone Encounter (Signed)
Done.. Appts has been sched. As requested.. Called pt and left a detailed message on her vmail with appt date and time.

## 2019-04-11 NOTE — Telephone Encounter (Signed)
Last MD visit was TBD.  Staff message received from MD to schedule pt for Follow up in 3 months. Lab md, please order cbc and cmp.

## 2019-05-15 DIAGNOSIS — I10 Essential (primary) hypertension: Secondary | ICD-10-CM | POA: Diagnosis not present

## 2019-05-15 DIAGNOSIS — E559 Vitamin D deficiency, unspecified: Secondary | ICD-10-CM | POA: Diagnosis not present

## 2019-05-15 DIAGNOSIS — Z0000 Encounter for general adult medical examination without abnormal findings: Secondary | ICD-10-CM | POA: Diagnosis not present

## 2019-05-15 DIAGNOSIS — E7800 Pure hypercholesterolemia, unspecified: Secondary | ICD-10-CM | POA: Diagnosis not present

## 2019-05-15 DIAGNOSIS — C50919 Malignant neoplasm of unspecified site of unspecified female breast: Secondary | ICD-10-CM | POA: Diagnosis not present

## 2019-05-15 DIAGNOSIS — R7309 Other abnormal glucose: Secondary | ICD-10-CM | POA: Diagnosis not present

## 2019-05-15 DIAGNOSIS — E039 Hypothyroidism, unspecified: Secondary | ICD-10-CM | POA: Diagnosis not present

## 2019-05-15 DIAGNOSIS — Z79899 Other long term (current) drug therapy: Secondary | ICD-10-CM | POA: Diagnosis not present

## 2019-05-15 DIAGNOSIS — Z6841 Body Mass Index (BMI) 40.0 and over, adult: Secondary | ICD-10-CM | POA: Diagnosis not present

## 2019-05-17 DIAGNOSIS — E7800 Pure hypercholesterolemia, unspecified: Secondary | ICD-10-CM | POA: Diagnosis not present

## 2019-05-17 DIAGNOSIS — Z79899 Other long term (current) drug therapy: Secondary | ICD-10-CM | POA: Diagnosis not present

## 2019-05-17 DIAGNOSIS — I10 Essential (primary) hypertension: Secondary | ICD-10-CM | POA: Diagnosis not present

## 2019-05-17 DIAGNOSIS — R7309 Other abnormal glucose: Secondary | ICD-10-CM | POA: Diagnosis not present

## 2019-06-09 DIAGNOSIS — S00262A Insect bite (nonvenomous) of left eyelid and periocular area, initial encounter: Secondary | ICD-10-CM | POA: Diagnosis not present

## 2019-06-09 DIAGNOSIS — W57XXXA Bitten or stung by nonvenomous insect and other nonvenomous arthropods, initial encounter: Secondary | ICD-10-CM | POA: Diagnosis not present

## 2019-06-09 DIAGNOSIS — L299 Pruritus, unspecified: Secondary | ICD-10-CM | POA: Diagnosis not present

## 2019-06-09 DIAGNOSIS — L089 Local infection of the skin and subcutaneous tissue, unspecified: Secondary | ICD-10-CM | POA: Diagnosis not present

## 2019-06-28 ENCOUNTER — Ambulatory Visit: Payer: PPO | Admitting: Radiation Oncology

## 2019-07-05 ENCOUNTER — Ambulatory Visit: Payer: PPO | Admitting: Radiation Oncology

## 2019-07-12 DIAGNOSIS — C50312 Malignant neoplasm of lower-inner quadrant of left female breast: Secondary | ICD-10-CM | POA: Diagnosis not present

## 2019-07-13 ENCOUNTER — Ambulatory Visit
Admission: RE | Admit: 2019-07-13 | Discharge: 2019-07-13 | Disposition: A | Discharge status: Home / Self Care | Payer: PPO | Source: Ambulatory Visit | Attending: Radiation Oncology | Admitting: Radiation Oncology

## 2019-07-13 ENCOUNTER — Inpatient Hospital Stay: Payer: PPO | Attending: Oncology

## 2019-07-13 ENCOUNTER — Encounter: Payer: Self-pay | Admitting: Oncology

## 2019-07-13 ENCOUNTER — Encounter: Payer: Self-pay | Admitting: Radiation Oncology

## 2019-07-13 ENCOUNTER — Inpatient Hospital Stay (HOSPITAL_BASED_OUTPATIENT_CLINIC_OR_DEPARTMENT_OTHER): Payer: PPO | Admitting: Oncology

## 2019-07-13 ENCOUNTER — Other Ambulatory Visit: Payer: Self-pay

## 2019-07-13 VITALS — BP 147/76 | HR 71 | Temp 96.8°F | Resp 16 | Wt 247.6 lb

## 2019-07-13 DIAGNOSIS — Z801 Family history of malignant neoplasm of trachea, bronchus and lung: Secondary | ICD-10-CM | POA: Diagnosis not present

## 2019-07-13 DIAGNOSIS — Z8249 Family history of ischemic heart disease and other diseases of the circulatory system: Secondary | ICD-10-CM | POA: Diagnosis not present

## 2019-07-13 DIAGNOSIS — C50312 Malignant neoplasm of lower-inner quadrant of left female breast: Secondary | ICD-10-CM | POA: Diagnosis not present

## 2019-07-13 DIAGNOSIS — Z9221 Personal history of antineoplastic chemotherapy: Secondary | ICD-10-CM | POA: Insufficient documentation

## 2019-07-13 DIAGNOSIS — Z800 Family history of malignant neoplasm of digestive organs: Secondary | ICD-10-CM | POA: Diagnosis not present

## 2019-07-13 DIAGNOSIS — E785 Hyperlipidemia, unspecified: Secondary | ICD-10-CM | POA: Insufficient documentation

## 2019-07-13 DIAGNOSIS — G629 Polyneuropathy, unspecified: Secondary | ICD-10-CM | POA: Insufficient documentation

## 2019-07-13 DIAGNOSIS — Z923 Personal history of irradiation: Secondary | ICD-10-CM | POA: Diagnosis not present

## 2019-07-13 DIAGNOSIS — R7401 Elevation of levels of liver transaminase levels: Secondary | ICD-10-CM | POA: Insufficient documentation

## 2019-07-13 DIAGNOSIS — C50919 Malignant neoplasm of unspecified site of unspecified female breast: Secondary | ICD-10-CM

## 2019-07-13 DIAGNOSIS — Z806 Family history of leukemia: Secondary | ICD-10-CM | POA: Insufficient documentation

## 2019-07-13 DIAGNOSIS — Z833 Family history of diabetes mellitus: Secondary | ICD-10-CM | POA: Diagnosis not present

## 2019-07-13 DIAGNOSIS — Z90721 Acquired absence of ovaries, unilateral: Secondary | ICD-10-CM | POA: Diagnosis not present

## 2019-07-13 DIAGNOSIS — G620 Drug-induced polyneuropathy: Secondary | ICD-10-CM | POA: Insufficient documentation

## 2019-07-13 DIAGNOSIS — Z880 Allergy status to penicillin: Secondary | ICD-10-CM | POA: Insufficient documentation

## 2019-07-13 DIAGNOSIS — Z79899 Other long term (current) drug therapy: Secondary | ICD-10-CM | POA: Diagnosis not present

## 2019-07-13 DIAGNOSIS — K760 Fatty (change of) liver, not elsewhere classified: Secondary | ICD-10-CM | POA: Insufficient documentation

## 2019-07-13 DIAGNOSIS — T451X5A Adverse effect of antineoplastic and immunosuppressive drugs, initial encounter: Secondary | ICD-10-CM | POA: Diagnosis not present

## 2019-07-13 DIAGNOSIS — Z885 Allergy status to narcotic agent status: Secondary | ICD-10-CM | POA: Diagnosis not present

## 2019-07-13 DIAGNOSIS — C50212 Malignant neoplasm of upper-inner quadrant of left female breast: Secondary | ICD-10-CM | POA: Diagnosis not present

## 2019-07-13 DIAGNOSIS — Z808 Family history of malignant neoplasm of other organs or systems: Secondary | ICD-10-CM | POA: Diagnosis not present

## 2019-07-13 DIAGNOSIS — Z171 Estrogen receptor negative status [ER-]: Secondary | ICD-10-CM | POA: Insufficient documentation

## 2019-07-13 LAB — COMPREHENSIVE METABOLIC PANEL
ALT: 72 U/L — ABNORMAL HIGH (ref 0–44)
AST: 62 U/L — ABNORMAL HIGH (ref 15–41)
Albumin: 3.9 g/dL (ref 3.5–5.0)
Alkaline Phosphatase: 82 U/L (ref 38–126)
Anion gap: 11 (ref 5–15)
BUN: 15 mg/dL (ref 8–23)
CO2: 28 mmol/L (ref 22–32)
Calcium: 8.9 mg/dL (ref 8.9–10.3)
Chloride: 100 mmol/L (ref 98–111)
Creatinine, Ser: 0.69 mg/dL (ref 0.44–1.00)
GFR calc Af Amer: >60 mL/min (ref >60)
GFR calc non Af Amer: >60 mL/min (ref >60)
Glucose, Bld: 161 mg/dL — ABNORMAL HIGH (ref 70–99)
Potassium: 3.8 mmol/L (ref 3.5–5.1)
Sodium: 139 mmol/L (ref 135–145)
Total Bilirubin: 0.9 mg/dL (ref 0.3–1.2)
Total Protein: 7 g/dL (ref 6.5–8.1)

## 2019-07-13 LAB — CBC WITH DIFFERENTIAL/PLATELET
Abs Immature Granulocytes: 0.03 10*3/uL (ref 0.00–0.07)
Basophils Absolute: 0 10*3/uL (ref 0.0–0.1)
Basophils Relative: 1 %
Eosinophils Absolute: 0.1 10*3/uL (ref 0.0–0.5)
Eosinophils Relative: 2 %
HCT: 40.4 % (ref 36.0–46.0)
Hemoglobin: 13.6 g/dL (ref 12.0–15.0)
Immature Granulocytes: 1 %
Lymphocytes Relative: 28 %
Lymphs Abs: 1.5 10*3/uL (ref 0.7–4.0)
MCH: 29.6 pg (ref 26.0–34.0)
MCHC: 33.7 g/dL (ref 30.0–36.0)
MCV: 88 fL (ref 80.0–100.0)
Monocytes Absolute: 0.4 10*3/uL (ref 0.1–1.0)
Monocytes Relative: 8 %
Neutro Abs: 3.3 10*3/uL (ref 1.7–7.7)
Neutrophils Relative %: 60 %
Platelets: 194 10*3/uL (ref 150–400)
RBC: 4.59 MIL/uL (ref 3.87–5.11)
RDW: 13.3 % (ref 11.5–15.5)
WBC: 5.4 10*3/uL (ref 4.0–10.5)
nRBC: 0 % (ref 0.0–0.2)

## 2019-07-13 NOTE — Progress Notes (Signed)
Radiation Oncology Follow up Note  Name: Megan Harris   Date:   07/13/2019 MRN:  [id number] DOB: 04-24-1950    This 69 y.o. female presents to the clinic today for 57-month follow-up status post whole breast radiation to her left breast for stage I triple negative invasive mammary carcinoma.  REFERRING PROVIDER: Idelle Crouch, MD  HPI: Patient is a 69 year old female now at 6 months having completed whole breast radiation to her left breast for stage I triple negative invasive mammary carcinoma seen today in routine follow-up she is doing well.  She specifically denies breast tenderness cough or bone pain.  She is not on antiestrogen therapy.  She has not yet had follow-up mammograms.    COMPLICATIONS OF TREATMENT: none  FOLLOW UP COMPLIANCE: keeps appointments   PHYSICAL EXAM:  BP (!) 147/76 (BP Location: Left Arm, Patient Position: Sitting)   Pulse 71   Temp (!) 96.8 F (36 C) (Tympanic)   Resp 16   Wt 247 lb 9.6 oz (112.3 kg)   BMI 41.20 kg/m  Lungs are clear to A&P cardiac examination essentially unremarkable with regular rate and rhythm. No dominant mass or nodularity is noted in either breast in 2 positions examined. Incision is well-healed. No axillary or supraclavicular adenopathy is appreciated. Cosmetic result is excellent.  Well-developed well-nourished patient in NAD. HEENT reveals PERLA, EOMI, discs not visualized.  Oral cavity is clear. No oral mucosal lesions are identified. Neck is clear without evidence of cervical or supraclavicular adenopathy. Lungs are clear to A&P. Cardiac examination is essentially unremarkable with regular rate and rhythm without murmur rub or thrill. Abdomen is benign with no organomegaly or masses noted. Motor sensory and DTR levels are equal and symmetric in the upper and lower extremities. Cranial nerves II through XII are grossly intact. Proprioception is intact. No peripheral adenopathy or edema is identified. No motor or sensory levels are  noted. Crude visual fields are within normal range.  RADIOLOGY RESULTS: No current films to review  PLAN: Present time patient is doing well.  I have asked to see her back in 6 months for follow-up.  She will have her surgeon schedule her follow-up mammograms in the near future.  Patient knows to call with any concerns at any time.  I would like to take this opportunity to thank you for allowing me to participate in the care of your patient.Noreene Filbert, MD

## 2019-07-13 NOTE — Progress Notes (Addendum)
Hematology/Oncology progress note [facility] Telephone:(336) [phone] Fax:(336) [phone]   Patient Care Team: Idelle Crouch, MD as PCP - General (Internal Medicine) Bary Castilla, Forest Gleason, MD as Consulting Physician (General Surgery) Theodore Demark, RN as Oncology Nurse Navigator Noreene Filbert, MD as Referring Physician (Radiation Oncology) Earlie Server, MD as Consulting Physician (Oncology)  REFERRING PROVIDER: Idelle Crouch, MD REASON FOR VISIT:  Follow up for adjuvant chemotherapy for breast cancer.   HISTORY OF PRESENTING ILLNESS:  Megan Harris is a  69 y.o.  female present for management of  breast cancer  # Stage IB left triple negative breast cancer   03/27/2018 which showed left breast mass. She had diagnostic and ultrasound on 04/07/2018 which showed 0.9cm x 0.9cm x 1.4cm irregular border mixed echotexture mass at the left breast 11 o'clock 7 cm from nipple correlating to the mammographic finding. Ultrasound of the left axilla is negative She underwent biopsy of left breast mass. 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 Estrogen receptor negative, progesterone receptor negative, HER-2 negative.  S/p left lumpectomy and sentinel lymph node biopsy. pT1pN0 05/09/2018 Baseline MUGA showed no focal wall motion abnormality of the left ventricle.  Calculated LVEF 61%  # 05/24/2018- 07/05/2018 Adjuvant Chemotherapy ddAC- # 07/19/2018 started on weekly Taxol 80 mg/m. Starting cycle 4, Taxol was decreased to 78m/m2 # 08/23/2018 Cycle cycle 6, Taxol was decreased to 65 mg/m due to neuropathy. 10/04/2018 patient finished adjuvant chemotherapy with ddAC x 4 followed by 12 weekly Taxol. 12/13/2018, she finished adjuvant radiation.  #Patient was recommended for genetic testing and she declined. Mediport was discontinued per patient's preference  # 03/29/2019 patient  underwent annual diagnostic breast mammogram bilaterally. Mammogram showed new calcifications in the superior lateral right breast spanning 5 mm. Also there is an abnormal lymph node in the low left axilla representing a change from previous mammogram. 04/03/2019 patient underwent left axillary lymph node biopsy and pathology is negative for malignancy, fibroadipose tissue with necrosis and calcifications, compatible with prior radiation therapy.  Lymph node tissue is not identified. 04/10/2019 breast right upper outer quadrant biopsy showed fibroadenomatoid changes with associated calcifications.  Negative for atypia and malignancy.  INTERVAL HISTORY Megan GILBERTSONis a 69y.o. female who has above history reviewed by me presents for follow-up of stage I left triple negative breast cancer  Chronic neuropathy, secondary to chemotherapy.  Symptoms are stable. Patient has no new complaints today.  Denies any breast concerns or pain   Review of Systems  Constitutional: Negative for appetite change, chills, fatigue, fever and unexpected weight change.  HENT:   Negative for hearing loss and voice change.   Eyes: Negative for eye problems.  Respiratory: Negative for chest tightness and cough.   Cardiovascular: Negative for chest pain.  Gastrointestinal: Negative for abdominal distention, abdominal pain, blood in stool and nausea.  Endocrine: Negative for hot flashes.  Genitourinary: Negative for difficulty urinating and frequency.   Musculoskeletal: Negative for arthralgias.  Skin: Negative for itching and rash.  Neurological: Positive for numbness. Negative for extremity weakness.  Hematological: Negative for adenopathy.  Psychiatric/Behavioral: Negative for confusion. The patient is not nervous/anxious.     MEDICAL HISTORY:  Past Medical History:  Diagnosis Date  . Abdominal pain, left lower quadrant 2012  . Anemia    vitamin d deficiency  . Arthritis   . Asthma   . Back pain 2005    lower back arthritis  .  Bone cancer ([place])   . Breast cancer ([place]) 04/2018   left invasive mammary  . Cancer ([place]) 04/26/2018   23m, T1c, N0 triple negative  . Chronic cholecystitis 2012   has had choleycystectomy.  not an issue  . Constipation 2005  . Endocrine problem 2005  . Family history of bone cancer   . Family history of brain cancer   . Family history of colon cancer   . Family history of lung cancer   . GERD (gastroesophageal reflux disease) 2012  . Hyperlipidemia 2012  . Hypertension 2002  . Nausea with vomiting 2012  . Obesity, unspecified 2012  . Personal history of chemotherapy   . Personal history of radiation therapy 2020   invasive mammary  . Pneumonia   . PONV (postoperative nausea and vomiting)   . Special screening for malignant neoplasms, colon 2012    SURGICAL HISTORY: Past Surgical History:  Procedure Laterality Date  . BILATERAL CARPAL TUNNEL RELEASE Bilateral   . BREAST BIOPSY Left 04/13/2018   invasive mamm and DCIS  . BREAST BIOPSY Right 04/10/2019   calcs bx, ribbon marker, path pending  . BREAST BIOPSY Left 04/03/2019   LN bx with Dr BBary Castilla FEATURES COMPATIBLE WITH PRIOR RADIATION THERAPY  . BREAST EXCISIONAL BIOPSY Left early 90s   neg  . BREAST LUMPECTOMY Left 04/26/2018   Procedure: BREAST LUMPECTOMY WITH EXCISION OF SENTINEL NODE;  Surgeon: BRobert Bellow MD;  Location: [facility] ORS;  Service: General;  Laterality: Left;  . BUNIONECTOMY Bilateral 2003  . CHOLECYSTECTOMY  06/04/2010  . COLON RESECTION  2004   due diverticulitis   . COLONOSCOPY  [place]   [facility]. BBary Castilla . COLONOSCOPY  06/28/2012  . COLOSTOMY  2004  . COLOSTOMY REVERSAL  2004  . FOOT SURGERY Bilateral 2012   plantar faciatis  . HAND SURGERY Bilateral    carpal tunnel  . HERNIA REPAIR  2005   at colostomy site after reversal done  . JOINT REPLACEMENT    . KNEE ARTHROPLASTY Right 04/20/2017   Procedure: COMPUTER ASSISTED TOTAL KNEE ARTHROPLASTY;  Surgeon: HDereck Leep MD;  Location: [facility] ORS;  Service: Orthopedics;  Laterality: Right;  . KNEE ARTHROSCOPY Right 02/03/2015   Procedure: ARTHROSCOPY right knee, partial medial menisectomy, condyle malleolus, patella and femoral;  Surgeon: JDereck Leep MD;  Location: [facility] ORS;  Service: Orthopedics;  Laterality: Right;  . NASAL SINUS SURGERY     2005  . PORTACATH PLACEMENT Right 04/26/2018   Procedure: INSERTION PORT-A-CATH RIGHT;  Surgeon: BRobert Bellow MD;  Location: [facility] ORS;  Service: General;  Laterality: Right;  . SALPINGOOPHORECTOMY  1998  . TONSILLECTOMY    . TUBAL LIGATION  1978  . UPPER GI ENDOSCOPY  06/28/2012  . VAGINAL HYSTERECTOMY  1987    SOCIAL HISTORY: Social History   Socioeconomic History  . Marital status: Married    Spouse name: RFrancee Piccolo . Number of children: 2  . Years of education: Not on file  . Highest education level: Not on file  Occupational History  . Occupation: worked at [employer] [place] retired  Tobacco Use  . Smoking status: Never Smoker  . Smokeless tobacco: Never Used  Vaping Use  . Vaping Use: Never used  Substance and Sexual Activity  . Alcohol use: Not Currently  . Drug use: No  . Sexual activity: Yes    Birth control/protection: Surgical  Other Topics Concern  . Not on file  Social  History Narrative  . Not on file   Social Determinants of Health   Financial Resource Strain:   . Difficulty of Paying Living Expenses:   Food Insecurity:   . Worried About Charity fundraiser in the Last Year:   . Arboriculturist in the Last Year:   Transportation Needs:   . Film/video editor (Medical):   Marland Kitchen Lack of Transportation (Non-Medical):   Physical Activity:   . Days of Exercise per Week:   . Minutes of Exercise per Session:   Stress:   . Feeling of Stress :   Social Connections:   . Frequency of Communication with Friends and Family:   . Frequency of Social Gatherings with Friends and Family:   . Attends Religious Services:     . Active Member of Clubs or Organizations:   . Attends Archivist Meetings:   Marland Kitchen Marital Status:   Intimate Partner Violence:   . Fear of Current or Ex-Partner:   . Emotionally Abused:   Marland Kitchen Physically Abused:   . Sexually Abused:    Lives at home with husband.  FAMILY HISTORY: Family History  Problem Relation Age of Onset  . Heart disease Father   . Diabetes Paternal Grandmother   . Diabetes Paternal Grandfather   . Brain cancer Mother 39  . Bone cancer Sister 69  . Colon cancer Maternal Uncle        dx 50s-60s  . Throat cancer Cousin   . Leukemia Cousin   . Lung cancer Cousin   . Ovarian cancer Neg Hx   . Breast cancer Neg Hx     ALLERGIES:  is allergic to codeine and penicillins.  MEDICATIONS:  Current Outpatient Medications  Medication Sig Dispense Refill  . amLODipine (NORVASC) 5 MG tablet Take 5 mg by mouth daily.    Marland Kitchen atorvastatin (LIPITOR) 10 MG tablet Take 10 mg by mouth daily.    Marland Kitchen gabapentin (NEURONTIN) 300 MG capsule TAKE 2 CAPSULES BY MOUTH 3 TIMES DAILY (Patient taking differently: 2 (two) times daily. ) 180 capsule 0  . hydrochlorothiazide (HYDRODIURIL) 25 MG tablet Take 25 mg by mouth daily.    . lansoprazole (PREVACID) 30 MG capsule Take 30 mg by mouth 2 (two) times daily.     Marland Kitchen levothyroxine (SYNTHROID, LEVOTHROID) 50 MCG tablet Take 50 mcg by mouth daily before breakfast.     . losartan (COZAAR) 100 MG tablet Take 100 mg by mouth daily.    Marland Kitchen zolpidem (AMBIEN) 10 MG tablet Take 10 mg by mouth at bedtime.      No current facility-administered medications for this visit.     PHYSICAL EXAMINATION: ECOG PERFORMANCE STATUS: 1 - Symptomatic but completely ambulatory Vitals:   07/13/19 1007  BP: (!) 147/76  Pulse: 71  Resp: 16  Temp: (!) 96.8 F (36 C)   Filed Weights   07/13/19 1007  Weight: 247 lb 9.6 oz (112.3 kg)    Physical Exam Constitutional:      General: She is not in acute distress.    Appearance: She is obese.  HENT:      Head: Normocephalic and atraumatic.     Mouth/Throat:     Comments: [name] has cleared.  Eyes:     General: No scleral icterus.    Pupils: Pupils are equal, round, and reactive to light.  Cardiovascular:     Rate and Rhythm: Normal rate and regular rhythm.     Heart sounds: Normal heart sounds.  Pulmonary:     Effort: Pulmonary effort is normal. No respiratory distress.     Breath sounds: No wheezing.  Abdominal:     General: Bowel sounds are normal. There is no distension.     Palpations: Abdomen is soft. There is no mass.     Tenderness: There is no abdominal tenderness.  Musculoskeletal:        General: No deformity. Normal range of motion.     Cervical back: Normal range of motion and neck supple.  Skin:    General: Skin is warm and dry.     Findings: No erythema or rash.  Neurological:     Mental Status: She is alert and oriented to person, place, and time. Mental status is at baseline.     Cranial Nerves: No cranial nerve deficit.     Coordination: Coordination normal.  Psychiatric:        Mood and Affect: Mood normal.   Patient just had breast exam by Dr. Baruch Gouty  LABORATORY DATA:  I have reviewed the data as listed Lab Results  Component Value Date   WBC 5.4 07/13/2019   HGB 13.6 07/13/2019   HCT 40.4 07/13/2019   MCV 88.0 07/13/2019   PLT 194 07/13/2019   Recent Labs    01/01/19 1139 04/03/19 0959 07/13/19 0921  NA 138 138 139  K 3.7 3.6 3.8  CL 102 99 100  CO2 _0 GLUCOSE 129* 159* 161*  BUN _1 CREATININE 0.72 0.66 0.69  CALCIUM 9.0 9.3 8.9  GFRNONAA >60 >60 >60  GFRAA >60 >60 >60  PROT 6.6 7.1 7.0  ALBUMIN 3.7 4.0 3.9  AST 47* 57* 62*  ALT 48* 72* 72*  ALKPHOS 82 85 82  BILITOT 0.7 0.8 0.9   Iron/TIBC/Ferritin/ %Sat No results found for: IRON, TIBC, FERRITIN, IRONPCTSAT   RADIOGRAPHIC STUDIES: I have personally reviewed the radiological images as listed and agreed with the findings in the report. No results  found.    ASSESSMENT & PLAN:  1. 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 ([place])   2. Transaminitis   3. Neuropathy   Cancer Staging 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 [facility]) Staging form: Breast, AJCC 8th Edition - Clinical stage from 04/28/2018: Stage IB (cT1c, cN0, cM0, G3, ER-, PR-, HER2-) - Signed by Earlie Server, MD on 04/29/2018 - Pathologic stage from 05/25/2018: Stage IB (pT1c, pN0(sn), cM0, G3, ER-, PR-, HER2-) - Signed by Earlie Server, MD on 05/25/2018   # Stage IB left triple negative breast cancer S/p adjuvant chemotherapy.  Status post adjuvant radiation. Labs are reviewed and discussed with patient. No evidence of remission.  She is doing well clinically.  Chronic transaminitis, ultrasound liver in January showed fatty liver disease.  ALT and AST are trending up. Patient had ultrasound right upper quadrant which showed fatty liver disease. I discussed with patient about gastroenterology evaluation and she agrees.  Will refer to Dr. Alice Reichert for further evaluation.   #Pre-existing neuropathy, exacerbated by chemotherapy, continue gabapentin.  Patient has decreased her dose to 300 mg 2 times daily.    #Follow-up  4 months. . All questions were answered. The patient knows to call the clinic with any problems questions or concerns.  Earlie Server, MD, PhD 07/13/2019

## 2019-07-13 NOTE — Progress Notes (Signed)
Patient denied any problems and concerns.

## 2019-08-25 DIAGNOSIS — Z20828 Contact with and (suspected) exposure to other viral communicable diseases: Secondary | ICD-10-CM | POA: Diagnosis not present

## 2019-08-28 DIAGNOSIS — Z03818 Encounter for observation for suspected exposure to other biological agents ruled out: Secondary | ICD-10-CM | POA: Diagnosis not present

## 2019-09-12 DIAGNOSIS — E7800 Pure hypercholesterolemia, unspecified: Secondary | ICD-10-CM | POA: Diagnosis not present

## 2019-09-12 DIAGNOSIS — R7309 Other abnormal glucose: Secondary | ICD-10-CM | POA: Diagnosis not present

## 2019-09-12 DIAGNOSIS — K760 Fatty (change of) liver, not elsewhere classified: Secondary | ICD-10-CM | POA: Diagnosis not present

## 2019-09-12 DIAGNOSIS — Z20828 Contact with and (suspected) exposure to other viral communicable diseases: Secondary | ICD-10-CM | POA: Diagnosis not present

## 2019-09-12 DIAGNOSIS — R7401 Elevation of levels of liver transaminase levels: Secondary | ICD-10-CM | POA: Diagnosis not present

## 2019-09-12 DIAGNOSIS — C50919 Malignant neoplasm of unspecified site of unspecified female breast: Secondary | ICD-10-CM | POA: Diagnosis not present

## 2019-09-12 DIAGNOSIS — E559 Vitamin D deficiency, unspecified: Secondary | ICD-10-CM | POA: Diagnosis not present

## 2019-09-12 DIAGNOSIS — Z6841 Body Mass Index (BMI) 40.0 and over, adult: Secondary | ICD-10-CM | POA: Diagnosis not present

## 2019-09-12 DIAGNOSIS — E039 Hypothyroidism, unspecified: Secondary | ICD-10-CM | POA: Diagnosis not present

## 2019-09-12 DIAGNOSIS — Z79899 Other long term (current) drug therapy: Secondary | ICD-10-CM | POA: Diagnosis not present

## 2019-09-12 DIAGNOSIS — I10 Essential (primary) hypertension: Secondary | ICD-10-CM | POA: Diagnosis not present

## 2019-09-14 DIAGNOSIS — M8588 Other specified disorders of bone density and structure, other site: Secondary | ICD-10-CM | POA: Diagnosis not present

## 2019-10-16 DIAGNOSIS — R1030 Lower abdominal pain, unspecified: Secondary | ICD-10-CM | POA: Diagnosis not present

## 2019-10-16 DIAGNOSIS — R350 Frequency of micturition: Secondary | ICD-10-CM | POA: Diagnosis not present

## 2019-10-17 ENCOUNTER — Other Ambulatory Visit: Payer: Self-pay | Admitting: General Surgery

## 2019-10-17 DIAGNOSIS — C50212 Malignant neoplasm of upper-inner quadrant of left female breast: Secondary | ICD-10-CM

## 2019-10-17 DIAGNOSIS — Z171 Estrogen receptor negative status [ER-]: Secondary | ICD-10-CM

## 2019-10-17 DIAGNOSIS — R1032 Left lower quadrant pain: Secondary | ICD-10-CM

## 2019-10-22 ENCOUNTER — Other Ambulatory Visit
Admission: RE | Admit: 2019-10-22 | Discharge: 2019-10-22 | Disposition: A | Discharge status: Home / Self Care | Payer: PPO | Source: Ambulatory Visit | Attending: General Surgery | Admitting: General Surgery

## 2019-10-22 DIAGNOSIS — R1032 Left lower quadrant pain: Secondary | ICD-10-CM

## 2019-10-22 LAB — CBC WITH DIFFERENTIAL/PLATELET
Abs Immature Granulocytes: 0.01 10*3/uL (ref 0.00–0.07)
Basophils Absolute: 0 10*3/uL (ref 0.0–0.1)
Basophils Relative: 1 %
Eosinophils Absolute: 0.1 10*3/uL (ref 0.0–0.5)
Eosinophils Relative: 2 %
HCT: 40.1 % (ref 36.0–46.0)
Hemoglobin: 13.6 g/dL (ref 12.0–15.0)
Immature Granulocytes: 0 %
Lymphocytes Relative: 25 %
Lymphs Abs: 1.5 10*3/uL (ref 0.7–4.0)
MCH: 30.2 pg (ref 26.0–34.0)
MCHC: 33.9 g/dL (ref 30.0–36.0)
MCV: 89.1 fL (ref 80.0–100.0)
Monocytes Absolute: 0.4 10*3/uL (ref 0.1–1.0)
Monocytes Relative: 6 %
Neutro Abs: 4 10*3/uL (ref 1.7–7.7)
Neutrophils Relative %: 66 %
Platelets: 168 10*3/uL (ref 150–400)
RBC: 4.5 MIL/uL (ref 3.87–5.11)
RDW: 13.1 % (ref 11.5–15.5)
WBC: 6 10*3/uL (ref 4.0–10.5)
nRBC: 0 % (ref 0.0–0.2)

## 2019-10-22 LAB — BASIC METABOLIC PANEL
Anion gap: 9 (ref 5–15)
BUN: 13 mg/dL (ref 8–23)
CO2: 30 mmol/L (ref 22–32)
Calcium: 9.4 mg/dL (ref 8.9–10.3)
Chloride: 99 mmol/L (ref 98–111)
Creatinine, Ser: 0.73 mg/dL (ref 0.44–1.00)
GFR calc Af Amer: >60 mL/min (ref >60)
GFR calc non Af Amer: >60 mL/min (ref >60)
Glucose, Bld: 156 mg/dL — ABNORMAL HIGH (ref 70–99)
Potassium: 3.5 mmol/L (ref 3.5–5.1)
Sodium: 138 mmol/L (ref 135–145)

## 2019-10-23 ENCOUNTER — Ambulatory Visit
Admission: RE | Admit: 2019-10-23 | Discharge: 2019-10-23 | Disposition: A | Discharge status: Home / Self Care | Payer: PPO | Source: Ambulatory Visit | Attending: General Surgery | Admitting: General Surgery

## 2019-10-23 ENCOUNTER — Other Ambulatory Visit: Payer: Self-pay

## 2019-10-23 DIAGNOSIS — R1032 Left lower quadrant pain: Secondary | ICD-10-CM | POA: Diagnosis not present

## 2019-10-23 DIAGNOSIS — K760 Fatty (change of) liver, not elsewhere classified: Secondary | ICD-10-CM | POA: Diagnosis not present

## 2019-10-23 MED ORDER — IOHEXOL 300 MG/ML  SOLN
100.0000 mL | Freq: Once | INTRAMUSCULAR | Status: AC | PRN
  Administered 2019-10-23: 100 mL via INTRAVENOUS

## 2019-10-24 ENCOUNTER — Other Ambulatory Visit: Payer: Self-pay | Admitting: General Surgery

## 2019-10-24 DIAGNOSIS — R300 Dysuria: Secondary | ICD-10-CM

## 2019-10-24 NOTE — Progress Notes (Signed)
Subjective:     Patient ID: Megan Harris is a 69 y.o. female.  HPI  The following portions of the patient's history were reviewed and updated as appropriate.  This an established patient is here today for: office visit. Here for evaluation of lower abdominal pain. She states her bowels move daily or every other day. Denies any bleeding or pain. She admits to possible "mucous or oil" looking a film on the stools since her abdominal discomfort began. She also admits to urinary frequency and feeling pressure even after she voids. Occasional urgency and incontinence.  She is about 16 years out from her episode of sigmoid diverticulitis that required a diverting colostomy.  She has had a longstanding asymptomatic hernia at the stoma site.  Present since 2014.  Review of Systems  Constitutional: Negative for chills and fever.  Respiratory: Negative for cough.   Gastrointestinal: Positive for abdominal pain. Negative for constipation, diarrhea, nausea and vomiting.  Genitourinary: Positive for frequency and urgency.       Chief Complaint  Patient presents with  . Abdominal Pain     BP 154/88   Pulse 81   Temp 36.6 C (97.8 F)   Ht 165.1 cm (5\' 5" )   Wt (!) 111.1 kg (245 lb)   SpO2 94%   BMI 40.77 kg/m       Past Medical History:  Diagnosis Date  . Abnormal LFTs    history of  . Asthma without status asthmaticus, unspecified   . Benign essential HTN   . Breast cancer (CMS-HCC) 2020   left   . Chronic insomnia   . COPD (chronic obstructive pulmonary disease) (CMS-HCC)   . Degenerative disc disease, cervical   . Diverticulitis 2005  . History of GI diverticular bleed    status post partial colectomy  . History of peptic ulcer disease   . History of pneumonia   . Hyperlipidemia   . Type 2 diabetes mellitus (CMS-HCC)   . Vitamin D deficiency           Past Surgical History:  Procedure Laterality Date  . CHOLECYSTECTOMY    . COLONOSCOPY   2014  . COLOSTOMY  2005   reversal within 5 months   . history of hernia repair    . hysterectomy/bilateral oophorectomy    . MASTECTOMY PARTIAL / LUMPECTOMY Left 2020   lumpectomy  . Right knee arthroscopy, partial medial meniscectomy, and chondroplasy  02/03/2015   Dr Marry Guan  . Right total knee arthroplasty using computer-assisted navigation  04/20/2017   Dr Marry Guan  . status post partial colectomy  2005              OB History    Gravida  2   Para  2   Term      Preterm      AB      Living        SAB      TAB      Ectopic      Molar      Multiple      Live Births          Obstetric Comments  Age at first period 55 Age of first pregnancy 47 Age menopause 26 hysterectomy        Social History          Socioeconomic History  . Marital status: Married    Spouse name: Francee Piccolo  . Number of children: 4  . Years of education:  12  . Highest education level: High school graduate  Occupational History  . Occupation: Retired  Tobacco Use  . Smoking status: Never Smoker  . Smokeless tobacco: Never Used  Vaping Use  . Vaping Use: Never used  Substance and Sexual Activity  . Alcohol use: No    Alcohol/week: 0.0 standard drinks    Comment: rarely  . Drug use: No  . Sexual activity: Yes    Partners: Male  Other Topics Concern  . Not on file  Social History Narrative  . Not on file   Social Determinants of Health   Financial Resource Strain:   . Difficulty of Paying Living Expenses:   Food Insecurity:   . Worried About Charity fundraiser in the Last Year:   . Arboriculturist in the Last Year:   Transportation Needs:   . Film/video editor (Medical):   Marland Kitchen Lack of Transportation (Non-Medical):             Allergies  Allergen Reactions  . Adhesive Tape-Silicones Dermatitis    Skin irritation  . Codeine Other (See Comments)    Heavy sleepiness- unable to wake up  . Penicillin Rash    Current  Medications        Current Outpatient Medications  Medication Sig Dispense Refill  . amLODIPine (NORVASC) 5 MG tablet TAKE ONE TABLET BY MOUTH EVERY DAY 90 tablet 3  . atorvastatin (LIPITOR) 10 MG tablet TAKE ONE TABLET EVERY DAY 90 tablet 1  . cholecalciferol (VITAMIN D3) 5,000 unit capsule Take 5,000 Units by mouth once daily       . gabapentin (NEURONTIN) 300 MG capsule TAKE 1 CAPSULE BY MOUTH 3 TIMES DAILY 270 capsule 3  . hydroCHLOROthiazide (HYDRODIURIL) 25 MG tablet TAKE ONE TABLET EVERY DAY 90 tablet 1  . lansoprazole (PREVACID) 30 MG DR capsule TAKE 1 CAPSULE TWICE DAILY 180 capsule 3  . levothyroxine (SYNTHROID) 50 MCG tablet TAKE 1 TABLET EVERY DAY ON EMPTY STOMACHWITH A GLASS OF WATER AT LEAST 30-60 MINBEFORE BREAKFAST 30 tablet 11  . loratadine (CLARITIN) 10 mg capsule Take 10 mg by mouth once daily    . losartan (COZAAR) 100 MG tablet TAKE ONE TABLET EVERY DAY 90 tablet 1  . potassium 99 mg Tab Take by mouth    . zolpidem (AMBIEN) 10 mg tablet TAKE ONE TABLET AT BEDTIME 90 tablet 1   No current facility-administered medications for this visit.      Family History  Problem Relation Age of Onset  . Other Mother        brain tumor  . Myocardial Infarction (Heart attack) Father   . Bone cancer Sister   . Colon cancer Maternal Uncle         Objective:   Physical Exam Exam conducted with a chaperone present.  Constitutional:      Appearance: Normal appearance.  Cardiovascular:     Rate and Rhythm: Normal rate and regular rhythm.     Pulses: Normal pulses.     Heart sounds: Normal heart sounds.  Pulmonary:     Effort: Pulmonary effort is normal.     Breath sounds: Normal breath sounds.  Abdominal:     General: Bowel sounds are normal.     Palpations: Abdomen is soft.     Tenderness: There is abdominal tenderness in the suprapubic area.     Hernia: A hernia is present.    Musculoskeletal:     Cervical back: Neck supple.  Skin:  General:  Skin is warm and dry.  Neurological:     Mental Status: She is alert and oriented to person, place, and time.  Psychiatric:        Mood and Affect: Mood normal.        Behavior: Behavior normal.    Labs and Radiology:  Urinalysis completed yesterday was essentially negative.     Assessment:     Symptoms of dysuria and pelvic pressure, potential for UTI most likely.  No evidence of acute abdomen.    Plan:     Based on the results of her upcoming urine culture and urinalysis, will decide whether CT and laboratory studies are appropriate.     Entered by Karie Fetch, RN, acting as a scribe for Dr. Hervey Ard, MD.  The documentation recorded by the scribe accurately reflects the service I personally performed and the decisions made by me.   Robert Bellow, MD FACS   CT of October 5 reviewed. Poor distension or intraluminal lesion in the right colon on my review of the films. 2014 colonoscopy was normal..  For repeat exam to assess imaging issues.

## 2019-11-06 DIAGNOSIS — S80861A Insect bite (nonvenomous), right lower leg, initial encounter: Secondary | ICD-10-CM | POA: Diagnosis not present

## 2019-11-06 NOTE — Progress Notes (Signed)
11/07/2019 11:08 AM   Megan Harris [birth year] [id number]  Referring provider: Idelle Crouch, MD [facility] [place],  [place] [address] Chief Complaint  Patient presents with  . Dysuria    New Patient    HPI: Megan Harris is a 69 y.o. female who presents today for evaluation and management of bladder symptoms.  Patient saw Dr. Bary Castilla on 10/16/2019. She has had some lower abdominal pain, pelvic pressure and urinary frequency. She had occasional urgency and incontinence   She reports urgency, frequency, leakage, abdominal and lower pelvic pain. Onset of symptoms started 6 weeks ago and becoming progressively worse. Denies dysuria. She has lower back pain.   Patient states vaginal prolapse. She feels pressure in her vaginal area while sitting. She does not have a OB/GYN.   She has alternating diarrhea like stool and constipation.   She has a cyst on her kidney.   PVR 4 mL.   She has a personal history of stage 1B left triple negative breast cancer, managed by Dr. Tasia Catchings.   She denies dysuria or UTIs.   PMH: Past Medical History:  Diagnosis Date  . Abdominal pain, left lower quadrant 2012  . Anemia    vitamin d deficiency  . Arthritis   . Asthma   . Back pain 2005   lower back arthritis  . Bone cancer ([place])   . Breast cancer ([place]) 04/2018   left invasive mammary  . Cancer ([place]) 04/26/2018   71mm, T1c, N0 triple negative  . Chronic cholecystitis 2012   has had choleycystectomy.  not an issue  . Constipation 2005  . Endocrine problem 2005  . Family history of bone cancer   . Family history of brain cancer   . Family history of colon cancer   . Family history of lung cancer   . GERD (gastroesophageal reflux disease) 2012  . Hyperlipidemia 2012  . Hypertension 2002  . Nausea with vomiting 2012  . Obesity, unspecified 2012  . Personal history of chemotherapy   . Personal history of radiation therapy 2020   invasive mammary  .  Pneumonia   . PONV (postoperative nausea and vomiting)   . Special screening for malignant neoplasms, colon 2012    Surgical History: Past Surgical History:  Procedure Laterality Date  . BILATERAL CARPAL TUNNEL RELEASE Bilateral   . BREAST BIOPSY Left 04/13/2018   invasive mamm and DCIS  . BREAST BIOPSY Right 04/10/2019   calcs bx, ribbon marker, path pending  . BREAST BIOPSY Left 04/03/2019   LN bx with Dr Bary Castilla, FEATURES COMPATIBLE WITH PRIOR RADIATION THERAPY  . BREAST EXCISIONAL BIOPSY Left early 90s   neg  . BREAST LUMPECTOMY Left 04/26/2018   Procedure: BREAST LUMPECTOMY WITH EXCISION OF SENTINEL NODE;  Surgeon: Robert Bellow, MD;  Location: [facility] ORS;  Service: General;  Laterality: Left;  . BUNIONECTOMY Bilateral 2003  . CHOLECYSTECTOMY  06/04/2010  . COLON RESECTION  2004   due diverticulitis   . COLONOSCOPY  2005   [place], Dr. Bary Castilla  . COLONOSCOPY  06/28/2012  . COLOSTOMY  2004  . COLOSTOMY REVERSAL  2004  . FOOT SURGERY Bilateral 2012   plantar faciatis  . HAND SURGERY Bilateral    carpal tunnel  . HERNIA REPAIR  2005   at colostomy site after reversal done  . JOINT REPLACEMENT    . KNEE ARTHROPLASTY Right 04/20/2017   Procedure: COMPUTER ASSISTED TOTAL KNEE ARTHROPLASTY;  Surgeon: Dereck Leep,  MD;  Location: [facility] ORS;  Service: Orthopedics;  Laterality: Right;  . KNEE ARTHROSCOPY Right 02/03/2015   Procedure: ARTHROSCOPY right knee, partial medial menisectomy, condyle malleolus, patella and femoral;  Surgeon: Dereck Leep, MD;  Location: [facility] ORS;  Service: Orthopedics;  Laterality: Right;  . NASAL SINUS SURGERY     2005  . PORTACATH PLACEMENT Right 04/26/2018   Procedure: INSERTION PORT-A-CATH RIGHT;  Surgeon: Robert Bellow, MD;  Location: [facility] ORS;  Service: General;  Laterality: Right;  . SALPINGOOPHORECTOMY  1998  . TONSILLECTOMY    . TUBAL LIGATION  1978  . UPPER GI ENDOSCOPY  06/28/2012  . VAGINAL HYSTERECTOMY  1987    Home Medications:    Allergies as of 11/07/2019      Reactions   Codeine Other (See Comments)   Goes to sleep and unable to wake up.   Penicillins Hives   Did it involve swelling of the face/tongue/throat, SOB, or low BP? No Did it involve sudden or severe rash/hives, skin peeling, or any reaction on the inside of your mouth or nose? No Did you need to seek medical attention at a hospital or doctor's office? Yes When did it last happen?50s If all above answers are "NO", may proceed with cephalosporin use.      Medication List       Accurate as of November 07, 2019 11:08 AM. If you have any questions, ask your nurse or doctor.        amLODipine 5 MG tablet Commonly known as: NORVASC Take 5 mg by mouth daily.   atorvastatin 10 MG tablet Commonly known as: LIPITOR Take 10 mg by mouth daily.   gabapentin 300 MG capsule Commonly known as: NEURONTIN TAKE 2 CAPSULES BY MOUTH 3 TIMES DAILY What changed: See the new instructions.   hydrochlorothiazide 25 MG tablet Commonly known as: HYDRODIURIL Take 25 mg by mouth daily.   lansoprazole 30 MG capsule Commonly known as: PREVACID Take 30 mg by mouth 2 (two) times daily.   levothyroxine 50 MCG tablet Commonly known as: SYNTHROID Take 50 mcg by mouth daily before breakfast.   losartan 100 MG tablet Commonly known as: COZAAR Take 100 mg by mouth daily.   zolpidem 10 MG tablet Commonly known as: AMBIEN Take 10 mg by mouth at bedtime.       Allergies:  Allergies  Allergen Reactions  . Codeine Other (See Comments)    Goes to sleep and unable to wake up.  Marland Kitchen Penicillins Hives    Did it involve swelling of the face/tongue/throat, SOB, or low BP? No Did it involve sudden or severe rash/hives, skin peeling, or any reaction on the inside of your mouth or nose? No Did you need to seek medical attention at a hospital or doctor's office? Yes When did it last happen?50s If all above answers are "NO", may proceed with cephalosporin use.      Family History: Family History  Problem Relation Age of Onset  . Heart disease Father   . Diabetes Paternal Grandmother   . Diabetes Paternal Grandfather   . Brain cancer Mother 85  . Bone cancer Sister 62  . Colon cancer Maternal Uncle        dx 50s-60s  . Throat cancer Cousin   . Leukemia Cousin   . Lung cancer Cousin   . Ovarian cancer Neg Hx   . Breast cancer Neg Hx   . Prostate cancer Neg Hx   . Kidney cancer Neg Hx   .  Bladder Cancer Neg Hx     Social History:  reports that she has never smoked. She has never used smokeless tobacco. She reports previous alcohol use. She reports that she does not use drugs.   Physical Exam: BP 131/86   Pulse 94   Ht 5\' 5"  (1.651 m)   Wt 243 lb (110.2 kg)   BMI 40.44 kg/m   Constitutional:  Alert and oriented, No acute distress. HEENT: [place] AT, moist mucus membranes.  Trachea midline, no masses. Cardiovascular: No clubbing, cyanosis, or edema. Respiratory: Normal respiratory effort, no increased work of breathing. Skin: No rashes, bruises or suspicious lesions. Neurologic: Grossly intact, no focal deficits, moving all 4 extremities. Psychiatric: Normal mood and affect.  Laboratory Data:  Lab Results  Component Value Date   CREATININE 0.73 10/22/2019      Pertinent Imaging: Results for orders placed or performed in visit on 11/07/19  BLADDER SCAN AMB NON-IMAGING  Result Value Ref Range   Scan Result 50ml      Assessment & Plan:   Patient understood and agreed.   1. Urinary urgency/frequency/urge incontinence  Given abrupt onset symptoms it is important to rule out underlying conditions. Recommend a cystoscopy and urine cytology.  PVR 4 mL. Will give sample of Myrbetriq 25 mg to improve symptoms.  -Given 4 Myrbetriq 25 mg daily, # 28 samples; I have advised the patient of the side effects of Myrbetriq, such as: elevation in BP, urinary retention and/or HA   2. Lower abdominal/pelvic pain Unclear if the is related  to bladder and abdominal process.   3. Rectocele Based on symptoms I recommend a referral to OB/GYN for consideration of symptomatic rectocele repair Will wait until confirmed on exam Will perform pelvic exam during cystoscopy.     [facility] [address], [place], [place] [address] [phone]  I, Selena Batten, am acting as a scribe for Dr. Hollice Espy.  I have reviewed the above documentation for accuracy and completeness, and I agree with the above.   Hollice Espy, MD

## 2019-11-07 ENCOUNTER — Other Ambulatory Visit
Admission: RE | Admit: 2019-11-07 | Discharge: 2019-11-07 | Disposition: A | Discharge status: Home / Self Care | Payer: PPO | Source: Ambulatory Visit | Attending: General Surgery | Admitting: General Surgery

## 2019-11-07 ENCOUNTER — Other Ambulatory Visit: Payer: Self-pay

## 2019-11-07 ENCOUNTER — Encounter: Payer: Self-pay | Admitting: Urology

## 2019-11-07 ENCOUNTER — Ambulatory Visit (INDEPENDENT_AMBULATORY_CARE_PROVIDER_SITE_OTHER): Payer: PPO | Admitting: Urology

## 2019-11-07 VITALS — BP 131/86 | HR 94 | Ht 65.0 in | Wt 243.0 lb

## 2019-11-07 DIAGNOSIS — Z01812 Encounter for preprocedural laboratory examination: Secondary | ICD-10-CM | POA: Diagnosis not present

## 2019-11-07 DIAGNOSIS — R300 Dysuria: Secondary | ICD-10-CM

## 2019-11-07 DIAGNOSIS — Z20822 Contact with and (suspected) exposure to covid-19: Secondary | ICD-10-CM | POA: Insufficient documentation

## 2019-11-07 DIAGNOSIS — R350 Frequency of micturition: Secondary | ICD-10-CM

## 2019-11-07 DIAGNOSIS — N816 Rectocele: Secondary | ICD-10-CM

## 2019-11-07 LAB — BLADDER SCAN AMB NON-IMAGING

## 2019-11-07 NOTE — Patient Instructions (Signed)
Cystoscopy Cystoscopy is a procedure that is used to help diagnose and sometimes treat conditions that affect the lower urinary tract. The lower urinary tract includes the bladder and the urethra. The urethra is the tube that drains urine from the bladder. Cystoscopy is done using a thin, tube-shaped instrument with a light and camera at the end (cystoscope). The cystoscope may be hard or flexible, depending on the goal of the procedure. The cystoscope is inserted through the urethra, into the bladder. Cystoscopy may be recommended if you have:  Urinary tract infections that keep coming back.  Blood in the urine (hematuria).  An inability to control when you urinate (urinary incontinence) or an overactive bladder.  Unusual cells found in a urine sample.  A blockage in the urethra, such as a urinary stone.  Painful urination.  An abnormality in the bladder found during an intravenous pyelogram (IVP) or CT scan. Cystoscopy may also be done to remove a sample of tissue to be examined under a microscope (biopsy). What are the risks? Generally, this is a safe procedure. However, problems may occur, including:  Infection.  Bleeding.  What happens during the procedure?  1. You will be given one or more of the following: ? A medicine to numb the area (local anesthetic). 2. The area around the opening of your urethra will be cleaned. 3. The cystoscope will be passed through your urethra into your bladder. 4. Germ-free (sterile) fluid will flow through the cystoscope to fill your bladder. The fluid will stretch your bladder so that your health care provider can clearly examine your bladder walls. 5. Your doctor will look at the urethra and bladder. 6. The cystoscope will be removed The procedure may vary among health care providers  What can I expect after the procedure? After the procedure, it is common to have: 1. Some soreness or pain in your abdomen and urethra. 2. Urinary symptoms.  These include: ? Mild pain or burning when you urinate. Pain should stop within a few minutes after you urinate. This may last for up to 1 week. ? A small amount of blood in your urine for several days. ? Feeling like you need to urinate but producing only a small amount of urine. Follow these instructions at home: General instructions  Return to your normal activities as told by your health care provider.   Do not drive for 24 hours if you were given a sedative during your procedure.  Watch for any blood in your urine. If the amount of blood in your urine increases, call your health care provider.  If a tissue sample was removed for testing (biopsy) during your procedure, it is up to you to get your test results. Ask your health care provider, or the department that is doing the test, when your results will be ready.  Drink enough fluid to keep your urine pale yellow.  Keep all follow-up visits as told by your health care provider. This is important. Contact a health care provider if you:  Have pain that gets worse or does not get better with medicine, especially pain when you urinate.  Have trouble urinating.  Have more blood in your urine. Get help right away if you:  Have blood clots in your urine.  Have abdominal pain.  Have a fever or chills.  Are unable to urinate. Summary  Cystoscopy is a procedure that is used to help diagnose and sometimes treat conditions that affect the lower urinary tract.  Cystoscopy is done using   a thin, tube-shaped instrument with a light and camera at the end.  After the procedure, it is common to have some soreness or pain in your abdomen and urethra.  Watch for any blood in your urine. If the amount of blood in your urine increases, call your health care provider.  If you were prescribed an antibiotic medicine, take it as told by your health care provider. Do not stop taking the antibiotic even if you start to feel better. This  information is not intended to replace advice given to you by your health care provider. Make sure you discuss any questions you have with your health care provider. Document Revised: 12/27/2017 Document Reviewed: 12/27/2017 Elsevier Patient Education  2020 Elsevier Inc.   

## 2019-11-08 ENCOUNTER — Encounter: Payer: Self-pay | Admitting: General Surgery

## 2019-11-08 LAB — MICROSCOPIC EXAMINATION: Epithelial Cells (non renal): >10 /hpf — AB (ref 0–10)

## 2019-11-08 LAB — URINALYSIS, COMPLETE
Bilirubin, UA: NEGATIVE
Glucose, UA: NEGATIVE
Leukocytes,UA: NEGATIVE
Nitrite, UA: NEGATIVE
Protein,UA: NEGATIVE
RBC, UA: NEGATIVE
Specific Gravity, UA: >1.03 — ABNORMAL HIGH (ref 1.005–1.030)
Urobilinogen, Ur: 0.2 mg/dL (ref 0.2–1.0)
pH, UA: 5 (ref 5.0–7.5)

## 2019-11-08 LAB — SARS CORONAVIRUS 2 (TAT 6-24 HRS): SARS Coronavirus 2: NEGATIVE

## 2019-11-09 ENCOUNTER — Encounter: Payer: Self-pay | Admitting: General Surgery

## 2019-11-09 ENCOUNTER — Encounter
Admission: RE | Disposition: A | Discharge status: Home / Self Care | Payer: Self-pay | Source: Home / Self Care | Attending: General Surgery

## 2019-11-09 ENCOUNTER — Ambulatory Visit: Payer: PPO | Admitting: Anesthesiology

## 2019-11-09 ENCOUNTER — Ambulatory Visit
Admission: RE | Admit: 2019-11-09 | Discharge: 2019-11-09 | Disposition: A | Discharge status: Home / Self Care | Payer: PPO | Attending: General Surgery | Admitting: General Surgery

## 2019-11-09 DIAGNOSIS — K635 Polyp of colon: Secondary | ICD-10-CM | POA: Diagnosis not present

## 2019-11-09 DIAGNOSIS — Z885 Allergy status to narcotic agent status: Secondary | ICD-10-CM | POA: Insufficient documentation

## 2019-11-09 DIAGNOSIS — K5730 Diverticulosis of large intestine without perforation or abscess without bleeding: Secondary | ICD-10-CM | POA: Insufficient documentation

## 2019-11-09 DIAGNOSIS — D124 Benign neoplasm of descending colon: Secondary | ICD-10-CM | POA: Insufficient documentation

## 2019-11-09 DIAGNOSIS — D122 Benign neoplasm of ascending colon: Secondary | ICD-10-CM | POA: Insufficient documentation

## 2019-11-09 DIAGNOSIS — Z800 Family history of malignant neoplasm of digestive organs: Secondary | ICD-10-CM | POA: Insufficient documentation

## 2019-11-09 DIAGNOSIS — Z1211 Encounter for screening for malignant neoplasm of colon: Secondary | ICD-10-CM | POA: Diagnosis not present

## 2019-11-09 DIAGNOSIS — E119 Type 2 diabetes mellitus without complications: Secondary | ICD-10-CM | POA: Diagnosis not present

## 2019-11-09 DIAGNOSIS — Z9049 Acquired absence of other specified parts of digestive tract: Secondary | ICD-10-CM | POA: Diagnosis not present

## 2019-11-09 DIAGNOSIS — I10 Essential (primary) hypertension: Secondary | ICD-10-CM | POA: Diagnosis not present

## 2019-11-09 DIAGNOSIS — Z880 Allergy status to penicillin: Secondary | ICD-10-CM | POA: Insufficient documentation

## 2019-11-09 DIAGNOSIS — K219 Gastro-esophageal reflux disease without esophagitis: Secondary | ICD-10-CM | POA: Diagnosis not present

## 2019-11-09 DIAGNOSIS — J449 Chronic obstructive pulmonary disease, unspecified: Secondary | ICD-10-CM | POA: Diagnosis not present

## 2019-11-09 HISTORY — DX: Personal history of other diseases of the digestive system: Z87.19

## 2019-11-09 HISTORY — PX: COLONOSCOPY WITH PROPOFOL: SHX5780

## 2019-11-09 HISTORY — DX: Vitamin D deficiency, unspecified: E55.9

## 2019-11-09 HISTORY — DX: Other cervical disc degeneration, unspecified cervical region: M50.30

## 2019-11-09 HISTORY — DX: Personal history of peptic ulcer disease: Z87.11

## 2019-11-09 HISTORY — DX: Psychophysiologic insomnia: F51.04

## 2019-11-09 HISTORY — DX: Abnormal results of liver function studies: R94.5

## 2019-11-09 HISTORY — DX: Chronic obstructive pulmonary disease, unspecified: J44.9

## 2019-11-09 HISTORY — DX: Personal history of pneumonia (recurrent): Z87.01

## 2019-11-09 HISTORY — DX: Diverticulitis of intestine, part unspecified, without perforation or abscess without bleeding: K57.92

## 2019-11-09 HISTORY — DX: Other specified abnormal findings of blood chemistry: R79.89

## 2019-11-09 SURGERY — COLONOSCOPY WITH PROPOFOL
Anesthesia: General

## 2019-11-09 MED ORDER — PROPOFOL 10 MG/ML IV BOLUS
INTRAVENOUS | Status: AC
  Filled 2019-11-09: qty 20

## 2019-11-09 MED ORDER — PROPOFOL 10 MG/ML IV BOLUS
INTRAVENOUS | Status: DC | PRN
  Administered 2019-11-09: 100 mg via INTRAVENOUS

## 2019-11-09 MED ORDER — PHENYLEPHRINE HCL (PRESSORS) 10 MG/ML IV SOLN
INTRAVENOUS | Status: DC | PRN
  Administered 2019-11-09 (×4): 100 ug via INTRAVENOUS

## 2019-11-09 MED ORDER — SODIUM CHLORIDE 0.9 % IV SOLN
INTRAVENOUS | Status: DC
  Administered 2019-11-09: 1000 mL via INTRAVENOUS

## 2019-11-09 MED ORDER — LIDOCAINE HCL (PF) 2 % IJ SOLN
INTRAMUSCULAR | Status: AC
  Filled 2019-11-09: qty 5

## 2019-11-09 MED ORDER — PROPOFOL 500 MG/50ML IV EMUL
INTRAVENOUS | Status: DC | PRN
  Administered 2019-11-09: 170 ug/kg/min via INTRAVENOUS

## 2019-11-09 MED ORDER — LIDOCAINE 2% (20 MG/ML) 5 ML SYRINGE
INTRAMUSCULAR | Status: DC | PRN
  Administered 2019-11-09: 100 mg via INTRAVENOUS

## 2019-11-09 NOTE — Anesthesia Preprocedure Evaluation (Signed)
Anesthesia Evaluation  Patient identified by MRN, date of birth, ID band Patient awake    Reviewed: Allergy & Precautions, NPO status , Patient's Chart, lab work & pertinent test results  History of Anesthesia Complications (+) PONV and history of anesthetic complications  Airway Mallampati: II  TM Distance: >3 FB Neck ROM: Full    Dental no notable dental hx.    Pulmonary asthma , COPD,    breath sounds clear to auscultation- rhonchi (-) wheezing      Cardiovascular hypertension, Pt. on medications (-) CAD, (-) Past MI, (-) Cardiac Stents and (-) CABG  Rhythm:Regular Rate:Normal - Systolic murmurs and - Diastolic murmurs    Neuro/Psych neg Seizures negative neurological ROS  negative psych ROS   GI/Hepatic Neg liver ROS, GERD  ,  Endo/Other  diabetes  Renal/GU negative Renal ROS     Musculoskeletal  (+) Arthritis ,   Abdominal (+) + obese,   Peds  Hematology  (+) anemia ,   Anesthesia Other Findings Past Medical History: 2012: Abdominal pain, left lower quadrant No date: Abnormal LFTs No date: Anemia     Comment:  vitamin d deficiency No date: Arthritis No date: Asthma 2005: Back pain     Comment:  lower back arthritis No date: Bone cancer ([place]) 04/2018: Breast cancer ([place])     Comment:  left invasive mammary 04/26/2018: Cancer ([place])     Comment:  34mm, T1c, N0 triple negative 2012: Chronic cholecystitis     Comment:  has had choleycystectomy.  not an issue No date: Chronic insomnia 2005: Constipation No date: COPD (chronic obstructive pulmonary disease) ([facility]) No date: Degenerative disc disease, cervical No date: Diabetes mellitus without complication ([facility]) No date: Diverticulitis 2005: Endocrine problem No date: Family history of bone cancer No date: Family history of brain cancer No date: Family history of colon cancer No date: Family history of lung cancer 2012: GERD (gastroesophageal reflux  disease) No date: History of GI diverticular bleed No date: History of peptic ulcer disease No date: History of pneumonia 2012: Hyperlipidemia 2002: Hypertension 2012: Nausea with vomiting 2012: Obesity, unspecified No date: Personal history of chemotherapy 2020: Personal history of radiation therapy     Comment:  invasive mammary No date: Pneumonia No date: PONV (postoperative nausea and vomiting) 2012: Special screening for malignant neoplasms, colon No date: Vitamin D deficiency   Reproductive/Obstetrics                             Anesthesia Physical Anesthesia Plan  ASA: III  Anesthesia Plan: General   Post-op Pain Management:    Induction: Intravenous  PONV Risk Score and Plan: 3 and Propofol infusion  Airway Management Planned: Natural Airway  Additional Equipment:   Intra-op Plan:   Post-operative Plan:   Informed Consent: I have reviewed the patients History and Physical, chart, labs and discussed the procedure including the risks, benefits and alternatives for the proposed anesthesia with the patient or authorized representative who has indicated his/her understanding and acceptance.     Dental advisory given  Plan Discussed with: CRNA and Anesthesiologist  Anesthesia Plan Comments:         Anesthesia Quick Evaluation

## 2019-11-09 NOTE — H&P (Signed)
Megan Harris [id number] 1950-01-21     HPI:  Patient with a past history of sigmoid diverticulitis requiring sigmoid colectomy about 16 years ago.  Recent history of low pelvic discomfort. CT showed bladder wall thickening.  November 07, 2019 evaluation by urology reviewed. Patient remains afebrile.  Dulcolax caused nausea and vomiting the day after ingestion. Tolerated Miralax well.  Medications Prior to Admission  Medication Sig Dispense Refill Last Dose  . amLODipine (NORVASC) 5 MG tablet Take 5 mg by mouth daily.   11/08/2019 at Unknown time  . atorvastatin (LIPITOR) 10 MG tablet Take 10 mg by mouth daily.   11/08/2019 at Unknown time  . Cholecalciferol 125 MCG (5000 UT) capsule Take 1,000 Units by mouth daily.   11/08/2019 at Unknown time  . gabapentin (NEURONTIN) 300 MG capsule TAKE 2 CAPSULES BY MOUTH 3 TIMES DAILY (Patient taking differently: 2 (two) times daily. ) 180 capsule 0 11/08/2019 at Unknown time  . hydrochlorothiazide (HYDRODIURIL) 25 MG tablet Take 25 mg by mouth daily.   11/08/2019 at Unknown time  . lansoprazole (PREVACID) 30 MG capsule Take 30 mg by mouth 2 (two) times daily.    11/09/2019 at 0700  . levothyroxine (SYNTHROID, LEVOTHROID) 50 MCG tablet Take 50 mcg by mouth daily before breakfast.    11/08/2019 at Unknown time  . loratadine (CLARITIN) 10 MG tablet Take 10 mg by mouth daily.   11/08/2019 at Unknown time  . losartan (COZAAR) 100 MG tablet Take 100 mg by mouth daily.   11/09/2019 at 0700  . Potassium 99 MG TABS Take by mouth daily.   11/08/2019 at Unknown time  . zolpidem (AMBIEN) 10 MG tablet Take 10 mg by mouth at bedtime.    11/08/2019 at Unknown time   Allergies  Allergen Reactions  . Codeine Other (See Comments)    Goes to sleep and unable to wake up.  Marland Kitchen Penicillins Hives    Did it involve swelling of the face/tongue/throat, SOB, or low BP? No Did it involve sudden or severe rash/hives, skin peeling, or any reaction on the inside of your mouth or  nose? No Did you need to seek medical attention at a hospital or doctor's office? Yes When did it last happen?50s If all above answers are "NO", may proceed with cephalosporin use.   . Adhesive [Tape] Dermatitis    Skin irritation   Past Medical History:  Diagnosis Date  . Abdominal pain, left lower quadrant 2012  . Abnormal LFTs   . Anemia    vitamin d deficiency  . Arthritis   . Asthma   . Back pain 2005   lower back arthritis  . Bone cancer ([place])   . Breast cancer ([place]) 04/2018   left invasive mammary  . Cancer ([place]) 04/26/2018   57mm, T1c, N0 triple negative  . Chronic cholecystitis 2012   has had choleycystectomy.  not an issue  . Chronic insomnia   . Constipation 2005  . COPD (chronic obstructive pulmonary disease) ([place])   . Degenerative disc disease, cervical   . Diabetes mellitus without complication ([place])   . Diverticulitis   . Endocrine problem 2005  . Family history of bone cancer   . Family history of brain cancer   . Family history of colon cancer   . Family history of lung cancer   . GERD (gastroesophageal reflux disease) 2012  . History of GI diverticular bleed   . History of peptic ulcer disease   . History of pneumonia   .  Hyperlipidemia 2012  . Hypertension 2002  . Nausea with vomiting 2012  . Obesity, unspecified 2012  . Personal history of chemotherapy   . Personal history of radiation therapy 2020   invasive mammary  . Pneumonia   . PONV (postoperative nausea and vomiting)   . Special screening for malignant neoplasms, colon 2012  . Vitamin D deficiency    Past Surgical History:  Procedure Laterality Date  . BILATERAL CARPAL TUNNEL RELEASE Bilateral   . BREAST BIOPSY Left 04/13/2018   invasive mamm and DCIS  . BREAST BIOPSY Right 04/10/2019   calcs bx, ribbon marker, path pending  . BREAST BIOPSY Left 04/03/2019   LN bx with Dr Bary Castilla, FEATURES COMPATIBLE WITH PRIOR RADIATION THERAPY  . BREAST EXCISIONAL BIOPSY Left early 90s    neg  . BREAST LUMPECTOMY Left 04/26/2018   Procedure: BREAST LUMPECTOMY WITH EXCISION OF SENTINEL NODE;  Surgeon: Robert Bellow, MD;  Location: [facility] ORS;  Service: General;  Laterality: Left;  . BUNIONECTOMY Bilateral 2003  . CHOLECYSTECTOMY  06/04/2010  . COLON RESECTION  2004   due diverticulitis   . COLON SURGERY    . COLONOSCOPY  2005   [place], Dr. Bary Castilla  . COLONOSCOPY  06/28/2012  . COLOSTOMY  2004  . COLOSTOMY REVERSAL  2004  . FOOT SURGERY Bilateral 2012   plantar faciatis  . HAND SURGERY Bilateral    carpal tunnel  . HERNIA REPAIR  2005   at colostomy site after reversal done  . JOINT REPLACEMENT    . KNEE ARTHROPLASTY Right 04/20/2017   Procedure: COMPUTER ASSISTED TOTAL KNEE ARTHROPLASTY;  Surgeon: Dereck Leep, MD;  Location: [facility] ORS;  Service: Orthopedics;  Laterality: Right;  . KNEE ARTHROSCOPY Right 02/03/2015   Procedure: ARTHROSCOPY right knee, partial medial menisectomy, condyle malleolus, patella and femoral;  Surgeon: Dereck Leep, MD;  Location: [facility] ORS;  Service: Orthopedics;  Laterality: Right;  . MASTECTOMY Left    partial lumpectomy'  . NASAL SINUS SURGERY     2005  . PORTACATH PLACEMENT Right 04/26/2018   Procedure: INSERTION PORT-A-CATH RIGHT;  Surgeon: Robert Bellow, MD;  Location: [facility] ORS;  Service: General;  Laterality: Right;  . SALPINGOOPHORECTOMY  1998  . status post colectomy    . TONSILLECTOMY    . TUBAL LIGATION  1978  . UPPER GI ENDOSCOPY  06/28/2012  . VAGINAL HYSTERECTOMY  1987   Social History   Socioeconomic History  . Marital status: Married    Spouse name: Francee Piccolo  . Number of children: 2  . Years of education: Not on file  . Highest education level: Not on file  Occupational History  . Occupation: worked at [employer] in [place]: retired  Tobacco Use  . Smoking status: Never Smoker  . Smokeless tobacco: Never Used  Vaping Use  . Vaping Use: Never used  Substance and Sexual Activity  . Alcohol use: Not  Currently  . Drug use: No  . Sexual activity: Yes    Birth control/protection: Surgical  Other Topics Concern  . Not on file  Social History Narrative  . Not on file   Social Determinants of Health   Financial Resource Strain:   . Difficulty of Paying Living Expenses: Not on file  Food Insecurity:   . Worried About Charity fundraiser in the Last Year: Not on file  . Ran Out of Food in the Last Year: Not on file  Transportation Needs:   . Lack  of Transportation (Medical): Not on file  . Lack of Transportation (Non-Medical): Not on file  Physical Activity:   . Days of Exercise per Week: Not on file  . Minutes of Exercise per Session: Not on file  Stress:   . Feeling of Stress : Not on file  Social Connections:   . Frequency of Communication with Friends and Family: Not on file  . Frequency of Social Gatherings with Friends and Family: Not on file  . Attends Religious Services: Not on file  . Active Member of Clubs or Organizations: Not on file  . Attends Archivist Meetings: Not on file  . Marital Status: Not on file  Intimate Partner Violence:   . Fear of Current or Ex-Partner: Not on file  . Emotionally Abused: Not on file  . Physically Abused: Not on file  . Sexually Abused: Not on file   Social History   Social History Narrative  . Not on file     ROS: Negative.     PE: HEENT: Negative. Lungs: Clear. Cardio: RR.  Assessment/Plan:  Proceed with planned endoscopy.   Forest Gleason [facility] 11/09/2019

## 2019-11-09 NOTE — Op Note (Signed)
[facility] Patient Name: Megan Harris Procedure Date: 11/09/2019 9:20 AM MRN: [id number] Account #: [id number] Date of Birth: [birth year] Admit Type: Outpatient Age: 69 Room: [facility] ENDO ROOM 1 Gender: Female Note Status: Finalized Procedure:             Colonoscopy Indications:           Screening for colorectal malignant neoplasm Providers:             Robert Bellow, MD Medicines:             Monitored Anesthesia Care Complications:         No immediate complications. Procedure:             Pre-Anesthesia Assessment:                        - Prior to the procedure, a History and Physical was                         performed, and patient medications, allergies and                         sensitivities were reviewed. The patient's tolerance                         of previous anesthesia was reviewed.                        - The risks and benefits of the procedure and the                         sedation options and risks were discussed with the                         patient. All questions were answered and informed                         consent was obtained.                        After obtaining informed consent, the colonoscope was                         passed under direct vision. Throughout the procedure,                         the patient's blood pressure, pulse, and oxygen                         saturations were monitored continuously. The                         Colonoscope was introduced through the anus and                         advanced to the the cecum, identified by appendiceal                         orifice and ileocecal valve. The colonoscopy was  performed without difficulty. The patient tolerated                         the procedure well. The quality of the bowel                         preparation was excellent. Findings:      A few medium-mouthed diverticula were found in the recto-sigmoid  colon.      Two sessile polyps were found in the descending colon and ascending       colon. The polyps were 5 to 8 mm in size. These polyps were removed with       a cold biopsy forceps. Resection and retrieval were complete.      The retroflexed view of the distal rectum and anal verge was normal and       showed no anal or rectal abnormalities.      No inflammation was seen in rectum to account for the patient's lower       abdominal discomfort. Impression:            - Diverticulosis in the recto-sigmoid colon.                        - Two 5 to 8 mm polyps in the descending colon and in                         the ascending colon, removed with a cold biopsy                         forceps. Resected and retrieved.                        - The distal rectum and anal verge are normal on                         retroflexion view. Recommendation:        - Telephone endoscopist for pathology results in 1                         week. Procedure Code(s):     --- Professional ---                        [phone], Colonoscopy, flexible; with biopsy, single or                         multiple Diagnosis Code(s):     --- Professional ---                        Z12.11, Encounter for screening for malignant neoplasm                         of colon                        K63.5, Polyp of colon                        K57.30, Diverticulosis of large intestine without  perforation or abscess without bleeding CPT copyright 2019 [employer]. All rights reserved. The codes documented in this report are preliminary and upon coder review may  be revised to meet current compliance requirements. Robert Bellow, MD 11/09/2019 10:04:32 AM This report has been signed electronically. Number of Addenda: 0 Note Initiated On: 11/09/2019 9:20 AM Scope Withdrawal Time: 0 hours 14 minutes 6 seconds  Total Procedure Duration: 0 hours 16 minutes 29 seconds       [facility]

## 2019-11-09 NOTE — Transfer of Care (Signed)
Immediate Anesthesia Transfer of Care Note  Patient: Megan Harris  Procedure(s) Performed: COLONOSCOPY WITH PROPOFOL (N/A )  Patient Location: Endoscopy Unit  Anesthesia Type:General  Level of Consciousness: awake  Airway & Oxygen Therapy: Patient connected to nasal cannula oxygen  Post-op Assessment: Post -op Vital signs reviewed and stable  Post vital signs: stable  Last Vitals:  Vitals Value Taken Time  BP 96/48 11/09/19 1003  Temp    Pulse 64 11/09/19 1004  Resp 9 11/09/19 1004  SpO2 97 % 11/09/19 1004  Vitals shown include unvalidated device data.  Last Pain:  Vitals:   11/09/19 1002  TempSrc: (P) Temporal  PainSc:          Complications: No complications documented.

## 2019-11-09 NOTE — Anesthesia Postprocedure Evaluation (Signed)
Anesthesia Post Note  Patient: Megan Harris  Procedure(s) Performed: COLONOSCOPY WITH PROPOFOL (N/A )  Patient location during evaluation: Endoscopy Anesthesia Type: General Level of consciousness: awake and alert and oriented Pain management: pain level controlled Vital Signs Assessment: post-procedure vital signs reviewed and stable Respiratory status: spontaneous breathing, nonlabored ventilation and respiratory function stable Cardiovascular status: blood pressure returned to baseline and stable Postop Assessment: no signs of nausea or vomiting Anesthetic complications: no   No complications documented.   Last Vitals:  Vitals:   11/09/19 0918 11/09/19 1002  BP: (!) 140/104 (!) 96/48  Pulse: 82   Resp: 18   Temp: (!) 36.1 C (!) 35.8 C  SpO2: 99%     Last Pain:  Vitals:   11/09/19 1032  TempSrc:   PainSc: 0-No pain                 Json Koelzer

## 2019-11-12 ENCOUNTER — Inpatient Hospital Stay: Payer: PPO | Attending: Oncology | Admitting: Oncology

## 2019-11-12 ENCOUNTER — Inpatient Hospital Stay: Payer: PPO

## 2019-11-12 ENCOUNTER — Other Ambulatory Visit: Payer: Self-pay

## 2019-11-12 ENCOUNTER — Encounter: Payer: Self-pay | Admitting: Oncology

## 2019-11-12 VITALS — BP 129/86 | HR 73 | Temp 98.1°F | Resp 18 | Wt 245.0 lb

## 2019-11-12 DIAGNOSIS — Z171 Estrogen receptor negative status [ER-]: Secondary | ICD-10-CM | POA: Insufficient documentation

## 2019-11-12 DIAGNOSIS — Z23 Encounter for immunization: Secondary | ICD-10-CM | POA: Diagnosis not present

## 2019-11-12 DIAGNOSIS — C50919 Malignant neoplasm of unspecified site of unspecified female breast: Secondary | ICD-10-CM

## 2019-11-12 DIAGNOSIS — R7401 Elevation of levels of liver transaminase levels: Secondary | ICD-10-CM | POA: Insufficient documentation

## 2019-11-12 DIAGNOSIS — N9489 Other specified conditions associated with female genital organs and menstrual cycle: Secondary | ICD-10-CM

## 2019-11-12 DIAGNOSIS — N858 Other specified noninflammatory disorders of uterus: Secondary | ICD-10-CM | POA: Insufficient documentation

## 2019-11-12 DIAGNOSIS — G629 Polyneuropathy, unspecified: Secondary | ICD-10-CM | POA: Insufficient documentation

## 2019-11-12 DIAGNOSIS — C50212 Malignant neoplasm of upper-inner quadrant of left female breast: Secondary | ICD-10-CM | POA: Diagnosis not present

## 2019-11-12 LAB — COMPREHENSIVE METABOLIC PANEL
ALT: 57 U/L — ABNORMAL HIGH (ref 0–44)
AST: 40 U/L (ref 15–41)
Albumin: 3.9 g/dL (ref 3.5–5.0)
Alkaline Phosphatase: 83 U/L (ref 38–126)
Anion gap: 12 (ref 5–15)
BUN: 18 mg/dL (ref 8–23)
CO2: 26 mmol/L (ref 22–32)
Calcium: 8.7 mg/dL — ABNORMAL LOW (ref 8.9–10.3)
Chloride: 98 mmol/L (ref 98–111)
Creatinine, Ser: 0.74 mg/dL (ref 0.44–1.00)
GFR, Estimated: >60 mL/min (ref >60)
Glucose, Bld: 169 mg/dL — ABNORMAL HIGH (ref 70–99)
Potassium: 3.2 mmol/L — ABNORMAL LOW (ref 3.5–5.1)
Sodium: 136 mmol/L (ref 135–145)
Total Bilirubin: 0.9 mg/dL (ref 0.3–1.2)
Total Protein: 7 g/dL (ref 6.5–8.1)

## 2019-11-12 LAB — CBC WITH DIFFERENTIAL/PLATELET
Abs Immature Granulocytes: 0.01 10*3/uL (ref 0.00–0.07)
Basophils Absolute: 0 10*3/uL (ref 0.0–0.1)
Basophils Relative: 0 %
Eosinophils Absolute: 0.1 10*3/uL (ref 0.0–0.5)
Eosinophils Relative: 2 %
HCT: 39.7 % (ref 36.0–46.0)
Hemoglobin: 13.3 g/dL (ref 12.0–15.0)
Immature Granulocytes: 0 %
Lymphocytes Relative: 28 %
Lymphs Abs: 1.5 10*3/uL (ref 0.7–4.0)
MCH: 29.4 pg (ref 26.0–34.0)
MCHC: 33.5 g/dL (ref 30.0–36.0)
MCV: 87.8 fL (ref 80.0–100.0)
Monocytes Absolute: 0.4 10*3/uL (ref 0.1–1.0)
Monocytes Relative: 7 %
Neutro Abs: 3.3 10*3/uL (ref 1.7–7.7)
Neutrophils Relative %: 63 %
Platelets: 164 10*3/uL (ref 150–400)
RBC: 4.52 MIL/uL (ref 3.87–5.11)
RDW: 13.1 % (ref 11.5–15.5)
WBC: 5.3 10*3/uL (ref 4.0–10.5)
nRBC: 0 % (ref 0.0–0.2)

## 2019-11-12 LAB — SURGICAL PATHOLOGY

## 2019-11-12 MED ORDER — INFLUENZA VAC A&B SA ADJ QUAD 0.5 ML IM PRSY
0.5000 mL | PREFILLED_SYRINGE | Freq: Once | INTRAMUSCULAR | Status: AC
  Administered 2019-11-12: 0.5 mL via INTRAMUSCULAR
  Filled 2019-11-12: qty 0.5

## 2019-11-12 NOTE — Progress Notes (Signed)
   11/13/2019  CC:  Chief Complaint  Patient presents with  . Cysto    HPI: Megan Harris is a 69 y.o. female who presents today for a cystoscopy.   Patient saw Dr. Bary Castilla on 10/16/2019. She has had some lower abdominal pain, pelvic pressure and urinary frequency. She had occasional urgency and incontinence   Last time she reported urgency, frequency, leakage, abdominal and lower pelvic pain. Onset of symptoms started 6 weeks ago and becoming progressively worse. Denied dysuria. She had lower back pain. PVR was 4 mL.   Patient stated vaginal prolapse. She felt pressure in her vaginal area while sitting. She did not have a OB/GYN.   She had alternating diarrhea like stool and constipation.   She had a cyst on her kidney.   The patient reports that Myrbetriq 25 mg did improve urinary symptoms.  She has a personal history of stage 1B left triple negative breast cancer, managed by Dr. Tasia Catchings.   Blood pressure (!) 153/95, pulse 89. NED. A&Ox3.   No respiratory distress   Abd soft, NT, ND Normal external genitalia with patent urethral meatus Stage 1 cystocele, good vaginal support, no uterus.   She was given Bactrim x1 today at the time of the procedure based on urinalysis and appearance of bladder  Cystoscopy Procedure Note  Patient identification was confirmed, informed consent was obtained, and patient was prepped using Betadine solution.  Lidocaine jelly was administered per urethral meatus.    Procedure: - Flexible cystoscope introduced, without any difficulty.   - Thorough search of the bladder revealed:    no stones    no ulcers     no tumors    no urethral polyps    no trabeculation  -There was diffuse erythema consistent with cystitis, can not rule out CIS.   - Ureteral orifices were normal in position and appearance.  Post-Procedure: - Patient tolerated the procedure well  Assessment/ Plan:  1. Urinary urgency/frequency/urge incontinence UA today was  suspicious for infection vs. contaminated  Diffuse patchy bladder erythema, question infection, cannot rule out CIS although much lower on differential diagnosis Continue Myrbetriq 25 mg daily.  May consider bladder biopsy if urine culture negative and/or cytology is positive -Urine culture and cytology sent   2. Lower abdominal/pelvic pain Unclear if the is related to bladder and abdominal process.    3. Rectocele  Patient was examined in a supine position. Rectocele was not appreciated on exam today. Will discuss further in follow up.    Follow up in 2 weeks for symptoms recheck, UA/PVR.    Fransico Him, am acting as a scribe for Dr. Hollice Espy.  I have reviewed the above documentation for accuracy and completeness, and I agree with the above.   Hollice Espy, MD

## 2019-11-12 NOTE — Progress Notes (Signed)
Patient is being evaluated by urology for urinary symptoms.

## 2019-11-12 NOTE — Progress Notes (Signed)
Hematology/Oncology progress note [facility] Telephone:(336) [phone] Fax:(336) [phone]   Patient Care Team: Idelle Crouch, MD as PCP - General (Internal Medicine) Bary Castilla, Forest Gleason, MD as Consulting Physician (General Surgery) Theodore Demark, RN as Oncology Nurse Navigator Noreene Filbert, MD as Referring Physician (Radiation Oncology) Earlie Server, MD as Consulting Physician (Oncology)  REFERRING PROVIDER: Idelle Crouch, MD REASON FOR VISIT:  Follow up for adjuvant chemotherapy for breast cancer.   HISTORY OF PRESENTING ILLNESS:  Megan Harris is a  68 y.o.  female present for management of  breast cancer  # Stage IB left triple negative breast cancer   03/27/2018 which showed left breast mass. She had diagnostic and ultrasound on 04/07/2018 which showed 0.9cm x 0.9cm x 1.4cm irregular border mixed echotexture mass at the left breast 11 o'clock 7 cm from nipple correlating to the mammographic finding. Ultrasound of the left axilla is negative She underwent biopsy of left breast mass. 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 Estrogen receptor negative, progesterone receptor negative, HER-2 negative.  S/p left lumpectomy and sentinel lymph node biopsy. pT1pN0 05/09/2018 Baseline MUGA showed no focal wall motion abnormality of the left ventricle.  Calculated LVEF 61%  # 05/24/2018- 07/05/2018 Adjuvant Chemotherapy ddAC- # 07/19/2018 started on weekly Taxol 80 mg/m. Starting cycle 4, Taxol was decreased to 67m/m2 # 08/23/2018 Cycle cycle 6, Taxol was decreased to 65 mg/m due to neuropathy. 10/04/2018 patient finished adjuvant chemotherapy with ddAC x 4 followed by 12 weekly Taxol. 12/13/2018, she finished adjuvant radiation.  #Patient was recommended for genetic testing and she declined. Mediport was discontinued per patient's preference  # 03/29/2019 patient  underwent annual diagnostic breast mammogram bilaterally. Mammogram showed new calcifications in the superior lateral right breast spanning 5 mm. Also there is an abnormal lymph node in the low left axilla representing a change from previous mammogram. 04/03/2019 patient underwent left axillary lymph node biopsy and pathology is negative for malignancy, fibroadipose tissue with necrosis and calcifications, compatible with prior radiation therapy.  Lymph node tissue is not identified. 04/10/2019 breast right upper outer quadrant biopsy showed fibroadenomatoid changes with associated calcifications.  Negative for atypia and malignancy.  INTERVAL HISTORY Megan NADERis a 69y.o. female who has above history reviewed by me presents for follow-up of stage I left triple negative breast cancer  Patient reports feeling well today.  No new complaints Patient was seen by Dr. BBary Castillarecently.  10/23/2019 CT abdomen pelvis was obtained for evaluation of pelvic pain. CT showed mild circumferential bladder wall thickening, she follows with urologist and there is a plan for cystoscopy per patient.  CT also showed hepatic steatosis, 2.1 cm right adnexal cystic mass.  11/09/2019, colonoscopy was performed by Dr. BBary Castillawhich showed diverticulosis in the rectosigmoid colon.  2 colon polyps was resected and retrieved.  Pathology is pending. Patient denies any breast concerns.  Patient has urinary urgency/frequency/incontinence.  Review of Systems  Constitutional: Negative for appetite change, chills, fatigue, fever and unexpected weight change.  HENT: Negative for hearing loss and voice change.   Respiratory: Negative for cough and chest tightness.   Cardiovascular: Negative for chest pain.  Gastrointestinal: Negative for abdominal distention, abdominal pain, blood in stool and nausea.  Genitourinary: Positive for bladder incontinence, frequency and urgency. Negative for difficulty urinating.  Musculoskeletal:  Negative for arthralgias and extremity weakness.  Skin: Negative for itching and rash.  Neurological: Positive for numbness.  Endo/Heme/Allergies: Negative for adenopathy.  Psychiatric/Behavioral: Negative for confusion. The patient is not nervous/anxious.     MEDICAL HISTORY:  Past Medical History:  Diagnosis Date  . Abdominal pain, left lower quadrant 2012  . Abnormal LFTs   . Anemia    vitamin d deficiency  . Arthritis   . Asthma   . Back pain 2005   lower back arthritis  . Bone cancer ([place])   . Breast cancer ([place]) 04/2018   left invasive mammary  . Cancer ([place]) 04/26/2018   80m, T1c, N0 triple negative  . Chronic cholecystitis 2012   has had choleycystectomy.  not an issue  . Chronic insomnia   . Constipation 2005  . COPD [place] obstructive pulmonary disease) ([place]   . Degenerative disc disease, cervical   . Diabetes mellitus without complication ([place]   . Diverticulitis   . Endocrine problem 2005  . Family history of bone cancer   . Family history of brain cancer   . Family history of colon cancer   . Family history of lung cancer   . GERD (gastroesophageal reflux disease) 2012  . History of GI diverticular bleed   . History of peptic ulcer disease   . History of pneumonia   . Hyperlipidemia 2012  . Hypertension 2002  . Nausea with vomiting 2012  . Obesity, unspecified 2012  . Personal history of chemotherapy   . Personal history of radiation therapy 2020   invasive mammary  . Pneumonia   . PONV (postoperative nausea and vomiting)   . Special screening for malignant neoplasms, colon 2012  . Vitamin D deficiency     SURGICAL HISTORY: Past Surgical History:  Procedure Laterality Date  . BILATERAL CARPAL TUNNEL RELEASE Bilateral   . BREAST BIOPSY Left 04/13/2018   invasive mamm and DCIS  . BREAST BIOPSY Right 04/10/2019   calcs bx, ribbon marker, path pending  . BREAST BIOPSY Left 04/03/2019   LN bx with Dr BBary Castilla FEATURES COMPATIBLE WITH PRIOR  RADIATION THERAPY  . BREAST EXCISIONAL BIOPSY Left early 90s   neg  . BREAST LUMPECTOMY Left 04/26/2018   Procedure: BREAST LUMPECTOMY WITH EXCISION OF SENTINEL NODE;  Surgeon: BRobert Bellow MD;  Location: [facility] ORS;  Service: General;  Laterality: Left;  . BUNIONECTOMY Bilateral 2003  . CHOLECYSTECTOMY  06/04/2010  . COLON RESECTION  2004   due diverticulitis   . COLON SURGERY    . [place]  [place] . COLONOSCOPY  06/28/2012  . COLONOSCOPY WITH PROPOFOL N/A 11/09/2019   Procedure: COLONOSCOPY WITH PROPOFOL;  Surgeon: BRobert Bellow MD;  Location: [facility] END[facility]COPY;  Service: Endoscopy;  Laterality: N/A;  . COLOSTOMY  2004  . COLOSTOMY REVERSAL  2004  . FOOT SURGERY Bilateral 2012   plantar faciatis  . HAND SURGERY Bilateral    carpal tunnel  . HERNIA REPAIR  2005   at colostomy site after reversal done  . JOINT REPLACEMENT    . KNEE ARTHROPLASTY Right 04/20/2017   Procedure: COMPUTER ASSISTED TOTAL KNEE ARTHROPLASTY;  Surgeon: HDereck Leep MD;  Location: [facility] ORS;  Service: Orthopedics;  Laterality: Right;  . KNEE ARTHROSCOPY Right 02/03/2015   Procedure: ARTHROSCOPY right knee, partial medial menisectomy, condyle malleolus, patella and femoral;  Surgeon: JDereck Leep MD;  Location: [facility] ORS;  Service: Orthopedics;  Laterality: Right;  . MASTECTOMY Left    partial lumpectomy'  . NASAL SINUS SURGERY     2005  . PORTACATH PLACEMENT Right 04/26/2018  Procedure: INSERTION PORT-A-CATH RIGHT;  Surgeon: Robert Bellow, MD;  Location: [facility] ORS;  Service: General;  Laterality: Right;  . SALPINGOOPHORECTOMY  1998  . status post colectomy    . TONSILLECTOMY    . TUBAL LIGATION  1978  . UPPER GI ENDOSCOPY  06/28/2012  . VAGINAL HYSTERECTOMY  1987    SOCIAL HISTORY: Social History   Socioeconomic History  . Marital status: Married    Spouse name: Francee Piccolo  . Number of children: 2  . Years of education: Not on file  . Highest education level: Not on  file  Occupational History  . Occupation: worked at [employer] in [place]: retired  Tobacco Use  . Smoking status: Never Smoker  . Smokeless tobacco: Never Used  Vaping Use  . Vaping Use: Never used  Substance and Sexual Activity  . Alcohol use: Not Currently  . Drug use: No  . Sexual activity: Yes    Birth control/protection: Surgical  Other Topics Concern  . Not on file  Social History Narrative  . Not on file   Social Determinants of Health   Financial Resource Strain:   . Difficulty of Paying Living Expenses: Not on file  Food Insecurity:   . Worried About Charity fundraiser in the Last Year: Not on file  . Ran Out of Food in the Last Year: Not on file  Transportation Needs:   . Lack of Transportation (Medical): Not on file  . Lack of Transportation (Non-Medical): Not on file  Physical Activity:   . Days of Exercise per Week: Not on file  . Minutes of Exercise per Session: Not on file  Stress:   . Feeling of Stress : Not on file  Social Connections:   . Frequency of Communication with Friends and Family: Not on file  . Frequency of Social Gatherings with Friends and Family: Not on file  . Attends Religious Services: Not on file  . Active Member of Clubs or Organizations: Not on file  . Attends Archivist Meetings: Not on file  . Marital Status: Not on file  Intimate Partner Violence:   . Fear of Current or Ex-Partner: Not on file  . Emotionally Abused: Not on file  . Physically Abused: Not on file  . Sexually Abused: Not on file   Lives at home with husband.  FAMILY HISTORY: Family History  Problem Relation Age of Onset  . Heart disease Father   . Diabetes Paternal Grandmother   . Diabetes Paternal Grandfather   . Brain cancer Mother 20  . Bone cancer Sister 81  . Colon cancer Maternal Uncle        dx 50s-60s  . Throat cancer Cousin   . Leukemia Cousin   . Lung cancer Cousin   . Ovarian cancer Neg Hx   . Breast cancer Neg Hx   .  Prostate cancer Neg Hx   . Kidney cancer Neg Hx   . Bladder Cancer Neg Hx     ALLERGIES:  is allergic to codeine, penicillins, and adhesive [tape].  MEDICATIONS:  Current Outpatient Medications  Medication Sig Dispense Refill  . amLODipine (NORVASC) 5 MG tablet Take 5 mg by mouth daily.    Marland Kitchen atorvastatin (LIPITOR) 10 MG tablet Take 10 mg by mouth daily.    Marland Kitchen gabapentin (NEURONTIN) 300 MG capsule TAKE 2 CAPSULES BY MOUTH 3 TIMES DAILY (Patient taking differently: 2 (two) times daily. ) 180 capsule 0  . hydrochlorothiazide (HYDRODIURIL)  25 MG tablet Take 25 mg by mouth daily.    . lansoprazole (PREVACID) 30 MG capsule Take 30 mg by mouth 2 (two) times daily.     Marland Kitchen levothyroxine (SYNTHROID, LEVOTHROID) 50 MCG tablet Take 50 mcg by mouth daily before breakfast.     . loratadine (CLARITIN) 10 MG tablet Take 10 mg by mouth daily.    Marland Kitchen losartan (COZAAR) 100 MG tablet Take 100 mg by mouth daily.    . Potassium 99 MG TABS Take by mouth daily.    Marland Kitchen zinc gluconate 50 MG tablet Take 50 mg by mouth daily.    Marland Kitchen zolpidem (AMBIEN) 10 MG tablet Take 10 mg by mouth at bedtime.     . Cholecalciferol 125 MCG (5000 UT) capsule Take 1,000 Units by mouth daily. (Patient not taking: Reported on 11/12/2019)     No current facility-administered medications for this visit.     PHYSICAL EXAMINATION: ECOG PERFORMANCE STATUS: 1 - Symptomatic but completely ambulatory Vitals:   11/12/19 1040  BP: 129/86  Pulse: 73  Resp: 18  Temp: 98.1 F (36.7 C)   Filed Weights   11/12/19 1040  Weight: 245 lb (111.1 kg)    Physical Exam Constitutional:      General: She is not in acute distress.    Appearance: She is obese.  HENT:     Head: Normocephalic and atraumatic.  Eyes:     General: No scleral icterus.    Pupils: Pupils are equal, round, and reactive to light.  Cardiovascular:     Rate and Rhythm: Normal rate and regular rhythm.     Heart sounds: Normal heart sounds.  Pulmonary:     Effort: Pulmonary  effort is normal. No respiratory distress.     Breath sounds: No wheezing.  Abdominal:     General: Bowel sounds are normal. There is no distension.     Palpations: Abdomen is soft. There is no mass.     Tenderness: There is no abdominal tenderness.  Musculoskeletal:        General: No deformity. Normal range of motion.     Cervical back: Normal range of motion and neck supple.  Skin:    General: Skin is warm and dry.     Findings: No erythema or rash.  Neurological:     Mental Status: She is alert and oriented to person, place, and time. Mental status is at baseline.     Cranial Nerves: No cranial nerve deficit.     Coordination: Coordination normal.  Psychiatric:        Mood and Affect: Mood normal.   Patient declined breast examination today.  We will perform breast examination at the next visit.  LABORATORY DATA:  I have reviewed the data as listed Lab Results  Component Value Date   WBC 5.3 11/12/2019   HGB 13.3 11/12/2019   HCT 39.7 11/12/2019   MCV 87.8 11/12/2019   PLT 164 11/12/2019   Recent Labs    04/03/19 0959 04/03/19 0959 07/13/19 0921 10/22/19 1115 11/12/19 1009  NA 138   < > 139 138 136  K 3.6   < > 3.8 3.5 3.2*  CL 99   < > 100 99 98  CO2 28   < > 28 30 26   GLUCOSE 159*   < > 161* 156* 169*  BUN 17   < > 15 13 18   CREATININE 0.66   < > 0.69 0.73 0.74  CALCIUM 9.3   < > 8.9  9.4 8.7*  GFRNONAA >60   < > >60 >60 >60  GFRAA >60  --  >60 >60  --   PROT 7.1  --  7.0  --  7.0  ALBUMIN 4.0  --  3.9  --  3.9  AST 57*  --  62*  --  40  ALT 72*  --  72*  --  57*  ALKPHOS 85  --  82  --  83  BILITOT 0.8  --  0.9  --  0.9   < > = values in this interval not displayed.   Iron/TIBC/Ferritin/ %Sat No results found for: IRON, TIBC, FERRITIN, IRONPCTSAT   RADIOGRAPHIC STUDIES: I have personally reviewed the radiological images as listed and agreed with the findings in the report. CT ABDOMEN PELVIS W CONTRAST  Result Date: 10/23/2019 CLINICAL DATA:  Pelvic  pain for 3 weeks.  History of breast cancer. EXAM: CT ABDOMEN AND PELVIS WITH CONTRAST TECHNIQUE: Multidetector CT imaging of the abdomen and pelvis was performed using the standard protocol following bolus administration of intravenous contrast. CONTRAST:  14m OMNIPAQUE IOHEXOL 300 MG/ML  SOLN COMPARISON:  April 16, 2010 FINDINGS: Lower chest: No acute abnormality. Hepatobiliary: Hepatic steatosis. Status post cholecystectomy. The portal and hepatic veins are patent. No extrahepatic biliary ductal dilation. Pancreas: Unremarkable. No pancreatic ductal dilatation or surrounding inflammatory changes. Spleen: Normal in size without focal abnormality.  Splenule. Adrenals/Urinary Tract: The adrenal glands are unremarkable. No hydronephrosis. The kidneys enhance symmetrically. Revisualization of the renal cyst of the inferior pole of the LEFT kidney. Additional renal cyst of the interpolar LEFT kidney. Punctate foci of air within the bladder. The bladder is decompressed with mild circumferential bladder wall thickening and adjacent fat stranding. Stomach/Bowel: No evidence of bowel obstruction. Scattered diverticulosis. Moderate rectal stool ball. Vascular/Lymphatic: Atherosclerotic calcifications. No suspicious lymph nodes. Reproductive: Status post hysterectomy. In the RIGHT adnexa, there is a cystic mass which measures 2.1 x 1.8 cm (series 2, image 81). Other: Status post abdominal hernia mesh repair. Musculoskeletal: Sclerotic lesion of L3, unchanged and most consistent with a bone island. Degenerative changes of the lumbar spine. IMPRESSION: 1. The bladder is decompressed with mild circumferential bladder wall thickening and adjacent fat stranding. Punctate foci of air within the bladder. Correlate for recent instrumentation. Recommend correlation with urine analysis. 2. Hepatic steatosis. 3. Moderate rectal stool ball. 4. There is a 2.1 cm RIGHT adnexal cystic mass. Further evaluation with pelvic ultrasound is  recommended. Aortic Atherosclerosis (ICD10-I70.0). Electronically Signed   By: SValentino SaxonMD   On: 10/23/2019 16:02      ASSESSMENT & PLAN:  1. 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 ([place]   2. Neuropathy   3. Transaminitis   4. Adnexal mass   Cancer Staging 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 ([facility] Staging form: Breast, AJCC 8th Edition - Clinical stage from 04/28/2018: Stage IB (cT1c, cN0, cM0, G3, ER-, PR-, HER2-) - Signed by YEarlie Server MD on 04/29/2018 - Pathologic stage from 05/25/2018: Stage IB (pT1c, pN0(sn), cM0, G3, ER-, PR-, HER2-) - Signed by YEarlie Server MD on 05/25/2018   # Stage IB left triple negative breast cancer S/p adjuvant chemotherapy.  Status post adjuvant radiation. Labs reviewed and discussed with patient. Patient is doing well clinically. Recommend patient to take calcium 1200 mg daily, vitamin D 1000 units daily.  Chronic transaminitis, ultrasound liver in January showed fatty liver disease.  Stable to slightly improved  transaminitis.   #Pre-existing neuropathy, exacerbated by chemotherapy, continue gabapentin.  Patient has decreased her dose to 300 mg 2 times daily.   #Adnexal cystic mass 2.1 cm patient has an history of nephrectomy.  Recommend pelvic ultrasound.  Patient will check with Dr. Bary Castilla if he plans to order ultrasound or not. #Urinary symptoms, continue follow-up with urology.  #Follow-up  4 months. . All questions were answered. The patient knows to call the clinic with any problems questions or concerns.  Earlie Server, MD, PhD 11/12/2019

## 2019-11-13 ENCOUNTER — Other Ambulatory Visit: Payer: Self-pay | Admitting: Urology

## 2019-11-13 ENCOUNTER — Ambulatory Visit: Payer: PPO | Admitting: Urology

## 2019-11-13 VITALS — BP 153/95 | HR 89

## 2019-11-13 DIAGNOSIS — R300 Dysuria: Secondary | ICD-10-CM | POA: Diagnosis not present

## 2019-11-13 DIAGNOSIS — N816 Rectocele: Secondary | ICD-10-CM

## 2019-11-13 LAB — URINALYSIS, COMPLETE
Bilirubin, UA: NEGATIVE
Glucose, UA: NEGATIVE
Ketones, UA: NEGATIVE
Nitrite, UA: NEGATIVE
Protein,UA: NEGATIVE
Specific Gravity, UA: 1.025 (ref 1.005–1.030)
Urobilinogen, Ur: 0.2 mg/dL (ref 0.2–1.0)
pH, UA: 5 (ref 5.0–7.5)

## 2019-11-13 LAB — CANCER ANTIGEN 27.29: CA 27.29: 22.2 U/mL (ref 0.0–38.6)

## 2019-11-13 LAB — MICROSCOPIC EXAMINATION
Epithelial Cells (non renal): >10 /hpf — AB (ref 0–10)
WBC, UA: >30 /hpf — AB (ref 0–5)

## 2019-11-13 LAB — CANCER ANTIGEN 15-3: CA 15-3: 22.7 U/mL (ref 0.0–25.0)

## 2019-11-13 MED ORDER — SULFAMETHOXAZOLE-TRIMETHOPRIM 800-160 MG PO TABS: 1.0000 | ORAL_TABLET | Freq: Two times a day (BID) | ORAL | 0 refills | Status: DC

## 2019-11-13 MED ORDER — SULFAMETHOXAZOLE-TRIMETHOPRIM 800-160 MG PO TABS
1.0000 | ORAL_TABLET | Freq: Two times a day (BID) | ORAL | Status: DC
  Administered 2019-11-13: 1 via ORAL

## 2019-11-14 LAB — CYTOLOGY - NON PAP

## 2019-11-15 ENCOUNTER — Encounter: Payer: Self-pay | Admitting: Urology

## 2019-11-16 ENCOUNTER — Telehealth: Payer: Self-pay

## 2019-11-16 LAB — CULTURE, URINE COMPREHENSIVE

## 2019-11-16 MED ORDER — SULFAMETHOXAZOLE-TRIMETHOPRIM 800-160 MG PO TABS: 1.0000 | ORAL_TABLET | Freq: Two times a day (BID) | ORAL | 0 refills | Status: AC

## 2019-11-16 NOTE — Telephone Encounter (Signed)
Sent medication to pharmacy. Patient aware  And verbalized understanding.

## 2019-11-16 NOTE — Telephone Encounter (Signed)
-----   Message from Hollice Espy, MD sent at 11/16/2019  1:51 PM EDT ----- Please treat with Bactrim DS twice daily for 5 days.  Hollice Espy, MD

## 2019-11-21 ENCOUNTER — Other Ambulatory Visit: Payer: Self-pay | Admitting: General Surgery

## 2019-11-21 DIAGNOSIS — N9489 Other specified conditions associated with female genital organs and menstrual cycle: Secondary | ICD-10-CM

## 2019-11-25 IMAGING — MG DIGITAL DIAGNOSTIC UNILATERAL LEFT MAMMOGRAM WITH TOMO AND CAD
4 series · 4 of 12 positions shown · non-contrast
Comparison: Previous exam(s).

CLINICAL DATA: Callback from screening mammogram for a possible
mass left breast.

EXAM:
DIGITAL DIAGNOSTIC LEFT MAMMOGRAM WITH TOMO
ULTRASOUND LEFT BREAST

[L CC synth-2D]
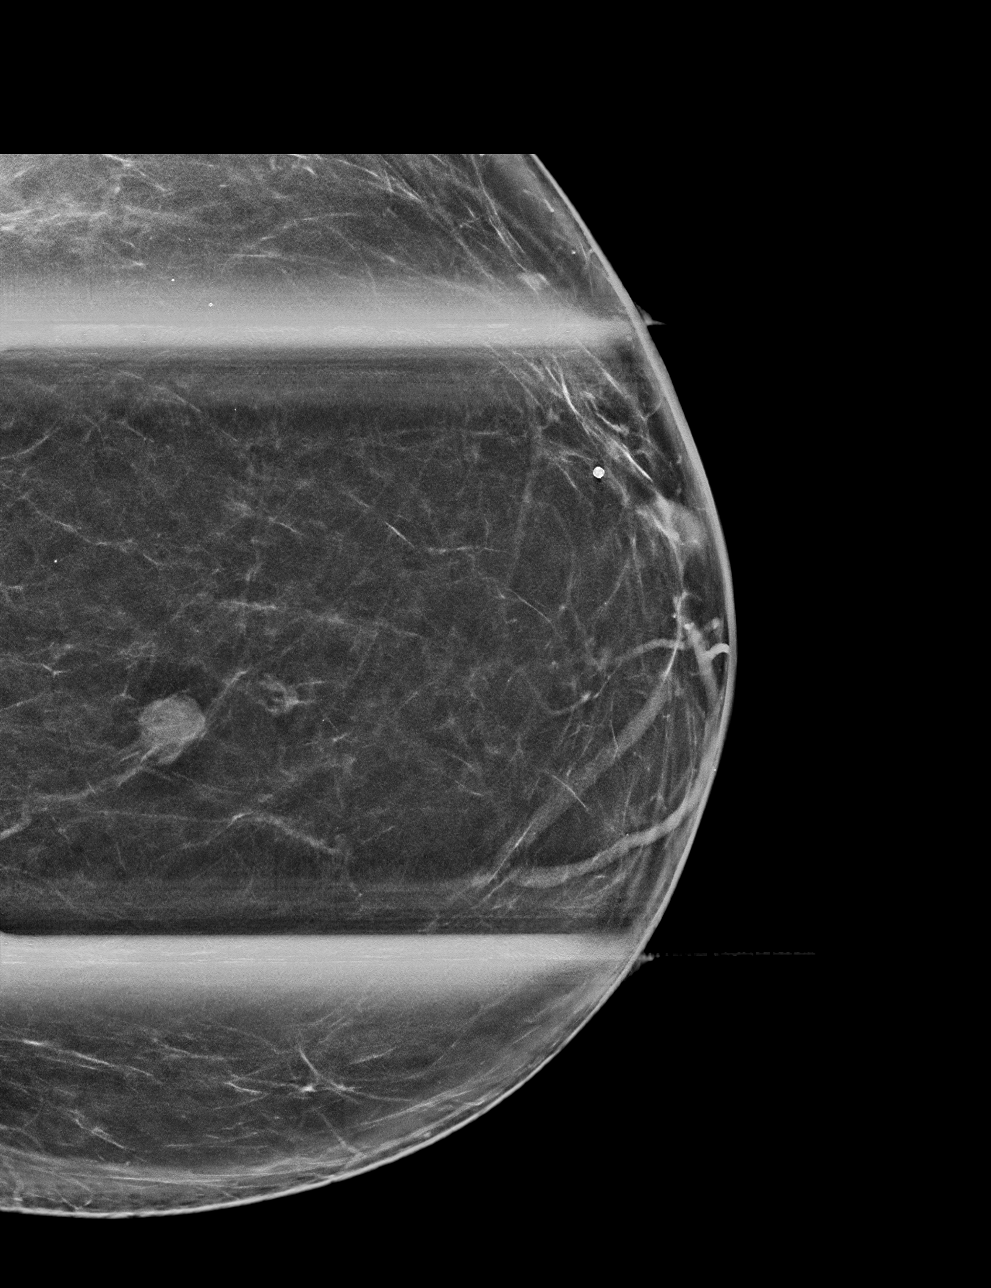

[L MLO synth-2D]
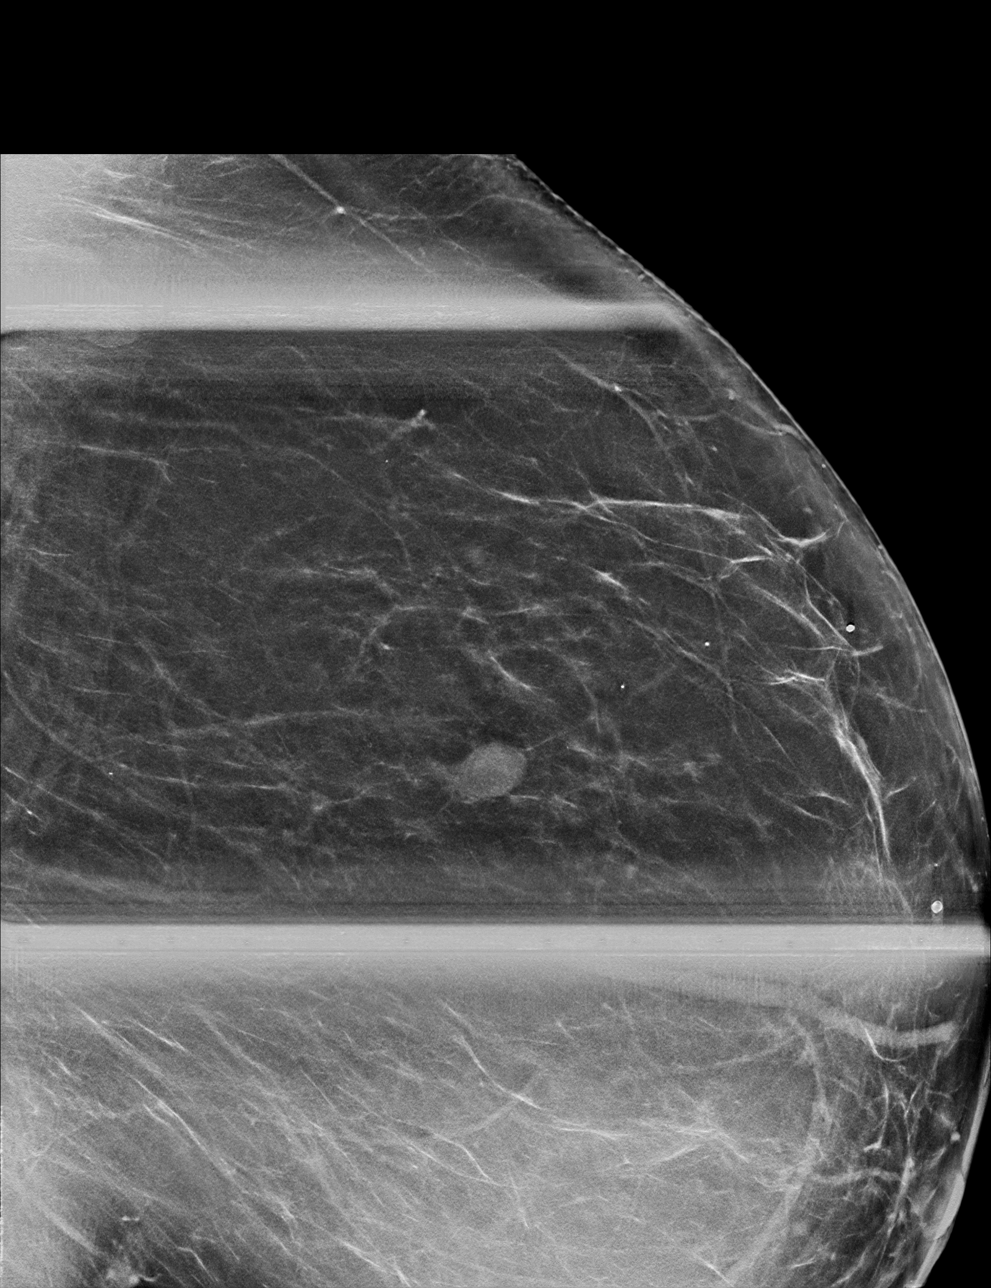

[L CC tomo · tomo slice 35/68.0]
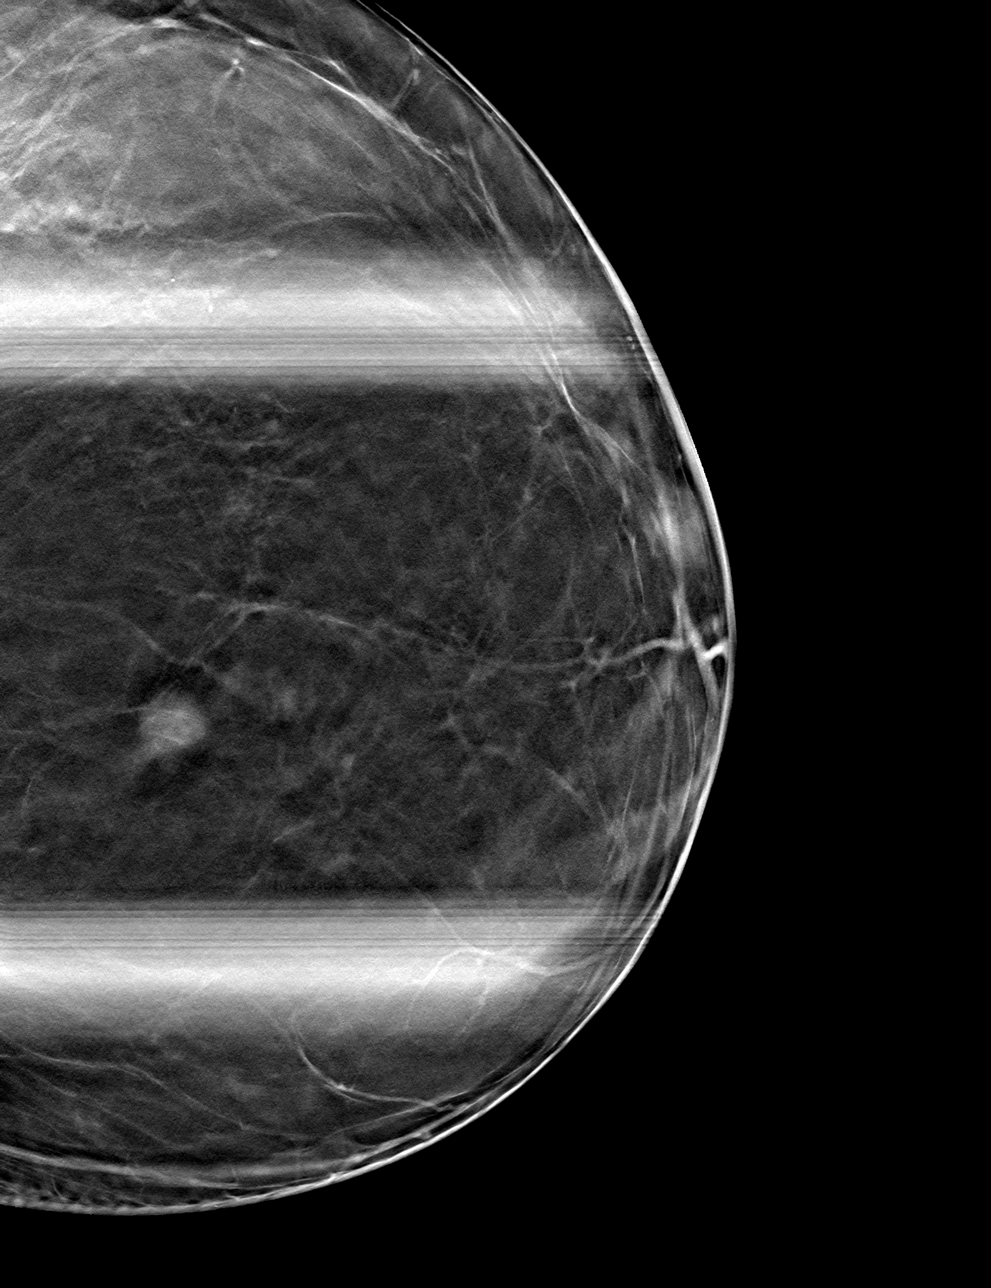

[L MLO tomo · tomo slice 37/74.0]
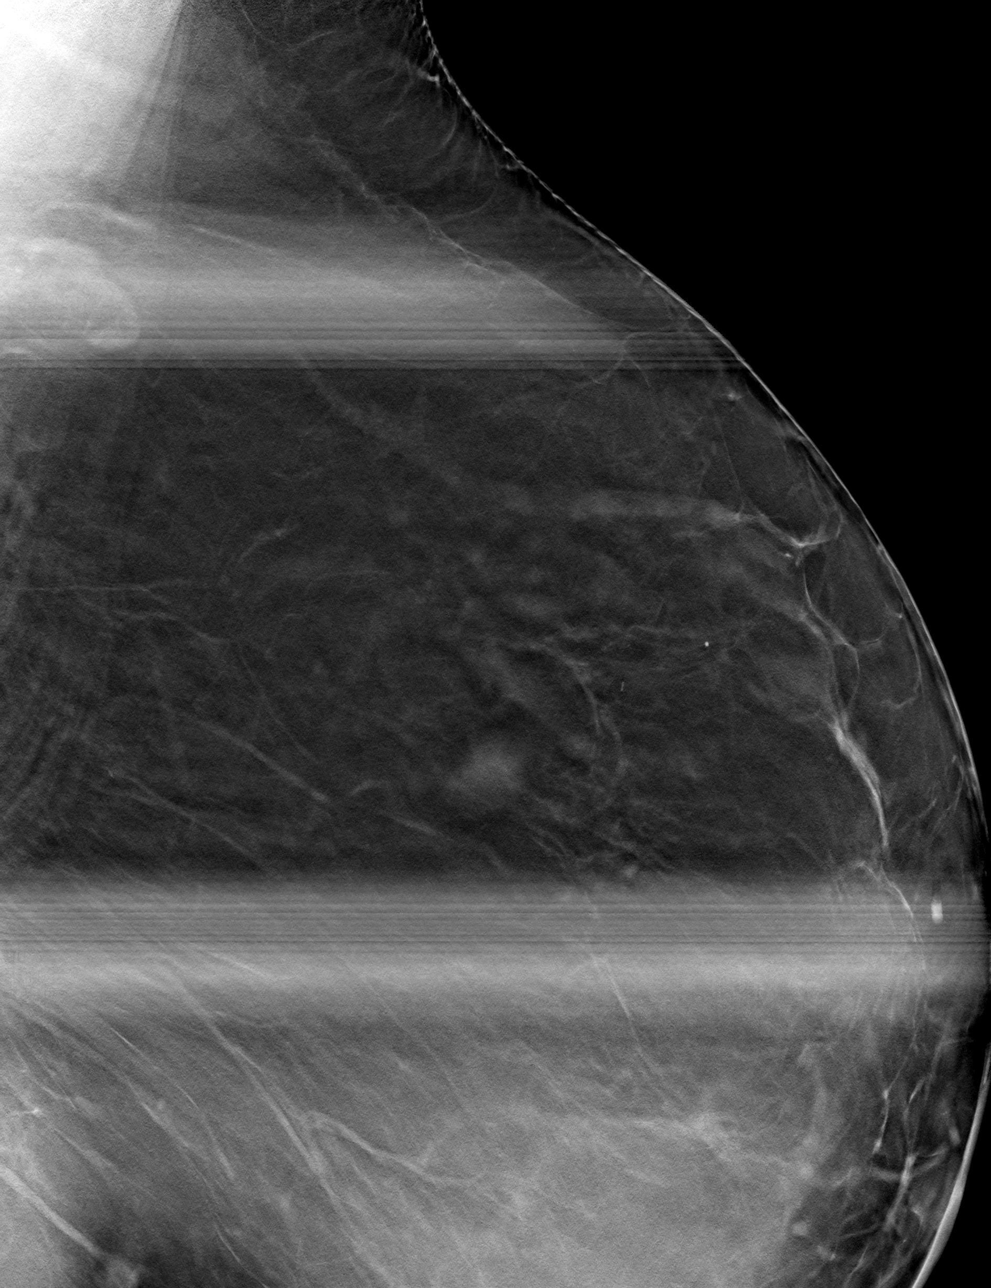

[4 of 12 positions shown; findings below may reference images not displayed]

ACR Breast Density Category b: There are scattered areas of
fibroglandular density.
FINDINGS: Spot compression CC and MLO views of the left breast are submitted.
The previously questioned mass in the upper slight medial left
breast persists on additional views.

Targeted ultrasound is performed, showing a 0.9 x 0.9 x 1.4 cm
irregular border mixed echotexture mass at the left breast 11
o'clock 7 cm from nipple correlating to the mammographic finding.
Ultrasound of the left axilla is negative.
IMPRESSION: Suspicious findings.

RECOMMENDATION:
Ultrasound-guided core biopsy of left breast mass.

I have discussed the findings and recommendations with the patient.
Results were also provided in writing at the conclusion of the
visit. If applicable, a reminder letter will be sent to the patient
regarding the next appointment.

BI-RADS CATEGORY  4: Suspicious.

## 2019-11-27 ENCOUNTER — Ambulatory Visit (INDEPENDENT_AMBULATORY_CARE_PROVIDER_SITE_OTHER): Payer: PPO | Admitting: Urology

## 2019-11-27 ENCOUNTER — Other Ambulatory Visit: Payer: Self-pay

## 2019-11-27 VITALS — BP 158/97 | HR 84

## 2019-11-27 DIAGNOSIS — R350 Frequency of micturition: Secondary | ICD-10-CM

## 2019-11-27 LAB — URINALYSIS, COMPLETE
Bilirubin, UA: NEGATIVE
Glucose, UA: NEGATIVE
Ketones, UA: NEGATIVE
Leukocytes,UA: NEGATIVE
Nitrite, UA: NEGATIVE
Protein,UA: NEGATIVE
Specific Gravity, UA: 1.025 (ref 1.005–1.030)
Urobilinogen, Ur: 0.2 mg/dL (ref 0.2–1.0)
pH, UA: 5.5 (ref 5.0–7.5)

## 2019-11-27 LAB — MICROSCOPIC EXAMINATION: Epithelial Cells (non renal): >10 /hpf — AB (ref 0–10)

## 2019-11-27 LAB — BLADDER SCAN AMB NON-IMAGING: Scan Result: 0

## 2019-11-27 NOTE — Progress Notes (Signed)
11/27/2019 10:12 AM   [name] 1950/12/28 [id number]  Referring provider: Idelle Crouch, MD [facility],  [place] [address]  Chief Complaint  Patient presents with  . Dysuria    HPI: 69 year old female who returns today for follow-up of urinary symptoms.  She has a significant history of irritative voiding symptoms which are relatively abrupt in onset.  When she was first evaluated, she had no infection.  She was given samples of Myrbetriq help with her symptoms.    She was ultimately diagnosed with UTI which was treated with Bactrim.  She grew 25-50 K of E. coli at the time of cystoscopy and preprocedure urinalysis.  Her UA was fairly unremarkable at the time.  Cystoscopically, she had diffuse bladder erythema consistent with infection.  Urine cytology was negative.  PVR today 0.  Today, she continues to struggle with diffuse lower abdominal pain that radiates to her back bilaterally.  This is nonspecific.  Is unrelated to voiding.  Her urinary urgency and frequency has improved especially the daytime.  She is taking Myrbetriq 25 mg right now not sure if she like to continue this or not.  It has made a difference in her urinary symptoms.  She is most bothered by urinary urgency at nighttime, when she wakes up and has to urinate, has little time to get there.  She denies any dysuria or gross hematuria today.  She is following up with gynecology for a possible ovarian cyst, seeing Dr. Kenton Kingfisher next week.   PMH: Past Medical History:  Diagnosis Date  . Abdominal pain, left lower quadrant 2012  . Abnormal LFTs   . Anemia    vitamin d deficiency  . Arthritis   . Asthma   . Back pain 2005   lower back arthritis  . Bone cancer ([place])   . Breast cancer ([place]) 04/2018   left invasive mammary  . 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 Chronic cholecystitis 2012   has had choleycystectomy.  not an issue  .  Chronic insomnia   . Constipation 2005  . COPD (chronic obstructive pulmonary disease) ([place])   . Degenerative disc disease, cervical   . Diabetes mellitus without complication ([place])   . Diverticulitis   . Endocrine problem 2005  . Family history of bone cancer   . Family history of brain cancer   . Family history of colon cancer   . Family history of lung cancer   . GERD (gastroesophageal reflux disease) 2012  . History of GI diverticular bleed   . History of peptic ulcer disease   . History of pneumonia   . Hyperlipidemia 2012  . Hypertension 2002  . Nausea with vomiting 2012  . Obesity, unspecified 2012  . Personal history of chemotherapy   . Personal history of radiation therapy 2020   invasive mammary  . Pneumonia   . PONV (postoperative nausea and vomiting)   . Special screening for malignant neoplasms, colon 2012  . Vitamin D deficiency     Surgical History: Past Surgical History:  Procedure Laterality Date  . BILATERAL CARPAL TUNNEL RELEASE Bilateral   . BREAST BIOPSY Left 04/13/2018   invasive mamm and DCIS  . BREAST BIOPSY Right 04/10/2019   calcs bx, ribbon marker, path pending  . BREAST BIOPSY Left 04/03/2019   LN bx with Dr Bary Castilla, FEATURES COMPATIBLE WITH PRIOR RADIATION THERAPY  . BREAST EXCISIONAL BIOPSY Left early 58s  neg  . BREAST LUMPECTOMY Left 04/26/2018   Procedure: BREAST LUMPECTOMY WITH EXCISION OF SENTINEL NODE;  Surgeon: Robert Bellow, MD;  Location: [facility] ORS;  Service: General;  Laterality: Left;  . BUNIONECTOMY Bilateral 2003  . CHOLECYSTECTOMY  06/04/2010  . COLON RESECTION  2004   due diverticulitis   . COLON SURGERY    . COLONOSCOPY  2005   [place], Dr. Bary Castilla  . COLONOSCOPY  06/28/2012  . COLONOSCOPY WITH PROPOFOL N/A 11/09/2019   Procedure: COLONOSCOPY WITH PROPOFOL;  Surgeon: Robert Bellow, MD;  Location: [facility] ENDOSCOPY;  Service: Endoscopy;  Laterality: N/A;  . COLOSTOMY  2004  . COLOSTOMY REVERSAL  2004  . FOOT SURGERY  Bilateral 2012   plantar faciatis  . HAND SURGERY Bilateral    carpal tunnel  . HERNIA REPAIR  2005   at colostomy site after reversal done  . JOINT REPLACEMENT    . KNEE ARTHROPLASTY Right 04/20/2017   Procedure: COMPUTER ASSISTED TOTAL KNEE ARTHROPLASTY;  Surgeon: Dereck Leep, MD;  Location: [facility] ORS;  Service: Orthopedics;  Laterality: Right;  . KNEE ARTHROSCOPY Right 02/03/2015   Procedure: ARTHROSCOPY right knee, partial medial menisectomy, condyle malleolus, patella and femoral;  Surgeon: Dereck Leep, MD;  Location: [facility] ORS;  Service: Orthopedics;  Laterality: Right;  . MASTECTOMY Left    partial lumpectomy'  . NASAL SINUS SURGERY     2005  . PORTACATH PLACEMENT Right 04/26/2018   Procedure: INSERTION PORT-A-CATH RIGHT;  Surgeon: Robert Bellow, MD;  Location: [facility] ORS;  Service: General;  Laterality: Right;  . SALPINGOOPHORECTOMY  1998  . status post colectomy    . TONSILLECTOMY    . TUBAL LIGATION  1978  . UPPER GI ENDOSCOPY  06/28/2012  . VAGINAL HYSTERECTOMY  1987    Home Medications:  Allergies as of 11/27/2019      Reactions   Codeine Other (See Comments)   Goes to sleep and unable to wake up.   Penicillins Hives   Did it involve swelling of the face/tongue/throat, SOB, or low BP? No Did it involve sudden or severe rash/hives, skin peeling, or any reaction on the inside of your mouth or nose? No Did you need to seek medical attention at a hospital or doctor's office? Yes When did it last happen?50s If all above answers are "NO", may proceed with cephalosporin use.   Adhesive [tape] Dermatitis   Skin irritation      Medication List       Accurate as of November 27, 2019 10:12 AM. If you have any questions, ask your nurse or doctor.        amLODipine 5 MG tablet Commonly known as: NORVASC Take 5 mg by mouth daily.   atorvastatin 10 MG tablet Commonly known as: LIPITOR Take 10 mg by mouth daily.   gabapentin 300 MG capsule Commonly known as:  NEURONTIN TAKE 2 CAPSULES BY MOUTH 3 TIMES DAILY What changed: See the new instructions.   hydrochlorothiazide 25 MG tablet Commonly known as: HYDRODIURIL Take 25 mg by mouth daily.   lansoprazole 30 MG capsule Commonly known as: PREVACID Take 30 mg by mouth 2 (two) times daily.   levothyroxine 50 MCG tablet Commonly known as: SYNTHROID Take 50 mcg by mouth daily before breakfast.   loratadine 10 MG tablet Commonly known as: CLARITIN Take 10 mg by mouth daily.   losartan 100 MG tablet Commonly known as: COZAAR Take 100 mg by mouth daily.   Potassium 99 MG Tabs Take  by mouth daily.   zinc gluconate 50 MG tablet Take 50 mg by mouth daily.   zolpidem 10 MG tablet Commonly known as: AMBIEN Take 10 mg by mouth at bedtime.       Allergies:  Allergies  Allergen Reactions  . Codeine Other (See Comments)    Goes to sleep and unable to wake up.  Marland Kitchen Penicillins Hives    Did it involve swelling of the face/tongue/throat, SOB, or low BP? No Did it involve sudden or severe rash/hives, skin peeling, or any reaction on the inside of your mouth or nose? No Did you need to seek medical attention at a hospital or doctor's office? Yes When did it last happen?50s If all above answers are "NO", may proceed with cephalosporin use.   . Adhesive [Tape] Dermatitis    Skin irritation    Family History: Family History  Problem Relation Age of Onset  . Heart disease Father   . Diabetes Paternal Grandmother   . Diabetes Paternal Grandfather   . Brain cancer Mother 61  . Bone cancer Sister 25  . Colon cancer Maternal Uncle        dx 50s-60s  . Throat cancer Cousin   . Leukemia Cousin   . Lung cancer Cousin   . Ovarian cancer Neg Hx   . Breast cancer Neg Hx   . Prostate cancer Neg Hx   . Kidney cancer Neg Hx   . Bladder Cancer Neg Hx     Social History:  reports that she has never smoked. She has never used smokeless tobacco. She reports previous alcohol use. She reports  that she does not use drugs.   Physical Exam: BP (!) 158/97   Pulse 84   Constitutional:  Alert and oriented, No acute distress.  Accompanied by daughter today. HEENT: [place] AT, moist mucus membranes.  Trachea midline, no masses. Cardiovascular: No clubbing, cyanosis, or edema. Respiratory: Normal respiratory effort, no increased work of breathing. Skin: No rashes, bruises or suspicious lesions. Neurologic: Grossly intact, no focal deficits, moving all 4 extremities. Psychiatric: Normal mood and affect.  Laboratory Data: Lab Results  Component Value Date   WBC 5.3 11/12/2019   HGB 13.3 11/12/2019   HCT 39.7 11/12/2019   MCV 87.8 11/12/2019   PLT 164 11/12/2019    Lab Results  Component Value Date   CREATININE 0.74 11/12/2019     Lab Results  Component Value Date   HGBA1C 5.6 09/13/2018    Urinalysis Urinalysis negative for microscopic blood or leukocytes.  Nitrate negative.   Assessment & Plan:    1. Urinary frequency Improving after UTI was treated as well as Myrbetriq 25 mg  Offered her a prescription for this today, she is not sure if she wants to continue or not.  She will run out of these samples in the next 1 to 2 weeks.  She will let us know if she like to continue this.  We also discussed perhaps pushing the dose to 50 mg as needed.  Urinalysis today is unremarkable.  Adequate emptying.  - Urinalysis, Complete - BLADDER SCAN AMB NON-IMAGING   2. Lower abdominal / pelvic pain Etiology remains unclear, unlikely related to bladder  Follow-up with GYN as scheduled   We will continue to follow her closely given her presentation.  We will plan for reassessment of her symptoms in 3 months with UA/urine cytology.  Hollice Espy, MD  [facility] [address] [place], [place] [address] [phone]  [phone]  

## 2019-11-30 LAB — CULTURE, URINE COMPREHENSIVE

## 2019-12-01 IMAGING — MG US BREAST BX W LOC DEV 1ST LESION IMG BX SPEC US GUIDE*L*
1 series · 8 of 8 positions shown · non-contrast
Comparison: Previous exam(s).
COMPARISON: Previous exam(s).

Addendum:
CLINICAL DATA: Indeterminate left breast mass 11 o'clock position.

EXAM:
ULTRASOUND GUIDED LEFT BREAST CORE NEEDLE BIOPSY

[Series 1: MG view · 0.06mm/px · 8 of 10 slices shown]
[im 1/10]
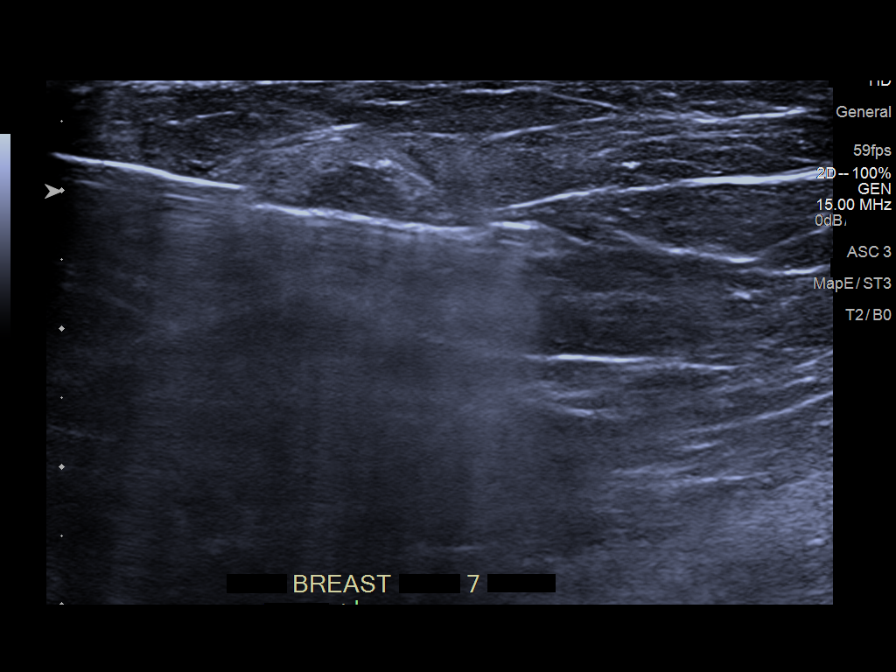
[im 2/10]
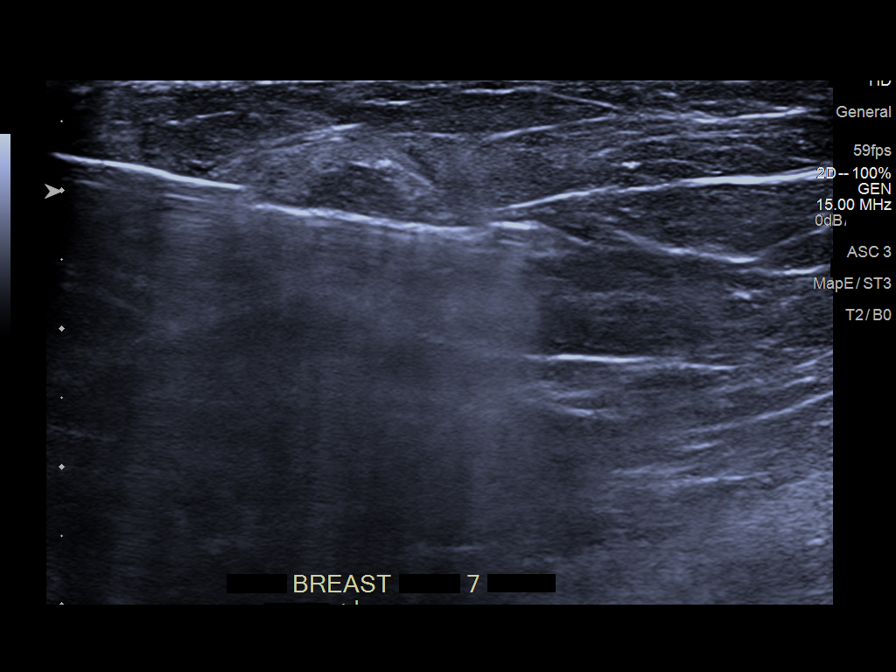
[im 3/10]
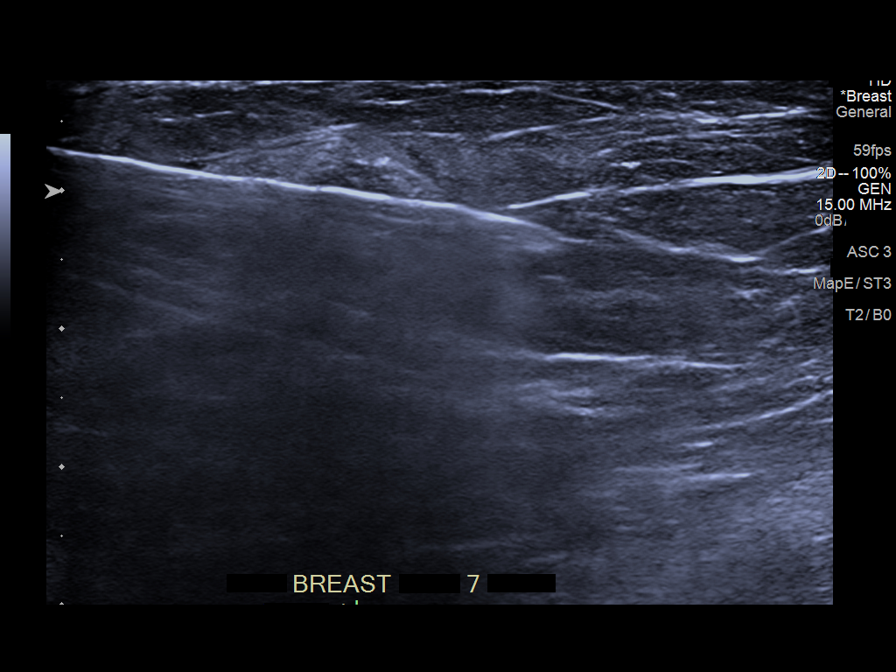
[im 4/10]
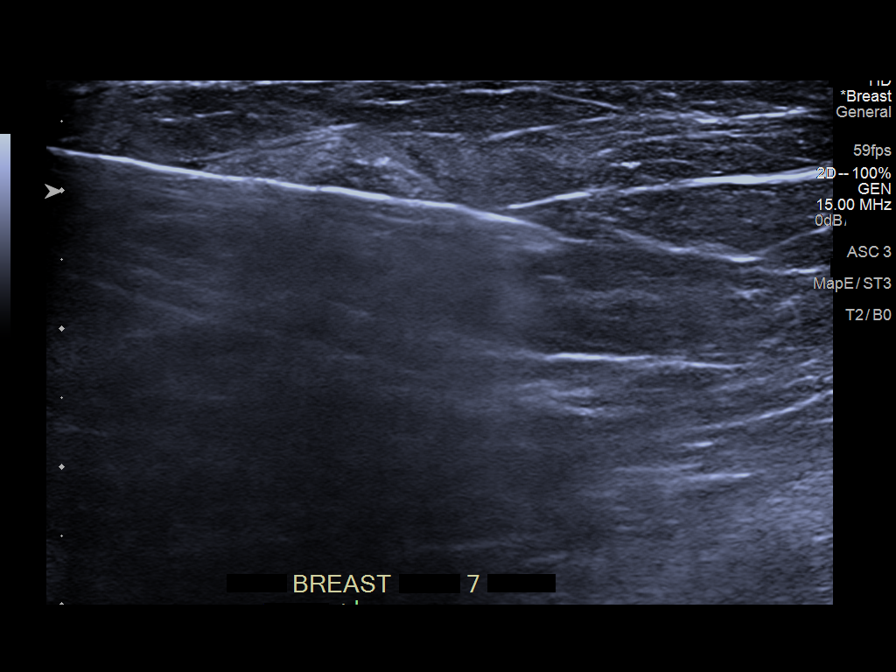
[im 6/10]
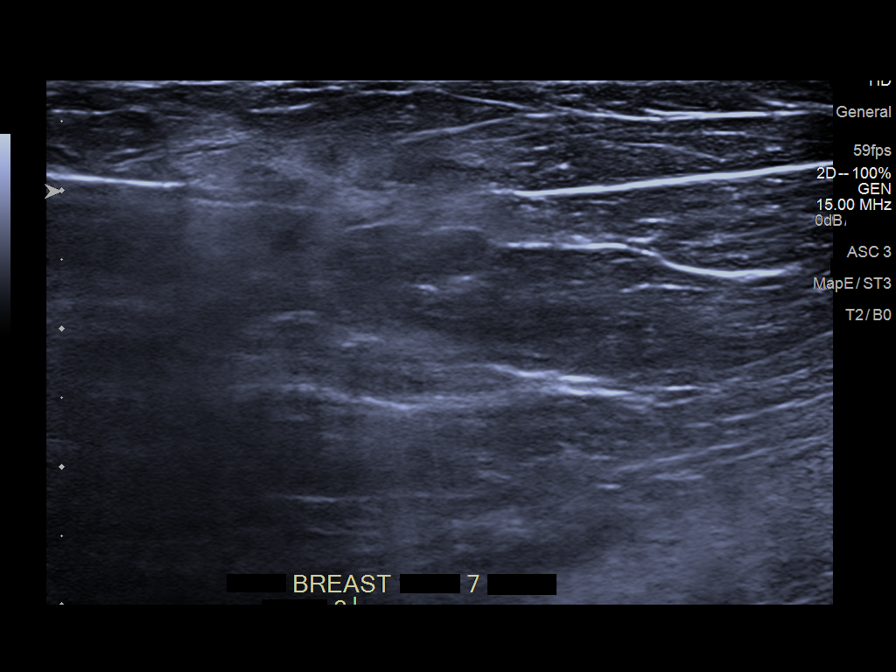
[im 7/10]
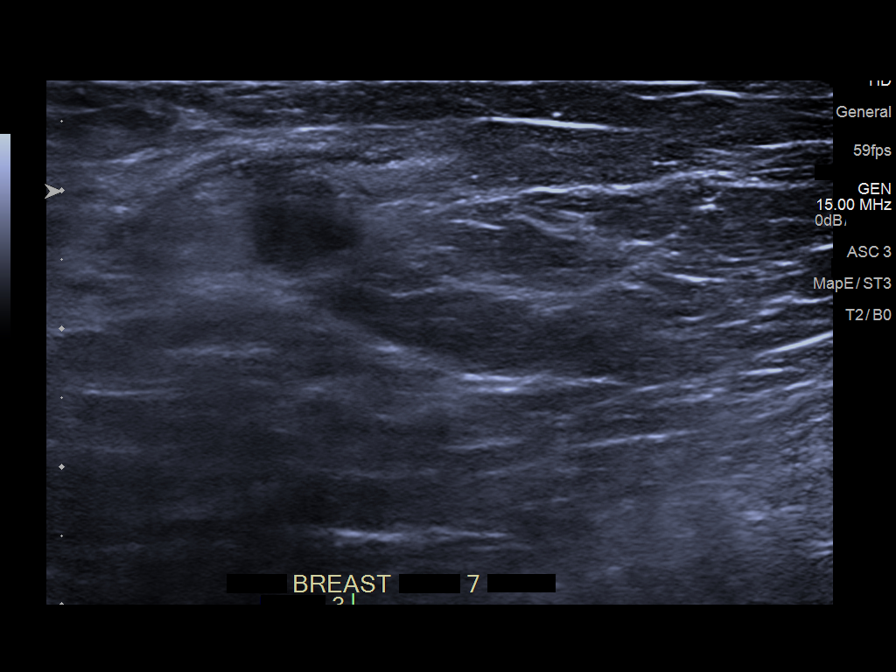
[im 8/10]
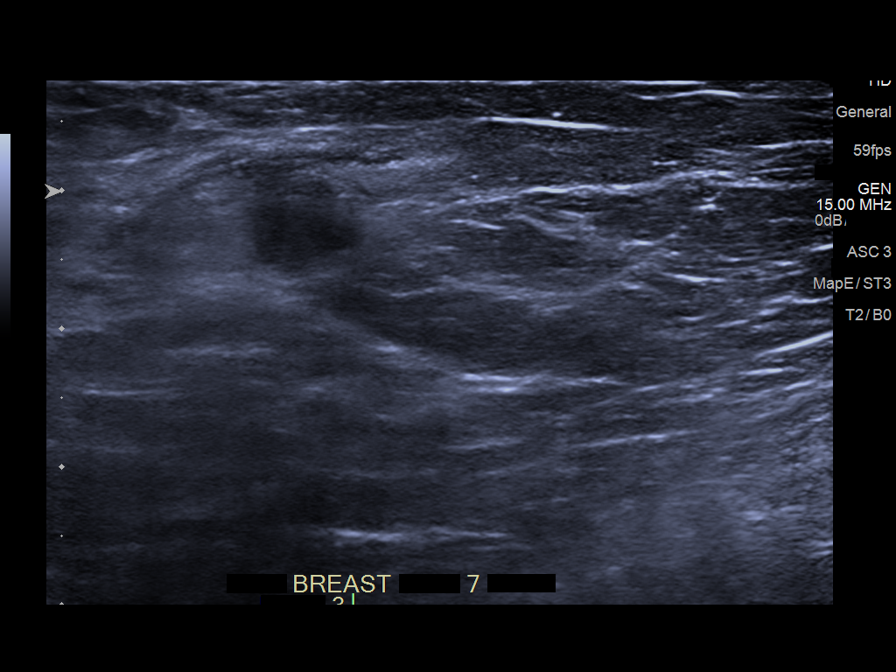
[im 10/10]
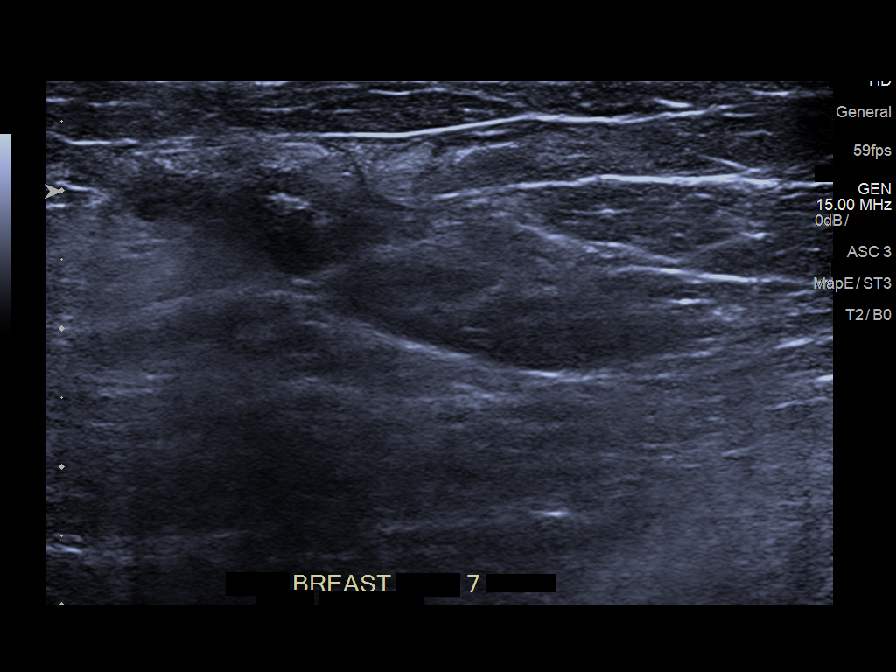

[8 of 8 positions shown; findings below may reference images not displayed]



Lesion quadrant: Upper inner quadrant

Using sterile technique and 1% Lidocaine as local anesthetic, under
direct ultrasound visualization, a 12 gauge [place] device was
used to perform biopsy of left breast mass 11 o'clock position using
a lateral approach. At the conclusion of the procedure a ribbon
shaped tissue marker clip was deployed into the biopsy cavity.
Follow up 2 view mammogram was performed and dictated separately.
IMPRESSION: Ultrasound guided biopsy of left breast mass 11 o'clock position. No
apparent complications.

ADDENDUM:
Pathology of the LEFT breast biopsy revealed A. BREAST, LEFT [DATE];
ULTRASOUND-GUIDED BIOPSY: INVASIVE MAMMARY CARCINOMA, NO SPECIAL
TYPE. Size of invasive carcinoma: 7 mm in this sample. Histologic
grade of invasive carcinoma: Grade 3. Ductal carcinoma in situ: Not
identified. Lymphovascular invasion: Not identified. ER/PR/HER2:
Immunohistochemistry will be performed on block A1, with reflex to
FISH for HER2 2+. The results will be reported in an addendum.

This was found to be concordant by Dr. Argenis Auerbach.

Recommendation: Surgical and oncology referrals.

At the patient's request, results and recommendations were relayed
to the patient and her Raghav Days on 04/14/2018. The patient
stated she did have bruising following the biopsy but no bleeding or
significant pain.

Post biopsy instructions were reviewed with the patient and all of
her and her daughter's questions were answered.

The patient requested that the surgical referral be made with Dr.
Dua.

Request for referrals was sent to the breast navigators for [REDACTED] by Bhebhe, Rantona on 04/14/2018.

Gab Tiger, RN, breast navigator spoke with the patient and
scheduled the referral with Dr. Dua for 04/20/2018 at [DATE].

The oncology appointment will be arranged and the patient will be
notified of the appointment.

The patient was encouraged to contact the [HOSPITAL] or
the nurse navigators with any further questions or concerns

Addendum by Bhebhe, Rantona on 04/18/2018.



Lesion quadrant: Upper inner quadrant

Using sterile technique and 1% Lidocaine as local anesthetic, under
direct ultrasound visualization, a 12 gauge [place] device was
used to perform biopsy of left breast mass 11 o'clock position using
a lateral approach. At the conclusion of the procedure a ribbon
shaped tissue marker clip was deployed into the biopsy cavity.
Follow up 2 view mammogram was performed and dictated separately.
IMPRESSION: Ultrasound guided biopsy of left breast mass 11 o'clock position. No
apparent complications.

## 2019-12-07 ENCOUNTER — Encounter: Payer: PPO | Admitting: Obstetrics & Gynecology

## 2019-12-07 DIAGNOSIS — S8992XA Unspecified injury of left lower leg, initial encounter: Secondary | ICD-10-CM | POA: Diagnosis not present

## 2019-12-07 DIAGNOSIS — S59902A Unspecified injury of left elbow, initial encounter: Secondary | ICD-10-CM | POA: Diagnosis not present

## 2019-12-07 DIAGNOSIS — M1712 Unilateral primary osteoarthritis, left knee: Secondary | ICD-10-CM | POA: Diagnosis not present

## 2019-12-07 DIAGNOSIS — W010XXA Fall on same level from slipping, tripping and stumbling without subsequent striking against object, initial encounter: Secondary | ICD-10-CM | POA: Diagnosis not present

## 2020-01-02 ENCOUNTER — Encounter: Payer: Self-pay | Admitting: Obstetrics & Gynecology

## 2020-01-02 ENCOUNTER — Ambulatory Visit (INDEPENDENT_AMBULATORY_CARE_PROVIDER_SITE_OTHER): Payer: PPO | Admitting: Obstetrics & Gynecology

## 2020-01-02 ENCOUNTER — Other Ambulatory Visit: Payer: Self-pay

## 2020-01-02 VITALS — BP 122/80 | Ht 65.0 in | Wt 246.0 lb

## 2020-01-02 DIAGNOSIS — E039 Hypothyroidism, unspecified: Secondary | ICD-10-CM | POA: Diagnosis not present

## 2020-01-02 DIAGNOSIS — R1031 Right lower quadrant pain: Secondary | ICD-10-CM | POA: Diagnosis not present

## 2020-01-02 DIAGNOSIS — E559 Vitamin D deficiency, unspecified: Secondary | ICD-10-CM | POA: Diagnosis not present

## 2020-01-02 DIAGNOSIS — R7309 Other abnormal glucose: Secondary | ICD-10-CM | POA: Diagnosis not present

## 2020-01-02 DIAGNOSIS — R198 Other specified symptoms and signs involving the digestive system and abdomen: Secondary | ICD-10-CM | POA: Diagnosis not present

## 2020-01-02 DIAGNOSIS — I10 Essential (primary) hypertension: Secondary | ICD-10-CM | POA: Diagnosis not present

## 2020-01-02 DIAGNOSIS — E7800 Pure hypercholesterolemia, unspecified: Secondary | ICD-10-CM | POA: Diagnosis not present

## 2020-01-02 DIAGNOSIS — Z79899 Other long term (current) drug therapy: Secondary | ICD-10-CM | POA: Diagnosis not present

## 2020-01-02 DIAGNOSIS — C50919 Malignant neoplasm of unspecified site of unspecified female breast: Secondary | ICD-10-CM | POA: Diagnosis not present

## 2020-01-02 NOTE — Progress Notes (Signed)
Gynecology Pelvic Pain Evaluation   Chief Complaint  Patient presents with  . Pelvic Pain    History of Present Illness:   Patient is a 69 y.o. [id number] who LMP was No LMP recorded. Patient has had a hysterectomy., presents today for a problem visit.  She complains of pain.   Pt reports 2-3 months h/o lower pelvic pressure and even right sided pain, no associated sx's.  Has had colonoscopy and uro evaluation.  Was tyreated for UTI 3 mos ago.  No VB or d/c.  Has prior TAH when age 54 and BSO when age 58.  Recent CT suggested right sided pelvic cyst or fluid collection.  "Reproductive: Status post hysterectomy. In the RIGHT adnexa, there is a cystic mass which measures 2.1 x 1.8 cm "  PMHx: She  has a past medical history of Abdominal pain, left lower quadrant (2012), Abnormal LFTs, Anemia, Arthritis, Asthma, Back pain (2005), Bone cancer ([place]), Breast cancer ([place]) (04/2018), Cancer ([place]) (04/26/2018), Chronic cholecystitis (2012), Chronic insomnia, Constipation (2005), COPD (chronic obstructive pulmonary disease) ([place]), Degenerative disc disease, cervical, Diabetes mellitus without complication ([place]), Diverticulitis, Endocrine problem (2005), Family history of bone cancer, Family history of brain cancer, Family history of colon cancer, Family history of lung cancer, GERD (gastroesophageal reflux disease) (2012), History of GI diverticular bleed, History of peptic ulcer disease, History of pneumonia, Hyperlipidemia (2012), Hypertension (2002), Nausea with vomiting (2012), Obesity, unspecified (2012), Personal history of chemotherapy, Personal history of radiation therapy (2020), Pneumonia, PONV (postoperative nausea and vomiting), Special screening for malignant neoplasms, colon (2012), and Vitamin D deficiency. Also,  has a past surgical history that includes Cholecystectomy (06/04/2010); Foot surgery (Bilateral, 2012); Colostomy (2004); Colostomy reversal (2004); Colon resection (2004);  Tonsillectomy; Hand surgery (Bilateral); Nasal sinus surgery; Bunionectomy (Bilateral, 2003); Colonoscopy (2005); Knee arthroscopy (Right, 02/03/2015); Salpingoophorectomy (1998); Tubal ligation (1978); Vaginal hysterectomy (1987); Bilateral carpal tunnel release (Bilateral); Knee Arthroplasty (Right, 04/20/2017); Colonoscopy (06/28/2012); Upper gi endoscopy (06/28/2012); Hernia repair (2005); Joint replacement; Breast lumpectomy (Left, 04/26/2018); Portacath placement (Right, 04/26/2018); Breast excisional biopsy (Left, early 90s); Breast biopsy (Left, 04/13/2018); Breast biopsy (Right, 04/10/2019); Breast biopsy (Left, 04/03/2019); Mastectomy (Left); status post colectomy; Colon surgery; and Colonoscopy with propofol (N/A, 11/09/2019)., family history includes Bone cancer (age of onset: 32) in her sister; Brain cancer (age of onset: 87) in her mother; Colon cancer in her maternal uncle; Diabetes in her paternal grandfather and paternal grandmother; Heart disease in her father; Leukemia in her cousin; Lung cancer in her cousin; Throat cancer in her cousin.,  reports that she has never smoked. She has never used smokeless tobacco. She reports previous alcohol use. She reports that she does not use drugs.  She has a current medication list which includes the following prescription(s): amlodipine, atorvastatin, gabapentin, hydrochlorothiazide, lansoprazole, levothyroxine, loratadine, losartan, meloxicam, potassium, zinc gluconate, and zolpidem. Also, is allergic to codeine, penicillins, and adhesive [tape].  Review of Systems  Constitutional: Negative for chills, fever and malaise/fatigue.  HENT: Negative for congestion, sinus pain and sore throat.   Eyes: Negative for blurred vision and pain.  Respiratory: Negative for cough and wheezing.   Cardiovascular: Negative for chest pain and leg swelling.  Gastrointestinal: Positive for abdominal pain. Negative for constipation, diarrhea, heartburn, nausea and vomiting.   Genitourinary: Negative for dysuria, frequency, hematuria and urgency.  Musculoskeletal: Negative for back pain, joint pain, myalgias and neck pain.  Skin: Negative for itching and rash.  Neurological: Negative for dizziness, tremors and weakness.  Endo/Heme/Allergies: Does not bruise/bleed easily.  Psychiatric/Behavioral: Negative for depression. The patient is not nervous/anxious and does not have insomnia.     Objective: BP 122/80   Ht 5\' 5"  (1.651 m)   Wt 246 lb (111.6 kg)   BMI 40.94 kg/m  Physical Exam Constitutional:      General: She is not in acute distress.    Appearance: She is well-developed.  Genitourinary:     Vagina and uterus normal.     No vaginal erythema or bleeding.      Right Adnexa: tender.    Right Adnexa: no mass present.    Left Adnexa: not tender and no mass present.    Adnexa exam comments: No mass palpated.     Cervix is absent.     Uterus is mobile.     No uterine mass detected.    Uterus is absent and midaxial.     Pelvic exam was performed with patient supine.  HENT:     Head: Normocephalic and atraumatic.     Nose: Nose normal.  Abdominal:     General: There is no distension.     Palpations: Abdomen is soft.     Tenderness: There is no abdominal tenderness.  Musculoskeletal:        General: Normal range of motion.  Neurological:     Mental Status: She is alert and oriented to person, place, and time.     Cranial Nerves: No cranial nerve deficit.  Skin:    General: Skin is warm and dry.  Psychiatric:        Attention and Perception: Attention normal.        Mood and Affect: Mood and affect normal.        Speech: Speech normal.        Behavior: Behavior normal.        Thought Content: Thought content normal.        Judgment: Judgment normal.     Female chaperone present for pelvic portion of the physical exam  Assessment: 69 y.o. [id number] with cystic fluid collection and pain.  1. Right pelvic adnexal fluid collection 2. Right  sided pelvic pain  TVUS to further charcaterize Unlikely ovarian origin based on history (unless remnant) No sign/symptom of significant vag or bladder prolapse to suggest pain either  Barnett Applebaum, MD, [place] 01/02/2020  4:21 PM

## 2020-01-02 NOTE — Addendum Note (Signed)
Addended by: Gae Dry on: 01/02/2020 04:27 PM   Modules accepted: Orders

## 2020-01-02 NOTE — Patient Instructions (Signed)
Transvaginal Ultrasound A transvaginal ultrasound, also called an endovaginal ultrasound, is a test that uses sound waves to take pictures of the female genital tract. The pictures are taken with a device, called a transducer, that is placed in the vagina. This test may be done to:  Check for problems with your pregnancy.  Check your developing baby.  Check for anything abnormal in the uterus or ovaries.  Find out why you have pelvic pain or bleeding. Tell a health care provider about:  Any allergies you have.  All medicines you are taking, including vitamins, herbs, eye drops, creams, and over-the-counter medicines.  Any blood disorders you have.  Any surgeries you have had.  Any medical conditions you have.  Whether you are pregnant or may be pregnant.  Whether you are having your menstrual period. What are the risks? This is a safe procedure. There are no known risks or complications of having this test. What happens before the procedure? This procedure needs to be done when your bladder is empty. Follow your health care provider's instructions about drinking fluids and emptying your bladder before the test. What happens during the procedure?   You will empty your bladder before the procedure.  You will undress from the waist down.  You will lie down on an exam table, with your knees bent and your feet in foot holders.  A health care provider will cover the transducer with a sterile cover.  A gel will be put on the transducer. The gel helps transmit the sound waves and prevents irritation of your vagina.  The technician will insert the transducer into your vagina to get images. These will be displayed on a monitor that looks like a small television screen.  The transducer will be removed when the procedure is complete. The procedure may vary among health care providers and hospitals. What happens after the procedure?  It is up to you to get the results of your  procedure. Ask your health care provider, or the department that is doing the procedure, when your results will be ready.  Keep all follow-up visits as told by your health care provider. This is important. Summary  A transvaginal ultrasound, also called an endovaginal ultrasound, is a test that uses sound waves to take pictures of the female genital tract.  This is a safe procedure. There are no known risks associated with this test.  The procedure needs to be done when your bladder is empty. Follow your health care provider's instructions about drinking fluids and emptying your bladder before the test.  During the procedure, you will undress from the waist down and lie down on an exam table. A technician will insert a transducer into your vagina to obtain images.  Ask your health care provider, or the department that is doing the procedure, when your results will be ready. This information is not intended to replace advice given to you by your health care provider. Make sure you discuss any questions you have with your health care provider. Document Revised: 08/17/2017 Document Reviewed: 08/17/2017 Elsevier Patient Education  [place].

## 2020-01-04 DIAGNOSIS — Z96651 Presence of right artificial knee joint: Secondary | ICD-10-CM | POA: Diagnosis not present

## 2020-01-04 DIAGNOSIS — M25562 Pain in left knee: Secondary | ICD-10-CM | POA: Diagnosis not present

## 2020-01-04 DIAGNOSIS — M1712 Unilateral primary osteoarthritis, left knee: Secondary | ICD-10-CM | POA: Diagnosis not present

## 2020-01-21 ENCOUNTER — Encounter: Payer: Self-pay | Admitting: Obstetrics & Gynecology

## 2020-01-21 ENCOUNTER — Ambulatory Visit (INDEPENDENT_AMBULATORY_CARE_PROVIDER_SITE_OTHER): Payer: PPO

## 2020-01-21 ENCOUNTER — Other Ambulatory Visit: Payer: Self-pay

## 2020-01-21 ENCOUNTER — Ambulatory Visit (INDEPENDENT_AMBULATORY_CARE_PROVIDER_SITE_OTHER): Payer: PPO | Admitting: Obstetrics & Gynecology

## 2020-01-21 VITALS — BP 140/70 | Ht 65.0 in | Wt 248.0 lb

## 2020-01-21 DIAGNOSIS — R1031 Right lower quadrant pain: Secondary | ICD-10-CM

## 2020-01-21 DIAGNOSIS — N83201 Unspecified ovarian cyst, right side: Secondary | ICD-10-CM | POA: Diagnosis not present

## 2020-01-21 DIAGNOSIS — R198 Other specified symptoms and signs involving the digestive system and abdomen: Secondary | ICD-10-CM | POA: Diagnosis not present

## 2020-01-21 NOTE — Progress Notes (Signed)
HPI: Pt has mild chronic RLQ pain, and has been seen by GI and Uro.  CT for toher reasons found cyst on right ovary.  Pt has h/o hysterectomy as well as later BSO.  No VB, bloating, weight changes, n/v/d.  Ultrasound demonstrates 2 cm right ovarian (?) cyst  PMHx: She  has a past medical history of Abdominal pain, left lower quadrant (2012), Abnormal LFTs, Anemia, Arthritis, Asthma, Back pain (2005), Bone cancer (HCC), Breast cancer (HCC) (04/2018), Cancer (HCC) (04/26/2018), Chronic cholecystitis (2012), Chronic insomnia, Constipation (2005), COPD (chronic obstructive pulmonary disease) (HCC), Degenerative disc disease, cervical, Diabetes mellitus without complication (HCC), Diverticulitis, Endocrine problem (2005), Family history of bone cancer, Family history of brain cancer, Family history of colon cancer, Family history of lung cancer, GERD (gastroesophageal reflux disease) (2012), History of GI diverticular bleed, History of peptic ulcer disease, History of pneumonia, Hyperlipidemia (2012), Hypertension (2002), Nausea with vomiting (2012), Obesity, unspecified (2012), Personal history of chemotherapy, Personal history of radiation therapy (2020), Pneumonia, PONV (postoperative nausea and vomiting), Special screening for malignant neoplasms, colon (2012), and Vitamin D deficiency. Also,  has a past surgical history that includes Cholecystectomy (06/04/2010); Foot surgery (Bilateral, 2012); Colostomy (2004); Colostomy reversal (2004); Colon resection (2004); Tonsillectomy; Hand surgery (Bilateral); Nasal sinus surgery; Bunionectomy (Bilateral, 2003); Colonoscopy (2005); Knee arthroscopy (Right, 02/03/2015); Salpingoophorectomy (1998); Tubal ligation (1978); Vaginal hysterectomy (1987); Bilateral carpal tunnel release (Bilateral); Knee Arthroplasty (Right, 04/20/2017); Colonoscopy (06/28/2012); Upper gi endoscopy (06/28/2012); Hernia repair (2005); Joint replacement; Breast lumpectomy (Left, 04/26/2018);  Portacath placement (Right, 04/26/2018); Breast excisional biopsy (Left, early 90s); Breast biopsy (Left, 04/13/2018); Breast biopsy (Right, 04/10/2019); Breast biopsy (Left, 04/03/2019); Mastectomy (Left); status post colectomy; Colon surgery; and Colonoscopy with propofol (N/A, 11/09/2019)., family history includes Bone cancer (age of onset: 25) in her sister; Brain cancer (age of onset: 27) in her mother; Colon cancer in her maternal uncle; Diabetes in her paternal grandfather and paternal grandmother; Heart disease in her father; Leukemia in her cousin; Lung cancer in her cousin; Throat cancer in her cousin.,  reports that she has never smoked. She has never used smokeless tobacco. She reports previous alcohol use. She reports that she does not use drugs.  She has a current medication list which includes the following prescription(s): amlodipine, atorvastatin, gabapentin, hydrochlorothiazide, lansoprazole, levothyroxine, loratadine, losartan, meloxicam, potassium, zinc gluconate, and zolpidem. Also, is allergic to codeine, penicillins, and adhesive [tape].  Review of Systems  All other systems reviewed and are negative.   Objective: BP 140/70   Ht 5\' 5"  (1.651 m)   Wt 248 lb (112.5 kg)   BMI 41.27 kg/m   Physical examination Constitutional NAD, Conversant  Skin No rashes, lesions or ulceration.   Extremities: Moves all appropriately.  Normal ROM for age. No lymphadenopathy.  Neuro: Grossly intact  Psych: Oriented to PPT.  Normal mood. Normal affect.   PELVIC COMPLETE WITH TRANSVAGINAL  Result Date: 01/21/2020 Patient Name: Megan Harris DOB: May 15, 1950 MRN: [id number] ULTRASOUND REPORT Location: [facility] Date of Service: 01/21/2020 Indications:Pelvic Pain Findings:  The uterus and ovaries have been removed. There is a simple cyst in the right ovary measuring 20.7 x 13.3 x 19.3 mm. No blood flow is seen within this cyst. Normal midline pelvis and left lower quadrant. There is no free  fluid in the cul de sac. Impression: 1. There is a small, simple cyst in the right adnexa. 2. Total hysterectomy Recommendations: 1.Clinical correlation with the patient's History and Physical Exam. 03/20/2020, RT Review of ULTRASOUND.  I have personally reviewed images and report of recent ultrasound done at [facility].    Plan of management to be discussed with patient. Barnett Applebaum, MD, [facility], [facility] 01/21/2020  4:20 PM   Assessment:  Cyst of right ovary RLQ abdominal pain  - Plan: Ovarian Malignancy Risk-ROMA - If neg, then monitor for changes in sx's.  [place] one year follow up - Unlikely source of pain, based on size and proximity, prior BSO so is this peritoneal inclusion cyst ot other(?) - Surgery risks and benefits, question of pain relief from this cyst if its the true cause or not, discussed  A total of 20 minutes were spent face-to-face with the patient as well as preparation, review, communication, and documentation during this encounter.   Barnett Applebaum, MD, [facility], [facility] 01/21/2020  4:21 PM

## 2020-01-22 LAB — OVARIAN MALIGNANCY RISK-ROMA
Cancer Antigen (CA) 125: 11.5 U/mL (ref 0.0–38.1)
HE4: 54 pmol/L (ref 0.0–96.9)
Postmenopausal ROMA: 1.04
Premenopausal ROMA: 0.87

## 2020-01-22 LAB — POSTMENOPAUSAL INTERP: LOW

## 2020-01-22 LAB — PREMENOPAUSAL INTERP: LOW

## 2020-01-23 ENCOUNTER — Telehealth: Payer: Self-pay | Admitting: Obstetrics & Gynecology

## 2020-01-23 NOTE — Telephone Encounter (Signed)
Per [facility] cancel appointment

## 2020-01-23 NOTE — Progress Notes (Signed)
Cancel appt next week, plan for follow up w GYN [place] in Dec 2022  Results of labwork d/w pt.

## 2020-01-23 NOTE — Telephone Encounter (Signed)
-----   Message from Nadara Mustard, MD sent at 01/23/2020  7:45 AM EST ----- Cancel appt next week, plan for follow up w GYN [place] in Dec 2022  Results of labwork d/w pt.

## 2020-01-29 DIAGNOSIS — D2261 Melanocytic nevi of right upper limb, including shoulder: Secondary | ICD-10-CM | POA: Diagnosis not present

## 2020-01-29 DIAGNOSIS — D2271 Melanocytic nevi of right lower limb, including hip: Secondary | ICD-10-CM | POA: Diagnosis not present

## 2020-01-29 DIAGNOSIS — D225 Melanocytic nevi of trunk: Secondary | ICD-10-CM | POA: Diagnosis not present

## 2020-01-29 DIAGNOSIS — D2272 Melanocytic nevi of left lower limb, including hip: Secondary | ICD-10-CM | POA: Diagnosis not present

## 2020-01-29 DIAGNOSIS — L814 Other melanin hyperpigmentation: Secondary | ICD-10-CM | POA: Diagnosis not present

## 2020-01-29 DIAGNOSIS — D2262 Melanocytic nevi of left upper limb, including shoulder: Secondary | ICD-10-CM | POA: Diagnosis not present

## 2020-01-29 DIAGNOSIS — L821 Other seborrheic keratosis: Secondary | ICD-10-CM | POA: Diagnosis not present

## 2020-02-01 ENCOUNTER — Ambulatory Visit: Payer: PPO | Admitting: Obstetrics & Gynecology

## 2020-02-29 ENCOUNTER — Other Ambulatory Visit: Payer: Self-pay | Admitting: General Surgery

## 2020-02-29 DIAGNOSIS — Z171 Estrogen receptor negative status [ER-]: Secondary | ICD-10-CM

## 2020-03-04 ENCOUNTER — Ambulatory Visit: Payer: Self-pay | Admitting: Urology

## 2020-03-10 ENCOUNTER — Telehealth: Payer: Self-pay

## 2020-03-10 NOTE — Telephone Encounter (Signed)
Pt calling; has recv'd letter of denial from ins for a test that [facility] ordered.  Pt was referred to [facility] by Hervey Ard; no ref authorization on file; maybe coded wrong; can we get that corrected, please?  [phone]  Pt states the test that wasn't covered is th Oncology Ovarian Assay CA125 and HE4.  Pt tried calling our billing dept [phone] and said it rang and rang for 97minutes so she hung up and called here.

## 2020-03-12 ENCOUNTER — Inpatient Hospital Stay: Payer: PPO | Attending: Oncology

## 2020-03-12 ENCOUNTER — Encounter: Payer: Self-pay | Admitting: Oncology

## 2020-03-12 ENCOUNTER — Inpatient Hospital Stay: Payer: PPO | Admitting: Oncology

## 2020-03-12 ENCOUNTER — Other Ambulatory Visit: Payer: Self-pay

## 2020-03-12 VITALS — BP 144/85 | HR 72 | Temp 98.0°F | Resp 18 | Wt 250.4 lb

## 2020-03-12 DIAGNOSIS — K760 Fatty (change of) liver, not elsewhere classified: Secondary | ICD-10-CM | POA: Insufficient documentation

## 2020-03-12 DIAGNOSIS — C50912 Malignant neoplasm of unspecified site of left female breast: Secondary | ICD-10-CM | POA: Insufficient documentation

## 2020-03-12 DIAGNOSIS — C50919 Malignant neoplasm of unspecified site of unspecified female breast: Secondary | ICD-10-CM | POA: Diagnosis not present

## 2020-03-12 DIAGNOSIS — G629 Polyneuropathy, unspecified: Secondary | ICD-10-CM | POA: Insufficient documentation

## 2020-03-12 DIAGNOSIS — Z923 Personal history of irradiation: Secondary | ICD-10-CM | POA: Diagnosis not present

## 2020-03-12 DIAGNOSIS — Z171 Estrogen receptor negative status [ER-]: Secondary | ICD-10-CM | POA: Diagnosis not present

## 2020-03-12 DIAGNOSIS — Z9221 Personal history of antineoplastic chemotherapy: Secondary | ICD-10-CM | POA: Insufficient documentation

## 2020-03-12 DIAGNOSIS — R7401 Elevation of levels of liver transaminase levels: Secondary | ICD-10-CM

## 2020-03-12 LAB — COMPREHENSIVE METABOLIC PANEL
ALT: 64 U/L — ABNORMAL HIGH (ref 0–44)
AST: 51 U/L — ABNORMAL HIGH (ref 15–41)
Albumin: 3.9 g/dL (ref 3.5–5.0)
Alkaline Phosphatase: 80 U/L (ref 38–126)
Anion gap: 10 (ref 5–15)
BUN: 15 mg/dL (ref 8–23)
CO2: 29 mmol/L (ref 22–32)
Calcium: 9.1 mg/dL (ref 8.9–10.3)
Chloride: 98 mmol/L (ref 98–111)
Creatinine, Ser: 0.76 mg/dL (ref 0.44–1.00)
GFR, Estimated: >60 mL/min (ref >60)
Glucose, Bld: 238 mg/dL — ABNORMAL HIGH (ref 70–99)
Potassium: 4.4 mmol/L (ref 3.5–5.1)
Sodium: 137 mmol/L (ref 135–145)
Total Bilirubin: 0.8 mg/dL (ref 0.3–1.2)
Total Protein: 6.9 g/dL (ref 6.5–8.1)

## 2020-03-12 LAB — CBC WITH DIFFERENTIAL/PLATELET
Abs Immature Granulocytes: 0.01 10*3/uL (ref 0.00–0.07)
Basophils Absolute: 0 10*3/uL (ref 0.0–0.1)
Basophils Relative: 1 %
Eosinophils Absolute: 0.1 10*3/uL (ref 0.0–0.5)
Eosinophils Relative: 2 %
HCT: 41.5 % (ref 36.0–46.0)
Hemoglobin: 13.6 g/dL (ref 12.0–15.0)
Immature Granulocytes: 0 %
Lymphocytes Relative: 32 %
Lymphs Abs: 1.7 10*3/uL (ref 0.7–4.0)
MCH: 29.4 pg (ref 26.0–34.0)
MCHC: 32.8 g/dL (ref 30.0–36.0)
MCV: 89.8 fL (ref 80.0–100.0)
Monocytes Absolute: 0.4 10*3/uL (ref 0.1–1.0)
Monocytes Relative: 7 %
Neutro Abs: 3.2 10*3/uL (ref 1.7–7.7)
Neutrophils Relative %: 58 %
Platelets: 169 10*3/uL (ref 150–400)
RBC: 4.62 MIL/uL (ref 3.87–5.11)
RDW: 13.5 % (ref 11.5–15.5)
WBC: 5.5 10*3/uL (ref 4.0–10.5)
nRBC: 0 % (ref 0.0–0.2)

## 2020-03-12 NOTE — Progress Notes (Signed)
Pt here for follow up. No new concerns voiced.   

## 2020-03-12 NOTE — Progress Notes (Signed)
Hematology/Oncology progress note [facility] [phone] [phone]   Patient Care Team: Idelle Crouch, MD as PCP - General (Internal Medicine) Bary Castilla, Forest Gleason, MD as Consulting Physician (General Surgery) Theodore Demark, RN as Oncology Nurse Navigator Noreene Filbert, MD as Referring Physician (Radiation Oncology) Earlie Server, MD as Consulting Physician (Oncology)  REFERRING PROVIDER: Idelle Crouch, MD REASON FOR VISIT:  Follow up for adjuvant chemotherapy for breast cancer.   HISTORY OF PRESENTING ILLNESS:  Megan Harris is a  70 y.o.  female present for management of  breast cancer  # Stage IB left triple negative breast cancer   03/27/2018 which showed left breast mass. She had diagnostic and ultrasound on 04/07/2018 which showed 0.9cm x 0.9cm x 1.4cm irregular border mixed echotexture mass at the left breast 11 o'clock 7 cm from nipple correlating to the mammographic finding. Ultrasound of the left axilla is negative She underwent biopsy of left breast mass. 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 Estrogen receptor negative, progesterone receptor negative, HER-2 negative.  S/p left lumpectomy and sentinel lymph node biopsy. pT1pN0 05/09/2018 Baseline MUGA showed no focal wall motion abnormality of the left ventricle.  Calculated LVEF 61%  # 05/24/2018- 07/05/2018 Adjuvant Chemotherapy ddAC- # 07/19/2018 started on weekly Taxol 80 mg/m. Starting cycle 4, Taxol was decreased to 56m/m2 # 08/23/2018 Cycle cycle 6, Taxol was decreased to 65 mg/m due to neuropathy. 10/04/2018 patient finished adjuvant chemotherapy with ddAC x 4 followed by 12 weekly Taxol. 12/13/2018, she finished adjuvant radiation.  #Patient was recommended for genetic testing and she declined. Mediport was discontinued per patient's preference  # 03/29/2019 patient  underwent annual diagnostic breast mammogram bilaterally. Mammogram showed new calcifications in the superior lateral right breast spanning 5 mm. Also there is an abnormal lymph node in the low left axilla representing a change from previous mammogram. 04/03/2019 patient underwent left axillary lymph node biopsy and pathology is negative for malignancy, fibroadipose tissue with necrosis and calcifications, compatible with prior radiation therapy.  Lymph node tissue is not identified. 04/10/2019 breast right upper outer quadrant biopsy showed fibroadenomatoid changes with associated calcifications.  Negative for atypia and malignancy.  Seen by Dr. BBary Castillarecently.  10/23/2019 CT abdomen pelvis was obtained for evaluation of pelvic pain.CT showed mild circumferential bladder wall thickening, she follows with urologist and there is a plan for cystoscopy per patient.  CT also showed hepatic steatosis, 2.1 cm right adnexal cystic mass.  # 11/09/2019, colonoscopy was performed by Dr. BBary Castillawhich showed diverticulosis in the rectosigmoid colon.  2 colon polyps was resected and retrieved.    [name] a 70y.o. female who has above history reviewed by me presents for follow-up of stage I left triple negative breast cancer Pelvic pain has improved.  01/21/2020 ultrasound pelvic showed small simple cyst in the right adnexa.  Patient was seen by Dr. HKenton Kingfishergynecology Urinary urgency has also improved.  She was seen by urology last year. She has chronic intermittent sharp pain no other new breast concerns.  Review of Systems  Constitutional: Negative for chills and fever.  HENT: Negative for hearing loss.   Respiratory: Negative for cough.   Cardiovascular: Negative for chest pain.  Gastrointestinal: Negative for abdominal pain, blood in stool and nausea.  Genitourinary: Negative for frequency and urgency.  Skin: Negative for itching and rash.  Neurological: Negative for focal  weakness.  Psychiatric/Behavioral:  The patient is not nervous/anxious.     MEDICAL HISTORY:  Past Medical History:  Diagnosis Date  . Abdominal pain, left lower quadrant 2012  . Abnormal LFTs   . Anemia    vitamin d deficiency  . Arthritis   . Asthma   . Back pain 2005   lower back arthritis  . Bone cancer ([place])   . Breast cancer ([place]) 04/2018   left invasive mammary  . Cancer ([place]) 04/26/2018   107m, T1c, N0 triple negative  . Chronic cholecystitis 2012   has had choleycystectomy.  not an issue  . Chronic insomnia   . Constipation 2005  . COPD (chronic obstructive pulmonary disease) ([place]   . Degenerative disc disease, cervical   . Diabetes mellitus without complication ([place]   . Diverticulitis   . Endocrine problem 2005  . Family history of bone cancer   . Family history of brain cancer   . Family history of colon cancer   . Family history of lung cancer   . GERD (gastroesophageal reflux disease) 2012  . History of GI diverticular bleed   . History of peptic ulcer disease   . History of pneumonia   . Hyperlipidemia 2012  . Hypertension 2002  . Nausea with vomiting 2012  . Obesity, unspecified 2012  . Personal history of chemotherapy   . Personal history of radiation therapy 2020   invasive mammary  . Pneumonia   . PONV (postoperative nausea and vomiting)   . Special screening for malignant neoplasms, colon 2012  . Vitamin D deficiency     SURGICAL HISTORY: Past Surgical History:  Procedure Laterality Date  . BILATERAL CARPAL TUNNEL RELEASE Bilateral   . BREAST BIOPSY Left 04/13/2018   invasive mamm and DCIS  . BREAST BIOPSY Right 04/10/2019   calcs bx, ribbon marker, path pending  . BREAST BIOPSY Left 04/03/2019   LN bx with Dr BBary Castilla FEATURES COMPATIBLE WITH PRIOR RADIATION THERAPY  . BREAST EXCISIONAL BIOPSY Left early 90s   neg  . BREAST LUMPECTOMY Left 04/26/2018   Procedure: BREAST LUMPECTOMY WITH EXCISION OF SENTINEL NODE;  Surgeon: BRobert Bellow MD;  Location: [facility] ORS;  Service: General;  Laterality: Left;  . BUNIONECTOMY Bilateral 2003  . CHOLECYSTECTOMY  06/04/2010  . COLON RESECTION  2004   due diverticulitis   . COLON SURGERY    . [place]  [place]. BBary Castilla . COLONOSCOPY  06/28/2012  . COLONOSCOPY WITH PROPOFOL N/A 11/09/2019   Procedure: COLONOSCOPY WITH PROPOFOL;  Surgeon: BRobert Bellow MD;  Location: [facility] ENDOSCOPY;  Service: Endoscopy;  Laterality: N/A;  . COLOSTOMY  2004  . COLOSTOMY REVERSAL  2004  . FOOT SURGERY Bilateral 2012   plantar faciatis  . HAND SURGERY Bilateral    carpal tunnel  . HERNIA REPAIR  2005   at colostomy site after reversal done  . JOINT REPLACEMENT    . KNEE ARTHROPLASTY Right 04/20/2017   Procedure: COMPUTER ASSISTED TOTAL KNEE ARTHROPLASTY;  Surgeon: HDereck Leep MD;  Location: [facility] ORS;  Service: Orthopedics;  Laterality: Right;  . KNEE ARTHROSCOPY Right 02/03/2015   Procedure: ARTHROSCOPY right knee, partial medial menisectomy, condyle malleolus, patella and femoral;  Surgeon: JDereck Leep MD;  Location: [facility] ORS;  Service: Orthopedics;  Laterality: Right;  . MASTECTOMY Left    partial lumpectomy'  . NASAL SINUS SURGERY     2005  . PORTACATH PLACEMENT Right 04/26/2018   Procedure: INSERTION PORT-A-CATH RIGHT;  Surgeon: BBary Castilla  Forest Gleason, MD;  Location: [facility] ORS;  Service: General;  Laterality: Right;  . SALPINGOOPHORECTOMY  1998  . status post colectomy    . TONSILLECTOMY    . TUBAL LIGATION  1978  . UPPER GI ENDOSCOPY  06/28/2012  . VAGINAL HYSTERECTOMY  1987    SOCIAL HISTORY: Social History   Socioeconomic History  . Marital status: Married    Spouse name: Francee Piccolo  . Number of children: 2  . Years of education: Not on file  . Highest education level: Not on file  Occupational History  . Occupation: worked at [employer] in [place]: retired  Tobacco Use  . Smoking status: Never Smoker  . Smokeless tobacco: Never Used  Vaping Use  .  Vaping Use: Never used  Substance and Sexual Activity  . Alcohol use: Not Currently  . Drug use: No  . Sexual activity: Yes    Birth control/protection: Surgical  Other Topics Concern  . Not on file  Social History Narrative  . Not on file   Social Determinants of Health   Financial Resource Strain: Not on file  Food Insecurity: Not on file  Transportation Needs: Not on file  Physical Activity: Not on file  Stress: Not on file  Social Connections: Not on file  Intimate Partner Violence: Not on file   Lives at home with husband.  FAMILY HISTORY: Family History  Problem Relation Age of Onset  . Heart disease Father   . Diabetes Paternal Grandmother   . Diabetes Paternal Grandfather   . Brain cancer Mother 16  . Bone cancer Sister 8  . Colon cancer Maternal Uncle        dx 50s-60s  . Throat cancer Cousin   . Leukemia Cousin   . Lung cancer Cousin   . Ovarian cancer Neg Hx   . Breast cancer Neg Hx   . Prostate cancer Neg Hx   . Kidney cancer Neg Hx   . Bladder Cancer Neg Hx     ALLERGIES:  is allergic to codeine, penicillins, and adhesive [tape].  MEDICATIONS:  Current Outpatient Medications  Medication Sig Dispense Refill  . amLODipine (NORVASC) 5 MG tablet Take 5 mg by mouth daily.    Marland Kitchen atorvastatin (LIPITOR) 10 MG tablet Take 10 mg by mouth daily.    Marland Kitchen gabapentin (NEURONTIN) 300 MG capsule TAKE 2 CAPSULES BY MOUTH 3 TIMES DAILY (Patient taking differently: 2 (two) times daily.) 180 capsule 0  . hydrochlorothiazide (HYDRODIURIL) 25 MG tablet Take 25 mg by mouth daily.    . lansoprazole (PREVACID) 30 MG capsule Take 30 mg by mouth 2 (two) times daily.     Marland Kitchen levothyroxine (SYNTHROID, LEVOTHROID) 50 MCG tablet Take 50 mcg by mouth daily before breakfast.     . loratadine (CLARITIN) 10 MG tablet Take 10 mg by mouth daily.    Marland Kitchen losartan (COZAAR) 100 MG tablet Take 100 mg by mouth daily.    . meloxicam (MOBIC) 15 MG tablet Take by mouth.    . Potassium 99 MG TABS  Take by mouth daily.    Marland Kitchen zinc gluconate 50 MG tablet Take 50 mg by mouth daily.    Marland Kitchen zolpidem (AMBIEN) 10 MG tablet Take 10 mg by mouth at bedtime.     No current facility-administered medications for this visit.     PHYSICAL EXAMINATION: ECOG PERFORMANCE STATUS: 1 - Symptomatic but completely ambulatory Vitals:   03/12/20 1312  BP: (!) 144/85  Pulse: 72  Resp: 18  Temp: 98 F (36.7 C)   There were no vitals filed for this visit.  Physical Exam Constitutional:      General: She is not in acute distress.    Appearance: She is obese.  HENT:     Head: Normocephalic and atraumatic.  Eyes:     General: No scleral icterus.    Pupils: Pupils are equal, round, and reactive to light.  Cardiovascular:     Rate and Rhythm: Normal rate and regular rhythm.     Heart sounds: Normal heart sounds.  Pulmonary:     Effort: Pulmonary effort is normal. No respiratory distress.     Breath sounds: No wheezing.  Abdominal:     General: Bowel sounds are normal. There is no distension.     Palpations: Abdomen is soft. There is no mass.     Tenderness: There is no abdominal tenderness.  Musculoskeletal:        General: No deformity. Normal range of motion.     Cervical back: Normal range of motion and neck supple.  Skin:    General: Skin is warm and dry.     Findings: No erythema or rash.  Neurological:     Mental Status: She is alert and oriented to person, place, and time. Mental status is at baseline.     Cranial Nerves: No cranial nerve deficit.     Coordination: Coordination normal.  Psychiatric:        Mood and Affect: Mood normal.   Left breast was examined.  No palpable mass.  Mild tenderness and focal tissue thickening at the lumpectomy site.  LABORATORY DATA:  I have reviewed the data as listed Lab Results  Component Value Date   WBC 5.3 11/12/2019   HGB 13.3 11/12/2019   HCT 39.7 11/12/2019   MCV 87.8 11/12/2019   PLT 164 11/12/2019   Recent Labs    04/03/19 0959  07/13/19 0921 10/22/19 1115 11/12/19 1009  NA 138 139 138 136  K 3.6 3.8 3.5 3.2*  CL 99 100 99 98  CO2 28 28 30 26   GLUCOSE 159* 161* 156* 169*  BUN 17 15 13 18   CREATININE 0.66 0.69 0.73 0.74  CALCIUM 9.3 8.9 9.4 8.7*  GFRNONAA >60 >60 >60 >60  GFRAA >60 >60 >60  --   PROT 7.1 7.0  --  7.0  ALBUMIN 4.0 3.9  --  3.9  AST 57* 62*  --  40  ALT 72* 72*  --  57*  ALKPHOS 85 82  --  83  BILITOT 0.8 0.9  --  0.9   Iron/TIBC/Ferritin/ %Sat No results found for: IRON, TIBC, FERRITIN, IRONPCTSAT   RADIOGRAPHIC STUDIES: I have personally reviewed the radiological images as listed and agreed with the findings in the report. US PELVIC COMPLETE WITH TRANSVAGINAL  Result Date: 01/21/2020 Patient Name: INGRID SHIFRIN DOB: [birth year] MRN: [id number] ULTRASOUND REPORT Location: [facility] Date of Service: 01/21/2020 Indications:Pelvic Pain Findings:  The uterus and ovaries have been removed. There is a simple cyst in the right ovary measuring 20.7 x 13.3 x 19.3 mm. No blood flow is seen within this cyst. Normal midline pelvis and left lower quadrant. There is no free fluid in the cul de sac. Impression: 1. There is a small, simple cyst in the right adnexa. 2. Total hysterectomy Recommendations: 1.Clinical correlation with the patient's History and Physical Exam. Gweneth Dimitri, RT Review of ULTRASOUND.    I have personally reviewed images and  report of recent ultrasound done at [facility]    Plan of management to be discussed with patient. Barnett Applebaum, MD, [facility], [facility] 01/21/2020  4:20 PM     ASSESSMENT & PLAN:  1. 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 ([place])   2. Neuropathy   3. Transaminitis   Cancer Staging 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 ([place]) Staging form: Breast, AJCC 8th Edition - Clinical stage from 04/28/2018: Stage IB (cT1c, cN0, cM0, G3, ER-, PR-, HER2-) - Signed by Earlie Server, MD on 04/29/2018 - Pathologic stage from 05/25/2018:  Stage IB (pT1c, pN0(sn), cM0, G3, ER-, PR-, HER2-) - Signed by Earlie Server, MD on 05/25/2018   # Stage IB left triple negative breast cancer S/p adjuvant chemotherapy.  Status post adjuvant radiation. Labs are reviewed and discussed with patient. Patient is doing very well clinically. Recommend patient to continue take calcium and vitamin D supplementation She has mammogram scheduled in March through Dr. Dwyane Luo office. #transaminitis due to fatty liver disease.  Stable.  #Pre-existing neuropathy, exacerbated by chemotherapy, continue gabapentin.  Symptoms are slightly worse currently.  Recommend patient to increase gabapentin to 300 mg 3 times a day.  Patient also plans to continue acupuncture sessions.  #Follow-up  4 months. . All questions were answered. The patient knows to call the clinic with any problems questions or concerns.  Earlie Server, MD, PhD 03/12/2020

## 2020-04-01 ENCOUNTER — Other Ambulatory Visit: Payer: PPO

## 2020-04-04 ENCOUNTER — Ambulatory Visit
Admission: RE | Admit: 2020-04-04 | Discharge: 2020-04-04 | Disposition: A | Discharge status: Home / Self Care | Payer: PPO | Source: Ambulatory Visit | Attending: General Surgery | Admitting: General Surgery

## 2020-04-04 ENCOUNTER — Other Ambulatory Visit: Payer: Self-pay

## 2020-04-04 DIAGNOSIS — Z171 Estrogen receptor negative status [ER-]: Secondary | ICD-10-CM | POA: Diagnosis not present

## 2020-04-04 DIAGNOSIS — Z853 Personal history of malignant neoplasm of breast: Secondary | ICD-10-CM | POA: Diagnosis not present

## 2020-04-04 DIAGNOSIS — R922 Inconclusive mammogram: Secondary | ICD-10-CM | POA: Diagnosis not present

## 2020-04-04 DIAGNOSIS — C50212 Malignant neoplasm of upper-inner quadrant of left female breast: Secondary | ICD-10-CM | POA: Diagnosis not present

## 2020-04-08 DIAGNOSIS — C50212 Malignant neoplasm of upper-inner quadrant of left female breast: Secondary | ICD-10-CM | POA: Diagnosis not present

## 2020-04-16 DIAGNOSIS — R7309 Other abnormal glucose: Secondary | ICD-10-CM | POA: Diagnosis not present

## 2020-04-16 DIAGNOSIS — E039 Hypothyroidism, unspecified: Secondary | ICD-10-CM | POA: Diagnosis not present

## 2020-04-16 DIAGNOSIS — Z79899 Other long term (current) drug therapy: Secondary | ICD-10-CM | POA: Diagnosis not present

## 2020-04-23 DIAGNOSIS — I10 Essential (primary) hypertension: Secondary | ICD-10-CM | POA: Diagnosis not present

## 2020-04-23 DIAGNOSIS — C50919 Malignant neoplasm of unspecified site of unspecified female breast: Secondary | ICD-10-CM | POA: Diagnosis not present

## 2020-04-23 DIAGNOSIS — R7309 Other abnormal glucose: Secondary | ICD-10-CM | POA: Diagnosis not present

## 2020-04-23 DIAGNOSIS — E039 Hypothyroidism, unspecified: Secondary | ICD-10-CM | POA: Diagnosis not present

## 2020-04-23 DIAGNOSIS — E559 Vitamin D deficiency, unspecified: Secondary | ICD-10-CM | POA: Diagnosis not present

## 2020-04-23 DIAGNOSIS — Z0000 Encounter for general adult medical examination without abnormal findings: Secondary | ICD-10-CM | POA: Diagnosis not present

## 2020-04-23 DIAGNOSIS — Z79899 Other long term (current) drug therapy: Secondary | ICD-10-CM | POA: Diagnosis not present

## 2020-04-23 DIAGNOSIS — E7800 Pure hypercholesterolemia, unspecified: Secondary | ICD-10-CM | POA: Diagnosis not present

## 2020-05-31 DIAGNOSIS — H6123 Impacted cerumen, bilateral: Secondary | ICD-10-CM | POA: Diagnosis not present

## 2020-05-31 DIAGNOSIS — B370 Candidal stomatitis: Secondary | ICD-10-CM | POA: Diagnosis not present

## 2020-05-31 DIAGNOSIS — J029 Acute pharyngitis, unspecified: Secondary | ICD-10-CM | POA: Diagnosis not present

## 2020-05-31 DIAGNOSIS — Z20822 Contact with and (suspected) exposure to covid-19: Secondary | ICD-10-CM | POA: Diagnosis not present

## 2020-06-03 DIAGNOSIS — H6123 Impacted cerumen, bilateral: Secondary | ICD-10-CM | POA: Diagnosis not present

## 2020-06-03 DIAGNOSIS — J40 Bronchitis, not specified as acute or chronic: Secondary | ICD-10-CM | POA: Diagnosis not present

## 2020-07-08 ENCOUNTER — Inpatient Hospital Stay: Payer: PPO | Attending: Oncology

## 2020-07-08 DIAGNOSIS — C50212 Malignant neoplasm of upper-inner quadrant of left female breast: Secondary | ICD-10-CM | POA: Insufficient documentation

## 2020-07-08 DIAGNOSIS — G620 Drug-induced polyneuropathy: Secondary | ICD-10-CM | POA: Insufficient documentation

## 2020-07-08 DIAGNOSIS — Z9221 Personal history of antineoplastic chemotherapy: Secondary | ICD-10-CM | POA: Insufficient documentation

## 2020-07-08 DIAGNOSIS — K760 Fatty (change of) liver, not elsewhere classified: Secondary | ICD-10-CM | POA: Insufficient documentation

## 2020-07-08 DIAGNOSIS — R7401 Elevation of levels of liver transaminase levels: Secondary | ICD-10-CM | POA: Diagnosis not present

## 2020-07-08 DIAGNOSIS — Z923 Personal history of irradiation: Secondary | ICD-10-CM | POA: Diagnosis not present

## 2020-07-08 DIAGNOSIS — C50919 Malignant neoplasm of unspecified site of unspecified female breast: Secondary | ICD-10-CM

## 2020-07-08 DIAGNOSIS — Z171 Estrogen receptor negative status [ER-]: Secondary | ICD-10-CM | POA: Diagnosis not present

## 2020-07-08 LAB — CBC WITH DIFFERENTIAL/PLATELET
Abs Immature Granulocytes: 0.02 10*3/uL (ref 0.00–0.07)
Basophils Absolute: 0 10*3/uL (ref 0.0–0.1)
Basophils Relative: 1 %
Eosinophils Absolute: 0.3 10*3/uL (ref 0.0–0.5)
Eosinophils Relative: 3 %
HCT: 42.2 % (ref 36.0–46.0)
Hemoglobin: 14.4 g/dL (ref 12.0–15.0)
Immature Granulocytes: 0 %
Lymphocytes Relative: 33 %
Lymphs Abs: 2.6 10*3/uL (ref 0.7–4.0)
MCH: 30.4 pg (ref 26.0–34.0)
MCHC: 34.1 g/dL (ref 30.0–36.0)
MCV: 89.2 fL (ref 80.0–100.0)
Monocytes Absolute: 0.6 10*3/uL (ref 0.1–1.0)
Monocytes Relative: 7 %
Neutro Abs: 4.4 10*3/uL (ref 1.7–7.7)
Neutrophils Relative %: 56 %
Platelets: 180 10*3/uL (ref 150–400)
RBC: 4.73 MIL/uL (ref 3.87–5.11)
RDW: 13.4 % (ref 11.5–15.5)
WBC: 7.9 10*3/uL (ref 4.0–10.5)
nRBC: 0 % (ref 0.0–0.2)

## 2020-07-08 LAB — COMPREHENSIVE METABOLIC PANEL
ALT: 68 U/L — ABNORMAL HIGH (ref 0–44)
AST: 58 U/L — ABNORMAL HIGH (ref 15–41)
Albumin: 4.1 g/dL (ref 3.5–5.0)
Alkaline Phosphatase: 71 U/L (ref 38–126)
Anion gap: 13 (ref 5–15)
BUN: 17 mg/dL (ref 8–23)
CO2: 27 mmol/L (ref 22–32)
Calcium: 9.3 mg/dL (ref 8.9–10.3)
Chloride: 96 mmol/L — ABNORMAL LOW (ref 98–111)
Creatinine, Ser: 0.75 mg/dL (ref 0.44–1.00)
GFR, Estimated: >60 mL/min (ref >60)
Glucose, Bld: 114 mg/dL — ABNORMAL HIGH (ref 70–99)
Potassium: 3.1 mmol/L — ABNORMAL LOW (ref 3.5–5.1)
Sodium: 136 mmol/L (ref 135–145)
Total Bilirubin: 0.8 mg/dL (ref 0.3–1.2)
Total Protein: 7.3 g/dL (ref 6.5–8.1)

## 2020-07-09 LAB — CANCER ANTIGEN 15-3: CA 15-3: 20.6 U/mL (ref 0.0–25.0)

## 2020-07-09 LAB — CANCER ANTIGEN 27.29: CA 27.29: 19 U/mL (ref 0.0–38.6)

## 2020-07-11 ENCOUNTER — Inpatient Hospital Stay (HOSPITAL_BASED_OUTPATIENT_CLINIC_OR_DEPARTMENT_OTHER): Payer: PPO | Admitting: Oncology

## 2020-07-11 ENCOUNTER — Ambulatory Visit: Payer: PPO | Admitting: Radiation Oncology

## 2020-07-11 ENCOUNTER — Encounter: Payer: Self-pay | Admitting: Oncology

## 2020-07-11 VITALS — BP 143/83 | HR 65 | Temp 97.7°F | Resp 18 | Wt 244.8 lb

## 2020-07-11 DIAGNOSIS — G629 Polyneuropathy, unspecified: Secondary | ICD-10-CM | POA: Diagnosis not present

## 2020-07-11 DIAGNOSIS — R7401 Elevation of levels of liver transaminase levels: Secondary | ICD-10-CM

## 2020-07-11 DIAGNOSIS — C50919 Malignant neoplasm of unspecified site of unspecified female breast: Secondary | ICD-10-CM | POA: Diagnosis not present

## 2020-07-11 DIAGNOSIS — C50212 Malignant neoplasm of upper-inner quadrant of left female breast: Secondary | ICD-10-CM | POA: Diagnosis not present

## 2020-07-11 NOTE — Progress Notes (Signed)
Hematology/Oncology progress note [facility] Telephone:([phone]) [phone] [phone]   Patient Care Team: Idelle Crouch, MD as PCP - General (Internal Medicine) Bary Castilla, Forest Gleason, MD as Consulting Physician (General Surgery) Theodore Demark, RN as Oncology Nurse Navigator Noreene Filbert, MD as Referring Physician (Radiation Oncology) Earlie Server, MD as Consulting Physician (Oncology)  REFERRING PROVIDER: Idelle Crouch, MD REASON FOR VISIT:  Follow up for adjuvant chemotherapy for breast cancer.   HISTORY OF PRESENTING ILLNESS:  Megan Harris is a  70 y.o.  female present for management of  breast cancer  # Stage IB left triple negative breast cancer   03/27/2018 which showed left breast mass. She had diagnostic and ultrasound on 04/07/2018 which showed 0.9cm x 0.9cm x 1.4cm irregular border mixed echotexture mass at the left breast 11 o'clock 7 cm from nipple correlating to the mammographic finding. Ultrasound of the left axilla is negative She underwent biopsy of left breast mass. 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 Estrogen receptor negative, progesterone receptor negative, HER-2 negative.  S/p left lumpectomy and sentinel lymph node biopsy. pT1pN0 05/09/2018 Baseline MUGA showed no focal wall motion abnormality of the left ventricle.  Calculated LVEF 61%  # 05/24/2018- 07/05/2018 Adjuvant Chemotherapy ddAC- # 07/19/2018 started on weekly Taxol 80 mg/m. Starting cycle 4, Taxol was decreased to 62m/m2 # 08/23/2018 Cycle cycle 6, Taxol was decreased to 65 mg/m due to neuropathy. 10/04/2018 patient finished adjuvant chemotherapy with ddAC x 4 followed by 12 weekly Taxol. 12/13/2018, she finished adjuvant radiation.  #Patient was recommended for genetic testing and she declined. Mediport was discontinued per patient's preference  # 03/29/2019 patient  underwent annual diagnostic breast mammogram bilaterally. Mammogram showed new calcifications in the superior lateral right breast spanning 5 mm. Also there is an abnormal lymph node in the low left axilla representing a change from previous mammogram. 04/03/2019 patient underwent left axillary lymph node biopsy and pathology is negative for malignancy, fibroadipose tissue with necrosis and calcifications, compatible with prior radiation therapy.  Lymph node tissue is not identified. 04/10/2019 breast right upper outer quadrant biopsy showed fibroadenomatoid changes with associated calcifications.  Negative for atypia and malignancy.  Seen by Dr. BBary Castillarecently.  10/23/2019 CT abdomen pelvis was obtained for evaluation of pelvic pain.CT showed mild circumferential bladder wall thickening, she follows with urologist and there is a plan for cystoscopy per patient.  CT also showed hepatic steatosis, 2.1 cm right adnexal cystic mass.  # 11/09/2019, colonoscopy was performed by Dr. BBary Castillawhich showed diverticulosis in the rectosigmoid colon.  2 colon polyps was resected and retrieved.   01/21/2020 ultrasound pelvic showed small simple cyst in the right adnexa.  Patient was seen by Dr. HKenton Kingfishergynecology  INTERVAL HISTORY Megan KREIGERis a 70y.o. female who has above history reviewed by me presents for follow-up of stage I left triple negative breast cancer  She is on antibiotic and prednisone due to yellow jacket sting. Denies any concerns of breasts, new bone pain, chest or abdominal pain.   Review of Systems  Constitutional:  Negative for chills and fever.  HENT:  Negative for hearing loss.   Respiratory:  Negative for cough.   Cardiovascular:  Negative for chest pain.  Gastrointestinal:  Negative for abdominal pain, blood in stool and nausea.  Genitourinary:  Negative for frequency and urgency.  Skin:  Negative for itching and rash.  Neurological:  Negative for focal  weakness.   Psychiatric/Behavioral:  The patient is not nervous/anxious.    MEDICAL HISTORY:  Past Medical History:  Diagnosis Date   Abdominal pain, left lower quadrant 2012   Abnormal LFTs    Anemia    vitamin d deficiency   Arthritis    Asthma    Back pain 2005   lower back arthritis   Bone cancer (Megan Harris)    Breast cancer ([place]) 04/2018   left invasive mammary   Cancer ([place]) 04/26/2018   65m, T1c, N0 triple negative   Chronic cholecystitis 2012   has had choleycystectomy.  not an issue   Chronic insomnia    Constipation 2005   COPD (chronic obstructive pulmonary disease) (HCC)    Degenerative disc disease, cervical    Diabetes mellitus without complication ([place]    Diverticulitis    Endocrine problem 2005   Family history of bone cancer    Family history of brain cancer    Family history of colon cancer    Family history of lung cancer    GERD (gastroesophageal reflux disease) 2012   History of GI diverticular bleed    History of peptic ulcer disease    History of pneumonia    Hyperlipidemia 2012   Hypertension 2002   Nausea with vomiting 2012   Obesity, unspecified 2012   Personal history of chemotherapy    Personal history of radiation therapy 2020   invasive mammary   Pneumonia    PONV (postoperative nausea and vomiting)    Special screening for malignant neoplasms, colon 2012   Vitamin D deficiency     SURGICAL HISTORY: Past Surgical History:  Procedure Laterality Date   BILATERAL CARPAL TUNNEL RELEASE Bilateral    BREAST BIOPSY Left 04/13/2018   invasive mamm and DCIS   BREAST BIOPSY Right 04/10/2019   calcs bx, ribbon marker, benign    BREAST BIOPSY Left 04/03/2019   LN bx with Dr BBary Castilla FEATURES COMPATIBLE WITH PRIOR RADIATION THERAPY   BREAST EXCISIONAL BIOPSY Left early 90s   neg   BREAST LUMPECTOMY Left 04/26/2018   Procedure: BREAST LUMPECTOMY WITH EXCISION OF SENTINEL NODE;  Surgeon: BRobert Bellow MD;  Location: [facility] OR[facility];  Service: General;   Laterality: Left;   BUNIONECTOMY Bilateral 2003   CHOLECYSTECTOMY  06/04/2010   COLON RESECTION  2004   due diverticulitis    COLON SURGERY     [place]  COLONOSCOPY  06/28/2012   COLONOSCOPY WITH PROPOFOL N/A 11/09/2019   Procedure: COLONOSCOPY WITH PROPOFOL;  Surgeon: BRobert Bellow MD;  [facility];  Service: Endoscopy;  Laterality: N/A;   COLOSTOMY  2004   COLOSTOMY REVERSAL  2004   FOOT SURGERY Bilateral 2012   plantar faciatis   HAND SURGERY Bilateral    carpal tunnel   HERNIA REPAIR  2005   at colostomy site after reversal done   JOINT REPLACEMENT     KNEE ARTHROPLASTY Right 04/20/2017   Procedure: COMPUTER ASSISTED TOTAL KNEE ARTHROPLASTY;  Surgeon: HDereck Leep MD;  Location: [facility] OR[facility];  Service: Orthopedics;  Laterality: Right;   KNEE ARTHROSCOPY Right 02/03/2015   Procedure: ARTHROSCOPY right knee, partial medial menisectomy, condyle malleolus, patella and femoral;  Surgeon: JDereck Leep MD;  Location: [facility];  Service: Orthopedics;  Laterality: Right;   MASTECTOMY Left    partial lumpectomy'   NASAL SINUS SURGERY     2005   PORTACATH PLACEMENT Right 04/26/2018   Procedure: INSERTION PORT-A-CATH  RIGHT;  Surgeon: Robert Bellow, MD;  Location: [facility];  Service: General;  Laterality: Right;   SALPINGOOPHORECTOMY  1998   status post colectomy     TONSILLECTOMY     TUBAL LIGATION  1978   UPPER GI ENDOSCOPY  06/28/2012   VAGINAL HYSTERECTOMY  1987    SOCIAL HISTORY: Social History   Socioeconomic History   Marital status: Married    Spouse name: Roger   Number of children: 2   Years of education: Not on file   Highest education level: Not on file  Occupational History   Occupation: worked at [employer] in [place]: retired  Tobacco Use   Smoking status: Never   Smokeless tobacco: Never  Vaping Use   Vaping Use: Never used  Substance and Sexual Activity   Alcohol use: Not Currently   Drug use: No    Sexual activity: Yes    Birth control/protection: Surgical  Other Topics Concern   Not on file  Social History Narrative   Not on file   Social Determinants of Health   Financial Resource Strain: Not on file  Food Insecurity: Not on file  Transportation Needs: Not on file  Physical Activity: Not on file  Stress: Not on file  Social Connections: Not on file  Intimate Partner Violence: Not on file   Lives at home with husband.  FAMILY HISTORY: Family History  Problem Relation Age of Onset   Heart disease Father    Diabetes Paternal Grandmother    Diabetes Paternal Grandfather    Brain cancer Mother 11   Bone cancer Sister 60   Colon cancer Maternal Uncle        dx 50s-60s   Throat cancer Cousin    Leukemia Cousin    Lung cancer Cousin    Ovarian cancer Neg Hx    Breast cancer Neg Hx    Prostate cancer Neg Hx    Kidney cancer Neg Hx    Bladder Cancer Neg Hx     ALLERGIES:  is allergic to codeine, penicillins, and adhesive [tape].  MEDICATIONS:  Current Outpatient Medications  Medication Sig Dispense Refill   amLODipine (NORVASC) 5 MG tablet Take 5 mg by mouth daily.     atorvastatin (LIPITOR) 10 MG tablet Take 10 mg by mouth daily.     Calcium Carbonate-Vit D-Min (CALCIUM 1200 PO) Take by mouth.     doxycycline (VIBRAMYCIN) 100 MG capsule Take 100 mg by mouth 2 (two) times daily.     gabapentin (NEURONTIN) 300 MG capsule TAKE 2 CAPSULES BY MOUTH 3 TIMES DAILY (Patient taking differently: 2 (two) times daily.) 180 capsule 0   hydrochlorothiazide (HYDRODIURIL) 25 MG tablet Take 25 mg by mouth daily.     lansoprazole (PREVACID) 30 MG capsule Take 30 mg by mouth 2 (two) times daily.      levothyroxine (SYNTHROID, LEVOTHROID) 50 MCG tablet Take 50 mcg by mouth daily before breakfast.      loratadine (CLARITIN) 10 MG tablet Take 10 mg by mouth daily.     losartan (COZAAR) 100 MG tablet Take 100 mg by mouth daily.     Multiple Vitamins-Minerals (VITAMIN D3 COMPLETE PO)  Take by mouth.     Potassium 99 MG TABS Take by mouth daily.     Zinc Sulfate (ZINC 15 PO) Take by mouth.     zolpidem (AMBIEN) 10 MG tablet Take 10 mg by mouth at bedtime.     CINNAMON PO Take by  mouth. (Patient not taking: Reported on 07/11/2020)     meloxicam (MOBIC) 15 MG tablet Take by mouth. (Patient not taking: Reported on 07/11/2020)     No current facility-administered medications for this visit.     PHYSICAL EXAMINATION: ECOG PERFORMANCE STATUS: 1 - Symptomatic but completely ambulatory Vitals:   07/11/20 1033  BP: (!) 143/83  Pulse: 65  Resp: 18  Temp: 97.7 F (36.5 C)   Filed Weights   07/11/20 1033  Weight: 244 lb 12.8 oz (111 kg)    Physical Exam Constitutional:      General: She is not in acute distress.    Appearance: She is obese.  HENT:     Head: Normocephalic and atraumatic.  Eyes:     General: No scleral icterus.    Pupils: Pupils are equal, round, and reactive to light.  Cardiovascular:     Rate and Rhythm: Normal rate and regular rhythm.     Heart sounds: Normal heart sounds.  Pulmonary:     Effort: Pulmonary effort is normal. No respiratory distress.     Breath sounds: No wheezing.  Abdominal:     General: Bowel sounds are normal. There is no distension.     Palpations: Abdomen is soft. There is no mass.     Tenderness: There is no abdominal tenderness.  Musculoskeletal:        General: No deformity. Normal range of motion.     Cervical back: Normal range of motion and neck supple.  Skin:    General: Skin is warm and dry.     Findings: No erythema or rash.  Neurological:     Mental Status: She is alert and oriented to person, place, and time. Mental status is at baseline.     Cranial Nerves: No cranial nerve deficit.     Coordination: Coordination normal.  Psychiatric:        Mood and Affect: Mood normal.  Breast exam was performed in seated and lying down position. Left breast lumpectomy with a well-healed surgical scar. Mild tenderness  and focal tissue thickening at the lumpectomy site. Right breast medi port has been removed with focal tissue thicken around previous medi port site,  No palpable breast mass bilaterally. No palpable axillary lymphadenopathy.   LABORATORY DATA:  I have reviewed the data as listed Lab Results  Component Value Date   WBC 7.9 07/08/2020   HGB 14.4 07/08/2020   HCT 42.2 07/08/2020   MCV 89.2 07/08/2020   PLT 180 07/08/2020   Recent Labs    07/13/19 0921 10/22/19 1115 11/12/19 1009 03/12/20 1253 07/08/20 1528  NA 139 138 136 137 136  K 3.8 3.5 3.2* 4.4 3.1*  CL 100 99 98 98 96*  CO2 _0 GLUCOSE 161* 156* 169* 238* 114*  BUN _1 CREATININE 0.69 0.73 0.74 0.76 0.75  CALCIUM 8.9 9.4 8.7* 9.1 9.3  GFRNONAA >60 >60 >60 >60 >60  GFRAA >60 >60  --   --   --   PROT 7.0  --  7.0 6.9 7.3  ALBUMIN 3.9  --  3.9 3.9 4.1  AST 62*  --  40 51* 58*  ALT 72*  --  57* 64* 68*  ALKPHOS 82  --  83 80 71  BILITOT 0.9  --  0.9 0.8 0.8    Iron/TIBC/Ferritin/ %Sat No results found for: IRON, TIBC, FERRITIN, IRONPCTSAT   RADIOGRAPHIC STUDIES: I have personally reviewed the radiological images  as listed and agreed with the findings in the report. No results found.     ASSESSMENT & PLAN:  1. 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 ([place])   2. Neuropathy   3. Transaminitis   Cancer Staging 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 [facility]) Staging form: Breast, AJCC 8th Edition - Clinical stage from 04/28/2018: Stage IB (cT1c, cN0, cM0, G3, ER-, PR-, HER2-) - Signed by Earlie Server, MD on 04/29/2018 - Pathologic stage from 05/25/2018: Stage IB (pT1c, pN0(sn), cM0, G3, ER-, PR-, HER2-) - Signed by Earlie Server, MD on 05/25/2018   # Stage IB left triple negative breast cancer, lumpectomy with SLNB, adjuvant chemotherapy followed by adjuvant radiation. Labs are reviewed and discussed with patient. Recommend patient to continue take calcium and vitamin D supplementation March  2022 bilateral diagnostic mammgram was reviewed with patient.  Annual mammogram scheduled via her surgeon Dr. Dwyane Luo office.  #transaminitis due to fatty liver disease.  Stable.  #Pre-existing neuropathy, exacerbated by chemotherapy, continue gabapentin.    #Follow-up  4 months. . All questions were answered. The patient knows to call the clinic with any problems questions or concerns.  Earlie Server, MD, PhD 07/11/2020

## 2020-07-11 NOTE — Progress Notes (Signed)
Patient here for follow up. Pt is currently on antibiotic due to yellow jacket sting. Pt doing well today.

## 2020-08-05 DIAGNOSIS — Z79899 Other long term (current) drug therapy: Secondary | ICD-10-CM | POA: Diagnosis not present

## 2020-08-05 DIAGNOSIS — E7800 Pure hypercholesterolemia, unspecified: Secondary | ICD-10-CM | POA: Diagnosis not present

## 2020-08-05 DIAGNOSIS — E039 Hypothyroidism, unspecified: Secondary | ICD-10-CM | POA: Diagnosis not present

## 2020-08-05 DIAGNOSIS — R7309 Other abnormal glucose: Secondary | ICD-10-CM | POA: Diagnosis not present

## 2020-08-08 DIAGNOSIS — E039 Hypothyroidism, unspecified: Secondary | ICD-10-CM | POA: Diagnosis not present

## 2020-08-08 DIAGNOSIS — I10 Essential (primary) hypertension: Secondary | ICD-10-CM | POA: Diagnosis not present

## 2020-08-08 DIAGNOSIS — E559 Vitamin D deficiency, unspecified: Secondary | ICD-10-CM | POA: Diagnosis not present

## 2020-08-08 DIAGNOSIS — C50919 Malignant neoplasm of unspecified site of unspecified female breast: Secondary | ICD-10-CM | POA: Diagnosis not present

## 2020-08-08 DIAGNOSIS — E782 Mixed hyperlipidemia: Secondary | ICD-10-CM | POA: Diagnosis not present

## 2020-08-08 DIAGNOSIS — E118 Type 2 diabetes mellitus with unspecified complications: Secondary | ICD-10-CM | POA: Diagnosis not present

## 2020-10-09 DIAGNOSIS — C50212 Malignant neoplasm of upper-inner quadrant of left female breast: Secondary | ICD-10-CM | POA: Diagnosis not present

## 2020-10-09 DIAGNOSIS — R1032 Left lower quadrant pain: Secondary | ICD-10-CM | POA: Diagnosis not present

## 2020-10-16 DIAGNOSIS — Z20822 Contact with and (suspected) exposure to covid-19: Secondary | ICD-10-CM | POA: Diagnosis not present

## 2020-10-16 DIAGNOSIS — Z03818 Encounter for observation for suspected exposure to other biological agents ruled out: Secondary | ICD-10-CM | POA: Diagnosis not present

## 2020-11-10 ENCOUNTER — Inpatient Hospital Stay: Payer: PPO | Attending: Oncology

## 2020-11-10 ENCOUNTER — Other Ambulatory Visit: Payer: Self-pay

## 2020-11-10 DIAGNOSIS — Z9221 Personal history of antineoplastic chemotherapy: Secondary | ICD-10-CM | POA: Insufficient documentation

## 2020-11-10 DIAGNOSIS — G629 Polyneuropathy, unspecified: Secondary | ICD-10-CM | POA: Insufficient documentation

## 2020-11-10 DIAGNOSIS — Z171 Estrogen receptor negative status [ER-]: Secondary | ICD-10-CM | POA: Diagnosis not present

## 2020-11-10 DIAGNOSIS — C50212 Malignant neoplasm of upper-inner quadrant of left female breast: Secondary | ICD-10-CM | POA: Insufficient documentation

## 2020-11-10 DIAGNOSIS — R059 Cough, unspecified: Secondary | ICD-10-CM | POA: Insufficient documentation

## 2020-11-10 DIAGNOSIS — R7401 Elevation of levels of liver transaminase levels: Secondary | ICD-10-CM | POA: Insufficient documentation

## 2020-11-10 DIAGNOSIS — Z923 Personal history of irradiation: Secondary | ICD-10-CM | POA: Insufficient documentation

## 2020-11-10 DIAGNOSIS — C50919 Malignant neoplasm of unspecified site of unspecified female breast: Secondary | ICD-10-CM

## 2020-11-10 LAB — COMPREHENSIVE METABOLIC PANEL
ALT: 70 U/L — ABNORMAL HIGH (ref 0–44)
AST: 59 U/L — ABNORMAL HIGH (ref 15–41)
Albumin: 4.1 g/dL (ref 3.5–5.0)
Alkaline Phosphatase: 50 U/L (ref 38–126)
Anion gap: 11 (ref 5–15)
BUN: 15 mg/dL (ref 8–23)
CO2: 29 mmol/L (ref 22–32)
Calcium: 9.5 mg/dL (ref 8.9–10.3)
Chloride: 95 mmol/L — ABNORMAL LOW (ref 98–111)
Creatinine, Ser: 0.67 mg/dL (ref 0.44–1.00)
GFR, Estimated: >60 mL/min (ref >60)
Glucose, Bld: 131 mg/dL — ABNORMAL HIGH (ref 70–99)
Potassium: 3.7 mmol/L (ref 3.5–5.1)
Sodium: 135 mmol/L (ref 135–145)
Total Bilirubin: 0.9 mg/dL (ref 0.3–1.2)
Total Protein: 7.6 g/dL (ref 6.5–8.1)

## 2020-11-10 LAB — CBC WITH DIFFERENTIAL/PLATELET
Abs Immature Granulocytes: 0.01 10*3/uL (ref 0.00–0.07)
Basophils Absolute: 0 10*3/uL (ref 0.0–0.1)
Basophils Relative: 1 %
Eosinophils Absolute: 0.2 10*3/uL (ref 0.0–0.5)
Eosinophils Relative: 3 %
HCT: 43.4 % (ref 36.0–46.0)
Hemoglobin: 14.5 g/dL (ref 12.0–15.0)
Immature Granulocytes: 0 %
Lymphocytes Relative: 32 %
Lymphs Abs: 2.1 10*3/uL (ref 0.7–4.0)
MCH: 30.1 pg (ref 26.0–34.0)
MCHC: 33.4 g/dL (ref 30.0–36.0)
MCV: 90 fL (ref 80.0–100.0)
Monocytes Absolute: 0.4 10*3/uL (ref 0.1–1.0)
Monocytes Relative: 6 %
Neutro Abs: 3.7 10*3/uL (ref 1.7–7.7)
Neutrophils Relative %: 58 %
Platelets: 203 10*3/uL (ref 150–400)
RBC: 4.82 MIL/uL (ref 3.87–5.11)
RDW: 13.8 % (ref 11.5–15.5)
WBC: 6.4 10*3/uL (ref 4.0–10.5)
nRBC: 0 % (ref 0.0–0.2)

## 2020-11-11 LAB — CANCER ANTIGEN 27.29: CA 27.29: 35.7 U/mL (ref 0.0–38.6)

## 2020-11-11 LAB — CANCER ANTIGEN 15-3: CA 15-3: 23.4 U/mL (ref 0.0–25.0)

## 2020-11-12 ENCOUNTER — Ambulatory Visit: Payer: PPO | Admitting: Oncology

## 2020-11-14 ENCOUNTER — Ambulatory Visit
Admission: RE | Admit: 2020-11-14 | Discharge: 2020-11-14 | Disposition: A | Discharge status: Home / Self Care | Payer: PPO | Source: Ambulatory Visit | Attending: Oncology | Admitting: Oncology

## 2020-11-14 ENCOUNTER — Encounter: Payer: Self-pay | Admitting: Oncology

## 2020-11-14 ENCOUNTER — Inpatient Hospital Stay: Payer: PPO | Admitting: Oncology

## 2020-11-14 ENCOUNTER — Other Ambulatory Visit: Payer: Self-pay

## 2020-11-14 VITALS — BP 165/81 | HR 75 | Temp 98.3°F | Wt 238.7 lb

## 2020-11-14 DIAGNOSIS — C50212 Malignant neoplasm of upper-inner quadrant of left female breast: Secondary | ICD-10-CM | POA: Diagnosis not present

## 2020-11-14 DIAGNOSIS — R059 Cough, unspecified: Secondary | ICD-10-CM

## 2020-11-14 DIAGNOSIS — G629 Polyneuropathy, unspecified: Secondary | ICD-10-CM

## 2020-11-14 DIAGNOSIS — R7401 Elevation of levels of liver transaminase levels: Secondary | ICD-10-CM

## 2020-11-14 DIAGNOSIS — J811 Chronic pulmonary edema: Secondary | ICD-10-CM | POA: Diagnosis not present

## 2020-11-14 DIAGNOSIS — Z17421 Hormone receptor negative with human epidermal growth factor receptor 2 negative status: Secondary | ICD-10-CM

## 2020-11-14 DIAGNOSIS — C50919 Malignant neoplasm of unspecified site of unspecified female breast: Secondary | ICD-10-CM | POA: Diagnosis not present

## 2020-11-14 MED ORDER — GABAPENTIN 300 MG PO CAPS: 600.0000 mg | ORAL_CAPSULE | Freq: Two times a day (BID) | ORAL | 3 refills | Status: DC

## 2020-11-14 NOTE — Progress Notes (Signed)
Hematology/Oncology progress note [facility] Telephone:(336) [phone] Fax:(336) [phone]   Patient Care Team: Idelle Crouch, MD as PCP - General (Internal Medicine) Bary Castilla, Forest Gleason, MD as Consulting Physician (General Surgery) Theodore Demark, RN as Oncology Nurse Navigator Noreene Filbert, MD as Referring Physician (Radiation Oncology) Earlie Server, MD as Consulting Physician (Oncology)  REFERRING PROVIDER: Idelle Crouch, MD REASON FOR VISIT:  Follow up for adjuvant chemotherapy for breast cancer.   HISTORY OF PRESENTING ILLNESS:  Megan Harris is a  70 y.o.  female present for management of  breast cancer  # Stage IB left triple negative breast cancer   03/27/2018 which showed left breast mass. She had diagnostic and ultrasound on 04/07/2018 which showed 0.9cm x 0.9cm x 1.4cm irregular border mixed echotexture mass at the left breast 11 o'clock 7 cm from nipple correlating to the mammographic finding. Ultrasound of the left axilla is negative She underwent biopsy of left breast mass. 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 Estrogen receptor negative, progesterone receptor negative, HER-2 negative.  S/p left lumpectomy and sentinel lymph node biopsy. pT1pN0 05/09/2018 Baseline MUGA showed no focal wall motion abnormality of the left ventricle.  Calculated LVEF 61%  # 05/24/2018- 07/05/2018 Adjuvant Chemotherapy ddAC- # 07/19/2018 started on weekly Taxol 80 mg/m. Starting cycle 4, Taxol was decreased to $RemoveBefo'70mg'hNtoBLXeWmn$ /m2 # 08/23/2018 Cycle cycle 6, Taxol was decreased to 65 mg/m due to neuropathy. 10/04/2018 patient finished adjuvant chemotherapy with ddAC x 4 followed by 12 weekly Taxol. 12/13/2018, she finished adjuvant radiation.  #Patient was recommended for genetic testing and she declined. Mediport was discontinued per patient's preference  # 03/29/2019 patient  underwent annual diagnostic breast mammogram bilaterally. Mammogram showed new calcifications in the superior lateral right breast spanning 5 mm. Also there is an abnormal lymph node in the low left axilla representing a change from previous mammogram. 04/03/2019 patient underwent left axillary lymph node biopsy and pathology is negative for malignancy, fibroadipose tissue with necrosis and calcifications, compatible with prior radiation therapy.  Lymph node tissue is not identified. 04/10/2019 breast right upper outer quadrant biopsy showed fibroadenomatoid changes with associated calcifications.  Negative for atypia and malignancy.  Seen by Dr. Bary Castilla recently.  10/23/2019 CT abdomen pelvis was obtained for evaluation of pelvic pain.CT showed mild circumferential bladder wall thickening, she follows with urologist and there is a plan for cystoscopy per patient.  CT also showed hepatic steatosis, 2.1 cm right adnexal cystic mass.  # 11/09/2019, colonoscopy was performed by Dr. Bary Castilla which showed diverticulosis in the rectosigmoid colon.  2 colon polyps was resected and retrieved.   01/21/2020 ultrasound pelvic showed small simple cyst in the right adnexa.  Patient was seen by Dr. Kenton Kingfisher gynecology  INTERVAL HISTORY VEE BAHE is a 70 y.o. female who has above history reviewed by me presents for follow-up of stage I left triple negative breast cancer Patient reports feeling well.  She denies any new bone pain.  Denies any breast concerns.  Cough for about a month. She has had some chronic intermittent left breast tenderness.  Review of Systems  Constitutional:  Negative for chills and fever.  HENT:  Negative for hearing loss.   Respiratory:  Negative for cough.   Cardiovascular:  Negative for chest pain.  Gastrointestinal:  Negative for abdominal pain, blood in stool and nausea.  Genitourinary:  Negative for frequency and urgency.  Skin:  Negative for itching and rash.  Neurological:   Negative for focal weakness.  Psychiatric/Behavioral:  The patient is not nervous/anxious.    MEDICAL HISTORY:  Past Medical History:  Diagnosis Date   Abdominal pain, left lower quadrant 2012   Abnormal LFTs    Anemia    vitamin d deficiency   Arthritis    Asthma    Back pain 2005   lower back arthritis   Bone cancer ([place])    Breast cancer ([place]) 04/2018   left invasive mammary   Cancer ([place]) 04/26/2018   56m, T1c, N0 triple negative   Chronic cholecystitis 2012   has had choleycystectomy.  not an issue   Chronic insomnia    Constipation 2005   COPD (chronic obstructive pulmonary disease) (HCC)    Degenerative disc disease, cervical    [place]    Diverticulitis    Endocrine problem 2005   Family history of bone cancer    Family history of brain cancer    Family history of colon cancer    Family history of lung cancer    GERD (gastroesophageal reflux disease) 2012   History of GI diverticular bleed    History of peptic ulcer disease    History of pneumonia    Hyperlipidemia 2012   Hypertension 2002   Nausea with vomiting 2012   Obesity, unspecified 2012   Personal history of chemotherapy    Personal history of radiation therapy 2020   invasive mammary   Pneumonia    PONV (postoperative nausea and vomiting)    Special screening for malignant neoplasms, colon 2012   Vitamin D deficiency     SURGICAL HISTORY: Past Surgical History:  Procedure Laterality Date   BILATERAL CARPAL TUNNEL RELEASE Bilateral    BREAST BIOPSY Left 04/13/2018   invasive mamm and DCIS   BREAST BIOPSY Right 04/10/2019   calcs bx, ribbon marker, benign    BREAST BIOPSY Left 04/03/2019   LN bx with Dr BBary Castilla FEATURES COMPATIBLE WITH PRIOR RADIATION THERAPY   BREAST EXCISIONAL BIOPSY Left early 90s   neg   BREAST LUMPECTOMY Left 04/26/2018   Procedure: BREAST LUMPECTOMY WITH EXCISION OF SENTINEL NODE;  Surgeon: BRobert Bellow MD;  Location: [facility]  ORS;  Service: General;  Laterality: Left;   BUNIONECTOMY Bilateral 2003   CHOLECYSTECTOMY  06/04/2010   COLON RESECTION  2004   due diverticulitis    COLON SURGERY     [place]  COLONOSCOPY  06/28/2012   COLONOSCOPY WITH PROPOFOL N/A 11/09/2019   Procedure: COLONOSCOPY WITH PROPOFOL;  Surgeon: BRobert Bellow MD;  [facility];  Service: Endoscopy;  Laterality: N/A;   COLOSTOMY  2004   COLOSTOMY REVERSAL  2004   FOOT SURGERY Bilateral 2012   plantar faciatis   HAND SURGERY Bilateral    carpal tunnel   HERNIA REPAIR  2005   at colostomy site after reversal done   JOINT REPLACEMENT     KNEE ARTHROPLASTY Right 04/20/2017   Procedure: COMPUTER ASSISTED TOTAL KNEE ARTHROPLASTY;  Surgeon: HDereck Leep MD;  Location: [facility] ORS;  Service: Orthopedics;  Laterality: Right;   KNEE ARTHROSCOPY Right 02/03/2015   Procedure: ARTHROSCOPY right knee, partial medial menisectomy, condyle malleolus, patella and femoral;  Surgeon: JDereck Leep MD;  Location: [facility] ORS;  Service: Orthopedics;  Laterality: Right;   MASTECTOMY Left    partial lumpectomy'   NASAL SINUS SURGERY     2005   PORTACATH PLACEMENT Right 04/26/2018  Procedure: INSERTION PORT-A-CATH RIGHT;  Surgeon: Robert Bellow, MD;  Location: [facility] ORS;  Service: General;  Laterality: Right;   SALPINGOOPHORECTOMY  1998   status post colectomy     TONSILLECTOMY     TUBAL LIGATION  1978   UPPER GI ENDOSCOPY  06/28/2012   VAGINAL HYSTERECTOMY  1987    SOCIAL HISTORY: Social History   Socioeconomic History   Marital status: Married    Spouse name: Roger   Number of children: 2   Years of education: Not on file   Highest education level: Not on file  Occupational History   Occupation: worked at [employer] in [place]: retired  Tobacco Use   Smoking status: Never   Smokeless tobacco: Never  Vaping Use   Vaping Use: Never used  Substance and Sexual Activity   Alcohol use: Not  Currently   Drug use: No   Sexual activity: Yes    Birth control/protection: Surgical  Other Topics Concern   Not on file  Social History Narrative   Not on file   Social Determinants of Health   Financial Resource Strain: Not on file  Food Insecurity: Not on file  Transportation Needs: Not on file  Physical Activity: Not on file  Stress: Not on file  Social Connections: Not on file  Intimate Partner Violence: Not on file   Lives at home with husband.  FAMILY HISTORY: Family History  Problem Relation Age of Onset   Heart disease Father    Diabetes Paternal Grandmother    Diabetes Paternal Grandfather    Brain cancer Mother 60   Bone cancer Sister 59   Colon cancer Maternal Uncle        dx 50s-60s   Throat cancer Cousin    Leukemia Cousin    Lung cancer Cousin    Ovarian cancer Neg Hx    Breast cancer Neg Hx    Prostate cancer Neg Hx    Kidney cancer Neg Hx    Bladder Cancer Neg Hx     ALLERGIES:  is allergic to codeine, penicillins, and adhesive [tape].  MEDICATIONS:  Current Outpatient Medications  Medication Sig Dispense Refill   amLODipine (NORVASC) 5 MG tablet Take 5 mg by mouth daily.     atorvastatin (LIPITOR) 10 MG tablet Take 10 mg by mouth daily.     Calcium Carbonate-Vit D-Min (CALCIUM 1200 PO) Take by mouth.     doxycycline (VIBRAMYCIN) 100 MG capsule Take 100 mg by mouth 2 (two) times daily.     gabapentin (NEURONTIN) 300 MG capsule TAKE 2 CAPSULES BY MOUTH 3 TIMES DAILY (Patient taking differently: 2 (two) times daily.) 180 capsule 0   hydrochlorothiazide (HYDRODIURIL) 25 MG tablet Take 25 mg by mouth daily.     lansoprazole (PREVACID) 30 MG capsule Take 30 mg by mouth 2 (two) times daily.      levothyroxine (SYNTHROID, LEVOTHROID) 50 MCG tablet Take 50 mcg by mouth daily before breakfast.      loratadine (CLARITIN) 10 MG tablet Take 10 mg by mouth daily.     losartan (COZAAR) 100 MG tablet Take 100 mg by mouth daily.     Multiple Vitamins-Minerals  (VITAMIN D3 COMPLETE PO) Take by mouth.     Potassium 99 MG TABS Take by mouth daily.     Zinc Sulfate (ZINC 15 PO) Take by mouth.     CINNAMON PO Take by mouth. (Patient not taking: No sig reported)     meloxicam (MOBIC)  15 MG tablet Take by mouth. (Patient not taking: No sig reported)     propranolol (INDERAL) 10 MG tablet Take 10 mg by mouth 2 (two) times daily. Tablet(s) By Mouth Twice Daily (Patient not taking: Reported on 11/14/2020)     zolpidem (AMBIEN) 10 MG tablet Take 10 mg by mouth at bedtime.     No current facility-administered medications for this visit.     PHYSICAL EXAMINATION: ECOG PERFORMANCE STATUS: 1 - Symptomatic but completely ambulatory Vitals:   11/14/20 0950  BP: (!) 165/81  Pulse: 75  Temp: 98.3 F (36.8 C)   Filed Weights   11/14/20 0950  Weight: 238 lb 11.2 oz (108.3 kg)    Physical Exam Constitutional:      General: She is not in acute distress.    Appearance: She is obese.  HENT:     Head: Normocephalic and atraumatic.  Eyes:     General: No scleral icterus.    Pupils: Pupils are equal, round, and reactive to light.  Cardiovascular:     Rate and Rhythm: Normal rate and regular rhythm.     Heart sounds: Normal heart sounds.  Pulmonary:     Effort: Pulmonary effort is normal. No respiratory distress.     Breath sounds: No wheezing.  Abdominal:     General: Bowel sounds are normal. There is no distension.     Palpations: Abdomen is soft. There is no mass.     Tenderness: There is no abdominal tenderness.  Musculoskeletal:        General: No deformity. Normal range of motion.     Cervical back: Normal range of motion and neck supple.  Skin:    General: Skin is warm and dry.     Findings: No erythema or rash.  Neurological:     Mental Status: She is alert and oriented to person, place, and time. Mental status is at baseline.     Cranial Nerves: No cranial nerve deficit.     Coordination: Coordination normal.  Psychiatric:        Mood  and Affect: Mood normal.  Breast exam was performed in seated and lying down position. Left breast lumpectomy with a well-healed surgical scar. Mild tenderness and focal tissue thickening at the lumpectomy site. Right breast medi port has been removed with focal tissue thicken around previous medi port site,  No palpable breast mass bilaterally. No palpable axillary lymphadenopathy.   LABORATORY DATA:  I have reviewed the data as listed Lab Results  Component Value Date   WBC 6.4 11/10/2020   HGB 14.5 11/10/2020   HCT 43.4 11/10/2020   MCV 90.0 11/10/2020   PLT 203 11/10/2020   Recent Labs    03/12/20 1253 07/08/20 1528 11/10/20 1118  NA 137 136 135  K 4.4 3.1* 3.7  CL 98 96* 95*  CO2 _0 GLUCOSE 238* 114* 131*  BUN _1 CREATININE 0.76 0.75 0.67  CALCIUM 9.1 9.3 9.5  GFRNONAA >60 >60 >60  PROT 6.9 7.3 7.6  ALBUMIN 3.9 4.1 4.1  AST 51* 58* 59*  ALT 64* 68* 70*  ALKPHOS 80 71 50  BILITOT 0.8 0.8 0.9    Iron/TIBC/Ferritin/ %Sat No results found for: IRON, TIBC, FERRITIN, IRONPCTSAT   RADIOGRAPHIC STUDIES: I have personally reviewed the radiological images as listed and agreed with the findings in the report. No results found.    ASSESSMENT & PLAN:  1. 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 ([place])  2. Neuropathy   3. Cough, unspecified type   4. Transaminitis   Cancer Staging 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 [facility]) Staging form: Breast, AJCC 8th Edition - Clinical stage from 04/28/2018: Stage IB (cT1c, cN0, cM0, G3, ER-, PR-, HER2-) - Signed by Earlie Server, MD on 04/29/2018 - Pathologic stage from 05/25/2018: Stage IB (pT1c, pN0(sn), cM0, G3, ER-, PR-, HER2-) - Signed by Earlie Server, MD on 05/25/2018   # Stage IB left triple negative breast cancer, lumpectomy with SLNB, adjuvant chemotherapy followed by adjuvant radiation. Labs reviewed and discussed with patient. Tumor markers slightly trending up however still within normal limits.  Continue  monitoring. Patient is due for bilateral mammogram in March 2023.  She gets mammogram ordered through Dr. Dwyane Luo office.  #transaminitis due to fatty liver disease.  Stable.  #Pre-existing neuropathy, exacerbated by chemotherapy, continue gabapentin.   #Cough, I will obtain chest x-ray  #Follow-up  4 months. . All questions were answered. The patient knows to call the clinic with any problems questions or concerns.  Earlie Server, MD, PhD 11/14/2020

## 2020-11-14 NOTE — Progress Notes (Signed)
Patient here for follow up. Patient is concerned with recent lab work.

## 2020-12-02 DIAGNOSIS — R399 Unspecified symptoms and signs involving the genitourinary system: Secondary | ICD-10-CM | POA: Diagnosis not present

## 2020-12-09 DIAGNOSIS — Z20822 Contact with and (suspected) exposure to covid-19: Secondary | ICD-10-CM | POA: Diagnosis not present

## 2020-12-09 DIAGNOSIS — Z03818 Encounter for observation for suspected exposure to other biological agents ruled out: Secondary | ICD-10-CM | POA: Diagnosis not present

## 2020-12-12 ENCOUNTER — Other Ambulatory Visit: Payer: Self-pay

## 2020-12-12 ENCOUNTER — Ambulatory Visit
Admission: EM | Admit: 2020-12-12 | Discharge: 2020-12-12 | Disposition: A | Discharge status: Home / Self Care | Payer: PPO | Attending: Physician Assistant | Admitting: Physician Assistant

## 2020-12-12 ENCOUNTER — Ambulatory Visit (INDEPENDENT_AMBULATORY_CARE_PROVIDER_SITE_OTHER): Payer: PPO

## 2020-12-12 DIAGNOSIS — R062 Wheezing: Secondary | ICD-10-CM | POA: Diagnosis not present

## 2020-12-12 DIAGNOSIS — R059 Cough, unspecified: Secondary | ICD-10-CM | POA: Diagnosis not present

## 2020-12-12 DIAGNOSIS — R051 Acute cough: Secondary | ICD-10-CM | POA: Diagnosis not present

## 2020-12-12 DIAGNOSIS — J45901 Unspecified asthma with (acute) exacerbation: Secondary | ICD-10-CM | POA: Diagnosis not present

## 2020-12-12 MED ORDER — ALBUTEROL SULFATE HFA 108 (90 BASE) MCG/ACT IN AERS: 1.0000 | INHALATION_SPRAY | Freq: Four times a day (QID) | RESPIRATORY_TRACT | 0 refills | Status: AC | PRN

## 2020-12-12 NOTE — Discharge Instructions (Signed)
-  I will call with the results of your chest x-ray when it returns.  If there is any evidence of pneumonia, may need to send an additional antibiotic for you. - If no evidence of pneumonia, likely a viral illness which should run its course in the next several days.  I have sent an inhaler for you to try since you have a history of asthma and the wheezing.  This should be helpful.  Can take over-the-counter cough medications as needed. - If you run a fever, chest pain or increased breathing difficulty he should be reexamined.  Otherwise, please follow-up with PCP.

## 2020-12-12 NOTE — ED Triage Notes (Signed)
Patient presents to Urgent Care with complaints of cough since Monday. Pt states she was treated with steroid and antibiotics. She wants to make sure her lungs are clear. Negative pcr test Tuesday.   Denies fever or SOB.

## 2020-12-12 NOTE — ED Provider Notes (Signed)
[facility]    CSN: [id number] Arrival date & time: 12/12/20  1255      History   Chief Complaint Chief Complaint  Patient presents with   Cough    HPI Megan Harris is a 70 y.o. female presenting for 5-day history of cough.  Cough is productive.  Patient says that she has been taking erythromycin and prednisone as prescribed by her PCP.  Patient reports feeling about the same to maybe slightly better.  She says that she would like a chest x-ray to make sure she does not have pneumonia.  She has not had any fevers or fatigue.  Denies any breathing difficulty but admits to hearing herself wheeze occasionally.  She does have a history of asthma and says she does not have any inhalers or nebulizers.  There is also history of COPD in her chart.  Patient says she had a negative COVID test at onset of symptoms.  No known COVID exposure reported.  She is denying any chest pain.  No other complaints or concerns.  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  not an issue   Chronic insomnia    Constipation 2005   COPD (chronic obstructive pulmonary disease) (HCC)    Degenerative disc disease, cervical    Diabetes mellitus without complication ([place])    Diverticulitis    Endocrine problem 2005   Family history of bone cancer    Family history of brain cancer    Family history of colon cancer    Family history of lung cancer    GERD (gastroesophageal reflux disease) 2012   History of GI diverticular bleed    History of peptic ulcer disease    History of pneumonia    Hyperlipidemia 2012   Hypertension 2002   Nausea with  vomiting 2012   Obesity, unspecified 2012   Personal history of chemotherapy    Personal history of radiation therapy 2020   invasive mammary   Pneumonia    PONV (postoperative nausea and vomiting)    Special screening for malignant neoplasms, colon 2012   Vitamin D deficiency     Patient Active Problem List   Diagnosis Date Noted   Transaminitis 07/13/2019   Neuropathy 07/13/2019   Mucositis 07/05/2018   Hypomagnesemia 07/05/2018   Encounter for antineoplastic chemotherapy 07/05/2018   Dehydration 06/15/2018   Chemotherapy-induced neutropenia ([place]) 06/07/2018   Goals of care, counseling/discussion 05/25/2018   Port-A-Cath in place 05/25/2018   Family history of brain cancer    Family history of bone cancer    Family history of colon cancer    Family history of lung cancer    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 ([place]) 04/29/2018   Asthma without status asthmaticus 04/20/2017   Benign essential HTN 04/20/2017   Chronic insomnia 04/20/2017   Degenerative disc disease, cervical 04/20/2017   History of GI diverticular bleed 04/20/2017   History of peptic ulcer disease 04/20/2017   Hyperlipidemia, unspecified 04/20/2017   Vitamin D deficiency, unspecified 04/20/2017   S/P total  knee arthroplasty 04/20/2017   Primary osteoarthritis of right knee 04/10/2017   Sciatica of left side 03/17/2017   Trochanteric bursitis of left hip 03/17/2017   Status post bilateral salpingo-oophorectomy (BSO) 03/02/2016   Obesity, Class III, BMI 40-49.9 (morbid obesity) ([place]) 03/02/2016   Urinary urgency 03/02/2016   Urinary frequency 03/02/2016   Vulvar lesion 03/02/2016   Status post vaginal hysterectomy 03/02/2016   Surgical menopause 03/02/2016   Dyspareunia, female 03/02/2016   Vaginal atrophy 03/02/2016   Mixed stress and urge urinary incontinence 03/02/2016   Morbid obesity with BMI of 40.0-44.9, adult ([place]) 06/10/2015   Nausea alone 06/19/2012   Encounter for screening  colonoscopy for non-high-risk patient 04/27/2012    Past Surgical History:  Procedure Laterality Date   BILATERAL CARPAL TUNNEL RELEASE Bilateral    BREAST BIOPSY Left 04/13/2018   invasive mamm and DCIS   BREAST BIOPSY Right 04/10/2019   calcs bx, ribbon marker, benign    BREAST BIOPSY Left 04/03/2019   LN bx with Dr Bary Castilla, [place] [facility]   BREAST EXCISIONAL BIOPSY Left early 90s   neg   BREAST LUMPECTOMY Left 04/26/2018   Procedure: BREAST LUMPECTOMY WITH EXCISION OF SENTINEL NODE;  Surgeon: Robert Bellow, MD;  Location: [facility];  Service: General;  Laterality: Left;   BUNIONECTOMY Bilateral 2003   CHOLECYSTECTOMY  06/04/2010   COLON RESECTION  2004   due diverticulitis    COLON SURGERY     COLONOSCOPY  2005   [place], Dr. Bary Castilla   COLONOSCOPY  06/28/2012   COLONOSCOPY WITH PROPOFOL N/A 11/09/2019   Procedure: COLONOSCOPY WITH PROPOFOL;  Surgeon: Robert Bellow, MD;  Location: [facility];  Service: Endoscopy;  Laterality: N/A;   COLOSTOMY  2004   COLOSTOMY REVERSAL  2004   FOOT SURGERY Bilateral 2012   plantar faciatis   HAND SURGERY Bilateral    carpal tunnel   HERNIA REPAIR  2005   at colostomy site after reversal done   JOINT REPLACEMENT     KNEE ARTHROPLASTY Right 04/20/2017   Procedure: COMPUTER ASSISTED TOTAL KNEE ARTHROPLASTY;  Surgeon: Dereck Leep, MD;  Location: [facility];  Service: Orthopedics;  Laterality: Right;   KNEE ARTHROSCOPY Right 02/03/2015   Procedure: ARTHROSCOPY right knee, partial medial menisectomy, condyle malleolus, patella and femoral;  Surgeon: Dereck Leep, MD;  Location: [facility];  Service: Orthopedics;  Laterality: Right;   MASTECTOMY Left    partial lumpectomy'   NASAL SINUS SURGERY     2005   PORTACATH PLACEMENT Right 04/26/2018   Procedure: INSERTION PORT-A-CATH RIGHT;  Surgeon: Robert Bellow, MD;  Location: [facility];  Service: General;  Laterality: Right;   SALPINGOOPHORECTOMY  1998    status post colectomy     TONSILLECTOMY     TUBAL LIGATION  1978   UPPER GI ENDOSCOPY  06/28/2012   VAGINAL HYSTERECTOMY  1987    OB History     Gravida  4   Para  2   Term  2   Preterm      AB  2   Living  2      SAB  2   IAB      Ectopic      Multiple      Live Births  2        Obstetric Comments  Menstrual age: 62   Age 1st Pregnancy: 78           Home Medications  Prior to Admission medications   Medication Sig Start Date End Date Taking? Authorizing Provider  albuterol (VENTOLIN HFA) 108 (90 Base) MCG/ACT inhaler Inhale 1-2 puffs into the lungs every 6 (six) hours as needed for wheezing or shortness of breath. 12/12/20  Yes Laurene Footman B, PA-C  amLODipine (NORVASC) 5 MG tablet Take 5 mg by mouth daily.    [provider]  atorvastatin (LIPITOR) 10 MG tablet Take 10 mg by mouth daily.    [provider]  Calcium Carbonate-Vit D-Min (CALCIUM 1200 PO) Take by mouth.    [provider]  CINNAMON PO Take by mouth. Patient not taking: No sig reported    [provider]  doxycycline (VIBRAMYCIN) 100 MG capsule Take 100 mg by mouth 2 (two) times daily. 07/08/20   [provider]  gabapentin (NEURONTIN) 300 MG capsule Take 2 capsules (600 mg total) by mouth 2 (two) times daily. 11/14/20   Earlie Server, MD  hydrochlorothiazide (HYDRODIURIL) 25 MG tablet Take 25 mg by mouth daily. 11/28/17   [provider]  lansoprazole (PREVACID) 30 MG capsule Take 30 mg by mouth 2 (two) times daily.     [provider]  levothyroxine (SYNTHROID, LEVOTHROID) 50 MCG tablet Take 50 mcg by mouth daily before breakfast.  03/03/18   [provider]  loratadine (CLARITIN) 10 MG tablet Take 10 mg by mouth daily.    [provider]  losartan (COZAAR) 100 MG tablet Take 100 mg by mouth daily.    [provider]  meloxicam (MOBIC) 15 MG tablet Take by mouth. Patient not taking: No sig reported  01/02/20 01/01/21  [provider]  Multiple Vitamins-Minerals (VITAMIN D3 COMPLETE PO) Take by mouth.    [provider]  Potassium 99 MG TABS Take by mouth daily.    [provider]  propranolol (INDERAL) 10 MG tablet Take 10 mg by mouth 2 (two) times daily. Tablet(s) By Mouth Twice Daily Patient not taking: Reported on 11/14/2020 11/12/20   [provider]  Zinc Sulfate (ZINC 15 PO) Take by mouth.    [provider]  zolpidem (AMBIEN) 10 MG tablet Take 10 mg by mouth at bedtime.    [provider]    Family History Family History  Problem Relation Age of Onset   Heart disease Father    Diabetes Paternal Grandmother    Diabetes Paternal Grandfather    Brain cancer Mother 70   Bone cancer Sister 59   Colon cancer Maternal Uncle        dx 50s-60s   Throat cancer Cousin    Leukemia Cousin    Lung cancer Cousin    Ovarian cancer Neg Hx    Breast cancer Neg Hx    Prostate cancer Neg Hx    Kidney cancer Neg Hx    Bladder Cancer Neg Hx     Social History Social History   Tobacco Use   Smoking status: Never   Smokeless tobacco: Never  Vaping Use   Vaping Use: Never used  Substance Use Topics   Alcohol use: Not Currently   Drug use: No     Allergies   Codeine, Penicillins, and Adhesive [tape]   Review of Systems Review of Systems  Constitutional:  Positive for fatigue. Negative for chills, diaphoresis and fever.  HENT:  Positive for congestion and rhinorrhea. Negative for ear pain, sinus pressure, sinus pain and sore throat.   Respiratory:  Positive for cough. Negative for shortness of breath.  Gastrointestinal:  Negative for abdominal pain, nausea and vomiting.  Musculoskeletal:  Negative for arthralgias and myalgias.  Skin:  Negative for rash.  Neurological:  Negative for weakness and headaches.  Hematological:  Negative for adenopathy.    Physical Exam Triage Vital Signs ED Triage Vitals  Enc Vitals Group      BP 12/12/20 1443 (S) (!) 161/111     Pulse Rate 12/12/20 1443 77     Resp 12/12/20 1443 18     Temp 12/12/20 1443 97.9 F (36.6 C)     Temp Source 12/12/20 1443 Oral     SpO2 12/12/20 1443 98 %     Weight --      Height --      Head Circumference --      Peak Flow --      Pain Score 12/12/20 1442 0     Pain Loc --      Pain Edu? --      Excl. in [place]? --    No data found.  Updated Vital Signs BP (S) (!) 161/111 (BP Location: Right Arm)   Pulse 77   Temp 97.9 F (36.6 C) (Oral)   Resp 18   SpO2 98%     Physical Exam Vitals and nursing note reviewed.  Constitutional:      General: She is not in acute distress.    Appearance: Normal appearance. She is not ill-appearing or toxic-appearing.  HENT:     Head: Normocephalic and atraumatic.     Nose: Congestion present.     Mouth/Throat:     Mouth: Mucous membranes are moist.     Pharynx: Oropharynx is clear. Posterior oropharyngeal erythema (mild with clear PND) present.  Eyes:     General: No scleral icterus.       Right eye: No discharge.        Left eye: No discharge.     Conjunctiva/sclera: Conjunctivae normal.  Cardiovascular:     Rate and Rhythm: Normal rate and regular rhythm.     Heart sounds: Normal heart sounds.  Pulmonary:     Effort: Pulmonary effort is normal. No respiratory distress.     Breath sounds: Wheezing (mild wheezing bilateral upper lund fields) present.  Musculoskeletal:     Cervical back: Neck supple.  Skin:    General: Skin is dry.  Neurological:     General: No focal deficit present.     Mental Status: She is alert. Mental status is at baseline.     Motor: No weakness.     Gait: Gait normal.  Psychiatric:        Mood and Affect: Mood normal.        Behavior: Behavior normal.        Thought Content: Thought content normal.     UC Treatments / Results  Labs (all labs ordered are listed, but only abnormal results are displayed) Labs Reviewed - No data to  display  EKG   Radiology No results found.  Procedures Procedures (including critical care time)  Medications Ordered in UC Medications - No data to display  Initial Impression / Assessment and Plan / UC Course  I have reviewed the triage vital signs and the nursing notes.  Pertinent labs & imaging results that were available during my care of the patient were reviewed by me and considered in my medical decision making (see chart for details).  70 year old female presenting for 5-day history of cough.  Has already been treated with erythromycin  and prednisone.  Patient with history of asthma and COPD.  Requested chest x-ray.  Negative COVID test.  BP elevated 161/111.  Patient is afebrile.  Oxygen saturation 98%.  She is well-appearing.  On exam she has nasal congestion, posterior pharyngeal erythema with clear nasal drainage and mild wheezing of bilateral upper airways.  No respiratory distress or breathing difficulty.  Chest x-ray ordered.  Patient did not want to wait for results and she is already been here for a couple of hours.  Advised her that I will contact her with results but I did send albuterol inhaler for her.  Checks x-ray independently viewed by me.  Overread confirms normal study.  Call patient to give her the result.  Advised her to continue her prednisone and use the inhaler as needed for any shortness of breath.  Advised over-the-counter cough medication and to follow-up with her PCP regarding her symptoms if she is not improving over the next week or if they worsen.  Reviewed ED precautions as well.  Final Clinical Impressions(s) / UC Diagnoses   Final diagnoses:  Acute cough  Wheezing  Asthma with acute exacerbation, unspecified asthma severity, unspecified whether persistent     Discharge Instructions      -I will call with the results of your chest x-ray when it returns.  If there is any evidence of pneumonia, may need to send an additional antibiotic  for you. - If no evidence of pneumonia, likely a viral illness which should run its course in the next several days.  I have sent an inhaler for you to try since you have a history of asthma and the wheezing.  This should be helpful.  Can take over-the-counter cough medications as needed. - If you run a fever, chest pain or increased breathing difficulty he should be reexamined.  Otherwise, please follow-up with PCP.     ED Prescriptions     Medication Sig Dispense Auth. Provider   albuterol (VENTOLIN HFA) 108 (90 Base) MCG/ACT inhaler Inhale 1-2 puffs into the lungs every 6 (six) hours as needed for wheezing or shortness of breath. 1 g Danton Clap, PA-C      PDMP not reviewed this encounter.   Danton Clap, PA-C 12/12/20 1557

## 2021-01-23 DIAGNOSIS — E782 Mixed hyperlipidemia: Secondary | ICD-10-CM | POA: Diagnosis not present

## 2021-01-23 DIAGNOSIS — E559 Vitamin D deficiency, unspecified: Secondary | ICD-10-CM | POA: Diagnosis not present

## 2021-01-23 DIAGNOSIS — Z79899 Other long term (current) drug therapy: Secondary | ICD-10-CM | POA: Diagnosis not present

## 2021-01-23 DIAGNOSIS — E118 Type 2 diabetes mellitus with unspecified complications: Secondary | ICD-10-CM | POA: Diagnosis not present

## 2021-01-23 DIAGNOSIS — E039 Hypothyroidism, unspecified: Secondary | ICD-10-CM | POA: Diagnosis not present

## 2021-01-26 DIAGNOSIS — E118 Type 2 diabetes mellitus with unspecified complications: Secondary | ICD-10-CM | POA: Diagnosis not present

## 2021-01-26 DIAGNOSIS — I10 Essential (primary) hypertension: Secondary | ICD-10-CM | POA: Diagnosis not present

## 2021-01-26 DIAGNOSIS — E039 Hypothyroidism, unspecified: Secondary | ICD-10-CM | POA: Diagnosis not present

## 2021-01-26 DIAGNOSIS — E782 Mixed hyperlipidemia: Secondary | ICD-10-CM | POA: Diagnosis not present

## 2021-01-27 DIAGNOSIS — D2271 Melanocytic nevi of right lower limb, including hip: Secondary | ICD-10-CM | POA: Diagnosis not present

## 2021-01-27 DIAGNOSIS — L821 Other seborrheic keratosis: Secondary | ICD-10-CM | POA: Diagnosis not present

## 2021-01-27 DIAGNOSIS — D2262 Melanocytic nevi of left upper limb, including shoulder: Secondary | ICD-10-CM | POA: Diagnosis not present

## 2021-01-27 DIAGNOSIS — D2261 Melanocytic nevi of right upper limb, including shoulder: Secondary | ICD-10-CM | POA: Diagnosis not present

## 2021-01-27 DIAGNOSIS — D225 Melanocytic nevi of trunk: Secondary | ICD-10-CM | POA: Diagnosis not present

## 2021-01-27 DIAGNOSIS — D2272 Melanocytic nevi of left lower limb, including hip: Secondary | ICD-10-CM | POA: Diagnosis not present

## 2021-01-27 DIAGNOSIS — L578 Other skin changes due to chronic exposure to nonionizing radiation: Secondary | ICD-10-CM | POA: Diagnosis not present

## 2021-01-29 DIAGNOSIS — G5692 Unspecified mononeuropathy of left upper limb: Secondary | ICD-10-CM | POA: Diagnosis not present

## 2021-02-27 ENCOUNTER — Observation Stay
Admission: EM | Admit: 2021-02-27 | Discharge: 2021-02-27 | Disposition: A | Discharge status: Home / Self Care | Payer: PPO | Attending: Internal Medicine | Admitting: Internal Medicine

## 2021-02-27 ENCOUNTER — Emergency Department: Payer: PPO

## 2021-02-27 ENCOUNTER — Other Ambulatory Visit: Payer: Self-pay

## 2021-02-27 DIAGNOSIS — E119 Type 2 diabetes mellitus without complications: Secondary | ICD-10-CM | POA: Insufficient documentation

## 2021-02-27 DIAGNOSIS — I200 Unstable angina: Secondary | ICD-10-CM | POA: Diagnosis not present

## 2021-02-27 DIAGNOSIS — J449 Chronic obstructive pulmonary disease, unspecified: Secondary | ICD-10-CM | POA: Insufficient documentation

## 2021-02-27 DIAGNOSIS — J45909 Unspecified asthma, uncomplicated: Secondary | ICD-10-CM | POA: Insufficient documentation

## 2021-02-27 DIAGNOSIS — Z9012 Acquired absence of left breast and nipple: Secondary | ICD-10-CM | POA: Diagnosis not present

## 2021-02-27 DIAGNOSIS — I209 Angina pectoris, unspecified: Secondary | ICD-10-CM

## 2021-02-27 DIAGNOSIS — I201 Angina pectoris with documented spasm: Secondary | ICD-10-CM | POA: Diagnosis not present

## 2021-02-27 DIAGNOSIS — Z853 Personal history of malignant neoplasm of breast: Secondary | ICD-10-CM | POA: Diagnosis not present

## 2021-02-27 DIAGNOSIS — Z79899 Other long term (current) drug therapy: Secondary | ICD-10-CM | POA: Diagnosis not present

## 2021-02-27 DIAGNOSIS — R079 Chest pain, unspecified: Secondary | ICD-10-CM | POA: Diagnosis not present

## 2021-02-27 DIAGNOSIS — I10 Essential (primary) hypertension: Secondary | ICD-10-CM | POA: Insufficient documentation

## 2021-02-27 DIAGNOSIS — Z20822 Contact with and (suspected) exposure to covid-19: Secondary | ICD-10-CM | POA: Insufficient documentation

## 2021-02-27 DIAGNOSIS — Z96651 Presence of right artificial knee joint: Secondary | ICD-10-CM | POA: Diagnosis not present

## 2021-02-27 DIAGNOSIS — R0789 Other chest pain: Secondary | ICD-10-CM | POA: Diagnosis not present

## 2021-02-27 LAB — BASIC METABOLIC PANEL
Anion gap: 15 (ref 5–15)
BUN: 14 mg/dL (ref 8–23)
CO2: 25 mmol/L (ref 22–32)
Calcium: 8.8 mg/dL — ABNORMAL LOW (ref 8.9–10.3)
Chloride: 101 mmol/L (ref 98–111)
Creatinine, Ser: 0.64 mg/dL (ref 0.44–1.00)
GFR, Estimated: >60 mL/min (ref >60)
Glucose, Bld: 112 mg/dL — ABNORMAL HIGH (ref 70–99)
Potassium: 3.6 mmol/L (ref 3.5–5.1)
Sodium: 141 mmol/L (ref 135–145)

## 2021-02-27 LAB — CBC
HCT: 37.5 % (ref 36.0–46.0)
Hemoglobin: 12.6 g/dL (ref 12.0–15.0)
MCH: 30.7 pg (ref 26.0–34.0)
MCHC: 33.6 g/dL (ref 30.0–36.0)
MCV: 91.2 fL (ref 80.0–100.0)
Platelets: 155 10*3/uL (ref 150–400)
RBC: 4.11 MIL/uL (ref 3.87–5.11)
RDW: 13.4 % (ref 11.5–15.5)
WBC: 5.3 10*3/uL (ref 4.0–10.5)
nRBC: 0 % (ref 0.0–0.2)

## 2021-02-27 LAB — TROPONIN I (HIGH SENSITIVITY)
Troponin I (High Sensitivity): 13 ng/L (ref <18)
Troponin I (High Sensitivity): 41 ng/L — ABNORMAL HIGH (ref <18)

## 2021-02-27 LAB — RESP PANEL BY RT-PCR (FLU A&B, COVID) ARPGX2
Influenza A by PCR: NEGATIVE
Influenza B by PCR: NEGATIVE
SARS Coronavirus 2 by RT PCR: NEGATIVE

## 2021-02-27 MED ORDER — SODIUM CHLORIDE 0.9 % IV SOLN
12.5000 mg | Freq: Once | INTRAVENOUS | Status: AC
  Administered 2021-02-27: 12.5 mg via INTRAVENOUS
  Filled 2021-02-27: qty 12.5

## 2021-02-27 MED ORDER — ACETAMINOPHEN 650 MG RE SUPP: 650.0000 mg | Freq: Four times a day (QID) | RECTAL | Status: DC | PRN

## 2021-02-27 MED ORDER — ENOXAPARIN SODIUM 40 MG/0.4ML IJ SOSY: 40.0000 mg | PREFILLED_SYRINGE | INTRAMUSCULAR | Status: DC

## 2021-02-27 MED ORDER — MORPHINE SULFATE (PF) 2 MG/ML IV SOLN: 1.0000 mg | INTRAVENOUS | Status: DC | PRN

## 2021-02-27 MED ORDER — ACETAMINOPHEN 325 MG PO TABS: 650.0000 mg | ORAL_TABLET | Freq: Four times a day (QID) | ORAL | Status: DC | PRN

## 2021-02-27 MED ORDER — HYDROCODONE-ACETAMINOPHEN 5-325 MG PO TABS: 1.0000 | ORAL_TABLET | ORAL | Status: DC | PRN

## 2021-02-27 MED ORDER — SODIUM CHLORIDE 0.9% FLUSH: 3.0000 mL | Freq: Two times a day (BID) | INTRAVENOUS | Status: DC

## 2021-02-27 MED ORDER — ONDANSETRON HCL 4 MG/2ML IJ SOLN: 4.0000 mg | Freq: Four times a day (QID) | INTRAMUSCULAR | Status: DC | PRN

## 2021-02-27 MED ORDER — POLYETHYLENE GLYCOL 3350 17 G PO PACK: 17.0000 g | PACK | Freq: Every day | ORAL | Status: DC | PRN

## 2021-02-27 MED ORDER — ENOXAPARIN SODIUM 60 MG/0.6ML IJ SOSY: 0.5000 mg/kg | PREFILLED_SYRINGE | INTRAMUSCULAR | Status: DC

## 2021-02-27 MED ORDER — ONDANSETRON HCL 4 MG PO TABS: 4.0000 mg | ORAL_TABLET | Freq: Four times a day (QID) | ORAL | Status: DC | PRN

## 2021-02-27 NOTE — Progress Notes (Signed)
PHARMACIST - PHYSICIAN COMMUNICATION  CONCERNING:  Enoxaparin (Lovenox) for DVT Prophylaxis    RECOMMENDATION: Patient was prescribed enoxaprin 40mg  q24 hours for VTE prophylaxis.   Filed Weights   02/27/21 1258  Weight: 106.6 kg (235 lb)    Body mass index is 40.34 kg/m.  Estimated Creatinine Clearance: 75.8 mL/min (by C-G formula based on SCr of 0.64 mg/dL).   Based on [place] patient is candidate for enoxaparin 0.5mg /kg TBW SQ every 24 hours based on BMI being >30.  DESCRIPTION: Pharmacy has adjusted enoxaparin dose per [facility] policy.  Patient is now receiving enoxaparin 52.5 mg every 24 hours    Berta Minor, PharmD Clinical Pharmacist  02/27/2021 4:27 PM

## 2021-02-27 NOTE — Consult Note (Signed)
History and Physical    RAYEN PALEN JKK:[id number] DOB: [birth year] DOA: 02/27/2021  PCP: Idelle Crouch, MD  Patient coming from: home    Chief Complaint: chest pain   HPI: 71 y/o F w/ PMH of HTN, HLD, PUD, diverticulitis, obesity, asthma, breast cancer (in remission) who presents w/ chest pressure x 10 am of the morning of admission. The chest pressure is dull, intermittent w/ radiation to right arm & left arm numbness. Pt took aspirin at home w/o any relief. Nitro did relieve chest pressure. Pt denies ever having above stated symptoms before. Pt denies any personal hx of CAD, CHF or arrhythmias but pt's father had a massive MI at the age of 11. Pt denies any fevers, chills, sweating, cough, shortness of breath, vomiting, abd pain, dysuria, urinary urgency, diarrhea, or constipation.    Review of Systems: As per HPI otherwise 14 point review of systems negative.    Past Medical History:  Diagnosis Date   Abdominal pain, left lower quadrant 2012   Abnormal LFTs    Anemia    vitamin d deficiency   Arthritis    Asthma    Back pain 2005   lower back arthritis   Bone cancer ([place])    Breast cancer ([place]) 04/2018   left invasive mammary   Cancer ([place]) 04/26/2018   103mm, T1c, N0 triple negative   Chronic cholecystitis 2012   has had choleycystectomy.  not an issue   Chronic insomnia    Constipation 2005   COPD (chronic obstructive pulmonary disease) (HCC)    Degenerative disc disease, cervical    Diabetes mellitus without complication ([place])    Diverticulitis    Endocrine problem 2005   Family history of bone cancer    Family history of brain cancer    Family history of colon cancer    Family history of lung cancer    GERD (gastroesophageal reflux disease) 2012   History of GI diverticular bleed    History of peptic ulcer disease    History of pneumonia    Hyperlipidemia 2012   Hypertension 2002   Nausea with vomiting 2012   Obesity, unspecified 2012   Personal history  of chemotherapy    Personal history of radiation therapy 2020   invasive mammary   Pneumonia    PONV (postoperative nausea and vomiting)    Special screening for malignant neoplasms, colon 2012   Vitamin D deficiency     Past Surgical History:  Procedure Laterality Date   BILATERAL CARPAL TUNNEL RELEASE Bilateral    BREAST BIOPSY Left 04/13/2018   invasive mamm and DCIS   BREAST BIOPSY Right 04/10/2019   calcs bx, ribbon marker, benign    BREAST BIOPSY Left 04/03/2019   LN bx with Dr Bary Castilla, FEATURES COMPATIBLE WITH PRIOR RADIATION THERAPY   BREAST EXCISIONAL BIOPSY Left early 90s   neg   BREAST LUMPECTOMY Left 04/26/2018   Procedure: BREAST LUMPECTOMY WITH EXCISION OF SENTINEL NODE;  Surgeon: Robert Bellow, MD;  Location: [facility];  Service: General;  Laterality: Left;   BUNIONECTOMY Bilateral 2003   CHOLECYSTECTOMY  06/04/2010   COLON RESECTION  2004   due diverticulitis    COLON SURGERY     COLONOSCOPY  2005   [place], Dr. Bary Castilla   COLONOSCOPY  06/28/2012   COLONOSCOPY WITH PROPOFOL N/A 11/09/2019   Procedure: COLONOSCOPY WITH PROPOFOL;  Surgeon: Robert Bellow, MD;  Location: [facility];  Service: Endoscopy;  Laterality: N/A;   COLOSTOMY  2004  COLOSTOMY REVERSAL  2004   FOOT SURGERY Bilateral 2012   plantar faciatis   HAND SURGERY Bilateral    carpal tunnel   HERNIA REPAIR  2005   at colostomy site after reversal done   JOINT REPLACEMENT     KNEE ARTHROPLASTY Right 04/20/2017   Procedure: COMPUTER ASSISTED TOTAL KNEE ARTHROPLASTY;  Surgeon: Dereck Leep, MD;  Location: [facility];  Service: Orthopedics;  Laterality: Right;   KNEE ARTHROSCOPY Right 02/03/2015   Procedure: ARTHROSCOPY right knee, partial medial menisectomy, condyle malleolus, patella and femoral;  Surgeon: Dereck Leep, MD;  Location: [facility];  Service: Orthopedics;  Laterality: Right;   MASTECTOMY Left    partial lumpectomy'   NASAL SINUS SURGERY     2005   PORTACATH PLACEMENT Right  04/26/2018   Procedure: INSERTION PORT-A-CATH RIGHT;  Surgeon: Robert Bellow, MD;  Location: [facility];  Service: General;  Laterality: Right;   SALPINGOOPHORECTOMY  1998   status post colectomy     TONSILLECTOMY     TUBAL LIGATION  1978   UPPER GI ENDOSCOPY  06/28/2012   VAGINAL HYSTERECTOMY  1987     reports that she has never smoked. She has never used smokeless tobacco. She reports that she does not currently use alcohol. She reports that she does not use drugs.  Allergies  Allergen Reactions   Codeine Other (See Comments)    Goes to sleep and unable to wake up.   Penicillins Hives    Did it involve swelling of the face/tongue/throat, SOB, or low BP? No Did it involve sudden or severe rash/hives, skin peeling, or any reaction on the inside of your mouth or nose? No Did you need to seek medical attention at a hospital or doctor's office? Yes When did it last happen?      50s If all above answers are "NO", may proceed with cephalosporin use.    Sudafed [Pseudoephedrine]     "Heart speeds up"   Adhesive [Tape] Dermatitis    Skin irritation    Family History  Problem Relation Age of Onset   Heart disease Father    Diabetes Paternal Grandmother    Diabetes Paternal Grandfather    Brain cancer Mother 63   Bone cancer Sister 43   Colon cancer Maternal Uncle        dx 50s-60s   Throat cancer Cousin    Leukemia Cousin    Lung cancer Cousin    Ovarian cancer Neg Hx    Breast cancer Neg Hx    Prostate cancer Neg Hx    Kidney cancer Neg Hx    Bladder Cancer Neg Hx      Prior to Admission medications   Medication Sig Start Date End Date Taking? Authorizing Provider  albuterol (VENTOLIN HFA) 108 (90 Base) MCG/ACT inhaler Inhale 1-2 puffs into the lungs every 6 (six) hours as needed for wheezing or shortness of breath. 12/12/20   Laurene Footman B, PA-C  amLODipine (NORVASC) 5 MG tablet Take 5 mg by mouth daily.    [provider]  atorvastatin (LIPITOR) 10 MG  tablet Take 10 mg by mouth daily.    [provider]  Calcium Carbonate-Vit D-Min (CALCIUM 1200 PO) Take by mouth.    [provider]  CINNAMON PO Take by mouth. Patient not taking: No sig reported    [provider]  doxycycline (VIBRAMYCIN) 100 MG capsule Take 100 mg by mouth 2 (two) times daily. 07/08/20   [provider]  gabapentin (NEURONTIN) 300 MG capsule Take 2 capsules (600 mg total) by mouth 2 (two) times daily. 11/14/20   Earlie Server, MD  hydrochlorothiazide (HYDRODIURIL) 25 MG tablet Take 25 mg by mouth daily. 11/28/17   [provider]  lansoprazole (PREVACID) 30 MG capsule Take 30 mg by mouth 2 (two) times daily.     [provider]  levothyroxine (SYNTHROID, LEVOTHROID) 50 MCG tablet Take 50 mcg by mouth daily before breakfast.  03/03/18   [provider]  loratadine (CLARITIN) 10 MG tablet Take 10 mg by mouth daily.    [provider]  losartan (COZAAR) 100 MG tablet Take 100 mg by mouth daily.    [provider]  Multiple Vitamins-Minerals (VITAMIN D3 COMPLETE PO) Take by mouth.    [provider]  Potassium 99 MG TABS Take by mouth daily.    [provider]  propranolol (INDERAL) 10 MG tablet Take 10 mg by mouth 2 (two) times daily. Tablet(s) By Mouth Twice Daily Patient not taking: Reported on 11/14/2020 11/12/20   [provider]  Zinc Sulfate (ZINC 15 PO) Take by mouth.    [provider]  zolpidem (AMBIEN) 10 MG tablet Take 10 mg by mouth at bedtime.    [provider]    Physical Exam: Vitals:   02/27/21 1424 02/27/21 1430 02/27/21 1500 02/27/21 1530  BP: (!) 151/83 (!) 151/69 (!) 161/68 (!) 160/66  Pulse: 62 63 64 65  Resp: 20   20  Temp:      TempSrc:      SpO2: 97%   97%  Weight:      Height:        Constitutional: NAD, calm, comfortable Vitals:   02/27/21 1424 02/27/21 1430 02/27/21 1500 02/27/21 1530  BP: (!) 151/83 (!) 151/69 (!)  161/68 (!) 160/66  Pulse: 62 63 64 65  Resp: 20   20  Temp:      TempSrc:      SpO2: 97%   97%  Weight:      Height:       Eyes: PERRL, lids and conjunctivae normal ENMT: Mucous membranes are moist.   Neck: normal, supple Respiratory: clear to auscultation bilaterally, no wheezing, no crackles. Normal respiratory effort. No accessory muscle use.  Cardiovascular: Regular rate and rhythm, no rubs / gallops. No extremity edema.   Abdomen:  soft, obese, no tenderness, bowel sounds positive.  Musculoskeletal: no clubbing / cyanosis. No joint deformity upper and lower extremities. Good ROM, no contractures. Normal muscle tone.  Skin: no rashes, lesions.  Neurologic: CN 2-12 grossly intact. Moves all extremities  Psychiatric: Normal judgment and insight. Alert and oriented x 3. Normal mood.    Labs on Admission: I have personally reviewed following labs and imaging studies  CBC: Recent Labs  Lab 02/27/21 1313  WBC 5.3  HGB 12.6  HCT 37.5  MCV 91.2  PLT 379   Basic Metabolic Panel: Recent Labs  Lab 02/27/21 1313  NA 141  K 3.6  CL 101  CO2 25  GLUCOSE 112*  BUN 14  CREATININE 0.64  CALCIUM 8.8*   GFR: Estimated Creatinine Clearance: 75.8 mL/min (by C-G formula based on SCr of 0.64 mg/dL). Liver Function Tests: No results for input(s): AST, ALT, ALKPHOS, BILITOT, PROT, ALBUMIN in the last 168 hours. No results for input(s): LIPASE, AMYLASE in the last 168 hours. No results for input(s): AMMONIA in the last 168 hours. Coagulation Profile: No results for input(s): INR, PROTIME in  the last 168 hours. Cardiac Enzymes: No results for input(s): CKTOTAL, CKMB, CKMBINDEX, TROPONINI in the last 168 hours. BNP (last 3 results) No results for input(s): PROBNP in the last 8760 hours. HbA1C: No results for input(s): HGBA1C in the last 72 hours. CBG: No results for input(s): GLUCAP in the last 168 hours. Lipid Profile: No results for input(s): CHOL, HDL, LDLCALC, TRIG,  CHOLHDL, LDLDIRECT in the last 72 hours. Thyroid Function Tests: No results for input(s): TSH, T4TOTAL, FREET4, T3FREE, THYROIDAB in the last 72 hours. Anemia Panel: No results for input(s): VITAMINB12, FOLATE, FERRITIN, TIBC, IRON, RETICCTPCT in the last 72 hours. Urine analysis:    Component Value Date/Time   COLORURINE YELLOW (A) 10/09/2018 1100   APPEARANCEUR Cloudy (A) 11/27/2019 0945   LABSPEC 1.018 10/09/2018 1100   PHURINE 6.0 10/09/2018 1100   GLUCOSEU Negative 11/27/2019 0945   HGBUR NEGATIVE 10/09/2018 1100   BILIRUBINUR Negative 11/27/2019 0945   KETONESUR NEGATIVE 10/09/2018 1100   PROTEINUR Negative 11/27/2019 0945   PROTEINUR 30 (A) 10/09/2018 1100   UROBILINOGEN negative 03/02/2016 1330   NITRITE Negative 11/27/2019 0945   NITRITE NEGATIVE 10/09/2018 1100   LEUKOCYTESUR Negative 11/27/2019 0945   LEUKOCYTESUR TRACE (A) 10/09/2018 1100    Radiological Exams on Admission: DG Chest Portable 1 View  Result Date: 02/27/2021 CLINICAL DATA:  Chest pain. EXAM: PORTABLE CHEST 1 VIEW COMPARISON:  December 12, 2020. FINDINGS: The heart size and mediastinal contours are within normal limits. Both lungs are clear. The visualized skeletal structures are unremarkable. IMPRESSION: No active disease. Electronically Signed   By: Marijo Conception M.D.   On: 02/27/2021 14:59    EKG: Independently reviewed.   Assessment/Plan Principal Problem:   Chest pain  Chest pain: unlikely cardiac in etiology. Troponin minimally elevated. Discussed pt w/ cardio, Dr. Saralyn Pilar, who states pt can been seen as an outpatient for further evaluation and no further inpatient cardiac work-up is indicated currently. So pt was d/c home on 02/26/21.   Morbid obesity: BMI 40.3. Would benefit from weight loss   HTN: continue on home dose of amlodipine, losartan, HCTZ  HLD: continue on statin   Hypothyroidism: continue on home dose of levothyroxine   GERD: continue on PPI      DVT prophylaxis:  lovenox  Code Status: full  Family Communication: discussed pt's care w/ pt's husband at bedside and answered his questions  Disposition Plan: likely d/c back home  Consults called: curbside cardio, Dr. Saralyn Pilar  Admission status: was placed in observation but then d/c home after discussing the pt with cardio     Wyvonnia Dusky MD [facility]  If 7PM-7AM, please contact night-coverage    02/27/2021, 5:12 PM

## 2021-02-27 NOTE — ED Notes (Signed)
Lav, lt grn, blue tubes sent to lab.

## 2021-02-27 NOTE — ED Provider Notes (Signed)
[facility] Provider Note    Event Date/Time   First MD Initiated Contact with Patient 02/27/21 1427     (approximate)   History   Chest Pain   HPI  Megan Harris is a 71 y.o. female who had about an hour worth of chest pain and tightness radiating to the left shoulder and felt like she was short of breath.  She did not have any nausea or diaphoresis but felt like she was going to pass out.  She got nitro and felt better.  She never had anything like this before.  She has a history of hypertension.      Physical Exam   Triage Vital Signs: ED Triage Vitals  Enc Vitals Group     BP 02/27/21 1255 132/70     Pulse Rate 02/27/21 1255 70     Resp 02/27/21 1255 20     Temp 02/27/21 1255 98 F (36.7 C)     Temp Source 02/27/21 1255 Oral     SpO2 02/27/21 1255 95 %     Weight 02/27/21 1258 235 lb (106.6 kg)     Height 02/27/21 1258 5\' 4"  (1.626 m)     Head Circumference --      Peak Flow --      Pain Score 02/27/21 1258 5     Pain Loc --      Pain Edu? --      Excl. in [place]? --     Most recent vital signs: Vitals:   02/27/21 1500 02/27/21 1530  BP: (!) 161/68 (!) 160/66  Pulse: 64 65  Resp:  20  Temp:    SpO2:  97%     General: Awake, no distress.  CV:  Good peripheral perfusion.  Lungs are clear Resp:  Normal effort.  Heart regular rate and rhythm no audible murmurs Abd:  No distention.  Abdomen is soft bowel sounds positive nontender Extremities with no edema or tenderness   ED Results / Procedures / Treatments   Labs (all labs ordered are listed, but only abnormal results are displayed) Labs Reviewed  BASIC METABOLIC PANEL - Abnormal; Notable for the following components:      Result Value   Glucose, Bld 112 (*)    Calcium 8.8 (*)    All other components within normal limits  TROPONIN I (HIGH SENSITIVITY) - Abnormal; Notable for the following components:   Troponin I (High Sensitivity) 41 (*)    All other components within  normal limits  RESP PANEL BY RT-PCR (FLU A&B, COVID) ARPGX2  CBC  TROPONIN I (HIGH SENSITIVITY)     EKG  Initial EKG read interpreted by me shows normal sinus rhythm rate of 68 left axis decreased R wave progression but otherwise no acute changes EKG #2 shows normal sinus rhythm at 64 left axis again poor R wave progression but no acute changes RADIOLOGY Chest x-ray read by radiology reviewed by me is negative   PROCEDURES:  Critical Care performed:   Procedures   MEDICATIONS ORDERED IN ED: Medications  enoxaparin (LOVENOX) injection 40 mg (has no administration in time range)  sodium chloride flush (NS) 0.9 % injection 3 mL (has no administration in time range)  acetaminophen (TYLENOL) tablet 650 mg (has no administration in time range)    Or  acetaminophen (TYLENOL) suppository 650 mg (has no administration in time range)  HYDROcodone-acetaminophen (NORCO/VICODIN) 5-325 MG per tablet 1-2 tablet (has no administration in time range)  morphine (PF)  2 MG/ML injection 1 mg (has no administration in time range)  polyethylene glycol (MIRALAX / GLYCOLAX) packet 17 g (has no administration in time range)  ondansetron (ZOFRAN) tablet 4 mg (has no administration in time range)    Or  ondansetron (ZOFRAN) injection 4 mg (has no administration in time range)  promethazine (PHENERGAN) 12.5 mg in sodium chloride 0.9 % 50 mL IVPB (0 mg Intravenous Stopped 02/27/21 1502)     IMPRESSION / MDM / ASSESSMENT AND PLAN / ED COURSE  I reviewed the triage vital signs and the nursing notes. History is somewhat suggestive of angina initial episode.  Patient improved with nitro and her troponin went from 13-41.  While this is not a marked increase it is still more than I would have expected.  I think it may be worthwhile to bring this patient in and evaluate her for new onset angina.  She is x-ray is clear.  The history does not appear to be consistent with pulmonary emboli or reflux.  It is not  consistent with pleurisy either.    The patient is on the cardiac monitor to evaluate for evidence of arrhythmia and/or significant heart rate changes.    FINAL CLINICAL IMPRESSION(S) / ED DIAGNOSES   Final diagnoses:  Chest pain, unspecified type  New-onset angina ([place])     Rx / DC Orders   ED Discharge Orders     None        Note:  This document was prepared using Dragon voice recognition software and may include unintentional dictation errors.   Nena Polio, MD 02/27/21 [phone]

## 2021-02-27 NOTE — ED Triage Notes (Signed)
Pt in from home via EMS due to chest pain and shoulder blade/L arm tingling starting around 10am this morning; BP 190/110 then 158/64 post 1 round of nitro spray by EMS; pt took 324mg  ASA this morning at 10am; CBG 141; states did not take her daily meds yet today; 18g R ac by EMS. Pt A&Ox4; resp reg/unlabored; pt reports "I feel like I can't take a really deep breath but I need to" and states "my heart speeds up real fast and then I don't feel it" and states "chest pressure". Pt denies diaphoresis but states felt like was going to pass out. Pressure remains per pt post nitro but states generally feels better.

## 2021-02-27 NOTE — ED Notes (Signed)
EKG to EDP Kinner in person.

## 2021-02-27 NOTE — ED Notes (Signed)
Pt assisted to toilet 

## 2021-03-02 ENCOUNTER — Other Ambulatory Visit: Payer: Self-pay | Admitting: General Surgery

## 2021-03-02 DIAGNOSIS — R079 Chest pain, unspecified: Secondary | ICD-10-CM | POA: Diagnosis not present

## 2021-03-02 DIAGNOSIS — E782 Mixed hyperlipidemia: Secondary | ICD-10-CM | POA: Diagnosis not present

## 2021-03-02 DIAGNOSIS — I10 Essential (primary) hypertension: Secondary | ICD-10-CM | POA: Diagnosis not present

## 2021-03-02 DIAGNOSIS — C50212 Malignant neoplasm of upper-inner quadrant of left female breast: Secondary | ICD-10-CM

## 2021-03-02 DIAGNOSIS — E118 Type 2 diabetes mellitus with unspecified complications: Secondary | ICD-10-CM | POA: Diagnosis not present

## 2021-03-02 DIAGNOSIS — Z171 Estrogen receptor negative status [ER-]: Secondary | ICD-10-CM

## 2021-03-02 DIAGNOSIS — R0602 Shortness of breath: Secondary | ICD-10-CM | POA: Diagnosis not present

## 2021-03-04 DIAGNOSIS — R079 Chest pain, unspecified: Secondary | ICD-10-CM | POA: Diagnosis not present

## 2021-03-04 DIAGNOSIS — R0602 Shortness of breath: Secondary | ICD-10-CM | POA: Diagnosis not present

## 2021-03-13 DIAGNOSIS — R0602 Shortness of breath: Secondary | ICD-10-CM | POA: Diagnosis not present

## 2021-03-13 DIAGNOSIS — R079 Chest pain, unspecified: Secondary | ICD-10-CM | POA: Diagnosis not present

## 2021-03-13 DIAGNOSIS — E118 Type 2 diabetes mellitus with unspecified complications: Secondary | ICD-10-CM | POA: Diagnosis not present

## 2021-03-13 DIAGNOSIS — E782 Mixed hyperlipidemia: Secondary | ICD-10-CM | POA: Diagnosis not present

## 2021-03-16 ENCOUNTER — Other Ambulatory Visit: Payer: PPO

## 2021-03-18 ENCOUNTER — Ambulatory Visit: Payer: PPO | Admitting: Oncology

## 2021-03-23 ENCOUNTER — Other Ambulatory Visit: Payer: Self-pay

## 2021-03-23 ENCOUNTER — Inpatient Hospital Stay: Payer: PPO | Attending: Oncology

## 2021-03-23 DIAGNOSIS — Z171 Estrogen receptor negative status [ER-]: Secondary | ICD-10-CM | POA: Insufficient documentation

## 2021-03-23 DIAGNOSIS — Z9221 Personal history of antineoplastic chemotherapy: Secondary | ICD-10-CM | POA: Insufficient documentation

## 2021-03-23 DIAGNOSIS — R7401 Elevation of levels of liver transaminase levels: Secondary | ICD-10-CM | POA: Diagnosis not present

## 2021-03-23 DIAGNOSIS — Z923 Personal history of irradiation: Secondary | ICD-10-CM | POA: Insufficient documentation

## 2021-03-23 DIAGNOSIS — Z79899 Other long term (current) drug therapy: Secondary | ICD-10-CM | POA: Insufficient documentation

## 2021-03-23 DIAGNOSIS — C50212 Malignant neoplasm of upper-inner quadrant of left female breast: Secondary | ICD-10-CM | POA: Diagnosis not present

## 2021-03-23 DIAGNOSIS — G629 Polyneuropathy, unspecified: Secondary | ICD-10-CM | POA: Diagnosis not present

## 2021-03-23 DIAGNOSIS — K760 Fatty (change of) liver, not elsewhere classified: Secondary | ICD-10-CM | POA: Insufficient documentation

## 2021-03-23 DIAGNOSIS — C50919 Malignant neoplasm of unspecified site of unspecified female breast: Secondary | ICD-10-CM

## 2021-03-23 LAB — COMPREHENSIVE METABOLIC PANEL
ALT: 57 U/L — ABNORMAL HIGH (ref 0–44)
AST: 51 U/L — ABNORMAL HIGH (ref 15–41)
Albumin: 3.8 g/dL (ref 3.5–5.0)
Alkaline Phosphatase: 46 U/L (ref 38–126)
Anion gap: 10 (ref 5–15)
BUN: 16 mg/dL (ref 8–23)
CO2: 28 mmol/L (ref 22–32)
Calcium: 8.8 mg/dL — ABNORMAL LOW (ref 8.9–10.3)
Chloride: 98 mmol/L (ref 98–111)
Creatinine, Ser: 0.75 mg/dL (ref 0.44–1.00)
GFR, Estimated: >60 mL/min (ref >60)
Glucose, Bld: 121 mg/dL — ABNORMAL HIGH (ref 70–99)
Potassium: 3.7 mmol/L (ref 3.5–5.1)
Sodium: 136 mmol/L (ref 135–145)
Total Bilirubin: 0.6 mg/dL (ref 0.3–1.2)
Total Protein: 6.8 g/dL (ref 6.5–8.1)

## 2021-03-23 LAB — CBC WITH DIFFERENTIAL/PLATELET
Abs Immature Granulocytes: 0.03 10*3/uL (ref 0.00–0.07)
Basophils Absolute: 0 10*3/uL (ref 0.0–0.1)
Basophils Relative: 1 %
Eosinophils Absolute: 0.2 10*3/uL (ref 0.0–0.5)
Eosinophils Relative: 2 %
HCT: 40.8 % (ref 36.0–46.0)
Hemoglobin: 13.5 g/dL (ref 12.0–15.0)
Immature Granulocytes: 1 %
Lymphocytes Relative: 30 %
Lymphs Abs: 2 10*3/uL (ref 0.7–4.0)
MCH: 30.1 pg (ref 26.0–34.0)
MCHC: 33.1 g/dL (ref 30.0–36.0)
MCV: 90.9 fL (ref 80.0–100.0)
Monocytes Absolute: 0.5 10*3/uL (ref 0.1–1.0)
Monocytes Relative: 7 %
Neutro Abs: 4 10*3/uL (ref 1.7–7.7)
Neutrophils Relative %: 59 %
Platelets: 184 10*3/uL (ref 150–400)
RBC: 4.49 MIL/uL (ref 3.87–5.11)
RDW: 13.7 % (ref 11.5–15.5)
WBC: 6.7 10*3/uL (ref 4.0–10.5)
nRBC: 0 % (ref 0.0–0.2)

## 2021-03-24 DIAGNOSIS — Z0000 Encounter for general adult medical examination without abnormal findings: Secondary | ICD-10-CM | POA: Diagnosis not present

## 2021-03-24 DIAGNOSIS — E118 Type 2 diabetes mellitus with unspecified complications: Secondary | ICD-10-CM | POA: Diagnosis not present

## 2021-03-24 DIAGNOSIS — E782 Mixed hyperlipidemia: Secondary | ICD-10-CM | POA: Diagnosis not present

## 2021-03-24 DIAGNOSIS — Z79899 Other long term (current) drug therapy: Secondary | ICD-10-CM | POA: Diagnosis not present

## 2021-03-24 DIAGNOSIS — I10 Essential (primary) hypertension: Secondary | ICD-10-CM | POA: Diagnosis not present

## 2021-03-24 DIAGNOSIS — E039 Hypothyroidism, unspecified: Secondary | ICD-10-CM | POA: Diagnosis not present

## 2021-03-24 DIAGNOSIS — Z6841 Body Mass Index (BMI) 40.0 and over, adult: Secondary | ICD-10-CM | POA: Diagnosis not present

## 2021-03-24 LAB — CANCER ANTIGEN 27.29: CA 27.29: 22.8 U/mL (ref 0.0–38.6)

## 2021-03-24 LAB — CANCER ANTIGEN 15-3: CA 15-3: 19 U/mL (ref 0.0–25.0)

## 2021-03-25 ENCOUNTER — Other Ambulatory Visit: Payer: Self-pay

## 2021-03-25 ENCOUNTER — Encounter: Payer: Self-pay | Admitting: Oncology

## 2021-03-25 ENCOUNTER — Inpatient Hospital Stay: Payer: PPO | Admitting: Oncology

## 2021-03-25 VITALS — BP 139/77 | HR 71 | Temp 96.6°F | Wt 243.0 lb

## 2021-03-25 DIAGNOSIS — R7401 Elevation of levels of liver transaminase levels: Secondary | ICD-10-CM

## 2021-03-25 DIAGNOSIS — C50919 Malignant neoplasm of unspecified site of unspecified female breast: Secondary | ICD-10-CM

## 2021-03-25 DIAGNOSIS — G629 Polyneuropathy, unspecified: Secondary | ICD-10-CM | POA: Diagnosis not present

## 2021-03-25 DIAGNOSIS — C50212 Malignant neoplasm of upper-inner quadrant of left female breast: Secondary | ICD-10-CM | POA: Diagnosis not present

## 2021-03-25 MED ORDER — GABAPENTIN 300 MG PO CAPS: 600.0000 mg | ORAL_CAPSULE | Freq: Three times a day (TID) | ORAL | 1 refills | Status: DC

## 2021-03-25 NOTE — Progress Notes (Signed)
Hematology/Oncology Progress note [phone] [phone]      Patient Care Team: Idelle Crouch, MD as PCP - General (Internal Medicine) Bary Castilla, Forest Gleason, MD as Consulting Physician (General Surgery) Theodore Demark, RN as Oncology Nurse Navigator Noreene Filbert, MD as Referring Physician (Radiation Oncology) Earlie Server, MD as Consulting Physician (Oncology)  REFERRING PROVIDER: Idelle Crouch, MD REASON FOR VISIT:  Follow up for adjuvant chemotherapy for breast cancer.   HISTORY OF PRESENTING ILLNESS:  Megan Harris is a  71 y.o.  female present for management of  breast cancer  # Stage IB left triple negative breast cancer   03/27/2018 which showed left breast mass. She had diagnostic and ultrasound on 04/07/2018 which showed 0.9cm x 0.9cm x 1.4cm irregular border mixed echotexture mass at the left breast 11 o'clock 7 cm from nipple correlating to the mammographic finding. Ultrasound of the left axilla is negative She underwent biopsy of left breast mass. 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 Estrogen receptor negative, progesterone receptor negative, HER-2 negative.  S/p left lumpectomy and sentinel lymph node biopsy. pT1pN0 05/09/2018 Baseline MUGA showed no focal wall motion abnormality of the left ventricle.  Calculated LVEF 61%  # 05/24/2018- 07/05/2018 Adjuvant Chemotherapy ddAC- # 07/19/2018 started on weekly Taxol 80 mg/m. Starting cycle 4, Taxol was decreased to 10m/m2 # 08/23/2018 Cycle cycle 6, Taxol was decreased to 65 mg/m due to neuropathy. 10/04/2018 patient finished adjuvant chemotherapy with ddAC x 4 followed by 12 weekly Taxol. 12/13/2018, she finished adjuvant radiation.  #Patient was recommended for genetic testing and she declined. Mediport was discontinued per patient's preference  # 03/29/2019 patient underwent annual diagnostic  breast mammogram bilaterally. Mammogram showed new calcifications in the superior lateral right breast spanning 5 mm. Also there is an abnormal lymph node in the low left axilla representing a change from previous mammogram. 04/03/2019 patient underwent left axillary lymph node biopsy and pathology is negative for malignancy, fibroadipose tissue with necrosis and calcifications, compatible with prior radiation therapy.  Lymph node tissue is not identified. 04/10/2019 breast right upper outer quadrant biopsy showed fibroadenomatoid changes with associated calcifications.  Negative for atypia and malignancy.  Seen by Dr. BBary Castillarecently.  10/23/2019 CT abdomen pelvis was obtained for evaluation of pelvic pain.CT showed mild circumferential bladder wall thickening, she follows with urologist and there is a plan for cystoscopy per patient.  CT also showed hepatic steatosis, 2.1 cm right adnexal cystic mass.  # 11/09/2019, colonoscopy was performed by Dr. BBary Castillawhich showed diverticulosis in the rectosigmoid colon.  2 colon polyps was resected and retrieved.   01/21/2020 ultrasound pelvic showed small simple cyst in the right adnexa.  Patient was seen by Dr. HKenton Kingfishergynecology  INTERVAL HISTORY Megan RAYMONDis a 71y.o. female who has above history reviewed by me presents for follow-up of stage I left triple negative breast cancer Patient reports feeling well.  She has appointment with surgery Dr. BBary Castilla  She also has a mammogram scheduled Denies any breast concerns.  Denies any bone pain. Review of Systems  Constitutional:  Negative for chills and fever.  HENT:  Negative for hearing loss.   Respiratory:  Negative for cough.   Cardiovascular:  Negative for chest pain.  Gastrointestinal:  Negative for abdominal pain, blood in stool and nausea.  Genitourinary:  Negative for frequency and urgency.  Skin:  Negative for itching and rash.  Neurological:  Negative for  focal weakness.   Psychiatric/Behavioral:  The patient is not nervous/anxious.    MEDICAL HISTORY:  Past Medical History:  Diagnosis Date   Abdominal pain, left lower quadrant 2012   Abnormal LFTs    Anemia    vitamin d deficiency   Arthritis    Asthma    Back pain 2005   lower back arthritis   Bone cancer ([place])    Breast cancer ([place]) 04/2018   left invasive mammary   Cancer ([place]) 04/26/2018   35m, T1c, N0 triple negative   Chronic cholecystitis 2012   has had choleycystectomy.  not an issue   Chronic insomnia    Constipation 2005   COPD (chronic obstructive pulmonary disease) (H[facility])    Degenerative disc disease, cervical    Diabetes mellitus without complication ([place]    Diverticulitis    Endocrine problem 2005   Family history of bone cancer    Family history of brain cancer    Family history of colon cancer    Family history of lung cancer    GERD (gastroesophageal reflux disease) 2012   History of GI diverticular bleed    History of peptic ulcer disease    History of pneumonia    Hyperlipidemia 2012   Hypertension 2002   Nausea with vomiting 2012   Obesity, unspecified 2012   Personal history of chemotherapy    Personal history of radiation therapy 2020   invasive mammary   Pneumonia    PONV (postoperative nausea and vomiting)    Special screening for malignant neoplasms, colon 2012   Vitamin D deficiency     SURGICAL HISTORY: Past Surgical History:  Procedure Laterality Date   BILATERAL CARPAL TUNNEL RELEASE Bilateral    BREAST BIOPSY Left 04/13/2018   invasive mamm and DCIS   BREAST BIOPSY Right 04/10/2019   calcs bx, ribbon marker, benign    BREAST BIOPSY Left 04/03/2019   LN bx with Dr BBary Castilla FEATURES COMPATIBLE WITH PRIOR RADIATION THERAPY   BREAST EXCISIONAL BIOPSY Left early 90s   neg   BREAST LUMPECTOMY Left 04/26/2018   Procedure: BREAST LUMPECTOMY WITH EXCISION OF SENTINEL NODE;  Surgeon: BRobert Bellow MD;  Location: [facility];  Service: General;   Laterality: Left;   BUNIONECTOMY Bilateral 2003   CHOLECYSTECTOMY  06/04/2010   COLON RESECTION  2004   due diverticulitis    COLON SURGERY     [place]  COLONOSCOPY  06/28/2012   COLONOSCOPY WITH PROPOFOL N/A 11/09/2019   Procedure: COLONOSCOPY WITH PROPOFOL;  Surgeon: BRobert Bellow MD;  [facility];  Service: Endoscopy;  Laterality: N/A;   COLOSTOMY  2004   COLOSTOMY REVERSAL  2004   FOOT SURGERY Bilateral 2012   plantar faciatis   HAND SURGERY Bilateral    carpal tunnel   HERNIA REPAIR  2005   at colostomy site after reversal done   JOINT REPLACEMENT     KNEE ARTHROPLASTY Right 04/20/2017   Procedure: COMPUTER ASSISTED TOTAL KNEE ARTHROPLASTY;  Surgeon: HDereck Leep MD;  Location: [facility];  Service: Orthopedics;  Laterality: Right;   KNEE ARTHROSCOPY Right 02/03/2015   Procedure: ARTHROSCOPY right knee, partial medial menisectomy, condyle malleolus, patella and femoral;  Surgeon: JDereck Leep MD;  Location: [facility];  Service: Orthopedics;  Laterality: Right;   MASTECTOMY Left    partial lumpectomy'   NASAL SINUS SURGERY     2005   PORTACATH PLACEMENT Right 04/26/2018   Procedure: INSERTION  PORT-A-CATH RIGHT;  Surgeon: Robert Bellow, MD;  Location: [facility];  Service: General;  Laterality: Right;   SALPINGOOPHORECTOMY  1998   status post colectomy     TONSILLECTOMY     TUBAL LIGATION  1978   UPPER GI ENDOSCOPY  06/28/2012   VAGINAL HYSTERECTOMY  1987    SOCIAL HISTORY: Social History   Socioeconomic History   Marital status: Married    Spouse name: Roger   Number of children: 2   Years of education: Not on file   Highest education level: Not on file  Occupational History   Occupation: worked at [employer] in [place]: retired  Tobacco Use   Smoking status: Never   Smokeless tobacco: Never  Scientific laboratory technician Use: Never used  Substance and Sexual Activity   Alcohol use: Not Currently   Drug use: No    Sexual activity: Yes    Birth control/protection: Surgical  Other Topics Concern   Not on file  Social History Narrative   Not on file   Social Determinants of Health   Financial Resource Strain: Not on file  Food Insecurity: Not on file  Transportation Needs: Not on file  Physical Activity: Not on file  Stress: Not on file  Social Connections: Not on file  Intimate Partner Violence: Not on file    FAMILY HISTORY: Family History  Problem Relation Age of Onset   Heart disease Father    Diabetes Paternal Grandmother    Diabetes Paternal Grandfather    Brain cancer Mother 94   Bone cancer Sister 30   Colon cancer Maternal Uncle        dx 50s-60s   Throat cancer Cousin    Leukemia Cousin    Lung cancer Cousin    Ovarian cancer Neg Hx    Breast cancer Neg Hx    Prostate cancer Neg Hx    Kidney cancer Neg Hx    Bladder Cancer Neg Hx     ALLERGIES:  is allergic to codeine, penicillins, sudafed [pseudoephedrine], and adhesive [tape].  MEDICATIONS:  Current Outpatient Medications  Medication Sig Dispense Refill   albuterol (VENTOLIN HFA) 108 (90 Base) MCG/ACT inhaler Inhale 1-2 puffs into the lungs every 6 (six) hours as needed for wheezing or shortness of breath. 1 g 0   amLODipine (NORVASC) 5 MG tablet Take 5 mg by mouth daily.     atorvastatin (LIPITOR) 10 MG tablet Take 10 mg by mouth daily.     Calcium Carbonate-Vit D-Min (CALCIUM 1200 PO) Take by mouth.     imipramine (TOFRANIL) 25 MG tablet Take 25 mg by mouth at bedtime.     lansoprazole (PREVACID) 30 MG capsule Take 30 mg by mouth 2 (two) times daily.      levothyroxine (SYNTHROID, LEVOTHROID) 50 MCG tablet Take 50 mcg by mouth daily before breakfast.      loratadine (CLARITIN) 10 MG tablet Take 10 mg by mouth daily.     Multiple Vitamins-Minerals (VITAMIN D3 COMPLETE PO) Take by mouth.     Potassium 99 MG TABS Take by mouth daily.     Zinc Sulfate (ZINC 15 PO) Take by mouth.     zolpidem (AMBIEN) 10 MG tablet  Take 10 mg by mouth at bedtime.     gabapentin (NEURONTIN) 300 MG capsule Take 2 capsules (600 mg total) by mouth 3 (three) times daily. 180 capsule 1   losartan (COZAAR) 100 MG tablet Take 100 mg by mouth  daily.     No current facility-administered medications for this visit.     PHYSICAL EXAMINATION: ECOG PERFORMANCE STATUS: 1 - Symptomatic but completely ambulatory Vitals:   03/25/21 0950  BP: 139/77  Pulse: 71  Temp: (!) 96.6 F (35.9 C)   Filed Weights   03/25/21 0950  Weight: 243 lb (110.2 kg)    Physical Exam Constitutional:      General: She is not in acute distress.    Appearance: She is obese.  HENT:     Head: Normocephalic and atraumatic.  Eyes:     General: No scleral icterus.    Pupils: Pupils are equal, round, and reactive to light.  Cardiovascular:     Rate and Rhythm: Normal rate and regular rhythm.     Heart sounds: Normal heart sounds.  Pulmonary:     Effort: Pulmonary effort is normal. No respiratory distress.     Breath sounds: No wheezing.  Abdominal:     General: Bowel sounds are normal. There is no distension.     Palpations: Abdomen is soft. There is no mass.     Tenderness: There is no abdominal tenderness.  Musculoskeletal:        General: No deformity. Normal range of motion.     Cervical back: Normal range of motion and neck supple.  Skin:    General: Skin is warm and dry.     Findings: No erythema or rash.  Neurological:     Mental Status: She is alert and oriented to person, place, and time. Mental status is at baseline.     Cranial Nerves: No cranial nerve deficit.     Coordination: Coordination normal.  Psychiatric:        Mood and Affect: Mood normal.  Breast examination declined.  She has upcoming appointment with surgery.  LABORATORY DATA:  I have reviewed the data as listed Lab Results  Component Value Date   WBC 6.7 03/23/2021   HGB 13.5 03/23/2021   HCT 40.8 03/23/2021   MCV 90.9 03/23/2021   PLT 184 03/23/2021    Recent Labs    07/08/20 1528 11/10/20 1118 02/27/21 1313 03/23/21 0932  NA 136 135 141 136  K 3.1* 3.7 3.6 3.7  CL 96* 95* 101 98  CO2 27 29 25 28   GLUCOSE 114* 131* 112* 121*  BUN 17 15 14 16   CREATININE 0.75 0.67 0.64 0.75  CALCIUM 9.3 9.5 8.8* 8.8*  GFRNONAA >60 >60 >60 >60  PROT 7.3 7.6  --  6.8  ALBUMIN 4.1 4.1  --  3.8  AST 58* 59*  --  51*  ALT 68* 70*  --  57*  ALKPHOS 71 50  --  46  BILITOT 0.8 0.9  --  0.6    Iron/TIBC/Ferritin/ %Sat No results found for: IRON, TIBC, FERRITIN, IRONPCTSAT   RADIOGRAPHIC STUDIES: I have personally reviewed the radiological images as listed and agreed with the findings in the report. DG Chest Portable 1 View  Result Date: 02/27/2021 CLINICAL DATA:  Chest pain. EXAM: PORTABLE CHEST 1 VIEW COMPARISON:  December 12, 2020. FINDINGS: The heart size and mediastinal contours are within normal limits. Both lungs are clear. The visualized skeletal structures are unremarkable. IMPRESSION: No active disease. Electronically Signed   By: Marijo Conception M.D.   On: 02/27/2021 14:59      ASSESSMENT & PLAN:  1. 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 ([place])   2. Neuropathy   3. Transaminitis    Cancer Staging  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 [facility]) Staging form: Breast, AJCC 8th Edition - Clinical stage from 04/28/2018: Stage IB (cT1c, cN0, cM0, G3, ER-, PR-, HER2-) - Signed by Earlie Server, MD on 04/29/2018 - Pathologic stage from 05/25/2018: Stage IB (pT1c, pN0(sn), cM0, G3, ER-, PR-, HER2-) - Signed by Earlie Server, MD on 05/25/2018   # Stage IB left triple negative breast cancer, lumpectomy with SLNB, adjuvant chemotherapy followed by adjuvant radiation. Labs reviewed and discussed with patient.  She is close to 3 years post surgery. Cancer markers remain stable. Continue annual bilateral mammogram. she gets mammogram ordered through Dr. Dwyane Luo office.  #transaminitis due to fatty liver disease.  Stable.  #Pre-existing  neuropathy, exacerbated by chemotherapy, continue gabapentin, recommend patient to increase dose to 600 mg 3 times daily.   #Follow-up  6 months. . All questions were answered. The patient knows to call the clinic with any problems questions or concerns.  Earlie Server, MD, PhD 03/25/2021

## 2021-04-13 ENCOUNTER — Ambulatory Visit
Admission: RE | Admit: 2021-04-13 | Discharge: 2021-04-13 | Disposition: A | Discharge status: Home / Self Care | Payer: PPO | Source: Ambulatory Visit | Attending: General Surgery | Admitting: General Surgery

## 2021-04-13 ENCOUNTER — Other Ambulatory Visit: Payer: Self-pay

## 2021-04-13 DIAGNOSIS — Z171 Estrogen receptor negative status [ER-]: Secondary | ICD-10-CM | POA: Insufficient documentation

## 2021-04-13 DIAGNOSIS — C50212 Malignant neoplasm of upper-inner quadrant of left female breast: Secondary | ICD-10-CM | POA: Diagnosis not present

## 2021-04-13 DIAGNOSIS — Z1231 Encounter for screening mammogram for malignant neoplasm of breast: Secondary | ICD-10-CM | POA: Diagnosis not present

## 2021-04-21 DIAGNOSIS — Z86010 Personal history of colonic polyps: Secondary | ICD-10-CM | POA: Diagnosis not present

## 2021-04-21 DIAGNOSIS — C50212 Malignant neoplasm of upper-inner quadrant of left female breast: Secondary | ICD-10-CM | POA: Diagnosis not present

## 2021-06-22 DIAGNOSIS — E118 Type 2 diabetes mellitus with unspecified complications: Secondary | ICD-10-CM | POA: Diagnosis not present

## 2021-06-22 DIAGNOSIS — Z79899 Other long term (current) drug therapy: Secondary | ICD-10-CM | POA: Diagnosis not present

## 2021-06-22 DIAGNOSIS — E782 Mixed hyperlipidemia: Secondary | ICD-10-CM | POA: Diagnosis not present

## 2021-06-22 DIAGNOSIS — I10 Essential (primary) hypertension: Secondary | ICD-10-CM | POA: Diagnosis not present

## 2021-06-22 DIAGNOSIS — E039 Hypothyroidism, unspecified: Secondary | ICD-10-CM | POA: Diagnosis not present

## 2021-06-29 DIAGNOSIS — K219 Gastro-esophageal reflux disease without esophagitis: Secondary | ICD-10-CM | POA: Diagnosis not present

## 2021-06-29 DIAGNOSIS — E7800 Pure hypercholesterolemia, unspecified: Secondary | ICD-10-CM | POA: Diagnosis not present

## 2021-06-29 DIAGNOSIS — E118 Type 2 diabetes mellitus with unspecified complications: Secondary | ICD-10-CM | POA: Diagnosis not present

## 2021-06-29 DIAGNOSIS — R079 Chest pain, unspecified: Secondary | ICD-10-CM | POA: Diagnosis not present

## 2021-06-29 DIAGNOSIS — E039 Hypothyroidism, unspecified: Secondary | ICD-10-CM | POA: Diagnosis not present

## 2021-06-29 DIAGNOSIS — I10 Essential (primary) hypertension: Secondary | ICD-10-CM | POA: Diagnosis not present

## 2021-06-29 DIAGNOSIS — R0602 Shortness of breath: Secondary | ICD-10-CM | POA: Diagnosis not present

## 2021-06-29 DIAGNOSIS — Z13820 Encounter for screening for osteoporosis: Secondary | ICD-10-CM | POA: Diagnosis not present

## 2021-06-29 DIAGNOSIS — Z79899 Other long term (current) drug therapy: Secondary | ICD-10-CM | POA: Diagnosis not present

## 2021-06-29 DIAGNOSIS — Z6841 Body Mass Index (BMI) 40.0 and over, adult: Secondary | ICD-10-CM | POA: Diagnosis not present

## 2021-06-29 DIAGNOSIS — F5104 Psychophysiologic insomnia: Secondary | ICD-10-CM | POA: Diagnosis not present

## 2021-06-29 DIAGNOSIS — E782 Mixed hyperlipidemia: Secondary | ICD-10-CM | POA: Diagnosis not present

## 2021-07-03 ENCOUNTER — Other Ambulatory Visit: Payer: Self-pay | Admitting: Nurse Practitioner

## 2021-10-05 ENCOUNTER — Inpatient Hospital Stay: Payer: PPO | Attending: Oncology

## 2021-10-05 DIAGNOSIS — Z171 Estrogen receptor negative status [ER-]: Secondary | ICD-10-CM | POA: Insufficient documentation

## 2021-10-05 DIAGNOSIS — C50912 Malignant neoplasm of unspecified site of left female breast: Secondary | ICD-10-CM | POA: Insufficient documentation

## 2021-10-05 DIAGNOSIS — C50919 Malignant neoplasm of unspecified site of unspecified female breast: Secondary | ICD-10-CM

## 2021-10-05 DIAGNOSIS — Z79899 Other long term (current) drug therapy: Secondary | ICD-10-CM | POA: Insufficient documentation

## 2021-10-05 LAB — CBC WITH DIFFERENTIAL/PLATELET
Abs Immature Granulocytes: 0.14 10*3/uL — ABNORMAL HIGH (ref 0.00–0.07)
Basophils Absolute: 0 10*3/uL (ref 0.0–0.1)
Basophils Relative: 0 %
Eosinophils Absolute: 0.1 10*3/uL (ref 0.0–0.5)
Eosinophils Relative: 1 %
HCT: 40.3 % (ref 36.0–46.0)
Hemoglobin: 13.5 g/dL (ref 12.0–15.0)
Immature Granulocytes: 1 %
Lymphocytes Relative: 35 %
Lymphs Abs: 3.9 10*3/uL (ref 0.7–4.0)
MCH: 30 pg (ref 26.0–34.0)
MCHC: 33.5 g/dL (ref 30.0–36.0)
MCV: 89.6 fL (ref 80.0–100.0)
Monocytes Absolute: 0.6 10*3/uL (ref 0.1–1.0)
Monocytes Relative: 6 %
Neutro Abs: 6.3 10*3/uL (ref 1.7–7.7)
Neutrophils Relative %: 57 %
Platelets: 203 10*3/uL (ref 150–400)
RBC: 4.5 MIL/uL (ref 3.87–5.11)
RDW: 13.7 % (ref 11.5–15.5)
WBC: 11.1 10*3/uL — ABNORMAL HIGH (ref 4.0–10.5)
nRBC: 0 % (ref 0.0–0.2)

## 2021-10-05 LAB — COMPREHENSIVE METABOLIC PANEL
ALT: 64 U/L — ABNORMAL HIGH (ref 0–44)
AST: 41 U/L (ref 15–41)
Albumin: 3.7 g/dL (ref 3.5–5.0)
Alkaline Phosphatase: 46 U/L (ref 38–126)
Anion gap: 9 (ref 5–15)
BUN: 26 mg/dL — ABNORMAL HIGH (ref 8–23)
CO2: 30 mmol/L (ref 22–32)
Calcium: 9 mg/dL (ref 8.9–10.3)
Chloride: 99 mmol/L (ref 98–111)
Creatinine, Ser: 0.89 mg/dL (ref 0.44–1.00)
GFR, Estimated: >60 mL/min (ref >60)
Glucose, Bld: 120 mg/dL — ABNORMAL HIGH (ref 70–99)
Potassium: 3.7 mmol/L (ref 3.5–5.1)
Sodium: 138 mmol/L (ref 135–145)
Total Bilirubin: 0.7 mg/dL (ref 0.3–1.2)
Total Protein: 6.7 g/dL (ref 6.5–8.1)

## 2021-10-06 LAB — CANCER ANTIGEN 15-3: CA 15-3: 21.4 U/mL (ref 0.0–25.0)

## 2021-10-06 LAB — CANCER ANTIGEN 27.29: CA 27.29: 18.7 U/mL (ref 0.0–38.6)

## 2021-10-07 ENCOUNTER — Encounter: Payer: Self-pay | Admitting: Oncology

## 2021-10-07 ENCOUNTER — Inpatient Hospital Stay (HOSPITAL_BASED_OUTPATIENT_CLINIC_OR_DEPARTMENT_OTHER): Payer: PPO | Admitting: Oncology

## 2021-10-07 DIAGNOSIS — C50919 Malignant neoplasm of unspecified site of unspecified female breast: Secondary | ICD-10-CM | POA: Diagnosis not present

## 2021-10-07 DIAGNOSIS — G629 Polyneuropathy, unspecified: Secondary | ICD-10-CM

## 2021-10-07 DIAGNOSIS — R7401 Elevation of levels of liver transaminase levels: Secondary | ICD-10-CM | POA: Diagnosis not present

## 2021-10-07 DIAGNOSIS — C50912 Malignant neoplasm of unspecified site of left female breast: Secondary | ICD-10-CM | POA: Diagnosis not present

## 2021-10-07 MED ORDER — GABAPENTIN 300 MG PO CAPS: 600.0000 mg | ORAL_CAPSULE | Freq: Three times a day (TID) | ORAL | 1 refills | Status: DC

## 2021-10-07 NOTE — Assessment & Plan Note (Addendum)
#   Stage IB left triple negative breast cancer, lumpectomy with SLNB, adjuvant chemotherapy followed by adjuvant radiation. Labs reviewed and discussed with patient.  She is close to 3 years post surgery. Cancer markers remain stable. Left breast tenderness and tissue thickening, check diagnostic mammogram Continue annual bilateral mammogram.

## 2021-10-07 NOTE — Assessment & Plan Note (Signed)
due to fatty liver disease.  Stable.

## 2021-10-07 NOTE — Assessment & Plan Note (Signed)
continue gabapentin,600 mg 3 times daily.

## 2021-10-07 NOTE — Progress Notes (Signed)
Hematology/Oncology Progress note [phone] [phone]      Patient Care Team: Idelle Crouch, MD as PCP - General (Internal Medicine) Bary Castilla, Forest Gleason, MD as Consulting Physician (General Surgery) Theodore Demark, RN (Inactive) as Oncology Nurse Navigator Noreene Filbert, MD as Referring Physician (Radiation Oncology) Earlie Server, MD as Consulting Physician (Oncology)  ASSESSMENT & PLAN:   Cancer Staging  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 [facility]) Staging form: Breast, AJCC 8th Edition - Clinical stage from 04/28/2018: Stage IB (cT1c, cN0, cM0, G3, ER-, PR-, HER2-) - Signed by Earlie Server, MD on 04/29/2018 - Pathologic stage from 05/25/2018: Stage IB (pT1c, pN0(sn), cM0, G3, ER-, PR-, HER2-) - Signed by Earlie Server, MD on 05/25/2018   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 ([place]) # Stage IB left triple negative breast cancer, lumpectomy with SLNB, adjuvant chemotherapy followed by adjuvant radiation. Labs reviewed and discussed with patient.  She is close to 3 years post surgery. Cancer markers remain stable. Left breast tenderness and tissue thickening, check diagnostic mammogram Continue annual bilateral mammogram.    Transaminitis due to fatty liver disease.  Stable.  Neuropathy continue gabapentin,600 mg 3 times daily.  Orders Placed This Encounter  Procedures   MM 3D SCREEN BREAST BILATERAL    Standing Status:   Future    Standing Expiration Date:   10/08/2022    Order Specific Question:   Reason for Exam (SYMPTOM  OR DIAGNOSIS REQUIRED)    Answer:   hx of cancer    Order Specific Question:   Preferred imaging location?    Answer:   [facility]   MM DIAG BREAST TOMO UNI LEFT    Standing Status:   Future    Standing Expiration Date:   10/08/2022    Order Specific Question:   Reason for Exam (SYMPTOM  OR DIAGNOSIS REQUIRED)    Answer:   left lower breast tenderness and thickness at 6 o'clock.  hx of breast cancer    Order Specific  Question:   Preferred imaging location?    Answer:   [facility]   US BREAST LTD UNI LEFT INC AXILLA    Standing Status:   Future    Standing Expiration Date:   10/08/2022    Order Specific Question:   Reason for Exam (SYMPTOM  OR DIAGNOSIS REQUIRED)    Answer:   left lower breast tenderness and thickness at 6 o'clock.  hx of breast cancer    Order Specific Question:   Preferred imaging location?    Answer:   [facility]   CBC with Differential/Platelet    Standing Status:   Future    Standing Expiration Date:   10/08/2022   Comprehensive metabolic panel    Standing Status:   Future    Standing Expiration Date:   10/08/2022   Cancer antigen 15-3    Standing Status:   Future    Standing Expiration Date:   10/08/2022   Cancer antigen 27.29    Standing Status:   Future    Standing Expiration Date:   10/08/2022   6 months follow up All questions were answered. The patient knows to call the clinic with any problems, questions or concerns.  Earlie Server, MD, PhD [facility] Hematology Oncology 10/07/2021   REASON FOR VISIT:  Follow up for adjuvant chemotherapy for breast cancer.   HISTORY OF PRESENTING ILLNESS:  Megan Harris is a  71 y.o.  female present for management of  breast cancer  # Stage  IB left triple negative breast cancer   03/27/2018 which showed left breast mass. She had diagnostic and ultrasound on 04/07/2018 which showed 0.9cm x 0.9cm x 1.4cm irregular border mixed echotexture mass at the left breast 11 o'clock 7 cm from nipple correlating to the mammographic finding. Ultrasound of the left axilla is negative She underwent biopsy of left breast mass. 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 Estrogen receptor negative, progesterone receptor negative, HER-2 negative.  S/p left lumpectomy and sentinel lymph node biopsy. pT1pN0 05/09/2018 Baseline MUGA showed no focal wall  motion abnormality of the left ventricle.  Calculated LVEF 61%  # 05/24/2018- 07/05/2018 Adjuvant Chemotherapy ddAC- # 07/19/2018 started on weekly Taxol 80 mg/m. Starting cycle 4, Taxol was decreased to 23m/m2 # 08/23/2018 Cycle cycle 6, Taxol was decreased to 65 mg/m due to neuropathy. 10/04/2018 patient finished adjuvant chemotherapy with ddAC x 4 followed by 12 weekly Taxol. 12/13/2018, she finished adjuvant radiation.  #Patient was recommended for genetic testing and she declined. Mediport was discontinued per patient's preference  # 03/29/2019 patient underwent annual diagnostic breast mammogram bilaterally. Mammogram showed new calcifications in the superior lateral right breast spanning 5 mm. Also there is an abnormal lymph node in the low left axilla representing a change from previous mammogram. 04/03/2019 patient underwent left axillary lymph node biopsy and pathology is negative for malignancy, fibroadipose tissue with necrosis and calcifications, compatible with prior radiation therapy.  Lymph node tissue is not identified. 04/10/2019 breast right upper outer quadrant biopsy showed fibroadenomatoid changes with associated calcifications.  Negative for atypia and malignancy.  Seen by Dr. BBary Castillarecently.  10/23/2019 CT abdomen pelvis was obtained for evaluation of pelvic pain.CT showed mild circumferential bladder wall thickening, she follows with urologist and there is a plan for cystoscopy per patient.  CT also showed hepatic steatosis, 2.1 cm right adnexal cystic mass.  # 11/09/2019, colonoscopy was performed by Dr. BBary Castillawhich showed diverticulosis in the rectosigmoid colon.  2 colon polyps was resected and retrieved.   01/21/2020 ultrasound pelvic showed small simple cyst in the right adnexa.  Patient was seen by Dr. HKenton Kingfishergynecology  INTERVAL HISTORY Megan ENOCHis a 71y.o. female who has above history reviewed by me presents for follow-up of stage I left triple negative  breast cancer Patient reports feeling well.  No new complaints.  +left lower breast tenderness .  Denies any bone pain.  Review of Systems  Constitutional:  Negative for chills and fever.  HENT:  Negative for hearing loss.   Respiratory:  Negative for cough.   Cardiovascular:  Negative for chest pain.  Gastrointestinal:  Negative for abdominal pain, blood in stool and nausea.  Genitourinary:  Negative for frequency and urgency.  Skin:  Negative for itching and rash.  Neurological:  Negative for focal weakness.  Psychiatric/Behavioral:  The patient is not nervous/anxious.     MEDICAL HISTORY:  Past Medical History:  Diagnosis Date   Abdominal pain, left lower quadrant 2012   Abnormal LFTs    Anemia    vitamin d deficiency   Arthritis    Asthma    Back pain 2005   lower back arthritis   [place]    [place]   left invasive mammary   [place]   168m T1c, N0 triple negative   Chronic cholecystitis 2012   has had choleycystectomy.  not an issue   Chronic insomnia  Constipation 2005   COPD (chronic obstructive pulmonary disease) (HCC)    Degenerative disc disease, cervical    Diabetes mellitus without complication ([place])    Diverticulitis    Endocrine problem 2005   Family history of bone cancer    Family history of brain cancer    Family history of colon cancer    Family history of lung cancer    GERD (gastroesophageal reflux disease) 2012   History of GI diverticular bleed    History of peptic ulcer disease    History of pneumonia    Hyperlipidemia 2012   Hypertension 2002   Nausea with vomiting 2012   Obesity, unspecified 2012   Personal history of chemotherapy    Personal history of radiation therapy 2020   invasive mammary   Pneumonia    PONV (postoperative nausea and vomiting)    Special screening for malignant neoplasms, colon 2012   Vitamin D deficiency     SURGICAL HISTORY: Past Surgical History:   Procedure Laterality Date   BILATERAL CARPAL TUNNEL RELEASE Bilateral    BREAST BIOPSY Left 04/13/2018   invasive mamm and DCIS   BREAST BIOPSY Right 04/10/2019   calcs bx, ribbon marker, benign    BREAST BIOPSY Left 04/03/2019   LN bx with Dr Bary Castilla, [place] [facility]   BREAST EXCISIONAL BIOPSY Left early 90s   neg   BREAST LUMPECTOMY Left 04/26/2018   Procedure: BREAST LUMPECTOMY WITH EXCISION OF SENTINEL NODE;  Surgeon: Robert Bellow, MD;  Location: [facility];  Service: General;  Laterality: Left;   BUNIONECTOMY Bilateral 2003   CHOLECYSTECTOMY  06/04/2010   COLON RESECTION  2004   due diverticulitis    COLON SURGERY     COLONOSCOPY  2005   [place], Dr. Bary Castilla   COLONOSCOPY  06/28/2012   COLONOSCOPY WITH PROPOFOL N/A 11/09/2019   Procedure: COLONOSCOPY WITH PROPOFOL;  Surgeon: Robert Bellow, MD;  Location: [facility];  Service: Endoscopy;  Laterality: N/A;   COLOSTOMY  2004   COLOSTOMY REVERSAL  2004   FOOT SURGERY Bilateral 2012   plantar faciatis   HAND SURGERY Bilateral    carpal tunnel   HERNIA REPAIR  2005   at colostomy site after reversal done   JOINT REPLACEMENT     KNEE ARTHROPLASTY Right 04/20/2017   Procedure: COMPUTER ASSISTED TOTAL KNEE ARTHROPLASTY;  Surgeon: Dereck Leep, MD;  Location: [facility];  Service: Orthopedics;  Laterality: Right;   KNEE ARTHROSCOPY Right 02/03/2015   Procedure: ARTHROSCOPY right knee, partial medial menisectomy, condyle malleolus, patella and femoral;  Surgeon: Dereck Leep, MD;  Location: [facility];  Service: Orthopedics;  Laterality: Right;   MASTECTOMY Left    partial lumpectomy'   NASAL SINUS SURGERY     2005   PORTACATH PLACEMENT Right 04/26/2018   Procedure: INSERTION PORT-A-CATH RIGHT;  Surgeon: Robert Bellow, MD;  Location: [facility];  Service: General;  Laterality: Right;   SALPINGOOPHORECTOMY  1998   status post colectomy     TONSILLECTOMY     TUBAL LIGATION  1978   UPPER GI  ENDOSCOPY  06/28/2012   VAGINAL HYSTERECTOMY  1987    SOCIAL HISTORY: Social History   Socioeconomic History   Marital status: Married    Spouse name: Roger   Number of children: 2   Years of education: Not on file   Highest education level: Not on file  Occupational History   Occupation: worked at [employer] in [employer]  Comment: retired  Tobacco Use   Smoking status: Never   Smokeless tobacco: Never  Vaping Use   Vaping Use: Never used  Substance and Sexual Activity   Alcohol use: Not Currently   Drug use: No   Sexual activity: Yes    Birth control/protection: Surgical  Other Topics Concern   Not on file  Social History Narrative   Not on file   Social Determinants of Health   Financial Resource Strain: Not on file  Food Insecurity: Not on file  Transportation Needs: Not on file  Physical Activity: Not on file  Stress: Not on file  Social Connections: Not on file  Intimate Partner Violence: Not on file    FAMILY HISTORY: Family History  Problem Relation Age of Onset   Heart disease Father    Diabetes Paternal Grandmother    Diabetes Paternal Grandfather    Brain cancer Mother 55   Bone cancer Sister 36   Colon cancer Maternal Uncle        dx 50s-60s   Throat cancer Cousin    Leukemia Cousin    Lung cancer Cousin    Ovarian cancer Neg Hx    Breast cancer Neg Hx    Prostate cancer Neg Hx    Kidney cancer Neg Hx    Bladder Cancer Neg Hx     ALLERGIES:  is allergic to codeine, penicillins, sudafed [pseudoephedrine], and adhesive [tape].  MEDICATIONS:  Current Outpatient Medications  Medication Sig Dispense Refill   albuterol (VENTOLIN HFA) 108 (90 Base) MCG/ACT inhaler Inhale 1-2 puffs into the lungs every 6 (six) hours as needed for wheezing or shortness of breath. 1 g 0   amLODipine (NORVASC) 5 MG tablet Take 5 mg by mouth daily.     atorvastatin (LIPITOR) 10 MG tablet Take 10 mg by mouth daily.     Calcium Carbonate-Vit D-Min (CALCIUM 1200 PO) Take by  mouth.     gabapentin (NEURONTIN) 300 MG capsule Take 2 capsules (600 mg total) by mouth 3 (three) times daily. 180 capsule 1   lansoprazole (PREVACID) 30 MG capsule Take 30 mg by mouth 2 (two) times daily.      levothyroxine (SYNTHROID, LEVOTHROID) 50 MCG tablet Take 50 mcg by mouth daily before breakfast.      loratadine (CLARITIN) 10 MG tablet Take 10 mg by mouth daily.     metFORMIN (GLUCOPHAGE) 500 MG tablet TAKE 1 TABLET BY MOUTH 2 TIMES DAILY WITH MEALS.     metoprolol succinate (TOPROL-XL) 50 MG 24 hr tablet Take 50 mg by mouth daily.     Multiple Vitamins-Minerals (VITAMIN D3 COMPLETE PO) Take by mouth.     Potassium 99 MG TABS Take by mouth daily.     telmisartan-hydrochlorothiazide (MICARDIS HCT) 80-25 MG tablet Take 1 tablet by mouth daily.     Zinc Sulfate (ZINC 15 PO) Take by mouth.     zolpidem (AMBIEN) 10 MG tablet Take 10 mg by mouth at bedtime.     No current facility-administered medications for this visit.     PHYSICAL EXAMINATION: ECOG PERFORMANCE STATUS: 1 - Symptomatic but completely ambulatory Vitals:   10/07/21 1033  BP: 127/86  Pulse: 71  Resp: 18  Temp: (!) 96.9 F (36.1 C)   Filed Weights   10/07/21 1033  Weight: 237 lb 14.4 oz (107.9 kg)    Physical Exam Constitutional:      General: She is not in acute distress.    Appearance: She is obese.  HENT:  Head: Normocephalic and atraumatic.  Eyes:     General: No scleral icterus.    Pupils: Pupils are equal, round, and reactive to light.  Cardiovascular:     Rate and Rhythm: Normal rate and regular rhythm.     Heart sounds: Normal heart sounds.  Pulmonary:     Effort: Pulmonary effort is normal. No respiratory distress.     Breath sounds: No wheezing.  Abdominal:     General: Bowel sounds are normal. There is no distension.     Palpations: Abdomen is soft.  Musculoskeletal:        General: No deformity. Normal range of motion.     Cervical back: Normal range of motion and neck supple.   Skin:    General: Skin is warm and dry.     Findings: No erythema or rash.  Neurological:     Mental Status: She is alert and oriented to person, place, and time. Mental status is at baseline.     Cranial Nerves: No cranial nerve deficit.  Psychiatric:        Mood and Affect: Mood normal.   Breast exam was performed in seated and lying down position. Left breast lumpectomy with a well-healed surgical scar. tenderness and focal tissue thickening lower breast.  No palpable breast mass bilaterally. No palpable axillary lymphadenopathy.   LABORATORY DATA:  I have reviewed the data as listed    Latest Ref Rng & Units 10/05/2021    9:23 AM 03/23/2021    9:32 AM 02/27/2021    1:13 PM  CBC  WBC 4.0 - 10.5 K/uL 11.1  6.7  5.3   Hemoglobin 12.0 - 15.0 g/dL 13.5  13.5  12.6   Hematocrit 36.0 - 46.0 % 40.3  40.8  37.5   Platelets 150 - 400 K/uL 203  184  155        Latest Ref Rng & Units 10/05/2021    9:23 AM 03/23/2021    9:32 AM 02/27/2021    1:13 PM  CMP  Glucose 70 - 99 mg/dL 120  121  112   BUN 8 - 23 mg/dL _0 Creatinine 0.44 - 1.00 mg/dL 0.89  0.75  0.64   Sodium 135 - 145 mmol/L 138  136  141   Potassium 3.5 - 5.1 mmol/L 3.7  3.7  3.6   Chloride 98 - 111 mmol/L 99  98  101   CO2 22 - 32 mmol/L _1 Calcium 8.9 - 10.3 mg/dL 9.0  8.8  8.8   Total Protein 6.5 - 8.1 g/dL 6.7  6.8    Total Bilirubin 0.3 - 1.2 mg/dL 0.7  0.6    Alkaline Phos 38 - 126 U/L 46  46    AST 15 - 41 U/L 41  51    ALT 0 - 44 U/L 64  57        RADIOGRAPHIC STUDIES: I have personally reviewed the radiological images as listed and agreed with the findings in the report. No results found.

## 2021-10-26 ENCOUNTER — Ambulatory Visit
Admission: RE | Admit: 2021-10-26 | Discharge: 2021-10-26 | Disposition: A | Discharge status: Home / Self Care | Payer: PPO | Source: Ambulatory Visit | Attending: Oncology | Admitting: Oncology

## 2021-10-26 DIAGNOSIS — C50919 Malignant neoplasm of unspecified site of unspecified female breast: Secondary | ICD-10-CM | POA: Diagnosis not present

## 2021-10-26 DIAGNOSIS — N644 Mastodynia: Secondary | ICD-10-CM | POA: Insufficient documentation

## 2021-10-26 DIAGNOSIS — E118 Type 2 diabetes mellitus with unspecified complications: Secondary | ICD-10-CM | POA: Diagnosis not present

## 2021-10-26 DIAGNOSIS — Z17421 Hormone receptor negative with human epidermal growth factor receptor 2 negative status: Secondary | ICD-10-CM

## 2021-10-26 DIAGNOSIS — M8580 Other specified disorders of bone density and structure, unspecified site: Secondary | ICD-10-CM | POA: Diagnosis not present

## 2021-10-26 DIAGNOSIS — E7800 Pure hypercholesterolemia, unspecified: Secondary | ICD-10-CM | POA: Diagnosis not present

## 2021-10-26 DIAGNOSIS — I10 Essential (primary) hypertension: Secondary | ICD-10-CM | POA: Diagnosis not present

## 2021-10-26 DIAGNOSIS — Z853 Personal history of malignant neoplasm of breast: Secondary | ICD-10-CM | POA: Diagnosis not present

## 2021-10-26 DIAGNOSIS — E039 Hypothyroidism, unspecified: Secondary | ICD-10-CM | POA: Diagnosis not present

## 2021-10-26 DIAGNOSIS — M8588 Other specified disorders of bone density and structure, other site: Secondary | ICD-10-CM | POA: Diagnosis not present

## 2021-10-26 DIAGNOSIS — Z79899 Other long term (current) drug therapy: Secondary | ICD-10-CM | POA: Diagnosis not present

## 2021-11-02 DIAGNOSIS — Z23 Encounter for immunization: Secondary | ICD-10-CM | POA: Diagnosis not present

## 2021-11-02 DIAGNOSIS — Z6841 Body Mass Index (BMI) 40.0 and over, adult: Secondary | ICD-10-CM | POA: Diagnosis not present

## 2021-11-02 DIAGNOSIS — E782 Mixed hyperlipidemia: Secondary | ICD-10-CM | POA: Diagnosis not present

## 2021-11-02 DIAGNOSIS — E118 Type 2 diabetes mellitus with unspecified complications: Secondary | ICD-10-CM | POA: Diagnosis not present

## 2021-11-02 DIAGNOSIS — Z79899 Other long term (current) drug therapy: Secondary | ICD-10-CM | POA: Diagnosis not present

## 2021-11-02 DIAGNOSIS — E039 Hypothyroidism, unspecified: Secondary | ICD-10-CM | POA: Diagnosis not present

## 2021-11-02 DIAGNOSIS — E559 Vitamin D deficiency, unspecified: Secondary | ICD-10-CM | POA: Diagnosis not present

## 2021-11-02 DIAGNOSIS — I10 Essential (primary) hypertension: Secondary | ICD-10-CM | POA: Diagnosis not present

## 2021-11-02 DIAGNOSIS — Z1231 Encounter for screening mammogram for malignant neoplasm of breast: Secondary | ICD-10-CM | POA: Diagnosis not present

## 2022-02-08 DIAGNOSIS — E782 Mixed hyperlipidemia: Secondary | ICD-10-CM | POA: Diagnosis not present

## 2022-02-08 DIAGNOSIS — I209 Angina pectoris, unspecified: Secondary | ICD-10-CM | POA: Diagnosis not present

## 2022-02-08 DIAGNOSIS — Z6841 Body Mass Index (BMI) 40.0 and over, adult: Secondary | ICD-10-CM | POA: Diagnosis not present

## 2022-02-08 DIAGNOSIS — E118 Type 2 diabetes mellitus with unspecified complications: Secondary | ICD-10-CM | POA: Diagnosis not present

## 2022-02-08 DIAGNOSIS — I10 Essential (primary) hypertension: Secondary | ICD-10-CM | POA: Diagnosis not present

## 2022-02-08 DIAGNOSIS — R0602 Shortness of breath: Secondary | ICD-10-CM | POA: Diagnosis not present

## 2022-02-22 DIAGNOSIS — I10 Essential (primary) hypertension: Secondary | ICD-10-CM | POA: Diagnosis not present

## 2022-02-22 DIAGNOSIS — E039 Hypothyroidism, unspecified: Secondary | ICD-10-CM | POA: Diagnosis not present

## 2022-02-22 DIAGNOSIS — Z79899 Other long term (current) drug therapy: Secondary | ICD-10-CM | POA: Diagnosis not present

## 2022-02-22 DIAGNOSIS — E782 Mixed hyperlipidemia: Secondary | ICD-10-CM | POA: Diagnosis not present

## 2022-02-22 DIAGNOSIS — R8290 Unspecified abnormal findings in urine: Secondary | ICD-10-CM | POA: Diagnosis not present

## 2022-02-22 DIAGNOSIS — E118 Type 2 diabetes mellitus with unspecified complications: Secondary | ICD-10-CM | POA: Diagnosis not present

## 2022-03-01 DIAGNOSIS — I10 Essential (primary) hypertension: Secondary | ICD-10-CM | POA: Diagnosis not present

## 2022-03-01 DIAGNOSIS — E559 Vitamin D deficiency, unspecified: Secondary | ICD-10-CM | POA: Diagnosis not present

## 2022-03-01 DIAGNOSIS — R7303 Prediabetes: Secondary | ICD-10-CM | POA: Diagnosis not present

## 2022-03-01 DIAGNOSIS — K219 Gastro-esophageal reflux disease without esophagitis: Secondary | ICD-10-CM | POA: Diagnosis not present

## 2022-03-01 DIAGNOSIS — E782 Mixed hyperlipidemia: Secondary | ICD-10-CM | POA: Diagnosis not present

## 2022-03-01 DIAGNOSIS — E039 Hypothyroidism, unspecified: Secondary | ICD-10-CM | POA: Diagnosis not present

## 2022-03-01 DIAGNOSIS — Z0000 Encounter for general adult medical examination without abnormal findings: Secondary | ICD-10-CM | POA: Diagnosis not present

## 2022-03-01 DIAGNOSIS — E118 Type 2 diabetes mellitus with unspecified complications: Secondary | ICD-10-CM | POA: Diagnosis not present

## 2022-03-01 DIAGNOSIS — Z6841 Body Mass Index (BMI) 40.0 and over, adult: Secondary | ICD-10-CM | POA: Diagnosis not present

## 2022-03-02 DIAGNOSIS — D225 Melanocytic nevi of trunk: Secondary | ICD-10-CM | POA: Diagnosis not present

## 2022-03-02 DIAGNOSIS — D2261 Melanocytic nevi of right upper limb, including shoulder: Secondary | ICD-10-CM | POA: Diagnosis not present

## 2022-03-02 DIAGNOSIS — D2271 Melanocytic nevi of right lower limb, including hip: Secondary | ICD-10-CM | POA: Diagnosis not present

## 2022-03-02 DIAGNOSIS — L821 Other seborrheic keratosis: Secondary | ICD-10-CM | POA: Diagnosis not present

## 2022-03-02 DIAGNOSIS — D2272 Melanocytic nevi of left lower limb, including hip: Secondary | ICD-10-CM | POA: Diagnosis not present

## 2022-03-02 DIAGNOSIS — D2262 Melanocytic nevi of left upper limb, including shoulder: Secondary | ICD-10-CM | POA: Diagnosis not present

## 2022-03-08 ENCOUNTER — Telehealth: Payer: Self-pay | Admitting: Oncology

## 2022-03-08 NOTE — Telephone Encounter (Signed)
lvm to notify of chnage of appt date due to provider being out, appt has been r/s

## 2022-04-07 ENCOUNTER — Inpatient Hospital Stay: Payer: PPO | Attending: Oncology

## 2022-04-07 DIAGNOSIS — C50912 Malignant neoplasm of unspecified site of left female breast: Secondary | ICD-10-CM | POA: Diagnosis not present

## 2022-04-07 DIAGNOSIS — Z171 Estrogen receptor negative status [ER-]: Secondary | ICD-10-CM | POA: Insufficient documentation

## 2022-04-07 DIAGNOSIS — C50919 Malignant neoplasm of unspecified site of unspecified female breast: Secondary | ICD-10-CM

## 2022-04-07 DIAGNOSIS — G629 Polyneuropathy, unspecified: Secondary | ICD-10-CM | POA: Diagnosis not present

## 2022-04-07 DIAGNOSIS — R7401 Elevation of levels of liver transaminase levels: Secondary | ICD-10-CM | POA: Insufficient documentation

## 2022-04-07 LAB — CBC WITH DIFFERENTIAL/PLATELET
Abs Immature Granulocytes: 0.01 10*3/uL (ref 0.00–0.07)
Basophils Absolute: 0.1 10*3/uL (ref 0.0–0.1)
Basophils Relative: 1 %
Eosinophils Absolute: 0.1 10*3/uL (ref 0.0–0.5)
Eosinophils Relative: 2 %
HCT: 40.6 % (ref 36.0–46.0)
Hemoglobin: 13.2 g/dL (ref 12.0–15.0)
Immature Granulocytes: 0 %
Lymphocytes Relative: 32 %
Lymphs Abs: 1.9 10*3/uL (ref 0.7–4.0)
MCH: 29.3 pg (ref 26.0–34.0)
MCHC: 32.5 g/dL (ref 30.0–36.0)
MCV: 90 fL (ref 80.0–100.0)
Monocytes Absolute: 0.4 10*3/uL (ref 0.1–1.0)
Monocytes Relative: 7 %
Neutro Abs: 3.6 10*3/uL (ref 1.7–7.7)
Neutrophils Relative %: 58 %
Platelets: 214 10*3/uL (ref 150–400)
RBC: 4.51 MIL/uL (ref 3.87–5.11)
RDW: 13.6 % (ref 11.5–15.5)
WBC: 6.1 10*3/uL (ref 4.0–10.5)
nRBC: 0 % (ref 0.0–0.2)

## 2022-04-07 LAB — COMPREHENSIVE METABOLIC PANEL
ALT: 61 U/L — ABNORMAL HIGH (ref 0–44)
AST: 64 U/L — ABNORMAL HIGH (ref 15–41)
Albumin: 4.1 g/dL (ref 3.5–5.0)
Alkaline Phosphatase: 56 U/L (ref 38–126)
Anion gap: 11 (ref 5–15)
BUN: 24 mg/dL — ABNORMAL HIGH (ref 8–23)
CO2: 28 mmol/L (ref 22–32)
Calcium: 9.3 mg/dL (ref 8.9–10.3)
Chloride: 99 mmol/L (ref 98–111)
Creatinine, Ser: 1.1 mg/dL — ABNORMAL HIGH (ref 0.44–1.00)
GFR, Estimated: 53 mL/min — ABNORMAL LOW (ref >60)
Glucose, Bld: 151 mg/dL — ABNORMAL HIGH (ref 70–99)
Potassium: 4.3 mmol/L (ref 3.5–5.1)
Sodium: 138 mmol/L (ref 135–145)
Total Bilirubin: 0.9 mg/dL (ref 0.3–1.2)
Total Protein: 7.3 g/dL (ref 6.5–8.1)

## 2022-04-08 LAB — CANCER ANTIGEN 15-3: CA 15-3: 21.8 U/mL (ref 0.0–25.0)

## 2022-04-08 LAB — CANCER ANTIGEN 27.29: CA 27.29: 17.1 U/mL (ref 0.0–38.6)

## 2022-04-12 ENCOUNTER — Ambulatory Visit: Payer: PPO | Admitting: Oncology

## 2022-04-15 ENCOUNTER — Ambulatory Visit
Admission: RE | Admit: 2022-04-15 | Discharge: 2022-04-15 | Disposition: A | Discharge status: Home / Self Care | Payer: PPO | Source: Ambulatory Visit | Attending: Oncology | Admitting: Oncology

## 2022-04-15 DIAGNOSIS — Z1231 Encounter for screening mammogram for malignant neoplasm of breast: Secondary | ICD-10-CM | POA: Diagnosis not present

## 2022-04-15 DIAGNOSIS — C50919 Malignant neoplasm of unspecified site of unspecified female breast: Secondary | ICD-10-CM | POA: Insufficient documentation

## 2022-04-19 ENCOUNTER — Ambulatory Visit: Payer: PPO | Admitting: Oncology

## 2022-04-21 ENCOUNTER — Encounter: Payer: Self-pay | Admitting: Oncology

## 2022-04-21 ENCOUNTER — Inpatient Hospital Stay: Payer: PPO | Attending: Oncology | Admitting: Oncology

## 2022-04-21 VITALS — BP 133/73 | HR 75 | Temp 97.9°F | Resp 18 | Wt 236.8 lb

## 2022-04-21 DIAGNOSIS — G629 Polyneuropathy, unspecified: Secondary | ICD-10-CM

## 2022-04-21 DIAGNOSIS — R7401 Elevation of levels of liver transaminase levels: Secondary | ICD-10-CM | POA: Diagnosis not present

## 2022-04-21 DIAGNOSIS — Z800 Family history of malignant neoplasm of digestive organs: Secondary | ICD-10-CM | POA: Diagnosis not present

## 2022-04-21 DIAGNOSIS — Z171 Estrogen receptor negative status [ER-]: Secondary | ICD-10-CM | POA: Diagnosis not present

## 2022-04-21 DIAGNOSIS — Z808 Family history of malignant neoplasm of other organs or systems: Secondary | ICD-10-CM | POA: Insufficient documentation

## 2022-04-21 DIAGNOSIS — C50919 Malignant neoplasm of unspecified site of unspecified female breast: Secondary | ICD-10-CM

## 2022-04-21 DIAGNOSIS — C50912 Malignant neoplasm of unspecified site of left female breast: Secondary | ICD-10-CM | POA: Diagnosis not present

## 2022-04-21 DIAGNOSIS — C50212 Malignant neoplasm of upper-inner quadrant of left female breast: Secondary | ICD-10-CM | POA: Diagnosis not present

## 2022-04-21 DIAGNOSIS — Z79899 Other long term (current) drug therapy: Secondary | ICD-10-CM | POA: Insufficient documentation

## 2022-04-21 DIAGNOSIS — Z806 Family history of leukemia: Secondary | ICD-10-CM | POA: Diagnosis not present

## 2022-04-21 DIAGNOSIS — Z801 Family history of malignant neoplasm of trachea, bronchus and lung: Secondary | ICD-10-CM | POA: Insufficient documentation

## 2022-04-21 MED ORDER — GABAPENTIN 300 MG PO CAPS: 900.0000 mg | ORAL_CAPSULE | Freq: Three times a day (TID) | ORAL | 1 refills | Status: DC

## 2022-04-21 NOTE — Assessment & Plan Note (Addendum)
#   Stage IB left triple negative breast cancer, lumpectomy with SLNB, adjuvant chemotherapy followed by adjuvant radiation. Labs reviewed and discussed with patient.  She is close to 4 years post surgery. Cancer markers remain stable. Mammogram results reviewed and showed no signs of recurrence, continuing with monitoring.

## 2022-04-21 NOTE — Assessment & Plan Note (Addendum)
continue gabapentin 900 mg 3 times daily.

## 2022-04-21 NOTE — Progress Notes (Signed)
Pt here for follow up. NO new breast problems reported

## 2022-04-21 NOTE — Progress Notes (Signed)
Hematology/Oncology Progress note [phone] [phone]     REASON FOR VISIT:  Follow up for adjuvant chemotherapy for breast cancer.    ASSESSMENT & PLAN:   Cancer Staging  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 Staging form: Breast, AJCC 8th Edition - Clinical stage from 04/28/2018: Stage IB (cT1c, cN0, cM0, G3, ER-, PR-, HER2-) - Signed by Earlie Server, MD on 04/29/2018 - Pathologic stage from 05/25/2018: Stage IB (pT1c, pN0(sn), cM0, G3, ER-, PR-, HER2-) - Signed by Earlie Server, MD on 05/25/2018   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 ([place]) # Stage IB left triple negative breast cancer, lumpectomy with SLNB, adjuvant chemotherapy followed by adjuvant radiation. Labs reviewed and discussed with patient.  She is close to 4 years post surgery. Cancer markers remain stable. Mammogram results reviewed and showed no signs of recurrence, continuing with monitoring.  Neuropathy continue gabapentin 900 mg 3 times daily.  Transaminitis due to fatty liver disease.  Stable.  Orders Placed This Encounter  Procedures   Cancer antigen 15-3    Standing Status:   Future    Standing Expiration Date:   04/21/2023   Cancer antigen 27.29    Standing Status:   Future    Standing Expiration Date:   04/21/2023   CBC with Differential ([place] Only)    Standing Status:   Future    Standing Expiration Date:   04/21/2023   CMP ([place] only)    Standing Status:   Future    Standing Expiration Date:   04/21/2023   6 months follow up All questions were answered. The patient knows to call the clinic with any problems, questions or concerns.  Earlie Server, MD, PhD [facility] Hematology Oncology 04/21/2022     HISTORY OF PRESENTING ILLNESS:  Megan Harris is a  72 y.o.  female present for management of  breast cancer  # Stage IB left triple negative breast cancer   03/27/2018 which showed left breast mass. She had diagnostic and ultrasound on 04/07/2018 which showed  0.9cm x 0.9cm x 1.4cm irregular border mixed echotexture mass at the left breast 11 o'clock 7 cm from nipple correlating to the mammographic finding. Ultrasound of the left axilla is negative She underwent biopsy of left breast mass. 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 Estrogen receptor negative, progesterone receptor negative, HER-2 negative.  S/p left lumpectomy and sentinel lymph node biopsy. pT1pN0 05/09/2018 Baseline MUGA showed no focal wall motion abnormality of the left ventricle.  Calculated LVEF 61%  # 05/24/2018- 07/05/2018 Adjuvant Chemotherapy ddAC- # 07/19/2018 started on weekly Taxol 80 mg/m. Starting cycle 4, Taxol was decreased to 70mg /m2 # 08/23/2018 Cycle cycle 6, Taxol was decreased to 65 mg/m due to neuropathy. 10/04/2018 patient finished adjuvant chemotherapy with ddAC x 4 followed by 12 weekly Taxol. 12/13/2018, she finished adjuvant radiation.  #Patient was recommended for genetic testing and she declined. Mediport was discontinued per patient's preference  # 03/29/2019 patient underwent annual diagnostic breast mammogram bilaterally. Mammogram showed new calcifications in the superior lateral right breast spanning 5 mm. Also there is an abnormal lymph node in the low left axilla representing a change from previous mammogram. 04/03/2019 patient underwent left axillary lymph node biopsy and pathology is negative for malignancy, fibroadipose tissue with necrosis and calcifications, compatible with prior radiation therapy.  Lymph node tissue is not identified. 04/10/2019 breast right upper outer quadrant biopsy showed fibroadenomatoid changes with associated calcifications.  Negative for atypia and malignancy.  Seen by Dr. Bary Castilla recently.  10/23/2019 CT abdomen pelvis was obtained for evaluation of pelvic pain.CT showed mild circumferential bladder wall thickening, she follows  with urologist and there is a plan for cystoscopy per patient.  CT also showed hepatic steatosis, 2.1 cm right adnexal cystic mass.  # 11/09/2019, colonoscopy was performed by Dr. Bary Castilla which showed diverticulosis in the rectosigmoid colon.  2 colon polyps was resected and retrieved.   01/21/2020 ultrasound pelvic showed small simple cyst in the right adnexa.  Patient was seen by Dr. Kenton Kingfisher gynecology  INTERVAL HISTORY Megan Harris is a 72 y.o. female who has above history reviewed by me presents for follow-up of stage I left triple negative breast cancer Patient reports feeling well.  Denies any bone pain. No new concerns were reported. reported persistent neuropathy, described as a sensation of thickness in the feet, not numbness. Currently managing with gabapentin 900mg  TID and over-the-counter treatments including Voltaren creams.  Review of Systems  Constitutional:  Negative for chills and fever.  HENT:  Negative for hearing loss.   Respiratory:  Negative for cough.   Cardiovascular:  Negative for chest pain.  Gastrointestinal:  Negative for abdominal pain, blood in stool and nausea.  Genitourinary:  Negative for frequency and urgency.  Skin:  Negative for itching and rash.  Neurological:  Negative for focal weakness.  Psychiatric/Behavioral:  The patient is not nervous/anxious.     MEDICAL HISTORY:  Past Medical History:  Diagnosis Date   Abdominal pain, left lower quadrant 2012   Abnormal LFTs    Anemia    vitamin d deficiency   Arthritis    Asthma    Back pain 2005   lower back arthritis   Bone cancer    Breast cancer 04/2018   left invasive mammary   Cancer 04/26/2018   64mm, T1c, N0 triple negative   Chronic cholecystitis 2012   has had choleycystectomy.  not an issue   Chronic insomnia    Constipation 2005   COPD (chronic obstructive pulmonary disease)    Degenerative disc disease, cervical    Diabetes mellitus without complication    Diverticulitis     Endocrine problem 2005   Family history of bone cancer    Family history of brain cancer    Family history of colon cancer    Family history of lung cancer    GERD (gastroesophageal reflux disease) 2012   History of GI diverticular bleed    History of peptic ulcer disease    History of pneumonia    Hyperlipidemia 2012   Hypertension 2002   Nausea with vomiting 2012   Obesity, unspecified 2012   Personal history of chemotherapy    Personal history of radiation therapy 2020   invasive mammary   Pneumonia    PONV (postoperative nausea and vomiting)    Special screening for malignant neoplasms, colon 2012   Vitamin D deficiency     SURGICAL HISTORY: Past Surgical History:  Procedure Laterality Date   BILATERAL CARPAL TUNNEL RELEASE Bilateral    BREAST BIOPSY Left 04/13/2018   invasive mamm and DCIS   BREAST BIOPSY Right 04/10/2019   calcs bx, ribbon marker, benign    BREAST BIOPSY Left 04/03/2019   LN bx with Dr Bary Castilla, [facility]   BREAST EXCISIONAL BIOPSY Left early 90s   neg   BREAST LUMPECTOMY Left 04/26/2018   Procedure: BREAST LUMPECTOMY WITH EXCISION OF SENTINEL NODE;  Surgeon:  Robert Bellow, MD;  Location: [facility];  Service: General;  Laterality: Left;   BUNIONECTOMY Bilateral 2003   CHOLECYSTECTOMY  06/04/2010   COLON RESECTION  2004   due diverticulitis    COLON SURGERY     COLONOSCOPY  2005   [facility], Dr. Bary Castilla   COLONOSCOPY  06/28/2012   COLONOSCOPY WITH PROPOFOL N/A 11/09/2019   Procedure: COLONOSCOPY WITH PROPOFOL;  Surgeon: Robert Bellow, MD;  Location: [facility];  Service: Endoscopy;  Laterality: N/A;   COLOSTOMY  2004   COLOSTOMY REVERSAL  2004   FOOT SURGERY Bilateral 2012   plantar faciatis   HAND SURGERY Bilateral    carpal tunnel   HERNIA REPAIR  2005   at colostomy site after reversal done   JOINT REPLACEMENT     KNEE ARTHROPLASTY Right 04/20/2017   Procedure: COMPUTER ASSISTED TOTAL KNEE  ARTHROPLASTY;  Surgeon: Dereck Leep, MD;  Location: [facility];  Service: Orthopedics;  Laterality: Right;   KNEE ARTHROSCOPY Right 02/03/2015   Procedure: ARTHROSCOPY right knee, partial medial menisectomy, condyle malleolus, patella and femoral;  Surgeon: Dereck Leep, MD;  Location: [facility];  Service: Orthopedics;  Laterality: Right;   MASTECTOMY Left    partial lumpectomy'   NASAL SINUS SURGERY     2005   PORTACATH PLACEMENT Right 04/26/2018   Procedure: INSERTION PORT-A-CATH RIGHT;  Surgeon: Robert Bellow, MD;  Location: [facility];  Service: General;  Laterality: Right;   SALPINGOOPHORECTOMY  1998   status post colectomy     TONSILLECTOMY     TUBAL LIGATION  1978   UPPER GI ENDOSCOPY  06/28/2012   VAGINAL HYSTERECTOMY  1987    SOCIAL HISTORY: Social History   Socioeconomic History   Marital status: Married    Spouse name: Roger   Number of children: 2   Years of education: Not on file   Highest education level: Not on file  Occupational History   Occupation: worked at [employer] in [place]: retired  Tobacco Use   Smoking status: Never   Smokeless tobacco: Never  Scientific laboratory technician Use: Never used  Substance and Sexual Activity   Alcohol use: Not Currently   Drug use: No   Sexual activity: Yes    Birth control/protection: Surgical  Other Topics Concern   Not on file  Social History Narrative   Not on file   Social Determinants of Health   Financial Resource Strain: Not on file  Food Insecurity: Not on file  Transportation Needs: Not on file  Physical Activity: Not on file  Stress: Not on file  Social Connections: Not on file  Intimate Partner Violence: Not on file    FAMILY HISTORY: Family History  Problem Relation Age of Onset   Heart disease Father    Diabetes Paternal Grandmother    Diabetes Paternal Grandfather    Brain cancer Mother 41   Bone cancer Sister 55   Colon cancer Maternal Uncle        dx 50s-60s   Throat cancer Cousin     Leukemia Cousin    Lung cancer Cousin    Ovarian cancer Neg Hx    Breast cancer Neg Hx    Prostate cancer Neg Hx    Kidney cancer Neg Hx    Bladder Cancer Neg Hx     ALLERGIES:  is allergic to codeine, penicillins, sudafed [pseudoephedrine], and adhesive [tape].  MEDICATIONS:  Current Outpatient Medications  Medication Sig Dispense  Refill   albuterol (VENTOLIN HFA) 108 (90 Base) MCG/ACT inhaler Inhale 1-2 puffs into the lungs every 6 (six) hours as needed for wheezing or shortness of breath. 1 g 0   amLODipine (NORVASC) 5 MG tablet Take 5 mg by mouth daily.     atorvastatin (LIPITOR) 10 MG tablet Take 10 mg by mouth daily.     Calcium Carbonate-Vit D-Min (CALCIUM 1200 PO) Take by mouth.     lansoprazole (PREVACID) 30 MG capsule Take 30 mg by mouth 2 (two) times daily.      levothyroxine (SYNTHROID, LEVOTHROID) 50 MCG tablet Take 50 mcg by mouth daily before breakfast.      loratadine (CLARITIN) 10 MG tablet Take 10 mg by mouth daily.     metFORMIN (GLUCOPHAGE) 500 MG tablet TAKE 1 TABLET BY MOUTH 2 TIMES DAILY WITH MEALS.     Multiple Vitamins-Minerals (VITAMIN D3 COMPLETE PO) Take by mouth.     Potassium 99 MG TABS Take by mouth daily.     telmisartan-hydrochlorothiazide (MICARDIS HCT) 80-25 MG tablet Take 1 tablet by mouth daily.     Zinc Sulfate (ZINC 15 PO) Take by mouth.     zolpidem (AMBIEN) 10 MG tablet Take 10 mg by mouth at bedtime.     gabapentin (NEURONTIN) 300 MG capsule Take 3 capsules (900 mg total) by mouth 3 (three) times daily. 180 capsule 1   No current facility-administered medications for this visit.     PHYSICAL EXAMINATION: ECOG PERFORMANCE STATUS: 1 - Symptomatic but completely ambulatory Vitals:   04/21/22 1320  BP: 133/73  Pulse: 75  Resp: 18  Temp: 97.9 F (36.6 C)   Filed Weights   04/21/22 1320  Weight: 236 lb 12.8 oz (107.4 kg)    Physical Exam Constitutional:      General: She is not in acute distress.    Appearance: She is obese.   HENT:     Head: Normocephalic and atraumatic.  Eyes:     General: No scleral icterus.    Pupils: Pupils are equal, round, and reactive to light.  Cardiovascular:     Rate and Rhythm: Normal rate and regular rhythm.     Heart sounds: Normal heart sounds.  Pulmonary:     Effort: Pulmonary effort is normal. No respiratory distress.     Breath sounds: No wheezing.  Abdominal:     General: Bowel sounds are normal. There is no distension.     Palpations: Abdomen is soft.  Musculoskeletal:        General: No deformity. Normal range of motion.     Cervical back: Normal range of motion and neck supple.  Skin:    General: Skin is warm and dry.     Findings: No erythema or rash.  Neurological:     Mental Status: She is alert and oriented to person, place, and time. Mental status is at baseline.     Cranial Nerves: No cranial nerve deficit.  Psychiatric:        Mood and Affect: Mood normal.     LABORATORY DATA:  I have reviewed the data as listed    Latest Ref Rng & Units 04/07/2022   11:01 AM 10/05/2021    9:23 AM 03/23/2021    9:32 AM  CBC  WBC 4.0 - 10.5 K/uL 6.1  11.1  6.7   Hemoglobin 12.0 - 15.0 g/dL 13.2  13.5  13.5   Hematocrit 36.0 - 46.0 % 40.6  40.3  40.8   Platelets 150 -  400 K/uL 214  203  184        Latest Ref Rng & Units 04/07/2022   11:01 AM 10/05/2021    9:23 AM 03/23/2021    9:32 AM  CMP  Glucose 70 - 99 mg/dL 151  120  121   BUN 8 - 23 mg/dL 24  26  16    Creatinine 0.44 - 1.00 mg/dL 1.10  0.89  0.75   Sodium 135 - 145 mmol/L 138  138  136   Potassium 3.5 - 5.1 mmol/L 4.3  3.7  3.7   Chloride 98 - 111 mmol/L 99  99  98   CO2 22 - 32 mmol/L 28  30  28    Calcium 8.9 - 10.3 mg/dL 9.3  9.0  8.8   Total Protein 6.5 - 8.1 g/dL 7.3  6.7  6.8   Total Bilirubin 0.3 - 1.2 mg/dL 0.9  0.7  0.6   Alkaline Phos 38 - 126 U/L 56  46  46   AST 15 - 41 U/L 64  41  51   ALT 0 - 44 U/L 61  64  57       RADIOGRAPHIC STUDIES: I have personally reviewed the radiological  images as listed and agreed with the findings in the report. MM 3D SCREEN BREAST BILATERAL  Result Date: 04/16/2022 CLINICAL DATA:  Screening. EXAM: DIGITAL SCREENING BILATERAL MAMMOGRAM WITH TOMOSYNTHESIS AND CAD TECHNIQUE: Bilateral screening digital craniocaudal and mediolateral oblique mammograms were obtained. Bilateral screening digital breast tomosynthesis was performed. The images were evaluated with computer-aided detection. COMPARISON:  Previous exam(s). ACR Breast Density Category a: The breasts are almost entirely fatty. FINDINGS: There are no findings suspicious for malignancy. IMPRESSION: No mammographic evidence of malignancy. A result letter of this screening mammogram will be mailed directly to the patient. RECOMMENDATION: Screening mammogram in one year. (Code:SM-B-01Y) BI-RADS CATEGORY  1: Negative. Electronically Signed   By: Lajean Manes M.D.   On: 04/16/2022 10:04

## 2022-04-21 NOTE — Assessment & Plan Note (Signed)
due to fatty liver disease.  Stable. 

## 2022-05-10 ENCOUNTER — Other Ambulatory Visit: Payer: Self-pay | Admitting: Ophthalmology

## 2022-05-10 DIAGNOSIS — H5789 Other specified disorders of eye and adnexa: Secondary | ICD-10-CM

## 2022-05-14 ENCOUNTER — Ambulatory Visit
Admission: RE | Admit: 2022-05-14 | Discharge: 2022-05-14 | Disposition: A | Discharge status: Home / Self Care | Payer: PPO | Source: Ambulatory Visit | Attending: Ophthalmology | Admitting: Ophthalmology

## 2022-05-14 DIAGNOSIS — H5789 Other specified disorders of eye and adnexa: Secondary | ICD-10-CM | POA: Insufficient documentation

## 2022-05-14 DIAGNOSIS — R220 Localized swelling, mass and lump, head: Secondary | ICD-10-CM | POA: Diagnosis not present

## 2022-05-14 MED ORDER — IOHEXOL 300 MG/ML  SOLN
75.0000 mL | Freq: Once | INTRAMUSCULAR | Status: AC | PRN
  Administered 2022-05-14: 75 mL via INTRAVENOUS

## 2022-05-21 DIAGNOSIS — J302 Other seasonal allergic rhinitis: Secondary | ICD-10-CM | POA: Diagnosis not present

## 2022-05-21 DIAGNOSIS — H02846 Edema of left eye, unspecified eyelid: Secondary | ICD-10-CM | POA: Diagnosis not present

## 2022-05-21 DIAGNOSIS — H0259 Other disorders affecting eyelid function: Secondary | ICD-10-CM | POA: Diagnosis not present

## 2022-06-02 DIAGNOSIS — H5789 Other specified disorders of eye and adnexa: Secondary | ICD-10-CM | POA: Diagnosis not present

## 2022-06-10 ENCOUNTER — Other Ambulatory Visit
Admission: RE | Admit: 2022-06-10 | Discharge: 2022-06-10 | Disposition: A | Discharge status: Home / Self Care | Payer: PPO | Source: Ambulatory Visit | Attending: Oculoplastics Ophthalmology | Admitting: Oculoplastics Ophthalmology

## 2022-06-10 DIAGNOSIS — H0520 Unspecified exophthalmos: Secondary | ICD-10-CM | POA: Diagnosis not present

## 2022-06-10 DIAGNOSIS — H5789 Other specified disorders of eye and adnexa: Secondary | ICD-10-CM | POA: Diagnosis not present

## 2022-06-10 LAB — SEDIMENTATION RATE: Sed Rate: 17 mm/hr (ref 0–30)

## 2022-06-10 LAB — T4, FREE: Free T4: 0.95 ng/dL (ref 0.61–1.12)

## 2022-06-10 LAB — TSH: TSH: 0.318 u[IU]/mL — ABNORMAL LOW (ref 0.350–4.500)

## 2022-06-11 LAB — ANCA PROFILE
Anti-MPO Antibodies: <0.2 units (ref 0.0–0.9)
Anti-PR3 Antibodies: <0.2 units (ref 0.0–0.9)
Atypical P-ANCA titer: <1:20 {titer}
C-ANCA: <1:20 {titer}
P-ANCA: <1:20 {titer}

## 2022-06-11 LAB — THYROID STIMULATING IMMUNOGLOBULIN: Thyroid Stimulating Immunoglob: 1.13 IU/L — ABNORMAL HIGH (ref 0.00–0.55)

## 2022-06-11 LAB — ACETYLCHOLINE RECEPTOR, BINDING: Acety choline binding ab: <0.03 nmol/L (ref 0.00–0.24)

## 2022-06-12 LAB — THYROGLOBULIN ANTIBODY: Thyroglobulin Antibody: <1 IU/mL (ref 0.0–0.9)

## 2022-06-16 LAB — MISC LABCORP TEST (SEND OUT): Labcorp test code: 520018

## 2022-06-24 DIAGNOSIS — E118 Type 2 diabetes mellitus with unspecified complications: Secondary | ICD-10-CM | POA: Diagnosis not present

## 2022-06-24 DIAGNOSIS — E039 Hypothyroidism, unspecified: Secondary | ICD-10-CM | POA: Diagnosis not present

## 2022-06-24 DIAGNOSIS — E782 Mixed hyperlipidemia: Secondary | ICD-10-CM | POA: Diagnosis not present

## 2022-06-24 DIAGNOSIS — Z79899 Other long term (current) drug therapy: Secondary | ICD-10-CM | POA: Diagnosis not present

## 2022-07-01 DIAGNOSIS — E039 Hypothyroidism, unspecified: Secondary | ICD-10-CM | POA: Diagnosis not present

## 2022-07-01 DIAGNOSIS — E782 Mixed hyperlipidemia: Secondary | ICD-10-CM | POA: Diagnosis not present

## 2022-07-01 DIAGNOSIS — H0520 Unspecified exophthalmos: Secondary | ICD-10-CM | POA: Diagnosis not present

## 2022-07-01 DIAGNOSIS — G720 Drug-induced myopathy: Secondary | ICD-10-CM | POA: Diagnosis not present

## 2022-07-01 DIAGNOSIS — R7303 Prediabetes: Secondary | ICD-10-CM | POA: Diagnosis not present

## 2022-07-01 DIAGNOSIS — Z79899 Other long term (current) drug therapy: Secondary | ICD-10-CM | POA: Diagnosis not present

## 2022-07-01 DIAGNOSIS — Z0000 Encounter for general adult medical examination without abnormal findings: Secondary | ICD-10-CM | POA: Diagnosis not present

## 2022-07-01 DIAGNOSIS — T466X5A Adverse effect of antihyperlipidemic and antiarteriosclerotic drugs, initial encounter: Secondary | ICD-10-CM | POA: Diagnosis not present

## 2022-07-01 DIAGNOSIS — E559 Vitamin D deficiency, unspecified: Secondary | ICD-10-CM | POA: Diagnosis not present

## 2022-07-01 DIAGNOSIS — I10 Essential (primary) hypertension: Secondary | ICD-10-CM | POA: Diagnosis not present

## 2022-07-30 DIAGNOSIS — E0500 Thyrotoxicosis with diffuse goiter without thyrotoxic crisis or storm: Secondary | ICD-10-CM | POA: Diagnosis not present

## 2022-08-20 DIAGNOSIS — E0500 Thyrotoxicosis with diffuse goiter without thyrotoxic crisis or storm: Secondary | ICD-10-CM | POA: Diagnosis not present

## 2022-08-23 DIAGNOSIS — Z79899 Other long term (current) drug therapy: Secondary | ICD-10-CM | POA: Diagnosis not present

## 2022-09-08 DIAGNOSIS — R079 Chest pain, unspecified: Secondary | ICD-10-CM | POA: Diagnosis not present

## 2022-09-08 DIAGNOSIS — I10 Essential (primary) hypertension: Secondary | ICD-10-CM | POA: Diagnosis not present

## 2022-09-08 DIAGNOSIS — R0602 Shortness of breath: Secondary | ICD-10-CM | POA: Diagnosis not present

## 2022-09-08 DIAGNOSIS — E782 Mixed hyperlipidemia: Secondary | ICD-10-CM | POA: Diagnosis not present

## 2022-09-10 DIAGNOSIS — H5789 Other specified disorders of eye and adnexa: Secondary | ICD-10-CM | POA: Diagnosis not present

## 2022-09-10 DIAGNOSIS — H02534 Eyelid retraction left upper eyelid: Secondary | ICD-10-CM | POA: Diagnosis not present

## 2022-09-10 DIAGNOSIS — E0500 Thyrotoxicosis with diffuse goiter without thyrotoxic crisis or storm: Secondary | ICD-10-CM | POA: Diagnosis not present

## 2022-10-01 DIAGNOSIS — E0500 Thyrotoxicosis with diffuse goiter without thyrotoxic crisis or storm: Secondary | ICD-10-CM | POA: Diagnosis not present

## 2022-10-14 DIAGNOSIS — I10 Essential (primary) hypertension: Secondary | ICD-10-CM | POA: Diagnosis not present

## 2022-10-14 DIAGNOSIS — E039 Hypothyroidism, unspecified: Secondary | ICD-10-CM | POA: Diagnosis not present

## 2022-10-14 DIAGNOSIS — E782 Mixed hyperlipidemia: Secondary | ICD-10-CM | POA: Diagnosis not present

## 2022-10-14 DIAGNOSIS — R7303 Prediabetes: Secondary | ICD-10-CM | POA: Diagnosis not present

## 2022-10-14 DIAGNOSIS — Z79899 Other long term (current) drug therapy: Secondary | ICD-10-CM | POA: Diagnosis not present

## 2022-10-18 ENCOUNTER — Inpatient Hospital Stay: Payer: PPO | Attending: Oncology

## 2022-10-18 DIAGNOSIS — Z170 Estrogen receptor positive status [ER+]: Secondary | ICD-10-CM | POA: Insufficient documentation

## 2022-10-18 DIAGNOSIS — C50212 Malignant neoplasm of upper-inner quadrant of left female breast: Secondary | ICD-10-CM | POA: Insufficient documentation

## 2022-10-18 DIAGNOSIS — R7401 Elevation of levels of liver transaminase levels: Secondary | ICD-10-CM | POA: Diagnosis not present

## 2022-10-18 DIAGNOSIS — C50919 Malignant neoplasm of unspecified site of unspecified female breast: Secondary | ICD-10-CM

## 2022-10-18 LAB — CMP (CANCER CENTER ONLY)
ALT: 69 U/L — ABNORMAL HIGH (ref 0–44)
AST: 60 U/L — ABNORMAL HIGH (ref 15–41)
Albumin: 3.8 g/dL (ref 3.5–5.0)
Alkaline Phosphatase: 42 U/L (ref 38–126)
Anion gap: 14 (ref 5–15)
BUN: 25 mg/dL — ABNORMAL HIGH (ref 8–23)
CO2: 28 mmol/L (ref 22–32)
Calcium: 9.4 mg/dL (ref 8.9–10.3)
Chloride: 94 mmol/L — ABNORMAL LOW (ref 98–111)
Creatinine: 1.13 mg/dL — ABNORMAL HIGH (ref 0.44–1.00)
GFR, Estimated: 51 mL/min — ABNORMAL LOW (ref >60)
Glucose, Bld: 230 mg/dL — ABNORMAL HIGH (ref 70–99)
Potassium: 3.7 mmol/L (ref 3.5–5.1)
Sodium: 136 mmol/L (ref 135–145)
Total Bilirubin: 0.4 mg/dL (ref 0.3–1.2)
Total Protein: 6.9 g/dL (ref 6.5–8.1)

## 2022-10-18 LAB — CBC WITH DIFFERENTIAL (CANCER CENTER ONLY)
Abs Immature Granulocytes: 0.01 10*3/uL (ref 0.00–0.07)
Basophils Absolute: 0 10*3/uL (ref 0.0–0.1)
Basophils Relative: 1 %
Eosinophils Absolute: 0.1 10*3/uL (ref 0.0–0.5)
Eosinophils Relative: 3 %
HCT: 39 % (ref 36.0–46.0)
Hemoglobin: 12.8 g/dL (ref 12.0–15.0)
Immature Granulocytes: 0 %
Lymphocytes Relative: 36 %
Lymphs Abs: 2 10*3/uL (ref 0.7–4.0)
MCH: 29.8 pg (ref 26.0–34.0)
MCHC: 32.8 g/dL (ref 30.0–36.0)
MCV: 90.7 fL (ref 80.0–100.0)
Monocytes Absolute: 0.4 10*3/uL (ref 0.1–1.0)
Monocytes Relative: 7 %
Neutro Abs: 3 10*3/uL (ref 1.7–7.7)
Neutrophils Relative %: 53 %
Platelet Count: 175 10*3/uL (ref 150–400)
RBC: 4.3 MIL/uL (ref 3.87–5.11)
RDW: 13.5 % (ref 11.5–15.5)
WBC Count: 5.6 10*3/uL (ref 4.0–10.5)
nRBC: 0 % (ref 0.0–0.2)

## 2022-10-19 LAB — CANCER ANTIGEN 15-3: CA 15-3: 20.9 U/mL (ref 0.0–25.0)

## 2022-10-19 LAB — CANCER ANTIGEN 27.29: CA 27.29: 17.1 U/mL (ref 0.0–38.6)

## 2022-10-21 ENCOUNTER — Encounter: Payer: Self-pay | Admitting: Oncology

## 2022-10-21 ENCOUNTER — Inpatient Hospital Stay: Payer: PPO | Attending: Oncology | Admitting: Oncology

## 2022-10-21 VITALS — BP 136/88 | HR 84 | Temp 96.6°F | Resp 18 | Wt 224.4 lb

## 2022-10-21 DIAGNOSIS — Z808 Family history of malignant neoplasm of other organs or systems: Secondary | ICD-10-CM | POA: Diagnosis not present

## 2022-10-21 DIAGNOSIS — C50212 Malignant neoplasm of upper-inner quadrant of left female breast: Secondary | ICD-10-CM | POA: Diagnosis not present

## 2022-10-21 DIAGNOSIS — Z79899 Other long term (current) drug therapy: Secondary | ICD-10-CM | POA: Diagnosis not present

## 2022-10-21 DIAGNOSIS — Z171 Estrogen receptor negative status [ER-]: Secondary | ICD-10-CM | POA: Insufficient documentation

## 2022-10-21 DIAGNOSIS — Z801 Family history of malignant neoplasm of trachea, bronchus and lung: Secondary | ICD-10-CM | POA: Diagnosis not present

## 2022-10-21 DIAGNOSIS — R7401 Elevation of levels of liver transaminase levels: Secondary | ICD-10-CM | POA: Insufficient documentation

## 2022-10-21 DIAGNOSIS — G629 Polyneuropathy, unspecified: Secondary | ICD-10-CM | POA: Insufficient documentation

## 2022-10-21 DIAGNOSIS — Z806 Family history of leukemia: Secondary | ICD-10-CM | POA: Insufficient documentation

## 2022-10-21 DIAGNOSIS — Z800 Family history of malignant neoplasm of digestive organs: Secondary | ICD-10-CM | POA: Diagnosis not present

## 2022-10-21 DIAGNOSIS — Z17421 Hormone receptor negative with human epidermal growth factor receptor 2 negative status: Secondary | ICD-10-CM

## 2022-10-21 DIAGNOSIS — C50919 Malignant neoplasm of unspecified site of unspecified female breast: Secondary | ICD-10-CM

## 2022-10-21 MED ORDER — GABAPENTIN 300 MG PO CAPS: 900.0000 mg | ORAL_CAPSULE | Freq: Three times a day (TID) | ORAL | 5 refills | Status: DC

## 2022-10-21 MED ORDER — GABAPENTIN 300 MG PO CAPS: 900.0000 mg | ORAL_CAPSULE | Freq: Two times a day (BID) | ORAL | 5 refills | Status: AC

## 2022-10-21 NOTE — Progress Notes (Signed)
Hematology/Oncology Progress note [phone] [phone]     REASON FOR VISIT:  Follow up for adjuvant chemotherapy for breast cancer.    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 Labs reviewed and discussed with patient.  She is 4.5 years post surgery. Cancer markers remain stable. Mammogram results reviewed and showed no signs of recurrence, continuing with monitoring. Continue annual mammogram.  Next due March 2025  Transaminitis due to fatty liver disease.  Stable.  Neuropathy Recommend patient to decrease gabapentin to 900 mg twice daily, due to worsening of kidney function. With current GFR, recommend not to exceed 1800 mg/day.  Orders Placed This Encounter  Procedures   MM 3D SCREENING MAMMOGRAM BILATERAL BREAST    Standing Status:   Future    Standing Expiration Date:   10/21/2023    Order Specific Question:   Reason for Exam (SYMPTOM  OR DIAGNOSIS REQUIRED)    Answer:   screening mammo    Order Specific Question:   Preferred imaging location?    Answer:    [facility]   CBC with Differential (Cancer Center Only)    Standing Status:   Future    Standing Expiration Date:   10/21/2023   CMP (Cancer Center only)    Standing Status:   Future    Standing Expiration Date:   10/21/2023   Cancer antigen 15-3    Standing Status:   Future    Standing Expiration Date:   10/21/2023   Cancer antigen 27.29    Standing Status:   Future    Standing Expiration  Date:   10/21/2023   6 months follow up All questions were answered. The patient knows to call the clinic with any problems, questions or concerns.  Rickard Patience, MD, PhD [facility] Hematology Oncology 10/21/2022     HISTORY OF PRESENTING ILLNESS:  Megan Harris is a  72 y.o.  female present for management of  breast cancer  # Stage IB left triple negative breast cancer   03/27/2018 which showed left breast mass. She had diagnostic and ultrasound on 04/07/2018 which showed 0.9cm x 0.9cm x 1.4cm irregular border mixed echotexture mass at the left breast 11 o'clock 7 cm from nipple correlating to the mammographic finding. Ultrasound of the left axilla is negative She underwent biopsy of left breast mass. 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 Estrogen receptor negative, progesterone receptor negative, HER-2 negative.  S/p left lumpectomy and sentinel lymph node biopsy. pT1pN0 05/09/2018 Baseline MUGA showed no focal wall motion abnormality of the left ventricle.  Calculated LVEF 61%  # 05/24/2018- 07/05/2018 Adjuvant Chemotherapy ddAC- # 07/19/2018 started on weekly Taxol 80 mg/m. Starting cycle 4, Taxol was decreased to 70mg /m2 # 08/23/2018 Cycle cycle 6, Taxol was decreased to 65 mg/m due to neuropathy. 10/04/2018 patient finished adjuvant chemotherapy with ddAC x 4 followed by 12 weekly Taxol. 12/13/2018, she finished adjuvant radiation.  #Patient was recommended for genetic testing and she  declined. Mediport was discontinued per patient's preference  # 03/29/2019 patient underwent annual diagnostic breast mammogram bilaterally. Mammogram showed new calcifications in the superior lateral right breast spanning 5 mm. Also there is an abnormal lymph node in the low left axilla representing a change from previous mammogram. 04/03/2019 patient underwent left axillary lymph node biopsy and pathology  is negative for malignancy, fibroadipose tissue with necrosis and calcifications, compatible with prior radiation therapy.  Lymph node tissue is not identified. 04/10/2019 breast right upper outer quadrant biopsy showed fibroadenomatoid changes with associated calcifications.  Negative for atypia and malignancy.  Seen by Dr. Lemar Livings recently.  10/23/2019 CT abdomen pelvis was obtained for evaluation of pelvic pain.CT showed mild circumferential bladder wall thickening, she follows with urologist and there is a plan for cystoscopy per patient.  CT also showed hepatic steatosis, 2.1 cm right adnexal cystic mass.  # 11/09/2019, colonoscopy was performed by Dr. Lemar Livings which showed diverticulosis in the rectosigmoid colon.  2 colon polyps was resected and retrieved.   01/21/2020 ultrasound pelvic showed small simple cyst in the right adnexa.  Patient was seen by Dr. Tiburcio Pea gynecology  INTERVAL HISTORY CRISTINA CENICEROS is a 72 y.o. female who has above history reviewed by me presents for follow-up of stage I left triple negative breast cancer Patient reports feeling well.  Denies any bone pain. No new concerns were reported. reported persistent neuropathy, described as a sensation of thickness in the feet, not numbness. Currently managing with gabapentin 900mg  2-3times daily.  And over-the-counter treatments including Voltaren creams. 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  HENT:  Negative for hearing loss.   Respiratory:  Negative for cough.   Cardiovascular:  Negative for chest pain.  Gastrointestinal:  Negative for abdominal pain, blood in stool and nausea.  Genitourinary:  Negative for frequency and urgency.  Skin:  Negative for itching and rash.  Neurological:  Negative for focal weakness.  Psychiatric/Behavioral:  The patient is not nervous/anxious.     MEDICAL HISTORY:   Past Medical History:  Diagnosis Date   Abdominal pain, left lower quadrant 2012   Abnormal LFTs    Anemia    vitamin d deficiency   Arthritis    Asthma    Back pain 2005   lower back arthritis   Bone cancer (HCC)    Breast cancer (HCC) 04/2018   left invasive mammary   Cancer (HCC) 04/26/2018   16mm, T1c, N0 triple negative   Chronic cholecystitis 2012   has had choleycystectomy.  not an issue   Chronic insomnia    Constipation 2005   COPD (chronic obstructive pulmonary disease) (HCC)    Degenerative disc disease, cervical    Diabetes mellitus without complication (HCC)    Diverticulitis    Endocrine problem 2005   Family history of bone cancer    Family history of brain cancer    Family history of colon cancer    Family history of lung cancer    GERD (gastroesophageal reflux disease) 2012   History of GI diverticular bleed    History of peptic ulcer disease    History of pneumonia    Hyperlipidemia 2012   Hypertension 2002   Nausea with vomiting 2012   Obesity, unspecified 2012   Personal history of chemotherapy    Personal history of radiation therapy 2020   invasive mammary   Pneumonia    PONV (  postoperative nausea and vomiting)    Special screening for malignant neoplasms, colon 2012   Vitamin D deficiency     SURGICAL HISTORY: Past Surgical History:  Procedure Laterality Date   BILATERAL CARPAL TUNNEL RELEASE Bilateral    BREAST BIOPSY Left 04/13/2018   invasive mamm and DCIS   BREAST BIOPSY Right 04/10/2019   calcs bx, ribbon marker, benign    BREAST BIOPSY Left 04/03/2019   LN bx with Dr Lemar Livings, FEATURES COMPATIBLE WITH PRIOR RADIATION THERAPY   BREAST EXCISIONAL BIOPSY Left early 90s   neg   BREAST LUMPECTOMY Left 04/26/2018   Procedure: BREAST LUMPECTOMY WITH EXCISION OF SENTINEL NODE;  Surgeon: Earline Mayotte, MD;  Location: [facility];  Service: General;  Laterality: Left;   BUNIONECTOMY Bilateral 2003   CHOLECYSTECTOMY  06/04/2010   COLON  RESECTION  2004   due diverticulitis    COLON SURGERY     COLONOSCOPY  2005   [facility], Dr. Lemar Livings   COLONOSCOPY  06/28/2012   COLONOSCOPY WITH PROPOFOL N/A 11/09/2019   Procedure: COLONOSCOPY WITH PROPOFOL;  Surgeon: Earline Mayotte, MD;  Location: [facility];  Service: Endoscopy;  Laterality: N/A;   COLOSTOMY  2004   COLOSTOMY REVERSAL  2004   FOOT SURGERY Bilateral 2012   plantar faciatis   HAND SURGERY Bilateral    carpal tunnel   HERNIA REPAIR  2005   at colostomy site after reversal done   JOINT REPLACEMENT     KNEE ARTHROPLASTY Right 04/20/2017   Procedure: COMPUTER ASSISTED TOTAL KNEE ARTHROPLASTY;  Surgeon: Donato Heinz, MD;  Location: [facility];  Service: Orthopedics;  Laterality: Right;   KNEE ARTHROSCOPY Right 02/03/2015   Procedure: ARTHROSCOPY right knee, partial medial menisectomy, condyle malleolus, patella and femoral;  Surgeon: Donato Heinz, MD;  Location: [facility];  Service: Orthopedics;  Laterality: Right;   MASTECTOMY Left    partial lumpectomy'   NASAL SINUS SURGERY     2005   PORTACATH PLACEMENT Right 04/26/2018   Procedure: INSERTION PORT-A-CATH RIGHT;  Surgeon: Earline Mayotte, MD;  Location: [facility];  Service: General;  Laterality: Right;   SALPINGOOPHORECTOMY  1998   status post colectomy     TONSILLECTOMY     TUBAL LIGATION  1978   UPPER GI ENDOSCOPY  06/28/2012   VAGINAL HYSTERECTOMY  1987    SOCIAL HISTORY: Social History   Socioeconomic History   Marital status: Married    Spouse name: Roger   Number of children: 2   Years of education: Not on file   Highest education level: Not on file  Occupational History   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    Comment: retired  Tobacco Use   Smoking status: Never   Smokeless tobacco: Never  Vaping Use   Vaping status: Never Used  Substance and Sexual Activity   Alcohol use: Not Currently   Drug use: No   Sexual activity: Yes    Birth control/protection: Surgical  Other Topics Concern    Not on file  Social History Narrative   Not on file   Social Determinants of Health   Financial Resource Strain: Not on file  Food Insecurity: Not on file  Transportation Needs: Not on file  Physical Activity: Not on file  Stress: Not on file  Social Connections: Not on file  Intimate Partner Violence: Not on file    FAMILY HISTORY: Family History  Problem Relation Age of Onset   Heart disease Father  Diabetes Paternal Grandmother    Diabetes Paternal Grandfather    Brain cancer Mother 36   Bone cancer Sister 70   Colon cancer Maternal Uncle        dx 50s-60s   Throat cancer Cousin    Leukemia Cousin    Lung cancer Cousin    Ovarian cancer Neg Hx    Breast cancer Neg Hx    Prostate cancer Neg Hx    Kidney cancer Neg Hx    Bladder Cancer Neg Hx     ALLERGIES:  is allergic to codeine, penicillins, sudafed [pseudoephedrine], and adhesive [tape].  MEDICATIONS:  Current Outpatient Medications  Medication Sig Dispense Refill   albuterol (VENTOLIN HFA) 108 (90 Base) MCG/ACT inhaler Inhale 1-2 puffs into the lungs every 6 (six) hours as needed for wheezing or shortness of breath. 1 g 0   amLODipine (NORVASC) 5 MG tablet Take 5 mg by mouth daily.     Calcium Carbonate-Vit D-Min (CALCIUM 1200 PO) Take by mouth.     lansoprazole (PREVACID) 30 MG capsule Take 30 mg by mouth 2 (two) times daily.      levothyroxine (SYNTHROID, LEVOTHROID) 50 MCG tablet Take 50 mcg by mouth daily before breakfast.      loratadine (CLARITIN) 10 MG tablet Take 10 mg by mouth daily.     metFORMIN (GLUCOPHAGE) 500 MG tablet TAKE 1 TABLET BY MOUTH 2 TIMES DAILY WITH MEALS.     Multiple Vitamins-Minerals (VITAMIN D3 COMPLETE PO) Take by mouth.     Potassium 99 MG TABS Take by mouth daily.     telmisartan-hydrochlorothiazide (MICARDIS HCT) 80-25 MG tablet Take 1 tablet by mouth daily.     Zinc Sulfate (ZINC 15 PO) Take by mouth.     zolpidem (AMBIEN) 10 MG tablet Take 10 mg by mouth at bedtime.      atorvastatin (LIPITOR) 10 MG tablet Take 10 mg by mouth daily. (Patient not taking: Reported on 10/21/2022)     gabapentin (NEURONTIN) 300 MG capsule Take 3 capsules (900 mg total) by mouth 2 (two) times daily. 180 capsule 5   No current facility-administered medications for this visit.     PHYSICAL EXAMINATION: ECOG PERFORMANCE STATUS: 1 - Symptomatic but completely ambulatory Vitals:   10/21/22 1401  BP: 136/88  Pulse: 84  Resp: 18  Temp: (!) 96.6 F (35.9 C)  SpO2: 98%   Filed Weights   10/21/22 1401  Weight: 224 lb 6.4 oz (101.8 kg)    Physical Exam Constitutional:      General: She is not in acute distress.    Appearance: She is obese.  HENT:     Head: Normocephalic and atraumatic.  Eyes:     General: No scleral icterus.    Pupils: Pupils are equal, round, and reactive to light.  Cardiovascular:     Rate and Rhythm: Normal rate and regular rhythm.     Heart sounds: Normal heart sounds.  Pulmonary:     Effort: Pulmonary effort is normal. No respiratory distress.     Breath sounds: No wheezing.  Abdominal:     General: Bowel sounds are normal. There is no distension.     Palpations: Abdomen is soft.  Musculoskeletal:        General: No deformity. Normal range of motion.     Cervical back: Normal range of motion and neck supple.  Skin:    General: Skin is warm and dry.     Findings: No erythema or rash.  Neurological:  Mental Status: She is alert and oriented to person, place, and time. Mental status is at baseline.     Cranial Nerves: No cranial nerve deficit.  Psychiatric:        Mood and Affect: Mood normal.     LABORATORY DATA:  I have reviewed the data as listed    Latest Ref Rng & Units 10/18/2022   10:46 AM 04/07/2022   11:01 AM 10/05/2021    9:23 AM  CBC  WBC 4.0 - 10.5 K/uL 5.6  6.1  11.1   Hemoglobin 12.0 - 15.0 g/dL 72.5  36.6  44.0   Hematocrit 36.0 - 46.0 % 39.0  40.6  40.3   Platelets 150 - 400 K/uL 175  214  203        Latest Ref  Rng & Units 10/18/2022   10:46 AM 04/07/2022   11:01 AM 10/05/2021    9:23 AM  CMP  Glucose 70 - 99 mg/dL 347  425  956   BUN 8 - 23 mg/dL 25  24  26    Creatinine 0.44 - 1.00 mg/dL 3.87  5.64  3.32   Sodium 135 - 145 mmol/L 136  138  138   Potassium 3.5 - 5.1 mmol/L 3.7  4.3  3.7   Chloride 98 - 111 mmol/L 94  99  99   CO2 22 - 32 mmol/L 28  28  30    Calcium 8.9 - 10.3 mg/dL 9.4  9.3  9.0   Total Protein 6.5 - 8.1 g/dL 6.9  7.3  6.7   Total Bilirubin 0.3 - 1.2 mg/dL 0.4  0.9  0.7   Alkaline Phos 38 - 126 U/L 42  56  46   AST 15 - 41 U/L 60  64  41   ALT 0 - 44 U/L 69  61  64       RADIOGRAPHIC STUDIES: I have personally reviewed the radiological images as listed and agreed with the findings in the report. No results found.

## 2022-10-21 NOTE — Assessment & Plan Note (Addendum)
#   Stage IB left triple negative breast cancer, lumpectomy with SLNB, adjuvant chemotherapy followed by adjuvant radiation. Labs reviewed and discussed with patient.  She is 4.5 years post surgery. Cancer markers remain stable. Mammogram results reviewed and showed no signs of recurrence, continuing with monitoring. Continue annual mammogram.  Next due March 2025

## 2022-10-21 NOTE — Assessment & Plan Note (Signed)
Recommend patient to decrease gabapentin to 900 mg twice daily, due to worsening of kidney function. With current GFR, recommend not to exceed 1800 mg/day.

## 2022-10-21 NOTE — Assessment & Plan Note (Signed)
due to fatty liver disease.  Stable. 

## 2022-10-22 DIAGNOSIS — E0500 Thyrotoxicosis with diffuse goiter without thyrotoxic crisis or storm: Secondary | ICD-10-CM | POA: Diagnosis not present

## 2022-10-25 DIAGNOSIS — N1831 Chronic kidney disease, stage 3a: Secondary | ICD-10-CM | POA: Diagnosis not present

## 2022-10-25 DIAGNOSIS — E782 Mixed hyperlipidemia: Secondary | ICD-10-CM | POA: Diagnosis not present

## 2022-10-25 DIAGNOSIS — I10 Essential (primary) hypertension: Secondary | ICD-10-CM | POA: Diagnosis not present

## 2022-10-25 DIAGNOSIS — R7303 Prediabetes: Secondary | ICD-10-CM | POA: Diagnosis not present

## 2022-10-25 DIAGNOSIS — E039 Hypothyroidism, unspecified: Secondary | ICD-10-CM | POA: Diagnosis not present

## 2022-10-25 DIAGNOSIS — Z23 Encounter for immunization: Secondary | ICD-10-CM | POA: Diagnosis not present

## 2022-10-25 DIAGNOSIS — E559 Vitamin D deficiency, unspecified: Secondary | ICD-10-CM | POA: Diagnosis not present

## 2022-11-12 DIAGNOSIS — E0500 Thyrotoxicosis with diffuse goiter without thyrotoxic crisis or storm: Secondary | ICD-10-CM | POA: Diagnosis not present

## 2022-12-03 DIAGNOSIS — E0500 Thyrotoxicosis with diffuse goiter without thyrotoxic crisis or storm: Secondary | ICD-10-CM | POA: Diagnosis not present

## 2022-12-24 DIAGNOSIS — E0500 Thyrotoxicosis with diffuse goiter without thyrotoxic crisis or storm: Secondary | ICD-10-CM | POA: Diagnosis not present

## 2022-12-27 DIAGNOSIS — I10 Essential (primary) hypertension: Secondary | ICD-10-CM | POA: Diagnosis not present

## 2022-12-27 DIAGNOSIS — E559 Vitamin D deficiency, unspecified: Secondary | ICD-10-CM | POA: Diagnosis not present

## 2022-12-27 DIAGNOSIS — R7303 Prediabetes: Secondary | ICD-10-CM | POA: Diagnosis not present

## 2022-12-27 DIAGNOSIS — E7800 Pure hypercholesterolemia, unspecified: Secondary | ICD-10-CM | POA: Diagnosis not present

## 2022-12-27 DIAGNOSIS — Z79899 Other long term (current) drug therapy: Secondary | ICD-10-CM | POA: Diagnosis not present

## 2023-01-27 DIAGNOSIS — H5789 Other specified disorders of eye and adnexa: Secondary | ICD-10-CM | POA: Diagnosis not present

## 2023-01-27 DIAGNOSIS — E7800 Pure hypercholesterolemia, unspecified: Secondary | ICD-10-CM | POA: Diagnosis not present

## 2023-01-27 DIAGNOSIS — Z79899 Other long term (current) drug therapy: Secondary | ICD-10-CM | POA: Diagnosis not present

## 2023-01-27 DIAGNOSIS — H02534 Eyelid retraction left upper eyelid: Secondary | ICD-10-CM | POA: Diagnosis not present

## 2023-01-27 DIAGNOSIS — R8290 Unspecified abnormal findings in urine: Secondary | ICD-10-CM | POA: Diagnosis not present

## 2023-01-27 DIAGNOSIS — R7303 Prediabetes: Secondary | ICD-10-CM | POA: Diagnosis not present

## 2023-01-27 DIAGNOSIS — R252 Cramp and spasm: Secondary | ICD-10-CM | POA: Diagnosis not present

## 2023-01-27 DIAGNOSIS — I10 Essential (primary) hypertension: Secondary | ICD-10-CM | POA: Diagnosis not present

## 2023-02-03 DIAGNOSIS — Z1231 Encounter for screening mammogram for malignant neoplasm of breast: Secondary | ICD-10-CM | POA: Diagnosis not present

## 2023-02-03 DIAGNOSIS — E782 Mixed hyperlipidemia: Secondary | ICD-10-CM | POA: Diagnosis not present

## 2023-02-03 DIAGNOSIS — E66812 Obesity, class 2: Secondary | ICD-10-CM | POA: Diagnosis not present

## 2023-02-03 DIAGNOSIS — N1831 Chronic kidney disease, stage 3a: Secondary | ICD-10-CM | POA: Diagnosis not present

## 2023-02-03 DIAGNOSIS — Z6837 Body mass index (BMI) 37.0-37.9, adult: Secondary | ICD-10-CM | POA: Diagnosis not present

## 2023-02-03 DIAGNOSIS — C50919 Malignant neoplasm of unspecified site of unspecified female breast: Secondary | ICD-10-CM | POA: Diagnosis not present

## 2023-02-03 DIAGNOSIS — Z17421 Hormone receptor negative with human epidermal growth factor receptor 2 negative status: Secondary | ICD-10-CM | POA: Diagnosis not present

## 2023-02-03 DIAGNOSIS — I10 Essential (primary) hypertension: Secondary | ICD-10-CM | POA: Diagnosis not present

## 2023-02-03 DIAGNOSIS — Z79899 Other long term (current) drug therapy: Secondary | ICD-10-CM | POA: Diagnosis not present

## 2023-02-03 DIAGNOSIS — R7303 Prediabetes: Secondary | ICD-10-CM | POA: Diagnosis not present

## 2023-02-03 DIAGNOSIS — E559 Vitamin D deficiency, unspecified: Secondary | ICD-10-CM | POA: Diagnosis not present

## 2023-02-03 DIAGNOSIS — Z0000 Encounter for general adult medical examination without abnormal findings: Secondary | ICD-10-CM | POA: Diagnosis not present

## 2023-03-08 DIAGNOSIS — L821 Other seborrheic keratosis: Secondary | ICD-10-CM | POA: Diagnosis not present

## 2023-03-08 DIAGNOSIS — D2261 Melanocytic nevi of right upper limb, including shoulder: Secondary | ICD-10-CM | POA: Diagnosis not present

## 2023-03-08 DIAGNOSIS — D2272 Melanocytic nevi of left lower limb, including hip: Secondary | ICD-10-CM | POA: Diagnosis not present

## 2023-03-08 DIAGNOSIS — D2262 Melanocytic nevi of left upper limb, including shoulder: Secondary | ICD-10-CM | POA: Diagnosis not present

## 2023-03-08 DIAGNOSIS — D2271 Melanocytic nevi of right lower limb, including hip: Secondary | ICD-10-CM | POA: Diagnosis not present

## 2023-03-08 DIAGNOSIS — D225 Melanocytic nevi of trunk: Secondary | ICD-10-CM | POA: Diagnosis not present

## 2023-03-09 DIAGNOSIS — E782 Mixed hyperlipidemia: Secondary | ICD-10-CM | POA: Diagnosis not present

## 2023-03-09 DIAGNOSIS — I10 Essential (primary) hypertension: Secondary | ICD-10-CM | POA: Diagnosis not present

## 2023-03-09 DIAGNOSIS — E118 Type 2 diabetes mellitus with unspecified complications: Secondary | ICD-10-CM | POA: Diagnosis not present

## 2023-04-18 ENCOUNTER — Ambulatory Visit
Admission: RE | Admit: 2023-04-18 | Discharge: 2023-04-18 | Disposition: A | Discharge status: Home / Self Care | Payer: PPO | Source: Ambulatory Visit | Attending: Oncology | Admitting: Oncology

## 2023-04-18 DIAGNOSIS — Z17421 Hormone receptor negative with human epidermal growth factor receptor 2 negative status: Secondary | ICD-10-CM | POA: Diagnosis not present

## 2023-04-18 DIAGNOSIS — Z1231 Encounter for screening mammogram for malignant neoplasm of breast: Secondary | ICD-10-CM | POA: Diagnosis not present

## 2023-04-18 DIAGNOSIS — C50919 Malignant neoplasm of unspecified site of unspecified female breast: Secondary | ICD-10-CM | POA: Diagnosis not present

## 2023-04-22 DIAGNOSIS — I10 Essential (primary) hypertension: Secondary | ICD-10-CM | POA: Diagnosis not present

## 2023-04-22 DIAGNOSIS — E782 Mixed hyperlipidemia: Secondary | ICD-10-CM | POA: Diagnosis not present

## 2023-04-22 DIAGNOSIS — Z79899 Other long term (current) drug therapy: Secondary | ICD-10-CM | POA: Diagnosis not present

## 2023-04-22 DIAGNOSIS — R7303 Prediabetes: Secondary | ICD-10-CM | POA: Diagnosis not present

## 2023-04-22 DIAGNOSIS — R8290 Unspecified abnormal findings in urine: Secondary | ICD-10-CM | POA: Diagnosis not present

## 2023-04-25 ENCOUNTER — Inpatient Hospital Stay: Payer: PPO | Attending: Oncology

## 2023-04-25 DIAGNOSIS — Z853 Personal history of malignant neoplasm of breast: Secondary | ICD-10-CM | POA: Insufficient documentation

## 2023-04-25 DIAGNOSIS — Z9221 Personal history of antineoplastic chemotherapy: Secondary | ICD-10-CM | POA: Diagnosis not present

## 2023-04-25 DIAGNOSIS — Z79899 Other long term (current) drug therapy: Secondary | ICD-10-CM | POA: Diagnosis not present

## 2023-04-25 DIAGNOSIS — Z08 Encounter for follow-up examination after completed treatment for malignant neoplasm: Secondary | ICD-10-CM | POA: Insufficient documentation

## 2023-04-25 DIAGNOSIS — Z17421 Hormone receptor negative with human epidermal growth factor receptor 2 negative status: Secondary | ICD-10-CM

## 2023-04-25 DIAGNOSIS — C50919 Malignant neoplasm of unspecified site of unspecified female breast: Secondary | ICD-10-CM | POA: Diagnosis not present

## 2023-04-25 DIAGNOSIS — Z923 Personal history of irradiation: Secondary | ICD-10-CM | POA: Insufficient documentation

## 2023-04-25 DIAGNOSIS — R198 Other specified symptoms and signs involving the digestive system and abdomen: Secondary | ICD-10-CM | POA: Diagnosis not present

## 2023-04-25 LAB — CMP (CANCER CENTER ONLY)
ALT: 38 U/L (ref 0–44)
AST: 36 U/L (ref 15–41)
Albumin: 3.7 g/dL (ref 3.5–5.0)
Alkaline Phosphatase: 47 U/L (ref 38–126)
Anion gap: 10 (ref 5–15)
BUN: 28 mg/dL — ABNORMAL HIGH (ref 8–23)
CO2: 27 mmol/L (ref 22–32)
Calcium: 9 mg/dL (ref 8.9–10.3)
Chloride: 97 mmol/L — ABNORMAL LOW (ref 98–111)
Creatinine: 1.11 mg/dL — ABNORMAL HIGH (ref 0.44–1.00)
GFR, Estimated: 52 mL/min — ABNORMAL LOW (ref >60)
Glucose, Bld: 156 mg/dL — ABNORMAL HIGH (ref 70–99)
Potassium: 3.7 mmol/L (ref 3.5–5.1)
Sodium: 134 mmol/L — ABNORMAL LOW (ref 135–145)
Total Bilirubin: 0.8 mg/dL (ref 0.0–1.2)
Total Protein: 7 g/dL (ref 6.5–8.1)

## 2023-04-25 LAB — CBC WITH DIFFERENTIAL (CANCER CENTER ONLY)
Abs Immature Granulocytes: 0.03 10*3/uL (ref 0.00–0.07)
Basophils Absolute: 0 10*3/uL (ref 0.0–0.1)
Basophils Relative: 1 %
Eosinophils Absolute: 0.1 10*3/uL (ref 0.0–0.5)
Eosinophils Relative: 2 %
HCT: 39.1 % (ref 36.0–46.0)
Hemoglobin: 12.7 g/dL (ref 12.0–15.0)
Immature Granulocytes: 0 %
Lymphocytes Relative: 34 %
Lymphs Abs: 2.3 10*3/uL (ref 0.7–4.0)
MCH: 30.4 pg (ref 26.0–34.0)
MCHC: 32.5 g/dL (ref 30.0–36.0)
MCV: 93.5 fL (ref 80.0–100.0)
Monocytes Absolute: 0.4 10*3/uL (ref 0.1–1.0)
Monocytes Relative: 6 %
Neutro Abs: 3.8 10*3/uL (ref 1.7–7.7)
Neutrophils Relative %: 57 %
Platelet Count: 213 10*3/uL (ref 150–400)
RBC: 4.18 MIL/uL (ref 3.87–5.11)
RDW: 13.8 % (ref 11.5–15.5)
WBC Count: 6.8 10*3/uL (ref 4.0–10.5)
nRBC: 0 % (ref 0.0–0.2)

## 2023-04-26 ENCOUNTER — Other Ambulatory Visit: Payer: PPO

## 2023-04-26 LAB — CANCER ANTIGEN 15-3: CA 15-3: 21 U/mL (ref 0.0–25.0)

## 2023-04-26 LAB — CANCER ANTIGEN 27.29: CA 27.29: 29.6 U/mL (ref 0.0–38.6)

## 2023-04-28 ENCOUNTER — Ambulatory Visit: Payer: PPO | Admitting: Oncology

## 2023-04-28 ENCOUNTER — Other Ambulatory Visit: Payer: PPO

## 2023-04-29 ENCOUNTER — Inpatient Hospital Stay: Payer: PPO | Admitting: Oncology

## 2023-04-29 ENCOUNTER — Encounter: Payer: Self-pay | Admitting: Oncology

## 2023-04-29 VITALS — BP 148/68 | HR 73 | Temp 96.6°F | Resp 97 | Wt 217.3 lb

## 2023-04-29 DIAGNOSIS — T466X5A Adverse effect of antihyperlipidemic and antiarteriosclerotic drugs, initial encounter: Secondary | ICD-10-CM | POA: Diagnosis not present

## 2023-04-29 DIAGNOSIS — G629 Polyneuropathy, unspecified: Secondary | ICD-10-CM

## 2023-04-29 DIAGNOSIS — Z17421 Hormone receptor negative with human epidermal growth factor receptor 2 negative status: Secondary | ICD-10-CM | POA: Diagnosis not present

## 2023-04-29 DIAGNOSIS — Z79899 Other long term (current) drug therapy: Secondary | ICD-10-CM | POA: Diagnosis not present

## 2023-04-29 DIAGNOSIS — Z6837 Body mass index (BMI) 37.0-37.9, adult: Secondary | ICD-10-CM | POA: Diagnosis not present

## 2023-04-29 DIAGNOSIS — C50919 Malignant neoplasm of unspecified site of unspecified female breast: Secondary | ICD-10-CM

## 2023-04-29 DIAGNOSIS — E66812 Obesity, class 2: Secondary | ICD-10-CM | POA: Diagnosis not present

## 2023-04-29 DIAGNOSIS — I10 Essential (primary) hypertension: Secondary | ICD-10-CM | POA: Diagnosis not present

## 2023-04-29 DIAGNOSIS — R7401 Elevation of levels of liver transaminase levels: Secondary | ICD-10-CM

## 2023-04-29 DIAGNOSIS — G720 Drug-induced myopathy: Secondary | ICD-10-CM | POA: Diagnosis not present

## 2023-04-29 DIAGNOSIS — E039 Hypothyroidism, unspecified: Secondary | ICD-10-CM | POA: Diagnosis not present

## 2023-04-29 DIAGNOSIS — R7303 Prediabetes: Secondary | ICD-10-CM | POA: Diagnosis not present

## 2023-04-29 DIAGNOSIS — E559 Vitamin D deficiency, unspecified: Secondary | ICD-10-CM | POA: Diagnosis not present

## 2023-04-29 DIAGNOSIS — E782 Mixed hyperlipidemia: Secondary | ICD-10-CM | POA: Diagnosis not present

## 2023-04-29 DIAGNOSIS — Z08 Encounter for follow-up examination after completed treatment for malignant neoplasm: Secondary | ICD-10-CM | POA: Diagnosis not present

## 2023-04-29 NOTE — Assessment & Plan Note (Addendum)
#   Stage IB left triple negative breast cancer, lumpectomy with SLNB, adjuvant chemotherapy followed by adjuvant radiation. Labs reviewed and discussed with patient.  She is 5 years post surgery. Cancer markers remain stable. Mammogram results reviewed and showed no signs of recurrence, continuing with monitoring. Continue annual mammogram.  Next due March 2026

## 2023-04-29 NOTE — Assessment & Plan Note (Addendum)
 gabapentin to 900 mg twice daily, due to worsening of kidney function. With current GFR, recommend not to exceed 1800 mg/day.

## 2023-04-30 ENCOUNTER — Encounter: Payer: Self-pay | Admitting: Oncology

## 2023-04-30 NOTE — Progress Notes (Signed)
 Hematology/Oncology Progress note [phone] [phone]     REASON FOR VISIT:  Follow up for adjuvant chemotherapy for breast cancer.    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 Labs reviewed and discussed with patient.  She is 5 years post surgery. Cancer markers remain stable. Mammogram results reviewed and showed no signs of recurrence, continuing with monitoring. Continue annual mammogram.  Next due March 2026  Neuropathy gabapentin to 900 mg twice daily, due to worsening of kidney function. With current GFR, recommend not to exceed 1800 mg/day.  Transaminitis due to fatty liver disease.  Resolved. Likely due to recent intentional weight loss.   Orders Placed This Encounter  Procedures   CBC with Differential (Cancer Center Only)    Standing Status:   Future    Expected Date:   04/28/2024    Expiration Date:   04/28/2024   CMP (Cancer Center only)    Standing Status:   Future    Expected Date:   04/28/2024    Expiration Date:   04/28/2024   Cancer antigen 15-3    Standing Status:   Future    Expected Date:   04/28/2024    Expiration Date:   04/28/2024   Cancer antigen 27.29    Standing Status:   Future    Expected Date:   04/28/2024    Expiration Date:   04/28/2024   6 months follow up All questions were answered. The patient knows to call the clinic with any problems, questions or concerns.  Timmy Forbes, MD, PhD [facility] Hematology Oncology 04/29/2023      HISTORY OF PRESENTING ILLNESS:  Megan Harris is a  73 y.o.  female present for management of  breast cancer  # Stage IB left triple negative breast cancer   03/27/2018 which showed left breast mass. She had diagnostic and ultrasound on 04/07/2018 which showed 0.9cm x 0.9cm x 1.4cm irregular border mixed echotexture mass at the left breast 11 o'clock 7 cm from nipple correlating to the mammographic finding. Ultrasound of the left axilla is negative She underwent biopsy of left breast mass. 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 Estrogen receptor negative, progesterone receptor negative, HER-2 negative.  S/p left lumpectomy and sentinel lymph node biopsy. pT1pN0 05/09/2018 Baseline MUGA showed no focal wall motion abnormality of the left ventricle.  Calculated LVEF 61%  # 05/24/2018- 07/05/2018 Adjuvant Chemotherapy ddAC- # 07/19/2018 started on weekly Taxol 80 mg/m. Starting cycle 4, Taxol was decreased to 70mg /m2 # 08/23/2018 Cycle cycle 6, Taxol was decreased to 65 mg/m due to neuropathy. 10/04/2018 patient finished adjuvant chemotherapy with ddAC x 4 followed by 12 weekly Taxol. 12/13/2018, she finished adjuvant radiation.  #Patient was recommended for genetic testing and she declined. Mediport was discontinued per patient's preference  # 03/29/2019 patient underwent annual diagnostic breast mammogram bilaterally. Mammogram showed new calcifications in the superior lateral right breast spanning 5 mm. Also there is an abnormal lymph  node in the low left axilla representing a change from previous mammogram. 04/03/2019 patient underwent left axillary lymph node biopsy and pathology is negative for malignancy, fibroadipose tissue with necrosis and calcifications, compatible with prior radiation therapy.  Lymph node tissue is not identified. 04/10/2019 breast right upper outer quadrant biopsy  showed fibroadenomatoid changes with associated calcifications.  Negative for atypia and malignancy.  Seen by Dr. Marquita Situ recently.  10/23/2019 CT abdomen pelvis was obtained for evaluation of pelvic pain.CT showed mild circumferential bladder wall thickening, she follows with urologist and there is a plan for cystoscopy per patient.  CT also showed hepatic steatosis, 2.1 cm right adnexal cystic mass.  # 11/09/2019, colonoscopy was performed by Dr. Marquita Situ which showed diverticulosis in the rectosigmoid colon.  2 colon polyps was resected and retrieved.   01/21/2020 ultrasound pelvic showed small simple cyst in the right adnexa.  Patient was seen by Dr. Raquel Cables gynecology   patient was diagnosed with thyroid eye disease, was on Teprotumumab, and now off treatment  [name] who has above history reviewed by me presents for follow-up of stage I left triple negative breast cancer Patient reports feeling well.  Denies any bone pain. No new concerns were reported. , neuropathy, described as a sensation of thickness in the feet, not numbness. Currently managing with gabapentin 900mg  2 times daily.  And over-the-counter treatments including Voltaren creams. She is on a course of antibiotics for UTI.  She has intentionally lost weight She was seen by Dr. Rosea Conch recently and had breast examination.   Review of Systems  Constitutional:  Negative for chills and fever.  HENT:  Negative for hearing loss.   Respiratory:  Negative for cough.   Cardiovascular:  Negative for chest pain.  Gastrointestinal:  Negative for abdominal pain, blood in stool and nausea.  Genitourinary:  Negative for frequency and urgency.  Skin:  Negative for itching and rash.  Neurological:  Negative for focal weakness.  Psychiatric/Behavioral:  The patient is not nervous/anxious.     MEDICAL HISTORY:  Past Medical History:  Diagnosis Date   Abdominal pain, left lower quadrant 2012    Abnormal LFTs    Anemia    vitamin d deficiency   Arthritis    Asthma    Back pain 2005   lower back arthritis   Bone cancer (HCC)    Breast cancer (HCC) 04/2018   left invasive mammary   Cancer (HCC) 04/26/2018   16mm, T1c, N0 triple negative   Chronic cholecystitis 2012   has had choleycystectomy.  not an issue   Chronic insomnia    Constipation 2005   COPD (chronic obstructive pulmonary disease) (HCC)    Degenerative disc disease, cervical    Diabetes mellitus without complication (HCC)    Diverticulitis    Endocrine problem 2005   Family history of bone cancer    Family history of brain cancer    Family history of colon cancer    Family history of lung cancer    GERD (gastroesophageal reflux disease) 2012   History of GI diverticular bleed    History of peptic ulcer disease    History of pneumonia    Hyperlipidemia 2012   Hypertension 2002   Nausea with vomiting 2012   Obesity, unspecified 2012   Personal history of chemotherapy    Personal history of radiation therapy 2020   invasive mammary   Pneumonia    PONV (postoperative nausea and vomiting)  Special screening for malignant neoplasms, colon 2012   Vitamin D deficiency     SURGICAL HISTORY: Past Surgical History:  Procedure Laterality Date   BILATERAL CARPAL TUNNEL RELEASE Bilateral    BREAST BIOPSY Left 04/13/2018   invasive mamm and DCIS   BREAST BIOPSY Right 04/10/2019   calcs bx, ribbon marker, benign    BREAST BIOPSY Left 04/03/2019   LN bx with Dr Marquita Situ, FEATURES COMPATIBLE WITH PRIOR RADIATION THERAPY   BREAST EXCISIONAL BIOPSY Left early 90s   neg   BREAST LUMPECTOMY Left 04/26/2018   Procedure: BREAST LUMPECTOMY WITH EXCISION OF SENTINEL NODE;  Surgeon: Marshall Skeeter, MD;  Location: [facility] ORS;  Service: General;  Laterality: Left;   BUNIONECTOMY Bilateral 2003   CHOLECYSTECTOMY  06/04/2010   COLON RESECTION  2004   due diverticulitis    COLON SURGERY     COLONOSCOPY  2005   [facility],  Dr. Marquita Situ   COLONOSCOPY  06/28/2012   COLONOSCOPY WITH PROPOFOL N/A 11/09/2019   Procedure: COLONOSCOPY WITH PROPOFOL;  Surgeon: Marshall Skeeter, MD;  Location: [facility];  Service: Endoscopy;  Laterality: N/A;   COLOSTOMY  2004   COLOSTOMY REVERSAL  2004   FOOT SURGERY Bilateral 2012   plantar faciatis   HAND SURGERY Bilateral    carpal tunnel   HERNIA REPAIR  2005   at colostomy site after reversal done   JOINT REPLACEMENT     KNEE ARTHROPLASTY Right 04/20/2017   Procedure: COMPUTER ASSISTED TOTAL KNEE ARTHROPLASTY;  Surgeon: Arlyne Lame, MD;  Location: [facility] ORS;  Service: Orthopedics;  Laterality: Right;   KNEE ARTHROSCOPY Right 02/03/2015   Procedure: ARTHROSCOPY right knee, partial medial menisectomy, condyle malleolus, patella and femoral;  Surgeon: Arlyne Lame, MD;  Location: [facility] OR[facility];  Service: Orthopedics;  Laterality: Right;   MASTECTOMY Left    partial lumpectomy'   NASAL SINUS SURGERY     2005   PORTACATH PLACEMENT Right 04/26/2018   Procedure: INSERTION PORT-A-CATH RIGHT;  Surgeon: Marshall Skeeter, MD;  Location: [facility] OR[facility];  Service: General;  Laterality: Right;   SALPINGOOPHORECTOMY  1998   status post colectomy     TONSILLECTOMY     TUBAL LIGATION  1978   UPPER GI ENDOSCOPY  06/28/2012   VAGINAL HYSTERECTOMY  1987    SOCIAL HISTORY: Social History   Socioeconomic History   Marital status: Married    Spouse name: Roger   Number of children: 2   Years of education: Not on file   Highest education level: Not on file  Occupational History   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    Comment: retired  Tobacco Use   Smoking status: Never   Smokeless tobacco: Never  Vaping Use   Vaping status: Never Used  Substance and Sexual Activity   Alcohol use: Not Currently   Drug use: No   Sexual activity: Yes    Birth control/protection: Surgical  Other Topics Concern   Not on file  Social History Narrative   Not on file   Social Drivers of Health    Financial Resource Strain: Low Risk  (04/29/2023)   Received from [facility]   Overall Financial Resource Strain (CARDIA)    Difficulty of Paying Living Expenses: Not hard at all  Food Insecurity: No Food Insecurity (04/29/2023)   Received from [facility]   Hunger Vital Sign    Worried About Running Out of Food in the Last Year: Never true  Ran Out of Food in the Last Year: Never true  Transportation Needs: No Transportation Needs (04/29/2023)   Received from [facility] - Transportation    In the past 12 months, has lack of transportation kept you from medical appointments or from getting medications?: No    Lack of Transportation (Non-Medical): No  Physical Activity: Not on file  Stress: Not on file  Social Connections: Not on file  Intimate Partner Violence: Not on file    FAMILY HISTORY: Family History  Problem Relation Age of Onset   Heart disease Father    Diabetes Paternal Grandmother    Diabetes Paternal Grandfather    Brain cancer Mother 66   Bone cancer Sister 84   Colon cancer Maternal Uncle        dx 50s-60s   Throat cancer Cousin    Leukemia Cousin    Lung cancer Cousin    Ovarian cancer Neg Hx    Breast cancer Neg Hx    Prostate cancer Neg Hx    Kidney cancer Neg Hx    Bladder Cancer Neg Hx     ALLERGIES:  is allergic to codeine, penicillins, sudafed [pseudoephedrine], and adhesive [tape].  MEDICATIONS:  Current Outpatient Medications  Medication Sig Dispense Refill   albuterol (VENTOLIN HFA) 108 (90 Base) MCG/ACT inhaler Inhale 1-2 puffs into the lungs every 6 (six) hours as needed for wheezing or shortness of breath. 1 g 0   amLODipine (NORVASC) 5 MG tablet Take 5 mg by mouth daily.     Calcium Carbonate-Vit D-Min (CALCIUM 1200 PO) Take by mouth.     gabapentin (NEURONTIN) 300 MG capsule Take 3 capsules (900 mg total) by mouth 2 (two) times daily. 180 capsule 5   lansoprazole  (PREVACID) 30 MG capsule Take 30 mg by mouth 2 (two) times daily.      loratadine (CLARITIN) 10 MG tablet Take 10 mg by mouth daily.     metFORMIN (GLUCOPHAGE) 500 MG tablet TAKE 1 TABLET BY MOUTH 2 TIMES DAILY WITH MEALS.     Multiple Vitamins-Minerals (VITAMIN D3 COMPLETE PO) Take by mouth.     Potassium 99 MG TABS Take by mouth daily.     sulfamethoxazole-trimethoprim (BACTRIM DS) 800-160 MG tablet Take by mouth.     telmisartan-hydrochlorothiazide (MICARDIS HCT) 80-25 MG tablet Take 1 tablet by mouth daily.     Zinc Sulfate (ZINC 15 PO) Take by mouth.     zolpidem (AMBIEN) 10 MG tablet Take 10 mg by mouth at bedtime.     atorvastatin (LIPITOR) 10 MG tablet Take 10 mg by mouth daily. (Patient not taking: Reported on 10/21/2022)     levothyroxine (SYNTHROID, LEVOTHROID) 50 MCG tablet Take 50 mcg by mouth daily before breakfast.  (Patient not taking: Reported on 04/29/2023)     No current facility-administered medications for this visit.     PHYSICAL EXAMINATION: ECOG PERFORMANCE STATUS: 1 - Symptomatic but completely ambulatory Vitals:   04/29/23 1230  BP: (!) 148/68  Pulse: 73  Resp: (!) 97  Temp: (!) 96.6 F (35.9 C)  SpO2: (!) 18%   Filed Weights   04/29/23 1230  Weight: 217 lb 4.8 oz (98.6 kg)    Physical Exam Constitutional:      General: She is not in acute distress.    Appearance: She is obese.  HENT:     Head: Normocephalic and atraumatic.  Eyes:     General: No scleral icterus. Cardiovascular:  Rate and Rhythm: Normal rate and regular rhythm.     Heart sounds: Normal heart sounds.  Pulmonary:     Effort: Pulmonary effort is normal. No respiratory distress.     Breath sounds: Normal breath sounds. No wheezing.  Abdominal:     General: Bowel sounds are normal. There is no distension.     Palpations: Abdomen is soft.  Musculoskeletal:        General: Normal range of motion.     Cervical back: Normal range of motion and neck supple.  Skin:    General: Skin  is warm and dry.     Findings: No erythema or rash.  Neurological:     Mental Status: She is alert and oriented to person, place, and time. Mental status is at baseline.  Psychiatric:        Mood and Affect: Mood normal.     LABORATORY DATA:  I have reviewed the data as listed    Latest Ref Rng & Units 04/25/2023    9:47 AM 10/18/2022   10:46 AM 04/07/2022   11:01 AM  CBC  WBC 4.0 - 10.5 K/uL 6.8  5.6  6.1   Hemoglobin 12.0 - 15.0 g/dL 46.9  62.9  52.8   Hematocrit 36.0 - 46.0 % 39.1  39.0  40.6   Platelets 150 - 400 K/uL 213  175  214        Latest Ref Rng & Units 04/25/2023    9:47 AM 10/18/2022   10:46 AM 04/07/2022   11:01 AM  CMP  Glucose 70 - 99 mg/dL 413  244  010   BUN 8 - 23 mg/dL 28  25  24    Creatinine 0.44 - 1.00 mg/dL 2.72  5.36  6.44   Sodium 135 - 145 mmol/L 134  136  138   Potassium 3.5 - 5.1 mmol/L 3.7  3.7  4.3   Chloride 98 - 111 mmol/L 97  94  99   CO2 22 - 32 mmol/L 27  28  28    Calcium 8.9 - 10.3 mg/dL 9.0  9.4  9.3   Total Protein 6.5 - 8.1 g/dL 7.0  6.9  7.3   Total Bilirubin 0.0 - 1.2 mg/dL 0.8  0.4  0.9   Alkaline Phos 38 - 126 U/L 47  42  56   AST 15 - 41 U/L 36  60  64   ALT 0 - 44 U/L 38  69  61       RADIOGRAPHIC STUDIES: I have personally reviewed the radiological images as listed and agreed with the findings in the report. MM 3D SCREENING MAMMOGRAM BILATERAL BREAST Result Date: 04/19/2023 CLINICAL DATA:  Screening. EXAM: DIGITAL SCREENING BILATERAL MAMMOGRAM WITH TOMOSYNTHESIS AND CAD TECHNIQUE: Bilateral screening digital craniocaudal and mediolateral oblique mammograms were obtained. Bilateral screening digital breast tomosynthesis was performed. The images were evaluated with computer-aided detection. COMPARISON:  Previous exam(s). ACR Breast Density Category a: The breasts are almost entirely fatty. FINDINGS: There are no findings suspicious for malignancy. IMPRESSION: No mammographic evidence of malignancy. A result letter of this screening  mammogram will be mailed directly to the patient. RECOMMENDATION: Screening mammogram in one year. (Code:SM-B-01Y) BI-RADS CATEGORY  1: Negative. Electronically Signed   By: Roda Cirri M.D.   On: 04/19/2023 16:33

## 2023-04-30 NOTE — Assessment & Plan Note (Signed)
 due to fatty liver disease.  Resolved. Likely due to recent intentional weight loss.

## 2023-05-05 DIAGNOSIS — H02534 Eyelid retraction left upper eyelid: Secondary | ICD-10-CM | POA: Diagnosis not present

## 2023-05-05 DIAGNOSIS — H0520 Unspecified exophthalmos: Secondary | ICD-10-CM | POA: Diagnosis not present

## 2023-05-05 DIAGNOSIS — H5789 Other specified disorders of eye and adnexa: Secondary | ICD-10-CM | POA: Diagnosis not present

## 2023-05-11 ENCOUNTER — Encounter
Admission: RE | Disposition: A | Discharge status: Home / Self Care | Payer: Self-pay | Source: Home / Self Care | Attending: Surgery

## 2023-05-11 ENCOUNTER — Ambulatory Visit
Admission: RE | Admit: 2023-05-11 | Discharge: 2023-05-11 | Disposition: A | Discharge status: Home / Self Care | Attending: Surgery | Admitting: Surgery

## 2023-05-11 ENCOUNTER — Encounter: Payer: Self-pay | Admitting: Surgery

## 2023-05-11 ENCOUNTER — Ambulatory Visit

## 2023-05-11 DIAGNOSIS — K649 Unspecified hemorrhoids: Secondary | ICD-10-CM | POA: Diagnosis not present

## 2023-05-11 DIAGNOSIS — K644 Residual hemorrhoidal skin tags: Secondary | ICD-10-CM | POA: Diagnosis not present

## 2023-05-11 DIAGNOSIS — D122 Benign neoplasm of ascending colon: Secondary | ICD-10-CM | POA: Insufficient documentation

## 2023-05-11 DIAGNOSIS — K219 Gastro-esophageal reflux disease without esophagitis: Secondary | ICD-10-CM | POA: Diagnosis not present

## 2023-05-11 DIAGNOSIS — K5730 Diverticulosis of large intestine without perforation or abscess without bleeding: Secondary | ICD-10-CM | POA: Insufficient documentation

## 2023-05-11 DIAGNOSIS — E119 Type 2 diabetes mellitus without complications: Secondary | ICD-10-CM | POA: Insufficient documentation

## 2023-05-11 DIAGNOSIS — Z1211 Encounter for screening for malignant neoplasm of colon: Secondary | ICD-10-CM | POA: Diagnosis not present

## 2023-05-11 DIAGNOSIS — Z860100 Personal history of colon polyps, unspecified: Secondary | ICD-10-CM | POA: Diagnosis not present

## 2023-05-11 DIAGNOSIS — Z7984 Long term (current) use of oral hypoglycemic drugs: Secondary | ICD-10-CM | POA: Insufficient documentation

## 2023-05-11 DIAGNOSIS — K635 Polyp of colon: Secondary | ICD-10-CM | POA: Diagnosis not present

## 2023-05-11 DIAGNOSIS — I10 Essential (primary) hypertension: Secondary | ICD-10-CM | POA: Insufficient documentation

## 2023-05-11 DIAGNOSIS — J4489 Other specified chronic obstructive pulmonary disease: Secondary | ICD-10-CM | POA: Diagnosis not present

## 2023-05-11 DIAGNOSIS — K640 First degree hemorrhoids: Secondary | ICD-10-CM | POA: Insufficient documentation

## 2023-05-11 HISTORY — PX: COLONOSCOPY: SHX5424

## 2023-05-11 HISTORY — PX: POLYPECTOMY: SHX149

## 2023-05-11 LAB — GLUCOSE, CAPILLARY: Glucose-Capillary: 144 mg/dL — ABNORMAL HIGH (ref 70–99)

## 2023-05-11 SURGERY — COLONOSCOPY
Anesthesia: General | Site: Rectum

## 2023-05-11 MED ORDER — PROPOFOL 10 MG/ML IV BOLUS
INTRAVENOUS | Status: DC | PRN
  Administered 2023-05-11: 90 mg via INTRAVENOUS
  Administered 2023-05-11 (×15): 10 mg via INTRAVENOUS

## 2023-05-11 MED ORDER — LIDOCAINE HCL (PF) 2 % IJ SOLN
INTRAMUSCULAR | Status: DC | PRN
  Administered 2023-05-11: 80 mg via INTRADERMAL

## 2023-05-11 MED ORDER — LIDOCAINE HCL (PF) 2 % IJ SOLN
INTRAMUSCULAR | Status: AC
  Filled 2023-05-11: qty 10

## 2023-05-11 MED ORDER — PROPOFOL 10 MG/ML IV BOLUS
INTRAVENOUS | Status: AC
  Filled 2023-05-11: qty 40

## 2023-05-11 MED ORDER — SODIUM CHLORIDE 0.9 % IV SOLN: INTRAVENOUS | Status: DC

## 2023-05-11 NOTE — Anesthesia Postprocedure Evaluation (Signed)
 Anesthesia Post Note  Patient: AMYE GREGO  Procedure(s) Performed: COLONOSCOPY (Rectum) POLYPECTOMY, INTESTINE  Patient location during evaluation: Endoscopy Anesthesia Type: General Level of consciousness: awake and alert Pain management: pain level controlled Vital Signs Assessment: post-procedure vital signs reviewed and stable Respiratory status: spontaneous breathing, nonlabored ventilation, respiratory function stable and patient connected to nasal cannula oxygen Cardiovascular status: blood pressure returned to baseline and stable Postop Assessment: no apparent nausea or vomiting Anesthetic complications: no   No notable events documented.   Last Vitals:  Vitals:   05/11/23 0814 05/11/23 0824  BP: (!) 164/80 (!) 185/90  Pulse:    Resp:    Temp:    SpO2:      Last Pain:  Vitals:   05/11/23 0824  TempSrc:   PainSc: 0-No pain                 Portia Brittle Domnique Vanegas

## 2023-05-11 NOTE — Transfer of Care (Signed)
 Immediate Anesthesia Transfer of Care Note  Patient: Megan Harris  Procedure(s) Performed: COLONOSCOPY (Rectum) POLYPECTOMY, INTESTINE  Patient Location: Endoscopy Unit  Anesthesia Type:General  Level of Consciousness: awake, alert , and oriented  Airway & Oxygen Therapy: Patient Spontanous Breathing  Post-op Assessment: Report given to RN and Post -op Vital signs reviewed and stable  Post vital signs: Reviewed and stable  Last Vitals:  Vitals Value Taken Time  BP 124/63 05/11/23 0805  Temp 36.1 C 05/11/23 0804  Pulse 62 05/11/23 0806  Resp 16 05/11/23 0806  SpO2 97 % 05/11/23 0806  Vitals shown include unfiled device data.  Last Pain:  Vitals:   05/11/23 0804  TempSrc: Temporal  PainSc: 0-No pain         Complications: No notable events documented.

## 2023-05-11 NOTE — Interval H&P Note (Signed)
 No change. OK to proceed.

## 2023-05-11 NOTE — H&P (Signed)
 Subjective:   CC: 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 (CMS/HHS-HCC) [C50.919, Z17.421] HPI: Megan Harris is a 73 y.o. female who returns for above. No specific complaints. Still has some soreness in left breat with palpation.   Past Medical History: has a past medical history of Abnormal LFTs, Asthma without status asthmaticus (HHS-HCC), Benign essential HTN, Breast cancer (CMS/HHS-HCC) (04/26/2018), Chronic insomnia, COPD (chronic obstructive pulmonary disease) (CMS/HHS-HCC), Degenerative disc disease, cervical, Diverticulitis (2005), History of GI diverticular bleed, History of peptic ulcer disease, History of pneumonia, Hyperlipidemia, Type 2 diabetes mellitus (CMS/HHS-HCC), and Vitamin D  deficiency.  Past Surgical History: has a past surgical history that includes status post partial colectomy (2005); Cholecystectomy; hysterectomy/bilateral oophorectomy; Right knee arthroscopy, partial medial meniscectomy, and chondroplasy (02/03/2015); Right total knee arthroplasty using computer-assisted navigation (04/20/2017); Mastectomy partial / lumpectomy (Left, 2020); history of hernia repair; Colostomy (2005); Colonoscopy (2014); and Colonoscopy (11/09/2019).  Family History: family history includes Bone cancer in her sister; Colon cancer in her maternal uncle; Myocardial Infarction (Heart attack) in her father; Other in her mother.  Social History: reports that she has never smoked. She has never used smokeless tobacco. She reports that she does not drink alcohol and does not use drugs.  Current Medications: has a current medication list which includes the following prescription(s): ezetimibe, gabapentin , loratadine , metformin, promethazine , telmisartan-hydrochlorothiazide , tepezza , and zolpidem .  Allergies:  Allergies as of 04/25/2023 - Reviewed 04/25/2023  Allergen Reaction Noted  Pseudoephedrine Other (See Comments) 02/27/2021  Adhesive tape-silicones Dermatitis 01/21/2015  Codeine  Other (See Comments) 11/27/2013  Penicillin Rash 11/27/2013   ROS:  A 15 point review of systems was performed and was negative except as noted in HPI  Objective:    BP (!) 148/97  Pulse 81  Ht 162.6 cm (5\' 4" )  Wt 98.9 kg (218 lb)  BMI 37.42 kg/m   Constitutional : No distress, cooperative, alert  Lymphatics/Throat: Supple with no lymphadenopathy  Respiratory: Clear to auscultation bilaterally  Cardiovascular: Regular rate and rhythm  Gastrointestinal: Soft, non-tender, non-distended, no organomegaly.  Musculoskeletal: Steady gait and movement  Skin: Cool and moist,  Psychiatric: Normal affect, non-agitated, not confused  Breast: Normal appearance and no palpable abnormality in right breast and axilla. Left breast with minor radiation changes and incisional scar. Port scar noted mid sternum. Axilla unremarkable. Chaperone present for exam.    LABS:  N/a  RADS: CLINICAL DATA: Screening.   EXAM:  DIGITAL SCREENING BILATERAL MAMMOGRAM WITH TOMOSYNTHESIS AND CAD   TECHNIQUE:  Bilateral screening digital craniocaudal and mediolateral oblique  mammograms were obtained. Bilateral screening digital breast  tomosynthesis was performed. The images were evaluated with  computer-aided detection.   COMPARISON: Previous exam(s).   ACR Breast Density Category a: The breasts are almost entirely  fatty.   FINDINGS:  There are no findings suspicious for malignancy.   IMPRESSION:  No mammographic evidence of malignancy. A result letter of this  screening mammogram will be mailed directly to the patient.   RECOMMENDATION:  Screening mammogram in one year. (Code:SM-B-01Y)   BI-RADS CATEGORY 1: Negative.   Electronically Signed  By: Roda Cirri M.D.  On: 04/19/2023 16:33   Assessment:   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 (CMS/HHS-HCC) [C50.919, Z17.421]  Doing well posttreatment of triple negative cancer of the left breast.  Past history colonic polyps,  repeat endoscopy due 2026, but due to change in bowel habits recently, will proceed with colonoscopy now.  Past history diverticulitis of the sigmoid requiring temporary colostomy.  Plan:    1.  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 (CMS/HHS-HCC) [C50.919, Z17.421] Doing well. Continue annual mammogram. F/u one year.   Past history colonic polyps, repeat endoscopy due 2026, but due to change in bowel habits recently, will proceed with colonoscopy now. R/b/a discussed. Risks include bleeding, perforation. Benefits include diagnostic, curative procedure if needed. Alternatives include continued observation. Pt verbalized understanding.  labs/images/medications/previous chart entries reviewed personally and relevant changes/updates noted above.

## 2023-05-11 NOTE — Op Note (Signed)
 [facility] Patient Name: Megan Harris Procedure Date: 05/11/2023 7:37 AM MRN: [id number] Account #: [id number] Date of Birth: 11-Feb-1950 Admit Type: Outpatient Age: 73 Room: [facility] ENDO ROOM 2 Gender: Female Note Status: Finalized Instrument Name: Charlyn Cooley [id number] Procedure:             Colonoscopy Indications:           High risk colon cancer surveillance: Personal history                         of colonic polyps Providers:             Conrado Delay MD, MD Medicines:             Propofol  per Anesthesia Complications:         No immediate complications. Procedure:             Pre-Anesthesia Assessment:                        - After reviewing the risks and benefits, the patient                         was deemed in satisfactory condition to undergo the                         procedure.                        After obtaining informed consent, the colonoscope was                         passed under direct vision. Throughout the procedure,                         the patient's blood pressure, pulse, and oxygen                         saturations were monitored continuously. The                         Colonoscope was introduced through the anus and                         advanced to the the cecum, identified by the ileocecal                         valve. The colonoscopy was performed without                         difficulty. The patient tolerated the procedure well.                         The quality of the bowel preparation was good. Findings:      Skin tags were found on perianal exam.      A 2 mm polyp was found in the distal ascending colon. The polyp was       sessile. Biopsies were taken with a cold forceps for histology.       Estimated blood loss was minimal.      A few small-mouthed diverticula were found in the  sigmoid colon and       descending colon.      Non-bleeding internal hemorrhoids were found during retroflexion. The        hemorrhoids were Grade I (internal hemorrhoids that do not prolapse). Impression:            - Perianal skin tags found on perianal exam.                        - One 2 mm polyp in the distal ascending colon.                         Biopsied.                        - Diverticulosis in the sigmoid colon and in the                         descending colon.                        - Non-bleeding internal hemorrhoids. Recommendation:        - Await pathology results.                        - Written discharge instructions were provided to the                         patient.                        - Discharge patient to home.                        - Resume previous diet. Procedure Code(s):     --- Professional ---                        [phone], Colonoscopy, flexible; with biopsy, single or                         multiple Diagnosis Code(s):     --- Professional ---                        Z86.010, Personal history of colonic polyps                        D12.2, Benign neoplasm of ascending colon                        K64.0, First degree hemorrhoids                        K64.4, Residual hemorrhoidal skin tags                        K57.30, Diverticulosis of large intestine without                         perforation or abscess without bleeding CPT copyright 2022 [employer]. All rights reserved. The codes documented in this report are preliminary and upon coder review may  be revised to meet current compliance  requirements. Dr. Ward Guy, MD Conrado Delay MD, MD 05/11/2023 8:05:51 AM This report has been signed electronically. Number of Addenda: 0 Note Initiated On: 05/11/2023 7:37 AM Scope Withdrawal Time: 0 hours 7 minutes 14 seconds  Total Procedure Duration: 0 hours 11 minutes 17 seconds  Estimated Blood Loss:  Estimated blood loss was minimal.      [facility]

## 2023-05-11 NOTE — Anesthesia Preprocedure Evaluation (Signed)
 Anesthesia Evaluation  Patient identified by MRN, date of birth, ID band Patient awake    Reviewed: Allergy & Precautions, NPO status , Patient's Chart, lab work & pertinent test results  History of Anesthesia Complications (+) PONV and history of anesthetic complications  Airway Mallampati: III  TM Distance: <3 FB Neck ROM: full    Dental  (+) Chipped   Pulmonary shortness of breath and with exertion, asthma , COPD   Pulmonary exam normal        Cardiovascular Exercise Tolerance: Good hypertension, negative cardio ROS Normal cardiovascular exam     Neuro/Psych  Neuromuscular disease negative neurological ROS  negative psych ROS   GI/Hepatic Neg liver ROS,GERD  ,,  Endo/Other  diabetes, Type 2    Renal/GU negative Renal ROS  negative genitourinary   Musculoskeletal   Abdominal   Peds  Hematology negative hematology ROS (+)   Anesthesia Other Findings Past Medical History: 2012: Abdominal pain, left lower quadrant No date: Abnormal LFTs No date: Anemia     Comment:  vitamin d  deficiency No date: Arthritis No date: Asthma 2005: Back pain     Comment:  lower back arthritis No date: Bone cancer (HCC) 04/2018: Breast cancer (HCC)     Comment:  left invasive mammary 04/26/2018: Cancer (HCC)     Comment:  16mm, T1c, N0 triple negative 2012: Chronic cholecystitis     Comment:  has had choleycystectomy.  not an issue No date: Chronic insomnia 2005: Constipation No date: COPD (chronic obstructive pulmonary disease) (HCC) No date: Degenerative disc disease, cervical No date: Diabetes mellitus without complication (HCC) No date: Diverticulitis 2005: Endocrine problem No date: Family history of bone cancer No date: Family history of brain cancer No date: Family history of colon cancer No date: Family history of lung cancer 2012: GERD (gastroesophageal reflux disease) No date: History of GI diverticular  bleed No date: History of peptic ulcer disease No date: History of pneumonia 2012: Hyperlipidemia 2002: Hypertension 2012: Nausea with vomiting 2012: Obesity, unspecified No date: Personal history of chemotherapy 2020: Personal history of radiation therapy     Comment:  invasive mammary No date: Pneumonia No date: PONV (postoperative nausea and vomiting) 2012: Special screening for malignant neoplasms, colon No date: Vitamin D  deficiency  Past Surgical History: No date: BILATERAL CARPAL TUNNEL RELEASE; Bilateral 04/13/2018: BREAST BIOPSY; Left     Comment:  invasive mamm and DCIS 04/10/2019: BREAST BIOPSY; Right     Comment:  calcs bx, ribbon marker, benign  04/03/2019: BREAST BIOPSY; Left     Comment:  LN bx with Dr Marquita Situ, FEATURES COMPATIBLE WITH PRIOR               RADIATION THERAPY early 90s: BREAST EXCISIONAL BIOPSY; Left     Comment:  neg 04/26/2018: BREAST LUMPECTOMY; Left     Comment:  Procedure: BREAST LUMPECTOMY WITH EXCISION OF SENTINEL               NODE;  Surgeon: Marshall Skeeter, MD;  Location: [facility]               ORS;  Service: General;  Laterality: Left; 2003: BUNIONECTOMY; Bilateral 06/04/2010: CHOLECYSTECTOMY 2004: COLON RESECTION     Comment:  due diverticulitis  No date: COLON SURGERY 2005: COLONOSCOPY     Comment:  [facility], Dr. Marquita Situ 06/28/2012: COLONOSCOPY 11/09/2019: COLONOSCOPY WITH PROPOFOL ; N/A     Comment:  Procedure: COLONOSCOPY WITH PROPOFOL ;  Surgeon: Marquita Situ,  Magali Schmitz, MD;  Location: [facility] ENDOSCOPY;  Service:               Endoscopy;  Laterality: N/A; 2004: COLOSTOMY 2004: COLOSTOMY REVERSAL 2012: FOOT SURGERY; Bilateral     Comment:  plantar faciatis No date: HAND SURGERY; Bilateral     Comment:  carpal tunnel 2005: HERNIA REPAIR     Comment:  at colostomy site after reversal done No date: JOINT REPLACEMENT 04/20/2017: KNEE ARTHROPLASTY; Right     Comment:  Procedure: COMPUTER ASSISTED TOTAL KNEE ARTHROPLASTY;                 Surgeon: Arlyne Lame, MD;  Location: [facility] ORS;                Service: Orthopedics;  Laterality: Right; 02/03/2015: KNEE ARTHROSCOPY; Right     Comment:  Procedure: ARTHROSCOPY right knee, partial medial               menisectomy, condyle malleolus, patella and femoral;                Surgeon: Arlyne Lame, MD;  Location: [facility] ORS;                Service: Orthopedics;  Laterality: Right; No date: MASTECTOMY; Left     Comment:  partial lumpectomy' No date: NASAL SINUS SURGERY     Comment:  2005 04/26/2018: PORTACATH PLACEMENT; Right     Comment:  Procedure: INSERTION PORT-A-CATH RIGHT;  Surgeon:               Marshall Skeeter, MD;  Location: [facility] ORS;  Service:               General;  Laterality: Right; 1998: SALPINGOOPHORECTOMY No date: status post colectomy No date: TONSILLECTOMY 1978: TUBAL LIGATION 06/28/2012: UPPER GI ENDOSCOPY 1987: VAGINAL HYSTERECTOMY  BMI    Body Mass Index: 36.97 kg/m      Reproductive/Obstetrics negative OB ROS                             Anesthesia Physical Anesthesia Plan  ASA: 3  Anesthesia Plan: General   Post-op Pain Management:    Induction: Intravenous  PONV Risk Score and Plan: Propofol  infusion and TIVA  Airway Management Planned: Natural Airway and Nasal Cannula  Additional Equipment:   Intra-op Plan:   Post-operative Plan:   Informed Consent: I have reviewed the patients History and Physical, chart, labs and discussed the procedure including the risks, benefits and alternatives for the proposed anesthesia with the patient or authorized representative who has indicated his/her understanding and acceptance.     Dental Advisory Given  Plan Discussed with: Anesthesiologist, CRNA and Surgeon  Anesthesia Plan Comments: (Patient consented for risks of anesthesia including but not limited to:  - adverse reactions to medications - risk of airway placement if required - damage to eyes, teeth,  lips or other oral mucosa - nerve damage due to positioning  - sore throat or hoarseness - Damage to heart, brain, nerves, lungs, other parts of body or loss of life  Patient voiced understanding and assent.)       Anesthesia Quick Evaluation

## 2023-05-12 ENCOUNTER — Encounter: Payer: Self-pay | Admitting: Surgery

## 2023-05-12 LAB — SURGICAL PATHOLOGY

## 2023-05-19 DIAGNOSIS — R399 Unspecified symptoms and signs involving the genitourinary system: Secondary | ICD-10-CM | POA: Diagnosis not present

## 2023-05-27 DIAGNOSIS — E039 Hypothyroidism, unspecified: Secondary | ICD-10-CM | POA: Diagnosis not present

## 2023-05-27 DIAGNOSIS — I10 Essential (primary) hypertension: Secondary | ICD-10-CM | POA: Diagnosis not present

## 2023-05-27 DIAGNOSIS — H02534 Eyelid retraction left upper eyelid: Secondary | ICD-10-CM | POA: Diagnosis not present

## 2023-05-27 DIAGNOSIS — E0500 Thyrotoxicosis with diffuse goiter without thyrotoxic crisis or storm: Secondary | ICD-10-CM | POA: Diagnosis not present

## 2023-05-27 DIAGNOSIS — J45909 Unspecified asthma, uncomplicated: Secondary | ICD-10-CM | POA: Diagnosis not present

## 2023-05-27 DIAGNOSIS — E119 Type 2 diabetes mellitus without complications: Secondary | ICD-10-CM | POA: Diagnosis not present

## 2023-05-27 DIAGNOSIS — K219 Gastro-esophageal reflux disease without esophagitis: Secondary | ICD-10-CM | POA: Diagnosis not present

## 2023-06-23 DIAGNOSIS — H04201 Unspecified epiphora, right lacrimal gland: Secondary | ICD-10-CM | POA: Diagnosis not present

## 2023-06-23 DIAGNOSIS — H5789 Other specified disorders of eye and adnexa: Secondary | ICD-10-CM | POA: Diagnosis not present

## 2023-09-01 DIAGNOSIS — H02401 Unspecified ptosis of right eyelid: Secondary | ICD-10-CM | POA: Diagnosis not present

## 2023-10-07 DIAGNOSIS — R7303 Prediabetes: Secondary | ICD-10-CM | POA: Diagnosis not present

## 2023-10-07 DIAGNOSIS — E782 Mixed hyperlipidemia: Secondary | ICD-10-CM | POA: Diagnosis not present

## 2023-10-07 DIAGNOSIS — N1831 Chronic kidney disease, stage 3a: Secondary | ICD-10-CM | POA: Diagnosis not present

## 2023-10-07 DIAGNOSIS — Z79899 Other long term (current) drug therapy: Secondary | ICD-10-CM | POA: Diagnosis not present

## 2023-10-07 DIAGNOSIS — R8290 Unspecified abnormal findings in urine: Secondary | ICD-10-CM | POA: Diagnosis not present

## 2023-10-07 DIAGNOSIS — I10 Essential (primary) hypertension: Secondary | ICD-10-CM | POA: Diagnosis not present

## 2023-10-07 DIAGNOSIS — E039 Hypothyroidism, unspecified: Secondary | ICD-10-CM | POA: Diagnosis not present

## 2023-10-14 DIAGNOSIS — E039 Hypothyroidism, unspecified: Secondary | ICD-10-CM | POA: Diagnosis not present

## 2023-10-14 DIAGNOSIS — I10 Essential (primary) hypertension: Secondary | ICD-10-CM | POA: Diagnosis not present

## 2023-10-14 DIAGNOSIS — E559 Vitamin D deficiency, unspecified: Secondary | ICD-10-CM | POA: Diagnosis not present

## 2023-10-14 DIAGNOSIS — Z1331 Encounter for screening for depression: Secondary | ICD-10-CM | POA: Diagnosis not present

## 2023-10-14 DIAGNOSIS — R7303 Prediabetes: Secondary | ICD-10-CM | POA: Diagnosis not present

## 2023-10-14 DIAGNOSIS — E66812 Obesity, class 2: Secondary | ICD-10-CM | POA: Diagnosis not present

## 2023-10-14 DIAGNOSIS — Z0000 Encounter for general adult medical examination without abnormal findings: Secondary | ICD-10-CM | POA: Diagnosis not present

## 2023-10-14 DIAGNOSIS — G720 Drug-induced myopathy: Secondary | ICD-10-CM | POA: Diagnosis not present

## 2023-10-14 DIAGNOSIS — T466X5A Adverse effect of antihyperlipidemic and antiarteriosclerotic drugs, initial encounter: Secondary | ICD-10-CM | POA: Diagnosis not present

## 2023-10-14 DIAGNOSIS — E782 Mixed hyperlipidemia: Secondary | ICD-10-CM | POA: Diagnosis not present

## 2023-10-14 DIAGNOSIS — Z79899 Other long term (current) drug therapy: Secondary | ICD-10-CM | POA: Diagnosis not present

## 2023-10-27 DIAGNOSIS — R399 Unspecified symptoms and signs involving the genitourinary system: Secondary | ICD-10-CM | POA: Diagnosis not present

## 2023-11-01 ENCOUNTER — Telehealth: Payer: Self-pay | Admitting: *Deleted

## 2023-11-01 NOTE — Telephone Encounter (Signed)
 Patient called and said that he recently went to Dr. Dessa if she has any problems with her breast.  And she knows that he does not work there anymore and wamted to see Dr. Tye and he said that he only does surgery. She called to ask Babara to check her breast or [facility]

## 2023-11-01 NOTE — Telephone Encounter (Signed)
 Pt will see MD on 10/16 for futher eval. Pt states that her left breast has been tender and she feels something.SABRA Pt aware of appt details.

## 2023-11-03 ENCOUNTER — Inpatient Hospital Stay: Attending: Oncology | Admitting: Oncology

## 2023-11-03 ENCOUNTER — Inpatient Hospital Stay

## 2023-11-03 ENCOUNTER — Encounter: Payer: Self-pay | Admitting: Oncology

## 2023-11-03 VITALS — BP 121/87 | HR 97 | Temp 98.0°F | Resp 18 | Wt 229.0 lb

## 2023-11-03 DIAGNOSIS — C50919 Malignant neoplasm of unspecified site of unspecified female breast: Secondary | ICD-10-CM

## 2023-11-03 DIAGNOSIS — N644 Mastodynia: Secondary | ICD-10-CM | POA: Insufficient documentation

## 2023-11-03 DIAGNOSIS — Z9012 Acquired absence of left breast and nipple: Secondary | ICD-10-CM | POA: Diagnosis not present

## 2023-11-03 DIAGNOSIS — Z808 Family history of malignant neoplasm of other organs or systems: Secondary | ICD-10-CM | POA: Insufficient documentation

## 2023-11-03 DIAGNOSIS — Z801 Family history of malignant neoplasm of trachea, bronchus and lung: Secondary | ICD-10-CM | POA: Diagnosis not present

## 2023-11-03 DIAGNOSIS — Z853 Personal history of malignant neoplasm of breast: Secondary | ICD-10-CM | POA: Insufficient documentation

## 2023-11-03 DIAGNOSIS — Z17421 Hormone receptor negative with human epidermal growth factor receptor 2 negative status: Secondary | ICD-10-CM

## 2023-11-03 DIAGNOSIS — Z08 Encounter for follow-up examination after completed treatment for malignant neoplasm: Secondary | ICD-10-CM | POA: Insufficient documentation

## 2023-11-03 DIAGNOSIS — Z800 Family history of malignant neoplasm of digestive organs: Secondary | ICD-10-CM | POA: Insufficient documentation

## 2023-11-03 DIAGNOSIS — R7401 Elevation of levels of liver transaminase levels: Secondary | ICD-10-CM | POA: Diagnosis not present

## 2023-11-03 DIAGNOSIS — Z806 Family history of leukemia: Secondary | ICD-10-CM | POA: Insufficient documentation

## 2023-11-03 NOTE — Assessment & Plan Note (Addendum)
#   Stage IB left triple negative breast cancer, lumpectomy with SLNB, adjuvant chemotherapy followed by adjuvant radiation. Labs reviewed and discussed with patient.  She is more than 5 years post surgery. Breast exam is not remarkable.

## 2023-11-03 NOTE — Assessment & Plan Note (Signed)
 Discussed with patient that breast pain is a common issue after surgery and radiation.  Patient quite anxious and concerned about her symptoms. Check cancer markers.  Obtain left diagnostic mammogram.

## 2023-11-03 NOTE — Assessment & Plan Note (Signed)
 due to fatty liver disease.  Monitor.

## 2023-11-03 NOTE — Progress Notes (Signed)
 Hematology/Oncology Progress note [phone] [phone]     REASON FOR VISIT:  Follow up for adjuvant chemotherapy for breast cancer.    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 Labs reviewed and discussed with patient.  She is more than 5 years post surgery. Breast exam is not remarkable.    Breast pain Discussed with patient that breast pain is a common issue after surgery and radiation.  Patient quite anxious and concerned about her symptoms. Check cancer markers.  Obtain left diagnostic mammogram.  Transaminitis due to fatty liver disease.  Monitor.   Orders Placed This Encounter  Procedures   MM 3D DIAGNOSTIC MAMMOGRAM UNILATERAL LEFT BREAST    Standing Status:   Future    Expected Date:   11/10/2023    Expiration Date:   11/02/2024    Reason for Exam (SYMPTOM  OR DIAGNOSIS REQUIRED):   history of left breast cancer, breast pain    Preferred imaging location?:   [facility]   Cancer antigen 15-3    Standing Status:   Future    Number of Occurrences:   1    Expected Date:   11/03/2023    Expiration Date:   02/01/2024   Cancer antigen 27.29    Standing Status:   Future    Number of Occurrences:   1    Expected Date:   11/03/2023    Expiration Date:   02/01/2024   Keep currently scheduled follow-up appointments. All questions were answered. The patient knows to call the clinic with any problems, questions or concerns.  Call Babara, MD, PhD [facility]  Health Hematology Oncology 11/03/2023     HISTORY OF PRESENTING ILLNESS:  Megan Harris is a  73 y.o.  female present for management of  breast cancer  # Stage IB left triple negative breast cancer   03/27/2018 which showed left breast mass. She had diagnostic and ultrasound on 04/07/2018 which showed 0.9cm x 0.9cm x 1.4cm irregular border mixed echotexture mass at the left breast 11 o'clock 7 cm from nipple correlating to the mammographic finding. Ultrasound of the left axilla is negative She underwent biopsy of left breast mass. 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 Estrogen receptor negative, progesterone receptor negative, HER-2 negative.  S/p left lumpectomy and sentinel lymph node biopsy. pT1pN0 05/09/2018 Baseline MUGA showed no focal wall motion abnormality of the left ventricle.  Calculated LVEF 61%  # 05/24/2018- 07/05/2018 Adjuvant Chemotherapy ddAC- # 07/19/2018 started on weekly Taxol  80 mg/m. Starting cycle 4, Taxol  was decreased to 70mg /m2 # 08/23/2018 Cycle cycle 6, Taxol  was decreased to 65 mg/m due to neuropathy. 10/04/2018 patient finished adjuvant chemotherapy with ddAC x 4 followed by 12 weekly Taxol . 12/13/2018, she finished adjuvant radiation.  #Patient was recommended for genetic testing and she declined. Mediport was discontinued per patient's preference  # 03/29/2019 patient underwent annual diagnostic breast mammogram bilaterally. Mammogram showed new calcifications in the superior lateral right breast  spanning 5 mm. Also there is an abnormal lymph node in the low left axilla representing a change from previous mammogram. 04/03/2019 patient underwent left axillary lymph node biopsy and pathology is negative for malignancy, fibroadipose tissue with necrosis and calcifications, compatible with prior radiation therapy.  Lymph node tissue is not identified. 04/10/2019  breast right upper outer quadrant biopsy showed fibroadenomatoid changes with associated calcifications.  Negative for atypia and malignancy.  Seen by Dr. Dessa recently.  10/23/2019 CT abdomen pelvis was obtained for evaluation of pelvic pain.CT showed mild circumferential bladder wall thickening, she follows with urologist and there is a plan for cystoscopy per patient.  CT also showed hepatic steatosis, 2.1 cm right adnexal cystic mass.  # 11/09/2019, colonoscopy was performed by Dr. Dessa which showed diverticulosis in the rectosigmoid colon.  2 colon polyps was resected and retrieved.   01/21/2020 ultrasound pelvic showed small simple cyst in the right adnexa.  Patient was seen by Dr. Arloa gynecology   patient was diagnosed with thyroid  eye disease, was on Teprotumumab , and now off treatment  [name] who has above history reviewed by me presents for follow-up of stage I left triple negative breast cancer  Discussed the use of AI scribe software for clinical note transcription with the patient, who gave verbal consent to proceed.   Neuropathy on gabapentin    And over-the-counter treatments including Voltaren creams.  She experiences persistent tenderness in the left breast, particularly at the site of previous surgery. This tenderness has been present since her surgery. She is unsure if the current discomfort is due to scar tissue or a recent strain from physical activity. She reports no swelling in the breast area.  She has been very active recently, engaging in physical work such as gardening and deep cleaning, which she suspects might have contributed to the discomfort. The pain is significant enough to prevent her from sleeping on her left side.   Review of Systems  Constitutional:  Negative for chills and fever.  HENT:  Negative for hearing loss.   Respiratory:  Negative for cough.   Cardiovascular:  Negative for chest pain.   Gastrointestinal:  Negative for abdominal pain, blood in stool and nausea.  Genitourinary:  Negative for frequency and urgency.  Skin:  Negative for itching and rash.  Neurological:  Negative for focal weakness.  Psychiatric/Behavioral:  The patient is not nervous/anxious.     MEDICAL HISTORY:  Past Medical History:  Diagnosis Date   Abdominal pain, left lower quadrant 2012   Abnormal LFTs    Anemia    vitamin d  deficiency   Arthritis    Asthma    Back pain 2005   lower back arthritis   Bone cancer (HCC)    Breast cancer (HCC) 04/2018   left invasive mammary   Cancer (HCC) 04/26/2018   16mm, T1c, N0 triple negative   Chronic cholecystitis 2012   has had choleycystectomy.  not an issue   Chronic insomnia    Constipation 2005   COPD (chronic obstructive pulmonary disease) (HCC)    Degenerative disc disease, cervical    Diabetes mellitus without complication (HCC)    Diverticulitis    Endocrine problem 2005   Family history of bone cancer    Family history of brain cancer    Family history of colon cancer    Family history of lung cancer    GERD (gastroesophageal reflux disease) 2012   History of GI diverticular bleed  History of peptic ulcer disease    History of pneumonia    Hyperlipidemia 2012   Hypertension 2002   Nausea with vomiting 2012   Obesity, unspecified 2012   Personal history of chemotherapy    Personal history of radiation therapy 2020   invasive mammary   Pneumonia    PONV (postoperative nausea and vomiting)    Special screening for malignant neoplasms, colon 2012   Vitamin D  deficiency     SURGICAL HISTORY: Past Surgical History:  Procedure Laterality Date   BILATERAL CARPAL TUNNEL RELEASE Bilateral    BREAST BIOPSY Left 04/13/2018   invasive mamm and DCIS   BREAST BIOPSY Right 04/10/2019   calcs bx, ribbon marker, benign    BREAST BIOPSY Left 04/03/2019   LN bx with Dr Dessa, FEATURES COMPATIBLE WITH PRIOR RADIATION THERAPY   BREAST  EXCISIONAL BIOPSY Left early 90s   neg   BREAST LUMPECTOMY Left 04/26/2018   Procedure: BREAST LUMPECTOMY WITH EXCISION OF SENTINEL NODE;  Surgeon: Dessa Reyes ORN, MD;  Location: [facility] ORS;  Service: General;  Laterality: Left;   BUNIONECTOMY Bilateral 2003   CHOLECYSTECTOMY  06/04/2010   COLON RESECTION  2004   due diverticulitis    COLON SURGERY     COLONOSCOPY  2005   [facility], Dr. Dessa   COLONOSCOPY  06/28/2012   COLONOSCOPY N/A 05/11/2023   Procedure: COLONOSCOPY;  Surgeon: Tye Millet, DO;  Location: [facility] END[facility];  Service: General;  Laterality: N/A;   COLONOSCOPY WITH PROPOFOL  N/A 11/09/2019   Procedure: COLONOSCOPY WITH PROPOFOL ;  Surgeon: Dessa Reyes ORN, MD;  Location: [facility] ENDOSCOPY;  Service: Endoscopy;  Laterality: N/A;   COLOSTOMY  2004   COLOSTOMY REVERSAL  2004   FOOT SURGERY Bilateral 2012   plantar faciatis   HAND SURGERY Bilateral    carpal tunnel   HERNIA REPAIR  2005   at colostomy site after reversal done   JOINT REPLACEMENT     KNEE ARTHROPLASTY Right 04/20/2017   Procedure: COMPUTER ASSISTED TOTAL KNEE ARTHROPLASTY;  Surgeon: Mardee Lynwood SQUIBB, MD;  Location: [facility] ORS;  Service: Orthopedics;  Laterality: Right;   KNEE ARTHROSCOPY Right 02/03/2015   Procedure: ARTHROSCOPY right knee, partial medial menisectomy, condyle malleolus, patella and femoral;  Surgeon: Lynwood SQUIBB Mardee, MD;  Location: [facility] ORS;  Service: Orthopedics;  Laterality: Right;   MASTECTOMY Left    partial lumpectomy'   NASAL SINUS SURGERY     2005   POLYPECTOMY  05/11/2023   Procedure: POLYPECTOMY, INTESTINE;  Surgeon: Tye Millet, DO;  Location: [facility] END[facility];  Service: General;;   PORTACATH PLACEMENT Right 04/26/2018   Procedure: INSERTION PORT-A-CATH RIGHT;  Surgeon: Dessa Reyes ORN, MD;  Location: [facility] ORS;  Service: General;  Laterality: Right;   SALPINGOOPHORECTOMY  1998   status post colectomy     TONSILLECTOMY     TUBAL LIGATION  1978   UPPER GI ENDOSCOPY  06/28/2012   VAGINAL  HYSTERECTOMY  1987    SOCIAL HISTORY: Social History   Socioeconomic History   Marital status: Married    Spouse name: Roger   Number of children: 2   Years of education: Not on file   Highest education level: Not on file  Occupational History   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   Overall Financial Resource Strain (CARDIA)    Difficulty of Paying Living Expenses: Not hard at all  Food Insecurity: No Food Insecurity (10/14/2023)   Received from [facility]   Hunger Vital Sign    Within the past 12 months, you worried that your food would run out before you got the money to buy more.: Never true    Within the past 12 months, the food you bought just didn't last and you didn't have money to get more.: Never true  Transportation Needs: No Transportation Needs (10/14/2023)   Received from [facility] - Transportation    In the past 12 months, has lack of transportation kept you from medical appointments or from getting medications?: No    Lack of Transportation (Non-Medical): No  Physical Activity: Not on file  Stress: Not on file  Social Connections: Not on file  Intimate Partner Violence: Not on file    FAMILY HISTORY: Family History  Problem Relation Age of Onset   Heart disease Father    Diabetes Paternal Grandmother    Diabetes Paternal Grandfather    Brain cancer Mother 30   Bone cancer Sister 48   Colon cancer Maternal Uncle        dx 50s-60s   Throat  cancer Cousin    Leukemia Cousin    Lung cancer Cousin    Ovarian cancer Neg Hx    Breast cancer Neg Hx    Prostate cancer Neg Hx    Kidney cancer Neg Hx    Bladder Cancer Neg Hx     ALLERGIES:  is allergic to codeine, penicillins, and sudafed [pseudoephedrine].  MEDICATIONS:  Current Outpatient Medications  Medication Sig Dispense Refill   Calcium  Carbonate-Vit D-Min (CALCIUM  1200 PO) Take by mouth.     gabapentin  (NEURONTIN ) 300 MG capsule Take 3 capsules (900 mg total) by mouth 2 (two) times daily. 180 capsule 5   lansoprazole (PREVACID) 30 MG capsule Take 30 mg by mouth 2 (two) times daily.      loratadine  (CLARITIN ) 10 MG tablet Take 10 mg by mouth daily.     Multiple Vitamins-Minerals (VITAMIN D3 COMPLETE PO) Take by mouth.     Potassium 99 MG TABS Take by mouth daily.     telmisartan-hydrochlorothiazide  (MICARDIS HCT) 80-25 MG tablet Take 1 tablet by mouth daily.     Zinc Sulfate (ZINC 15 PO) Take by mouth.     zolpidem  (AMBIEN ) 10 MG tablet Take 10 mg by mouth at bedtime.     albuterol  (VENTOLIN  HFA) 108 (90 Base) MCG/ACT inhaler Inhale 1-2 puffs into the lungs every 6 (six) hours as needed for wheezing or shortness of breath. (Patient not taking: Reported on 11/03/2023) 1 g 0   amLODipine  (NORVASC ) 5 MG tablet Take 5 mg by mouth daily. (Patient not taking: Reported on 11/03/2023)     atorvastatin  (LIPITOR) 10 MG tablet Take 10 mg by mouth daily. (Patient not taking: Reported on 11/03/2023)     ezetimibe (ZETIA) 10 MG tablet Take 10 mg by mouth daily.     levothyroxine (SYNTHROID, LEVOTHROID) 50 MCG tablet Take 50 mcg by mouth daily  before breakfast.  (Patient not taking: Reported on 11/03/2023)     metFORMIN (GLUCOPHAGE) 500 MG tablet TAKE 1 TABLET BY MOUTH 2 TIMES DAILY WITH MEALS. (Patient not taking: Reported on 11/03/2023)     No current facility-administered medications for this visit.     PHYSICAL EXAMINATION: ECOG PERFORMANCE STATUS: 1 - Symptomatic but completely  ambulatory Vitals:   11/03/23 1348  BP: 121/87  Pulse: 97  Resp: 18  Temp: 98 F (36.7 C)  SpO2: 97%   Filed Weights   11/03/23 1348  Weight: 229 lb (103.9 kg)    Physical Exam Constitutional:      General: She is not in acute distress.    Appearance: She is obese.  HENT:     Head: Normocephalic and atraumatic.  Eyes:     General: No scleral icterus. Cardiovascular:     Rate and Rhythm: Normal rate and regular rhythm.     Heart sounds: Normal heart sounds.  Pulmonary:     Effort: Pulmonary effort is normal. No respiratory distress.     Breath sounds: Normal breath sounds. No wheezing.  Abdominal:     General: Bowel sounds are normal. There is no distension.     Palpations: Abdomen is soft.  Musculoskeletal:        General: Normal range of motion.     Cervical back: Normal range of motion and neck supple.  Skin:    General: Skin is warm and dry.     Findings: No erythema or rash.  Neurological:     Mental Status: She is alert and oriented to person, place, and time. Mental status is at baseline.  Psychiatric:        Mood and Affect: Mood normal.   Breast exam was performed in seated and lying down position. Left breast lumpectomy with a well-healed surgical scar. She has some tenderness and focal tissue thickening at previous surgical scar No palpable breast mass bilaterally. No palpable axillary lymphadenopathy.   LABORATORY DATA:  I have reviewed the data as listed    Latest Ref Rng & Units 04/25/2023    9:47 AM 10/18/2022   10:46 AM 04/07/2022   11:01 AM  CBC  WBC 4.0 - 10.5 K/uL 6.8  5.6  6.1   Hemoglobin 12.0 - 15.0 g/dL 87.2  87.1  86.7   Hematocrit 36.0 - 46.0 % 39.1  39.0  40.6   Platelets 150 - 400 K/uL 213  175  214        Latest Ref Rng & Units 04/25/2023    9:47 AM 10/18/2022   10:46 AM 04/07/2022   11:01 AM  CMP  Glucose 70 - 99 mg/dL 843  769  848   BUN 8 - 23 mg/dL 28  25  24    Creatinine 0.44 - 1.00 mg/dL 8.88  8.86  8.89   Sodium 135 - 145  mmol/L 134  136  138   Potassium 3.5 - 5.1 mmol/L 3.7  3.7  4.3   Chloride 98 - 111 mmol/L 97  94  99   CO2 22 - 32 mmol/L 27  28  28    Calcium  8.9 - 10.3 mg/dL 9.0  9.4  9.3   Total Protein 6.5 - 8.1 g/dL 7.0  6.9  7.3   Total Bilirubin 0.0 - 1.2 mg/dL 0.8  0.4  0.9   Alkaline Phos 38 - 126 U/L 47  42  56   AST 15 - 41 U/L 36  60  64  ALT 0 - 44 U/L 38  69  61       RADIOGRAPHIC STUDIES: I have personally reviewed the radiological images as listed and agreed with the findings in the report. No results found.

## 2023-11-04 LAB — CANCER ANTIGEN 27.29: CA 27.29: 32.3 U/mL (ref 0.0–38.6)

## 2023-11-04 LAB — CANCER ANTIGEN 15-3: CA 15-3: 26 U/mL — ABNORMAL HIGH (ref 0.0–25.0)

## 2023-11-09 ENCOUNTER — Other Ambulatory Visit: Payer: Self-pay | Admitting: Oncology

## 2023-11-09 DIAGNOSIS — N644 Mastodynia: Secondary | ICD-10-CM

## 2023-11-09 DIAGNOSIS — C50919 Malignant neoplasm of unspecified site of unspecified female breast: Secondary | ICD-10-CM

## 2023-11-10 ENCOUNTER — Ambulatory Visit
Admission: RE | Admit: 2023-11-10 | Discharge: 2023-11-10 | Disposition: A | Discharge status: Home / Self Care | Source: Ambulatory Visit | Attending: Oncology | Admitting: Oncology

## 2023-11-10 DIAGNOSIS — Z17421 Hormone receptor negative with human epidermal growth factor receptor 2 negative status: Secondary | ICD-10-CM | POA: Diagnosis not present

## 2023-11-10 DIAGNOSIS — Z9221 Personal history of antineoplastic chemotherapy: Secondary | ICD-10-CM | POA: Diagnosis not present

## 2023-11-10 DIAGNOSIS — N644 Mastodynia: Secondary | ICD-10-CM

## 2023-11-10 DIAGNOSIS — C50919 Malignant neoplasm of unspecified site of unspecified female breast: Secondary | ICD-10-CM | POA: Diagnosis not present

## 2023-11-10 DIAGNOSIS — R921 Mammographic calcification found on diagnostic imaging of breast: Secondary | ICD-10-CM | POA: Diagnosis not present

## 2023-11-10 DIAGNOSIS — C50912 Malignant neoplasm of unspecified site of left female breast: Secondary | ICD-10-CM | POA: Diagnosis not present

## 2023-11-13 ENCOUNTER — Ambulatory Visit: Payer: Self-pay | Admitting: Oncology

## 2023-11-13 DIAGNOSIS — C50919 Malignant neoplasm of unspecified site of unspecified female breast: Secondary | ICD-10-CM

## 2023-11-14 DIAGNOSIS — R002 Palpitations: Secondary | ICD-10-CM | POA: Diagnosis not present

## 2023-11-14 DIAGNOSIS — R0789 Other chest pain: Secondary | ICD-10-CM | POA: Diagnosis not present

## 2023-11-14 DIAGNOSIS — E782 Mixed hyperlipidemia: Secondary | ICD-10-CM | POA: Diagnosis not present

## 2023-11-14 DIAGNOSIS — I10 Essential (primary) hypertension: Secondary | ICD-10-CM | POA: Diagnosis not present

## 2023-11-14 DIAGNOSIS — E118 Type 2 diabetes mellitus with unspecified complications: Secondary | ICD-10-CM | POA: Diagnosis not present

## 2023-11-14 NOTE — Telephone Encounter (Signed)
-----   Message from Zelphia Cap sent at 11/13/2023 11:23 AM EDT ----- Please let patient know that her tumor marker is slightly elevated, this is not specific. Recommend to repeat tumor marker in 6 weeks. Lab CA 15.3 thanks.  ----- Message ----- From: Rebecka, Lab In [place] Sent: 11/04/2023   6:36 AM EDT To: Zelphia Cap, MD

## 2023-11-14 NOTE — Telephone Encounter (Signed)
 Please schedule and notify pt of appts:   Labs in 6 weeks

## 2023-11-16 ENCOUNTER — Inpatient Hospital Stay: Admitting: Occupational Therapy

## 2023-11-16 DIAGNOSIS — N644 Mastodynia: Secondary | ICD-10-CM

## 2023-11-16 NOTE — Therapy (Signed)
 [facility] - A Dept Of [place]. [facility] [address] [place], [place], [address] Phone: [phone]   Fax:  [phone]  Occupational Therapy Screen  Patient Details  Name: Megan Harris MRN: [id number] Date of Birth: 1951-01-04 No data recorded  Encounter Date: 11/16/2023   OT End of Session - 11/16/23 1459     Visit Number 0          Past Medical History:  Diagnosis Date   Abdominal pain, left lower quadrant 2012   Abnormal LFTs    Anemia    vitamin d  deficiency   Arthritis    Asthma    Back pain 2005   lower back arthritis   Bone cancer (HCC)    Breast cancer (HCC) 04/2018   left invasive mammary   Cancer (HCC) 04/26/2018   16mm, T1c, N0 triple negative   Chronic cholecystitis 2012   has had choleycystectomy.  not an issue   Chronic insomnia    Constipation 2005   COPD (chronic obstructive pulmonary disease) (HCC)    Degenerative disc disease, cervical    Diabetes mellitus without complication (HCC)    Diverticulitis    Endocrine problem 2005   Family history of bone cancer    Family history of brain cancer    Family history of colon cancer    Family history of lung cancer    GERD (gastroesophageal reflux disease) 2012   History of GI diverticular bleed    History of peptic ulcer disease    History of pneumonia    Hyperlipidemia 2012   Hypertension 2002   Nausea with vomiting 2012   Obesity, unspecified 2012   Personal history of chemotherapy    Personal history of radiation therapy 2020   invasive mammary   Pneumonia    PONV (postoperative nausea and vomiting)    Special screening for malignant neoplasms, colon 2012   Vitamin D  deficiency     Past Surgical History:  Procedure Laterality Date   BILATERAL CARPAL TUNNEL RELEASE Bilateral    BREAST BIOPSY Left 04/13/2018   invasive mamm and DCIS   BREAST BIOPSY Right 04/10/2019   calcs bx, ribbon marker, benign    BREAST BIOPSY Left  04/03/2019   LN bx with Dr Dessa, FEATURES COMPATIBLE WITH PRIOR RADIATION THERAPY   BREAST EXCISIONAL BIOPSY Left early 90s   neg   BREAST LUMPECTOMY Left 04/26/2018   Procedure: BREAST LUMPECTOMY WITH EXCISION OF SENTINEL NODE;  Surgeon: Dessa Reyes LELON, MD;  Location: [facility] ORS;  Service: General;  Laterality: Left;   BUNIONECTOMY Bilateral 2003   CHOLECYSTECTOMY  06/04/2010   COLON RESECTION  2004   due diverticulitis    COLON SURGERY     COLONOSCOPY  2005   [facility], Dr. Dessa   COLONOSCOPY  06/28/2012   COLONOSCOPY N/A 05/11/2023   Procedure: COLONOSCOPY;  Surgeon: Tye Millet, DO;  Location: [facility] END[facility];  Service: General;  Laterality: N/A;   COLONOSCOPY WITH PROPOFOL  N/A 11/09/2019   Procedure: COLONOSCOPY WITH PROPOFOL ;  Surgeon: Dessa Reyes LELON, MD;  Location: [facility] END[facility];  Service: Endoscopy;  Laterality: N/A;   COLOSTOMY  2004   COLOSTOMY REVERSAL  2004   FOOT SURGERY Bilateral 2012   plantar faciatis   HAND SURGERY Bilateral    carpal tunnel   HERNIA REPAIR  2005   at colostomy site after reversal done   JOINT REPLACEMENT     KNEE ARTHROPLASTY Right 04/20/2017  Procedure: COMPUTER ASSISTED TOTAL KNEE ARTHROPLASTY;  Surgeon: Mardee Lynwood SQUIBB, MD;  Location: [facility];  Service: Orthopedics;  Laterality: Right;   KNEE ARTHROSCOPY Right 02/03/2015   Procedure: ARTHROSCOPY right knee, partial medial menisectomy, condyle malleolus, patella and femoral;  Surgeon: Lynwood SQUIBB Mardee, MD;  Location: [facility];  Service: Orthopedics;  Laterality: Right;   MASTECTOMY Left    partial lumpectomy'   NASAL SINUS SURGERY     2005   POLYPECTOMY  05/11/2023   Procedure: POLYPECTOMY, INTESTINE;  Surgeon: Tye Millet, DO;  Location: [facility];  Service: General;;   PORTACATH PLACEMENT Right 04/26/2018   Procedure: INSERTION PORT-A-CATH RIGHT;  Surgeon: Dessa Reyes ORN, MD;  Location: [facility];  Service: General;  Laterality: Right;   SALPINGOOPHORECTOMY  1998   status post  colectomy     TONSILLECTOMY     TUBAL LIGATION  1978   UPPER GI ENDOSCOPY  06/28/2012   VAGINAL HYSTERECTOMY  1987    There were no vitals filed for this visit.        LYMPHEDEMA/ONCOLOGY QUESTIONNAIRE - 11/16/23 0001       Right Upper Extremity Lymphedema   15 cm Proximal to Olecranon Process 38 cm    10 cm Proximal to Olecranon Process 33 cm    Olecranon Process 29 cm      Left Upper Extremity Lymphedema   15 cm Proximal to Olecranon Process 35.5 cm    10 cm Proximal to Olecranon Process 32.3 cm    Olecranon Process 28.4 cm        Dr Babara 11/03/23 note HISTORY OF PRESENTING ILLNESS:  Megan Harris is a  73 y.o.  female present for management of  breast cancer   # Stage IB left triple negative breast cancer   03/27/2018 which showed left breast mass. She had diagnostic and ultrasound on 04/07/2018 which showed 0.9cm x 0.9cm x 1.4cm irregular border mixed echotexture mass at the left breast 11 o'clock 7 cm from nipple correlating to the mammographic finding. Ultrasound of the left axilla is negative She underwent biopsy of left breast mass. 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 Estrogen receptor negative, progesterone receptor negative, HER-2 negative.   S/p left lumpectomy and sentinel lymph node biopsy. pT1pN0 05/09/2018 Baseline MUGA showed no focal wall motion abnormality of the left ventricle.  Calculated LVEF 61%   # 05/24/2018- 07/05/2018 Adjuvant Chemotherapy ddAC- # 07/19/2018 started on weekly Taxol  80 mg/m. Starting cycle 4, Taxol  was decreased to 70mg /m2 # 08/23/2018 Cycle cycle 6, Taxol  was decreased to 65 mg/m due to neuropathy. 10/04/2018 patient finished adjuvant chemotherapy with ddAC x 4 followed by 12 weekly Taxol . 12/13/2018, she finished adjuvant radiation.   #Patient was recommended for genetic testing and she declined. Mediport was discontinued per  patient's preference   # 03/29/2019 patient underwent annual diagnostic breast mammogram bilaterally. Mammogram showed new calcifications in the superior lateral right breast spanning 5 mm. Also there is an abnormal lymph node in the low left axilla representing a change from previous mammogram. 04/03/2019 patient underwent left axillary lymph node biopsy and pathology is negative for malignancy, fibroadipose tissue with necrosis and calcifications, compatible with prior radiation therapy.  Lymph node tissue is not identified. 04/10/2019 breast right upper outer quadrant biopsy showed fibroadenomatoid changes with associated calcifications.  Negative for atypia and malignancy.   Seen by Dr. Dessa recently.  10/23/2019 CT abdomen pelvis was obtained for evaluation of  pelvic pain.CT showed mild circumferential bladder wall thickening, she follows with urologist and there is a plan for cystoscopy per patient.  CT also showed hepatic steatosis, 2.1 cm right adnexal cystic mass.   # 11/09/2019, colonoscopy was performed by Dr. Dessa which showed diverticulosis in the rectosigmoid colon.  2 colon polyps was resected and retrieved.   01/21/2020 ultrasound pelvic showed small simple cyst in the right adnexa.  Patient was seen by Dr. Arloa gynecology    patient was diagnosed with thyroid  eye disease, was on Teprotumumab , and now off treatment   [name] who has above history reviewed by me presents for follow-up of stage I left triple negative breast cancer   Discussed the use of AI scribe software for clinical note transcription with the patient, who gave verbal consent to proceed.    Neuropathy on gabapentin    And over-the-counter treatments including Voltaren creams.   She experiences persistent tenderness in the left breast, particularly at the site of previous surgery. This tenderness has been present since her surgery. She is unsure if the current  discomfort is due to scar tissue or a recent strain from physical activity. She reports no swelling in the breast area.   She has been very active recently, engaging in physical work such as gardening and deep cleaning, which she suspects might have contributed to the discomfort. The pain is significant enough to prevent her from sleeping on her left side.    Refer to OT    OT screen 11/16/23:   Patient rife with reports of increased left breast pain since 3 weeks ago.  Patient report is improving.  But still tender on the lateral breast and superior breast.  3/10.  Patient had 5 years ago lumpectomy by Dr. Linnie followed by radiation and chemo. 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 She do report around that time she done a deep cleaning on her house as well as yard work. Patient upon assessment do have some increased scar tissue with fibrosis on the lateral breast above the nipple and around the scars.  Increased tenderness. Fabricated chip bag for her to use daily or at nighttime in her bra or camisole 3 to 4 hours or to tolerance to decrease fibrosis and scar tissue.  She can do that for about 2 weeks and then attempt some scar mobilization and soft tissue mobilization. Patient bilateral circumference within normal limits.  No lymphedema signs and symptoms Did review some precautions and prevention.  Patient aware of it.   LYMPHEDEMA/ONCOLOGY QUESTIONNAIRE - 11/16/23 0001       Right Upper Extremity Lymphedema   15 cm Proximal to Olecranon Process 38 cm    10 cm Proximal to Olecranon Process 33 cm    Olecranon Process 29 cm      Left Upper Extremity Lymphedema   15 cm Proximal to Olecranon Process 35.5 cm    10 cm Proximal to Olecranon Process 32.3 cm    Olecranon Process 28.4 cm          If needed patient can follow-up with me in 4 weeks.                         Visit Diagnosis: Breast pain    Problem List Patient  Active Problem List   Diagnosis Date Noted   Breast pain 11/03/2023   Chest pain 02/27/2021  Transaminitis 07/13/2019   Neuropathy 07/13/2019   Mucositis 07/05/2018   Hypomagnesemia 07/05/2018   Encounter for antineoplastic chemotherapy 07/05/2018   Dehydration 06/15/2018   Goals of care, counseling/discussion 05/25/2018   Family history of brain cancer    Family history of bone cancer    Family history of colon cancer    Family history of lung cancer    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 (HCC) 04/29/2018   Asthma without status asthmaticus 04/20/2017   Benign essential HTN 04/20/2017   Chronic insomnia 04/20/2017   Degenerative disc disease, cervical 04/20/2017   History of GI diverticular bleed 04/20/2017   History of peptic ulcer disease 04/20/2017   Hyperlipidemia, unspecified 04/20/2017   Vitamin D  deficiency, unspecified 04/20/2017   S/P total knee arthroplasty 04/20/2017   Primary osteoarthritis of right knee 04/10/2017   Sciatica of left side 03/17/2017   Trochanteric bursitis of left hip 03/17/2017   Status post bilateral salpingo-oophorectomy (BSO) 03/02/2016   Obesity, Class III, BMI 40-49.9 (morbid obesity) (HCC) 03/02/2016   Urinary urgency 03/02/2016   Urinary frequency 03/02/2016   Vulvar lesion 03/02/2016   Status post vaginal hysterectomy 03/02/2016   Surgical menopause 03/02/2016   Dyspareunia, female 03/02/2016   Vaginal atrophy 03/02/2016   Mixed stress and urge urinary incontinence 03/02/2016   Morbid obesity with BMI of 40.0-44.9, adult (HCC) 06/10/2015   Nausea alone 06/19/2012   Encounter for screening colonoscopy for non-high-risk patient 04/27/2012    Ancel Peters, OT 11/16/2023, 3:03 PM  [facility] - [facility] Dept Of [place]. [facility] [address], [address] [place], [place], [address] Phone: [phone]   Fax:  [phone]  Name: Megan Harris MRN: [id number] Date of Birth:  [birth year]

## 2023-11-21 DIAGNOSIS — R0789 Other chest pain: Secondary | ICD-10-CM | POA: Diagnosis not present

## 2023-11-21 DIAGNOSIS — R002 Palpitations: Secondary | ICD-10-CM | POA: Diagnosis not present

## 2023-12-07 DIAGNOSIS — R002 Palpitations: Secondary | ICD-10-CM | POA: Diagnosis not present

## 2023-12-13 ENCOUNTER — Telehealth: Payer: Self-pay | Admitting: *Deleted

## 2023-12-13 DIAGNOSIS — I10 Essential (primary) hypertension: Secondary | ICD-10-CM

## 2023-12-13 NOTE — Progress Notes (Signed)
 Complex Care Management Note Care Guide Note  12/13/2023 Name: Megan Harris MRN: [id number] DOB: [birth year]   Complex Care Management Outreach Attempts: An unsuccessful telephone outreach was attempted today to offer the patient information about available complex care management services.  Follow Up Plan:  Additional outreach attempts will be made to offer the patient complex care management information and services.   Encounter Outcome:  No Answer  Thedford Franks, CMA [place]  [facility], [facility] Guide Direct Dial: [phone]  Fax: [phone] Website: [email or web address]

## 2023-12-14 DIAGNOSIS — E782 Mixed hyperlipidemia: Secondary | ICD-10-CM | POA: Diagnosis not present

## 2023-12-14 DIAGNOSIS — R079 Chest pain, unspecified: Secondary | ICD-10-CM | POA: Diagnosis not present

## 2023-12-14 DIAGNOSIS — I10 Essential (primary) hypertension: Secondary | ICD-10-CM | POA: Diagnosis not present

## 2023-12-14 DIAGNOSIS — R002 Palpitations: Secondary | ICD-10-CM | POA: Diagnosis not present

## 2023-12-14 DIAGNOSIS — E118 Type 2 diabetes mellitus with unspecified complications: Secondary | ICD-10-CM | POA: Diagnosis not present

## 2023-12-19 NOTE — Progress Notes (Unsigned)
 Complex Care Management Note Care Guide Note  12/19/2023 Name: Megan Harris MRN: [id number] DOB: 08-22-1950   Complex Care Management Outreach Attempts: A second unsuccessful outreach was attempted today to offer the patient with information about available complex care management services.  Follow Up Plan:  Additional outreach attempts will be made to offer the patient complex care management information and services.   Encounter Outcome:  No Answer  Thedford Franks, CMA [place]  [facility], [facility] Guide Direct Dial: [phone]  Fax: [phone] Website: [email or web address]

## 2023-12-21 NOTE — Progress Notes (Signed)
 Complex Care Management Note Care Guide Note  12/21/2023 Name: Megan Harris MRN: [id number] DOB: [birth year]   Complex Care Management Outreach Attempts: A third unsuccessful outreach was attempted today to offer the patient with information about available complex care management services.  Follow Up Plan:  No further outreach attempts will be made at this time. We have been unable to contact the patient to offer or enroll patient in complex care management services.  Encounter Outcome:  No Answer  Thedford Franks, CMA [place]  [facility], [facility] Guide Direct Dial: [phone]  Fax: [phone] Website: [email or web address]

## 2023-12-23 ENCOUNTER — Telehealth: Payer: Self-pay | Admitting: Oncology

## 2023-12-23 NOTE — Telephone Encounter (Signed)
 Ok to cancel if pt prefers.

## 2023-12-23 NOTE — Telephone Encounter (Signed)
 Pt called and stated she is doing so much better and wants to know if it is okay to cancel her SCREEN appt with [place] on 12/10. Please advise and call pt back to let her know. Thank you

## 2023-12-26 ENCOUNTER — Inpatient Hospital Stay: Attending: Oncology

## 2023-12-28 ENCOUNTER — Inpatient Hospital Stay: Admitting: Occupational Therapy

## 2024-04-30 ENCOUNTER — Other Ambulatory Visit

## 2024-05-04 ENCOUNTER — Ambulatory Visit: Admitting: Oncology
